# Patient Record
Sex: Female | Born: 1935 | Race: White | Hispanic: No | Marital: Single | Smoking: Former smoker
Health system: Southern US, Community
[De-identification: ages and names within clinical notes are randomized; demographics above are authoritative.]

## PROBLEM LIST (undated history)

## (undated) DIAGNOSIS — I272 Pulmonary hypertension, unspecified: Secondary | ICD-10-CM

## (undated) DIAGNOSIS — I509 Heart failure, unspecified: Secondary | ICD-10-CM

## (undated) DIAGNOSIS — I2510 Atherosclerotic heart disease of native coronary artery without angina pectoris: Secondary | ICD-10-CM

## (undated) DIAGNOSIS — I209 Angina pectoris, unspecified: Secondary | ICD-10-CM

## (undated) DIAGNOSIS — Z8719 Personal history of other diseases of the digestive system: Secondary | ICD-10-CM

## (undated) DIAGNOSIS — R0600 Dyspnea, unspecified: Secondary | ICD-10-CM

## (undated) DIAGNOSIS — I639 Cerebral infarction, unspecified: Secondary | ICD-10-CM

## (undated) DIAGNOSIS — J439 Emphysema, unspecified: Secondary | ICD-10-CM

## (undated) DIAGNOSIS — J189 Pneumonia, unspecified organism: Secondary | ICD-10-CM

## (undated) DIAGNOSIS — I739 Peripheral vascular disease, unspecified: Secondary | ICD-10-CM

## (undated) DIAGNOSIS — E039 Hypothyroidism, unspecified: Secondary | ICD-10-CM

## (undated) DIAGNOSIS — R011 Cardiac murmur, unspecified: Secondary | ICD-10-CM

## (undated) DIAGNOSIS — Z9981 Dependence on supplemental oxygen: Secondary | ICD-10-CM

## (undated) DIAGNOSIS — Z9289 Personal history of other medical treatment: Secondary | ICD-10-CM

## (undated) DIAGNOSIS — D649 Anemia, unspecified: Secondary | ICD-10-CM

## (undated) DIAGNOSIS — R609 Edema, unspecified: Secondary | ICD-10-CM

## (undated) DIAGNOSIS — I10 Essential (primary) hypertension: Secondary | ICD-10-CM

## (undated) DIAGNOSIS — C50919 Malignant neoplasm of unspecified site of unspecified female breast: Secondary | ICD-10-CM

## (undated) DIAGNOSIS — J449 Chronic obstructive pulmonary disease, unspecified: Secondary | ICD-10-CM

## (undated) DIAGNOSIS — M1990 Unspecified osteoarthritis, unspecified site: Secondary | ICD-10-CM

## (undated) DIAGNOSIS — K31819 Angiodysplasia of stomach and duodenum without bleeding: Secondary | ICD-10-CM

## (undated) HISTORY — DX: Atherosclerotic heart disease of native coronary artery without angina pectoris: I25.10

## (undated) HISTORY — DX: Cerebral infarction, unspecified: I63.9

## (undated) HISTORY — DX: Hypothyroidism, unspecified: E03.9

## (undated) HISTORY — DX: Malignant neoplasm of unspecified site of unspecified female breast: C50.919

## (undated) HISTORY — DX: Essential (primary) hypertension: I10

## (undated) HISTORY — DX: Chronic obstructive pulmonary disease, unspecified: J44.9

## (undated) HISTORY — DX: Pulmonary hypertension, unspecified: I27.20

## (undated) HISTORY — DX: Dyspnea, unspecified: R06.00

## (undated) HISTORY — PX: CARDIAC CATHETERIZATION: SHX172

## (undated) HISTORY — DX: Edema, unspecified: R60.9

## (undated) HISTORY — DX: Peripheral vascular disease, unspecified: I73.9

---
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
## 2000-09-16 HISTORY — PX: BREAST LUMPECTOMY: SHX2

## 2003-09-17 HISTORY — PX: CAROTID ENDARTERECTOMY: SUR193

## 2004-08-01 ENCOUNTER — Ambulatory Visit: Payer: Self-pay | Admitting: Internal Medicine

## 2004-08-21 ENCOUNTER — Other Ambulatory Visit: Payer: Self-pay

## 2004-08-21 ENCOUNTER — Inpatient Hospital Stay: Payer: Self-pay

## 2004-08-23 ENCOUNTER — Ambulatory Visit: Payer: Self-pay | Admitting: Internal Medicine

## 2004-09-04 ENCOUNTER — Ambulatory Visit: Payer: Self-pay | Admitting: Family Medicine

## 2004-09-09 ENCOUNTER — Inpatient Hospital Stay: Payer: Self-pay | Admitting: Anesthesiology

## 2004-09-09 ENCOUNTER — Other Ambulatory Visit: Payer: Self-pay

## 2004-09-19 ENCOUNTER — Encounter: Payer: Self-pay | Admitting: Internal Medicine

## 2005-01-31 ENCOUNTER — Ambulatory Visit: Payer: Self-pay | Admitting: Internal Medicine

## 2005-02-14 ENCOUNTER — Ambulatory Visit: Payer: Self-pay | Admitting: Internal Medicine

## 2005-06-03 ENCOUNTER — Ambulatory Visit: Payer: Self-pay | Admitting: General Surgery

## 2005-08-01 ENCOUNTER — Ambulatory Visit: Payer: Self-pay | Admitting: Internal Medicine

## 2005-08-06 ENCOUNTER — Ambulatory Visit: Payer: Self-pay | Admitting: Unknown Physician Specialty

## 2005-08-16 ENCOUNTER — Ambulatory Visit: Payer: Self-pay | Admitting: Internal Medicine

## 2005-09-16 HISTORY — PX: THYROIDECTOMY: SHX17

## 2006-01-03 ENCOUNTER — Ambulatory Visit: Payer: Self-pay

## 2006-01-29 ENCOUNTER — Ambulatory Visit: Payer: Self-pay | Admitting: Internal Medicine

## 2006-02-04 ENCOUNTER — Other Ambulatory Visit: Payer: Self-pay

## 2006-02-04 ENCOUNTER — Ambulatory Visit: Payer: Self-pay | Admitting: Unknown Physician Specialty

## 2006-02-11 ENCOUNTER — Ambulatory Visit: Payer: Self-pay | Admitting: Unknown Physician Specialty

## 2006-06-10 ENCOUNTER — Ambulatory Visit: Payer: Self-pay | Admitting: General Surgery

## 2006-08-01 ENCOUNTER — Ambulatory Visit: Payer: Self-pay | Admitting: Internal Medicine

## 2006-08-16 ENCOUNTER — Ambulatory Visit: Payer: Self-pay | Admitting: Internal Medicine

## 2007-06-16 ENCOUNTER — Ambulatory Visit: Payer: Self-pay | Admitting: Family Medicine

## 2007-08-17 ENCOUNTER — Ambulatory Visit: Payer: Self-pay | Admitting: Internal Medicine

## 2007-08-18 ENCOUNTER — Ambulatory Visit: Payer: Self-pay | Admitting: Internal Medicine

## 2007-08-28 ENCOUNTER — Ambulatory Visit: Payer: Self-pay | Admitting: Internal Medicine

## 2007-09-16 ENCOUNTER — Other Ambulatory Visit: Payer: Self-pay

## 2007-09-16 ENCOUNTER — Inpatient Hospital Stay: Payer: Self-pay | Admitting: Internal Medicine

## 2007-09-17 ENCOUNTER — Ambulatory Visit: Payer: Self-pay | Admitting: Internal Medicine

## 2007-09-19 ENCOUNTER — Ambulatory Visit: Payer: Self-pay | Admitting: Internal Medicine

## 2008-06-20 ENCOUNTER — Ambulatory Visit: Payer: Self-pay | Admitting: Family Medicine

## 2008-07-29 ENCOUNTER — Ambulatory Visit: Payer: Self-pay | Admitting: Internal Medicine

## 2008-08-01 ENCOUNTER — Inpatient Hospital Stay: Payer: Self-pay | Admitting: Internal Medicine

## 2008-08-15 ENCOUNTER — Telehealth (INDEPENDENT_AMBULATORY_CARE_PROVIDER_SITE_OTHER): Payer: Self-pay | Admitting: *Deleted

## 2008-08-18 ENCOUNTER — Ambulatory Visit: Payer: Self-pay | Admitting: Emergency Medicine

## 2008-08-18 DIAGNOSIS — J45909 Unspecified asthma, uncomplicated: Secondary | ICD-10-CM | POA: Insufficient documentation

## 2008-08-18 DIAGNOSIS — J449 Chronic obstructive pulmonary disease, unspecified: Secondary | ICD-10-CM | POA: Insufficient documentation

## 2008-08-18 DIAGNOSIS — J309 Allergic rhinitis, unspecified: Secondary | ICD-10-CM

## 2008-08-18 DIAGNOSIS — I6789 Other cerebrovascular disease: Secondary | ICD-10-CM

## 2008-08-18 DIAGNOSIS — I10 Essential (primary) hypertension: Secondary | ICD-10-CM | POA: Insufficient documentation

## 2008-08-18 DIAGNOSIS — Z853 Personal history of malignant neoplasm of breast: Secondary | ICD-10-CM

## 2008-08-18 LAB — CONVERTED CEMR LAB
Calcium: 8.7 mg/dL (ref 8.4–10.5)
GFR calc Af Amer: 105 mL/min
GFR calc non Af Amer: 87 mL/min
Glucose, Bld: 163 mg/dL — ABNORMAL HIGH (ref 70–99)
Potassium: 4.6 meq/L (ref 3.5–5.1)
Sodium: 141 meq/L (ref 135–145)

## 2008-09-27 ENCOUNTER — Ambulatory Visit: Payer: Self-pay

## 2008-09-27 ENCOUNTER — Encounter: Payer: Self-pay | Admitting: Emergency Medicine

## 2008-09-30 ENCOUNTER — Ambulatory Visit: Payer: Self-pay | Admitting: Internal Medicine

## 2008-11-04 ENCOUNTER — Ambulatory Visit: Payer: Self-pay | Admitting: Emergency Medicine

## 2008-11-22 ENCOUNTER — Ambulatory Visit: Payer: Self-pay

## 2008-11-23 ENCOUNTER — Encounter: Payer: Self-pay | Admitting: Emergency Medicine

## 2008-11-25 ENCOUNTER — Ambulatory Visit: Payer: Self-pay | Admitting: Emergency Medicine

## 2009-02-07 DIAGNOSIS — J438 Other emphysema: Secondary | ICD-10-CM | POA: Insufficient documentation

## 2009-02-07 DIAGNOSIS — I739 Peripheral vascular disease, unspecified: Secondary | ICD-10-CM

## 2009-02-07 DIAGNOSIS — E039 Hypothyroidism, unspecified: Secondary | ICD-10-CM | POA: Insufficient documentation

## 2009-02-07 DIAGNOSIS — R0602 Shortness of breath: Secondary | ICD-10-CM | POA: Insufficient documentation

## 2009-02-07 DIAGNOSIS — I6529 Occlusion and stenosis of unspecified carotid artery: Secondary | ICD-10-CM

## 2009-02-08 ENCOUNTER — Ambulatory Visit: Payer: Self-pay | Admitting: Emergency Medicine

## 2009-02-09 ENCOUNTER — Encounter: Payer: Self-pay | Admitting: Internal Medicine

## 2009-02-09 ENCOUNTER — Ambulatory Visit: Payer: Self-pay | Admitting: Internal Medicine

## 2009-02-09 DIAGNOSIS — R609 Edema, unspecified: Secondary | ICD-10-CM | POA: Insufficient documentation

## 2009-02-09 DIAGNOSIS — R9431 Abnormal electrocardiogram [ECG] [EKG]: Secondary | ICD-10-CM

## 2009-02-12 ENCOUNTER — Encounter: Payer: Self-pay | Admitting: Emergency Medicine

## 2009-03-23 ENCOUNTER — Ambulatory Visit: Payer: Medicare Other | Admitting: Internal Medicine

## 2009-05-18 ENCOUNTER — Ambulatory Visit: Payer: Self-pay | Admitting: Internal Medicine

## 2009-05-19 LAB — CONVERTED CEMR LAB
BUN: 14 mg/dL (ref 6–23)
Chloride: 101 meq/L (ref 96–112)
Potassium: 4.2 meq/L (ref 3.5–5.3)
Sodium: 143 meq/L (ref 135–145)

## 2009-05-25 ENCOUNTER — Ambulatory Visit: Payer: Self-pay | Admitting: Cardiology

## 2009-05-25 ENCOUNTER — Ambulatory Visit (HOSPITAL_COMMUNITY): Admission: RE | Admit: 2009-05-25 | Discharge: 2009-05-25 | Payer: Self-pay | Admitting: Cardiology

## 2009-06-12 ENCOUNTER — Encounter: Payer: Self-pay | Admitting: Internal Medicine

## 2009-06-20 ENCOUNTER — Ambulatory Visit: Payer: Medicare Other | Admitting: Family Medicine

## 2009-06-27 ENCOUNTER — Encounter: Payer: Medicare Other | Admitting: Internal Medicine

## 2009-07-04 ENCOUNTER — Encounter: Payer: Self-pay | Admitting: Internal Medicine

## 2009-07-04 ENCOUNTER — Ambulatory Visit: Payer: Self-pay

## 2009-07-06 ENCOUNTER — Ambulatory Visit: Payer: Self-pay | Admitting: Internal Medicine

## 2009-07-06 DIAGNOSIS — R9389 Abnormal findings on diagnostic imaging of other specified body structures: Secondary | ICD-10-CM

## 2009-07-11 ENCOUNTER — Encounter: Payer: Self-pay | Admitting: Emergency Medicine

## 2009-07-17 ENCOUNTER — Encounter: Payer: Medicare Other | Admitting: Internal Medicine

## 2009-07-25 ENCOUNTER — Ambulatory Visit: Payer: Medicare Other | Admitting: Family Medicine

## 2009-07-27 ENCOUNTER — Ambulatory Visit: Payer: Self-pay | Admitting: Emergency Medicine

## 2009-07-31 ENCOUNTER — Telehealth: Payer: Self-pay | Admitting: Emergency Medicine

## 2009-08-02 ENCOUNTER — Ambulatory Visit: Payer: Medicare Other | Admitting: Family Medicine

## 2009-08-15 ENCOUNTER — Encounter: Payer: Self-pay | Admitting: Emergency Medicine

## 2009-08-16 ENCOUNTER — Encounter: Payer: Medicare Other | Admitting: Internal Medicine

## 2009-08-28 ENCOUNTER — Ambulatory Visit: Payer: Medicare Other | Admitting: Internal Medicine

## 2009-09-06 ENCOUNTER — Encounter: Payer: Self-pay | Admitting: Emergency Medicine

## 2009-09-07 ENCOUNTER — Ambulatory Visit: Payer: Self-pay | Admitting: Internal Medicine

## 2009-09-13 ENCOUNTER — Encounter: Payer: Self-pay | Admitting: Internal Medicine

## 2009-09-13 ENCOUNTER — Ambulatory Visit: Payer: Self-pay | Admitting: Cardiology

## 2009-09-14 LAB — CONVERTED CEMR LAB
BUN: 14 mg/dL (ref 6–23)
CO2: 32 meq/L (ref 19–32)
Chloride: 96 meq/L (ref 96–112)
Creatinine, Ser: 0.73 mg/dL (ref 0.40–1.20)
Glucose, Bld: 96 mg/dL (ref 70–99)
HCT: 44.3 % (ref 36.0–46.0)
Hemoglobin: 14 g/dL (ref 12.0–15.0)
MCHC: 31.6 g/dL (ref 30.0–36.0)
MCV: 92.3 fL (ref 78.0–100.0)
Potassium: 3.9 meq/L (ref 3.5–5.3)
RBC: 4.8 M/uL (ref 3.87–5.11)
WBC: 11.9 10*3/uL — ABNORMAL HIGH (ref 4.0–10.5)

## 2009-09-16 ENCOUNTER — Encounter: Payer: Medicare Other | Admitting: Internal Medicine

## 2009-09-18 ENCOUNTER — Ambulatory Visit: Payer: Self-pay | Admitting: Internal Medicine

## 2009-09-18 ENCOUNTER — Inpatient Hospital Stay (HOSPITAL_BASED_OUTPATIENT_CLINIC_OR_DEPARTMENT_OTHER): Admission: RE | Admit: 2009-09-18 | Discharge: 2009-09-18 | Payer: Self-pay | Admitting: Internal Medicine

## 2009-10-02 ENCOUNTER — Ambulatory Visit: Payer: Self-pay | Admitting: Internal Medicine

## 2009-10-02 DIAGNOSIS — I421 Obstructive hypertrophic cardiomyopathy: Secondary | ICD-10-CM

## 2009-10-02 DIAGNOSIS — I2789 Other specified pulmonary heart diseases: Secondary | ICD-10-CM

## 2009-10-02 DIAGNOSIS — I2510 Atherosclerotic heart disease of native coronary artery without angina pectoris: Secondary | ICD-10-CM | POA: Insufficient documentation

## 2009-10-16 ENCOUNTER — Encounter: Payer: Self-pay | Admitting: Internal Medicine

## 2009-10-17 ENCOUNTER — Encounter: Payer: Medicare Other | Admitting: Internal Medicine

## 2009-11-27 ENCOUNTER — Ambulatory Visit: Payer: Self-pay | Admitting: Pulmonary Disease

## 2009-11-27 DIAGNOSIS — J441 Chronic obstructive pulmonary disease with (acute) exacerbation: Secondary | ICD-10-CM | POA: Insufficient documentation

## 2009-11-28 ENCOUNTER — Encounter: Payer: Self-pay | Admitting: Internal Medicine

## 2009-11-30 ENCOUNTER — Ambulatory Visit: Payer: Self-pay

## 2009-11-30 ENCOUNTER — Encounter: Payer: Self-pay | Admitting: Internal Medicine

## 2010-02-09 ENCOUNTER — Ambulatory Visit: Payer: Medicare Other | Admitting: Family Medicine

## 2010-02-27 ENCOUNTER — Encounter: Payer: Self-pay | Admitting: Cardiovascular Disease

## 2010-03-08 ENCOUNTER — Ambulatory Visit: Payer: Medicare Other | Admitting: Otolaryngology

## 2010-03-27 ENCOUNTER — Ambulatory Visit: Payer: Self-pay | Admitting: Emergency Medicine

## 2010-04-17 ENCOUNTER — Ambulatory Visit: Payer: Self-pay | Admitting: Emergency Medicine

## 2010-05-15 ENCOUNTER — Ambulatory Visit: Payer: Self-pay | Admitting: Emergency Medicine

## 2010-05-22 ENCOUNTER — Telehealth (INDEPENDENT_AMBULATORY_CARE_PROVIDER_SITE_OTHER): Payer: Self-pay | Admitting: *Deleted

## 2010-05-22 ENCOUNTER — Encounter: Admission: RE | Admit: 2010-05-22 | Discharge: 2010-05-22 | Payer: Self-pay | Admitting: Neurology

## 2010-05-23 ENCOUNTER — Ambulatory Visit: Payer: Self-pay | Admitting: Pulmonary Disease

## 2010-06-05 ENCOUNTER — Encounter: Payer: Medicare Other | Admitting: Neurology

## 2010-06-16 ENCOUNTER — Encounter: Payer: Medicare Other | Admitting: Neurology

## 2010-06-28 ENCOUNTER — Ambulatory Visit: Payer: Self-pay | Admitting: Emergency Medicine

## 2010-06-29 ENCOUNTER — Telehealth (INDEPENDENT_AMBULATORY_CARE_PROVIDER_SITE_OTHER): Payer: Self-pay | Admitting: *Deleted

## 2010-06-29 LAB — CONVERTED CEMR LAB
BUN: 14 mg/dL (ref 6–23)
Basophils Absolute: 0 10*3/uL (ref 0.0–0.1)
Creatinine, Ser: 0.6 mg/dL (ref 0.4–1.2)
Eosinophils Relative: 2.7 % (ref 0.0–5.0)
GFR calc non Af Amer: 109.67 mL/min (ref >60)
Glucose, Bld: 96 mg/dL (ref 70–99)
HCT: 33.1 % — ABNORMAL LOW (ref 36.0–46.0)
Lymphs Abs: 2.3 10*3/uL (ref 0.7–4.0)
Monocytes Absolute: 0.6 10*3/uL (ref 0.1–1.0)
Monocytes Relative: 4.6 % (ref 3.0–12.0)
Neutrophils Relative %: 75 % (ref 43.0–77.0)
Platelets: 347 10*3/uL (ref 150.0–400.0)
Potassium: 3.9 meq/L (ref 3.5–5.1)
Pro B Natriuretic peptide (BNP): 96.1 pg/mL (ref 0.0–100.0)
RDW: 16.2 % — ABNORMAL HIGH (ref 11.5–14.6)
Sed Rate: 18 mm/hr (ref 0–22)
WBC: 13.3 10*3/uL — ABNORMAL HIGH (ref 4.5–10.5)

## 2010-07-04 ENCOUNTER — Telehealth (INDEPENDENT_AMBULATORY_CARE_PROVIDER_SITE_OTHER): Payer: Self-pay | Admitting: *Deleted

## 2010-07-05 ENCOUNTER — Telehealth: Payer: Self-pay | Admitting: Adult Health

## 2010-07-17 ENCOUNTER — Encounter: Payer: Medicare Other | Admitting: Neurology

## 2010-08-14 ENCOUNTER — Ambulatory Visit: Payer: Self-pay | Admitting: Emergency Medicine

## 2010-09-19 ENCOUNTER — Ambulatory Visit: Payer: Medicare Other | Admitting: Family Medicine

## 2010-09-21 ENCOUNTER — Ambulatory Visit
Admission: RE | Admit: 2010-09-21 | Discharge: 2010-09-21 | Payer: Self-pay | Source: Home / Self Care | Attending: Emergency Medicine | Admitting: Emergency Medicine

## 2010-10-08 ENCOUNTER — Telehealth (INDEPENDENT_AMBULATORY_CARE_PROVIDER_SITE_OTHER): Payer: Self-pay | Admitting: *Deleted

## 2010-10-09 ENCOUNTER — Ambulatory Visit: Admit: 2010-10-09 | Payer: Self-pay | Admitting: Emergency Medicine

## 2010-10-10 ENCOUNTER — Telehealth: Payer: Self-pay | Admitting: Emergency Medicine

## 2010-10-16 NOTE — Letter (Signed)
Summary: Medical Clearance for [facility]  Medical Clearance for [facility]   Imported By: Maryln Gottron 09/19/2009 10:49:21  _____________________________________________________________________  External Attachment:    Type:   Image     Comment:   External Document

## 2010-10-16 NOTE — Assessment & Plan Note (Signed)
Summary: COPD   Visit Type:  Follow-up Copy to:  [facility] Primary Provider/Referring Provider:  Osborne Oman, [place] Fax [phone]  CC:  f/u. pt states her breathing is better and overall she is feeling better. pt denies any cough. pt c/o occas wheezing. pt quit smoking 1998.Marland Kitchen  History of Present Illness: 83 former smoker, dx with COPD about 1999, also hx breast CA, CVA with R frontal AVM, colonic AVM's. Also diastolic dysfxn followed Dr Gala Romney. Has been treated with Spiriva + Advair, since 2003.  ROV 04/17/10 -- returns for her COPD. Treated for an exacerbation last time with prednisone + azithro, was better while on the pred but quickly with UA irritation, dry cough, runny eyes, wheeze. For a period of time was off her O2, recently dropped to 70's when walking to car.   ROV 05/15/10 -- COPD. Last visit we treated for an exacerbation. She is much improved. Still with wheezing in the morning. Some am dyspnea. Cough better. Using SABA about once daily.   ROV 05/23/10 -- She feels like she caught a cold last week.  She has been more short of breath.  Also getting more cough with yellow sputum.  She has been getting chest tightness and more wheeze.  She has felt warm, but has not checked her temp.  She denies hemoptysis.  She has not had abdominal symptoms or leg swelling.  Her sinuses and throat have been okay.  She has been using her ventolin more.  She has tried a Zpak before, but this does not seem to help.  June 28, 2010 -Presents for a  2 week follow up.   On Prednisone 10mg  1/2 every other day. Seen 06/02/10 for acute COPD exacerbation , tx w/ Levaquin and steroids. She  is only minimally improved with cough and wheeizng. Mucus  thick at times , unable to get up .  Xray last visit was w/ no acute changes.  Has some reflux and heartburn. Denies chest pain, orthopnea, hemoptysis, fever, n/v/d, edema, headache.   ROV 08/14/10 -- COPD, treated w pred taper 06/28/10 for persistent  obstructive symptoms. Has been treated w abx/pred. Now better, in retrospect ? allergies, ? inhaled exposure. Now off Pred. Advair + Spiriva. No flu shot yet. hasn't needed SABA in several days.   Current Medications (verified): 1)  Levoxyl 112 Mcg Tabs (Levothyroxine Sodium) .Marland Kitchen.. 1 By Mouth Daily 2)  Levoxyl 50 Mcg Tabs (Levothyroxine Sodium) .Marland Kitchen.. 1 By Mouth Daily 3)  Lipitor 20 Mg Tabs (Atorvastatin Calcium) .... Take 1 Tablet By Mouth Once A Day 4)  Bisoprolol-Hydrochlorothiazide 5-6.25 Mg Tabs (Bisoprolol-Hydrochlorothiazide) .... Take 1 Tablet By Mouth Once A Day 5)  Fexofenadine Hcl 180 Mg Tabs (Fexofenadine Hcl) .... Take 1 Tablet By Mouth Once A Day 6)  Enablex 7.5 Mg Xr24h-Tab (Darifenacin Hydrobromide) .... Take 1 Tablet By Mouth Once A Day 7)  One-A-Day Extras Antioxidant  Caps (Multiple Vitamins-Minerals) .... Take 1 Tablet By Mouth Once A Day 8)  Calcium 600 1500 Mg Tabs (Calcium Carbonate) .... Take 1 Tablet By Mouth Once A Day 9)  Adult Aspirin Ec Low Strength 81 Mg Tbec (Aspirin) .... Take 1 Tablet By Mouth Once A Day 10)  Furosemide 20 Mg Tabs (Furosemide) .... Take 1 Tablet By Mouth Once A Day 11)  Advair Diskus 250-50 Mcg/dose Misc (Fluticasone-Salmeterol) .... Inhale 1 Puff Two Times A Day 12)  Spiriva Handihaler 18 Mcg Caps (Tiotropium Bromide Monohydrate) .... Inhale Contents of 1 Capsule Once A Day 13)  Ventolin Hfa 108 (90 Base) Mcg/act Aers (Albuterol Sulfate) .... 2 Puffs Every 4 Hours As Needed 14)  Prednisone 10 Mg Tabs (Prednisone) .... 4 Tabs For 3  Days, Then 3 Tabs For 3 Days, 2 Tabs For 3 Days, Then 1 Tab Daily 15)  Albuterol Sulfate (2.5 Mg/29ml) 0.083% Nebu (Albuterol Sulfate) .Marland Kitchen.. 1 Vial Via Hhn Every 4-6 Hrs As Needed Wheezing 16)  Dexilant 60 Mg Cpdr (Dexlansoprazole) .... Take 1 Tablet By Mouth Once A Day  Allergies (verified): 1)  ! * Zpak 2)  ! Adhesive Tape  Vital Signs:  Patient profile:   77 year old female Height:      66.5 inches Weight:       176 pounds O2 Sat:      90 % on 3 L/min Temp:     98.2 degrees F oral Pulse rate:   81 / minute BP sitting:   132 / 78  (left arm) Cuff size:   regular  Vitals Entered By: Michel Bickers CMA (August 14, 2010 3:56 PM)  O2 Flow:  3 L/min CC: f/u. pt states her breathing is better and overall she is feeling better. pt denies any cough. pt c/o occas wheezing. pt quit smoking 1998. Comments meds and allergies updated Michel Bickers CMA  August 14, 2010 3:57 PM    Physical Exam  General:  on supplemental oxygen, ill appearing, dyspneic, but can speak in full sentences Head:  normocephalic and atraumatic Eyes:  not injected  Nose:  clear Mouth:  no exudate Neck:  no JVD.   Lungs:  prolonged exhalation, b/l exp wheezing, no dullness Heart:  regular rhythm and normal rate, 3/6 SM Abdomen:  soft, nontender, no masses, normal bowel sounds Msk:  no deformity or scoliosis noted with normal posture Pulses:  pulses normal Extremities:  no edema or cyanosis Neurologic:  non-focal Skin:  intact without lesions or rashes Cervical Nodes:  no significant adenopathy Psych:  slightly anxious, otherwise normal   Impression & Recommendations:  Problem # 1:  COPD (ICD-496)  Problem # 2:  ALLERGIC RHINITIS (ICD-477.9)  Her updated medication list for this problem includes:    Fexofenadine Hcl 180 Mg Tabs (Fexofenadine hcl) .Marland Kitchen... Take 1 tablet by mouth once a day  Other Orders: Est. Patient Level IV (98119) Influenza Vaccine MCR (14782)  Patient Instructions: 1)  Continue your Spiriva and Advair as you are taking them 2)  Continue your oxygen at 3L/min. 3)  Flu shot today 4)  Follow up with Dr Delton Coombes in 4 months or as needed   Hepatitis B Vaccine # 1 (to be given today)  DPT Vaccine # 1 (to be given today)  HIB Vaccine # 1 (to be given today)  Hepatitis A Vaccine # 1 (to be given today)

## 2010-10-16 NOTE — Assessment & Plan Note (Signed)
Summary: post  cath   Visit Type:  post cath Referring Provider:  [facility] Primary Provider:  Osborne Oman, [place] Fax [phone]  CC:  no changes.  History of Present Illness: [name] is a delightful 77 year old woman with a history of COPD, hypertension, hyperlipidemia, and peripheral arterial disease.  She is status post multiple CVAs in the setting of a previous left carotid stenosis, which she has underwent endarterectomy before at [facility] in 2005. She denies any history of known coronary artery disease. She does have an abnormal ECG with inferior and lateral Q waves but Myoview and echo have been normal.  She has had ABIS, ab u/s and carotid u/s. ABIs and abdominal u/s were normal. Carotids showed 60-79% R and 40-59% on L. Has been seen at [facility] neurology. Are continuing with watchful waiting as she is asx.  She returns today for post-cath f/u. Cath showed Mild non-obs CAD with  ~50% ostial RCA lesion. Left system normal. EF 70%. Mild PAH. Right atrial pressure mean of 8, RV pressure 43/6 with an EDP of 14, PA pressure 42/17 with a mean of 29, pulmonary capillary wedge pressure was mean of 15.  LV was 162/10 with an EDP of 20. Central aortic pressure 154/70 with a mean of 101.  Fick cardiac output 5.0 liters per minute.  Cardiac index 2.6 liters per minute per meter squared.  Pulmonary vascular resistance was 2.8 Woods units.  Still with some mild pain under L breast no change. Chronic dyspnea with mild to moderate activity. No orthopnea or PND. Had som bruising around cath site. No edema. No syncope/presyncope.  Current Medications (verified): 1)  Levoxyl 150 Mcg Tabs (Levothyroxine Sodium) .... Take 1 Tablet By Mouth Once A Day 2)  Lipitor 20 Mg Tabs (Atorvastatin Calcium) .... Take 1 Tablet By Mouth Once A Day 3)  Bisoprolol-Hydrochlorothiazide 5-6.25 Mg Tabs (Bisoprolol-Hydrochlorothiazide) .... Take 1 Tablet By Mouth Once A Day 4)  Fexofenadine Hcl 180 Mg Tabs (Fexofenadine  Hcl) .... Take 1 Tablet By Mouth Once A Day 5)  Enablex 7.5 Mg Xr24h-Tab (Darifenacin Hydrobromide) .... Take 1 Tablet By Mouth Once A Day 6)  One-A-Day Extras Antioxidant  Caps (Multiple Vitamins-Minerals) .... Take 1 Tablet By Mouth Once A Day 7)  Calcium 600 1500 Mg Tabs (Calcium Carbonate) .... Take 1 Tablet By Mouth Once A Day 8)  Adult Aspirin Ec Low Strength 81 Mg Tbec (Aspirin) .... Take 1 Tablet By Mouth Once A Day 9)  Furosemide 20 Mg Tabs (Furosemide) .... Take 1 Tablet By Mouth Once A Day 10)  Advair Diskus 250-50 Mcg/dose Misc (Fluticasone-Salmeterol) .... Inhale 1 Puff Two Times A Day 11)  Spiriva Handihaler 18 Mcg Caps (Tiotropium Bromide Monohydrate) .... Inhale Contents of 1 Capsule Once A Day  Allergies (verified): 1)  ! Adhesive Tape  Past History:  Past Medical History: Last updated: 07/06/2009 1. COPD on home O2 2. Carotid Stenosis with TIAs   --s/p L CEA   --Carotid u/s 1/10: L 60-79% R 40-69%   --ABIs and ab u/s: Normal 3. Abnormal ECG     --Myoview in [facility] [place] EF 74% normal perfusion     --Echo EF 60-65% no wall motion abnormalities. Mild TR with peak RVSP 39 4. HTN 5. Chonic dyspnea 6. Edema 7. Thyroid disease 8. h/o breast CA 9. allergic rhinitis 10. Cardiac MRI: EF normal thickened septum with mild SAM  no scar or infiltrate   Vital Signs:  Patient profile:   77 year old female  Height:      66.5 inches Weight:      172.50 pounds BMI:     27.52 Pulse rate:   80 / minute Pulse rhythm:   regular BP sitting:   138 / 58  (left arm) Cuff size:   regular  Vitals Entered By: Mercer Pod (October 02, 2009 2:27 PM)  Physical Exam  General:  General:  Gen: well appearing. no resp difficulty HEENT: normal. Neck: supple. no JVD. Carotids 2+ bilat; + bilat bruits. No lymphadenopathy or thryomegaly appreciated. +thyroid scar and CEA scar on L Cor: PMI nondisplaced. Regular rate & rhythm. No rubs, gallops, 3/6 systolic murmur RSB slightly  worse with valsalva. heard across precordium Lungs: clear with dec air movement Abdomen: soft, nontender, nondistended. No hepatosplenomegaly. No bruits or masses. Good bowel sounds. Extremities: + cyanosis/clubbing, rash, no edema. +ecchymosis on R groin Neuro: alert & orientedx3, cranial nerves grossly intact. moves all 4 extremities w/o difficulty. affect pleasant    Impression & Recommendations:  Problem # 1:  CAD, NATIVE VESSEL (ICD-414.01) Mild non-obstructive. Continue ASA and risk factor management.  Problem # 2:  PULMONARY HYPERTENSION, SECONDARY (ICD-416.8) Mild. Emphasized need to be compliant with O2.  Problem # 3:  HYPERTROPHIC OBSTRUCTIVE CARDIOMYOPATHY (ICD-425.1) Stable. Relatively asymptomatic will need to be careful with diuretics. Repeat echo 1 year.

## 2010-10-16 NOTE — Assessment & Plan Note (Signed)
Summary: COPD   Visit Type:  Follow-up Copy to:  [facility] Primary Provider/Referring Provider:  Osborne Oman, [place] Fax [phone]  CC:  COPD.  The patient says her breathing is much better...no cough...morning wheezing...increased heartburn.  History of Present Illness: 77 former smoker, dx with COPD about 1999, also hx breast CA, CVA with R frontal AVM, colonic AVM's. Has been treated with Spiriva + Advair, since 2003.   ROV 02/08/09 -- Still with some dyspnea when she walks and talks. Overnight oximetry shows no desat on 2L/min, doubt OSA. No flares since last visit, no abx or pred. A bit more edema in LE than last visit. She doesn't cough freq, occas wheze with exertion.   ROV 07/27/09 -- Returns for regular follow up of COPD. Has continued to follow with Dr Gala Romney, She has hypertrophic cardiac dysfxn due to diastolic dysfxn not outflow tract obstruction. For the last week or so has had more cough, non-productive, no F/C, no CP but has had chest tightness. Going to pulm rehab at [place], feels that she may be a bit stronger but no large change. No exacerbations since last visit, no hospitalizations.   ROV 03/27/10 -- f/u for her COPD. She reports that she has had an acute worsening in SOB, evolving a cough. This weekend she "couldn't do anything", couldn't talk. Cough is non-productive. Has been using her SABA more frequently. She has been desaturated on 2L/'min pulsed to as low as 70's. Today was 88% on 2L/min.   ROV 04/17/10 -- returns for her COPD. Treated for an exacerbation last time with prednisone + azithro, was better while on the pred but quickly with UA irritation, dry cough, runny eyes, wheeze. For a period of time was off her O2, recently dropped to 70's when walking to car.   ROV 05/15/10 -- COPD. Last visit we treated for an exacerbation. She is much improved. Still with wheezing in the morning. Some am dyspnea. Cough better. Using SABA about once daily.   Current  Medications (verified): 1)  Levoxyl 112 Mcg Tabs (Levothyroxine Sodium) .Marland Kitchen.. 1 By Mouth Daily 2)  Levoxyl 50 Mcg Tabs (Levothyroxine Sodium) .Marland Kitchen.. 1 By Mouth Daily 3)  Lipitor 20 Mg Tabs (Atorvastatin Calcium) .... Take 1 Tablet By Mouth Once A Day 4)  Bisoprolol-Hydrochlorothiazide 5-6.25 Mg Tabs (Bisoprolol-Hydrochlorothiazide) .... Take 1 Tablet By Mouth Once A Day 5)  Fexofenadine Hcl 180 Mg Tabs (Fexofenadine Hcl) .... Take 1 Tablet By Mouth Once A Day 6)  Enablex 7.5 Mg Xr24h-Tab (Darifenacin Hydrobromide) .... Take 1 Tablet By Mouth Once A Day 7)  One-A-Day Extras Antioxidant  Caps (Multiple Vitamins-Minerals) .... Take 1 Tablet By Mouth Once A Day 8)  Calcium 600 1500 Mg Tabs (Calcium Carbonate) .... Take 1 Tablet By Mouth Once A Day 9)  Adult Aspirin Ec Low Strength 81 Mg Tbec (Aspirin) .... Take 1 Tablet By Mouth Once A Day 10)  Furosemide 20 Mg Tabs (Furosemide) .... Take 1 Tablet By Mouth Once A Day 11)  Advair Diskus 250-50 Mcg/dose Misc (Fluticasone-Salmeterol) .... Inhale 1 Puff Two Times A Day 12)  Spiriva Handihaler 18 Mcg Caps (Tiotropium Bromide Monohydrate) .... Inhale Contents of 1 Capsule Once A Day  Allergies (verified): 1)  ! * Zpak 2)  ! Adhesive Tape  Vital Signs:  Patient profile:   77 year old female Height:      66.5 inches (168.91 cm) Weight:      176.25 pounds (80.11 kg) BMI:     28.12 O2 Sat:  97 % on 3 L/min pulsed Temp:     98.1 degrees F (36.72 degrees C) oral Pulse rate:   80 / minute BP sitting:   110 / 68  (left arm) Cuff size:   regular  Vitals Entered By: Michel Bickers CMA (May 15, 2010 2:51 PM)  O2 Sat at Rest %:  97 O2 Flow:  3 L/min pulsed CC: COPD.  The patient says her breathing is much better...no cough...morning wheezing...increased heartburn Comments Medications reviewed with the patient. Daytime phone verified. Michel Bickers CMA  May 15, 2010 2:54 PM   Physical Exam  General:  wd female in nad Head:  normocephalic and  atraumatic Eyes:  not injected  Nose:  clear drainage Mouth:  no deformity or lesions Neck:  significant insp stridor Lungs:  markedly improved, still some very mild end exp wheeze.  Heart:  rrr, loud 4/6 sem Abdomen:  benign Msk:  no deformity or scoliosis noted with normal posture Extremities:  no edema or cyanosis Neurologic:  alert and oriented, moves all 4. Skin:  intact without lesions or rashes Psych:  alert and cooperative; normal mood and affect; normal attention span and concentration   Impression & Recommendations:  Problem # 1:  COPD (ICD-496) With recent exac, improved.   Other Orders: Est. Patient Level IV (55732)  Patient Instructions: 1)  Continue your inhaled medications as you are taking them  2)  Use your oxygen at all times 3)  Get the flu shot in the Fall 4)  Follow up with Dr Delton Coombes in 4 months or as needed

## 2010-10-16 NOTE — Progress Notes (Signed)
Summary: rx request fro dexilant  Phone Note Call from Patient Call back at Home Phone [phone]   Caller: Cecelia Byars- pt's niece Call For: tammy parrett Summary of Call: pt was recently given samples of dexilant- works well. requests rx called in to: [facility] in [place], [place]. call pt's home # and speak tp pt or call melissa at [phone] Initial call taken by: Tivis Ringer, CNA,  July 05, 2010 8:44 AM  Follow-up for Phone Call        spoke with pt, sates dexilant is working well and wants rx sent to pharmacy. rx sent pt aware.Carron Curie CMA  July 05, 2010 10:00 AM     New/Updated Medications: DEXILANT 60 MG CPDR (DEXLANSOPRAZOLE) Take 1 tablet by mouth once a day Prescriptions: DEXILANT 60 MG CPDR (DEXLANSOPRAZOLE) Take 1 tablet by mouth once a day  #30 x 6   Entered by:   Carron Curie CMA   Authorized by:   Rubye Oaks NP   Signed by:   Carron Curie CMA on 07/05/2010   Method used:   Electronically to        [facility] (retail)       [address]       [place], [place]  [address]       Ph: [phone]       Fax: [phone]   RxID:   [id number]

## 2010-10-16 NOTE — Assessment & Plan Note (Signed)
Summary: COPD exacerbation   Visit Type:  Follow-up Copy to:  [facility] Primary Provider/Referring Provider:  Osborne Oman, [place] Fax [phone]  CC:  PAH and COPD follow-up.  The patient c/o increased sob with exertion and at rest. She has noticed a big decline in the last 2 weeks.Marland Kitchen  History of Present Illness: 60 former smoker, dx with COPD about 1999, also hx breast CA, CVA with R frontal AVM, colonic AVM's. Has been treated with Spiriva + Advair, since 2003.   ROV 02/08/09 -- Still with some dyspnea when she walks and talks. Overnight oximetry shows no desat on 2L/min, doubt OSA. No flares since last visit, no abx or pred. A bit more edema in LE than last visit. She doesn't cough freq, occas wheze with exertion.   ROV 07/27/09 -- Returns for regular follow up of COPD. Has continued to follow with Dr Gala Romney, She has hypertrophic cardiac dysfxn due to diastolic dysfxn not outflow tract obstruction. For the last week or so has had more cough, non-productive, no F/C, no CP but has had chest tightness. Going to pulm rehab at [place], feels that she may be a bit stronger but no large change. No exacerbations since last visit, no hospitalizations.   Acute visit - 3/11:   ROV 03/27/10 -- f/u for her COPD. She reports that she has had an acute worsening in SOB, evolving a cough. This weekend she "couldn't do anything", couldn't talk. Cough is non-productive. Has been using her SABA more frequently. She has been desaturated on 2L/'min pulsed to as low as 70's. Today was 88% on 2L/min.   Current Medications (verified): 1)  Levoxyl 150 Mcg Tabs (Levothyroxine Sodium) .... Take 1 Tablet By Mouth Once A Day 2)  Lipitor 20 Mg Tabs (Atorvastatin Calcium) .... Take 1 Tablet By Mouth Once A Day 3)  Bisoprolol-Hydrochlorothiazide 5-6.25 Mg Tabs (Bisoprolol-Hydrochlorothiazide) .... Take 1 Tablet By Mouth Once A Day 4)  Fexofenadine Hcl 180 Mg Tabs (Fexofenadine Hcl) .... Take 1 Tablet By Mouth  Once A Day 5)  Enablex 7.5 Mg Xr24h-Tab (Darifenacin Hydrobromide) .... Take 1 Tablet By Mouth Once A Day 6)  One-A-Day Extras Antioxidant  Caps (Multiple Vitamins-Minerals) .... Take 1 Tablet By Mouth Once A Day 7)  Calcium 600 1500 Mg Tabs (Calcium Carbonate) .... Take 1 Tablet By Mouth Once A Day 8)  Adult Aspirin Ec Low Strength 81 Mg Tbec (Aspirin) .... Take 1 Tablet By Mouth Once A Day 9)  Furosemide 20 Mg Tabs (Furosemide) .... Take 1 Tablet By Mouth Once A Day 10)  Advair Diskus 250-50 Mcg/dose Misc (Fluticasone-Salmeterol) .... Inhale 1 Puff Two Times A Day 11)  Spiriva Handihaler 18 Mcg Caps (Tiotropium Bromide Monohydrate) .... Inhale Contents of 1 Capsule Once A Day  Allergies (verified): 1)  ! Adhesive Tape  Vital Signs:  Patient profile:   77 year old female Height:      66.5 inches Weight:      178 pounds O2 Sat:      87 % on 2 L/min Temp:     97.9 degrees F oral Pulse rate:   88 / minute BP sitting:   120 / 60  (right arm) Cuff size:   regular  Vitals Entered By: Michel Bickers CMA (March 27, 2010 2:28 PM)  O2 Sat at Rest %:  87 O2 Flow:  2 L/min  O2 Sat Comments Patients sats came up to 91% on 2 liters after resting for 3-4 minutes. Pulse was 88.Michel Bickers CMA  March 27, 2010 2:34 PM  Physical Exam  General:  wd female in nad Head:  normocephalic and atraumatic Eyes:  conjunctiva and sclera clear Nose:  patent without discharge, no purulence Mouth:  no deformity or lesions Neck:  no stridor Lungs:  Bilateral diffuse exp wheezes.  Heart:  rrr, loud 4/6 sem Abdomen:  benign Msk:  no deformity or scoliosis noted with normal posture Extremities:  no edema or cyanosis Neurologic:  alert and oriented, moves all 4. Skin:  intact without lesions or rashes Psych:  alert and cooperative; normal mood and affect; normal attention span and concentration   Impression & Recommendations:  Problem # 1:  CHRONIC OBSTRUCTIVE PULMONARY DISEASE, ACUTE EXACERBATION  (ICD-491.21) - pred taper + azithro - titrate o2 up to 3L/min - rov 2 weeks  Orders: Est. Patient Level IV (27253) Prescription Created Electronically [phone]) DME Referral (DME)  Problem # 2:  HYPERTROPHIC OBSTRUCTIVE CARDIOMYOPATHY (ICD-425.1)  Problem # 3:  PULMONARY HYPERTENSION, SECONDARY (ICD-416.8)  Medications Added to Medication List This Visit: 1)  Prednisone 10 Mg Tabs (Prednisone) .... 40mg  daily x 3days, 30mg  daily x 3days, 20mg  daily x 3days, 10mg  daily x3 days then stop. 2)  Azithromycin 250 Mg Tabs (Azithromycin) .... 2 on the first day, then 1 by mouth once daily  Patient Instructions: 1)  We will start Prednisone to taper as directed 2)  Take azithromycin for 5 days. 3)  Continue your inhaled medications as you are taking them.  4)  We will ask [employer] to assess your oxygen and try to get you an extra tank at home 5)  Follow up with Dr Delton Coombes in 10-14 days.  6)  CALL OUR OFFICE if your breathing worsens in any way.  Prescriptions: AZITHROMYCIN 250 MG TABS (AZITHROMYCIN) 2 on the first day, then 1 by mouth once daily  #6 x 0   Entered and Authorized by:   Leslye Peer MD   Signed by:   Leslye Peer MD on 03/27/2010   Method used:   Electronically to        [facility] (retail)       [address]       [place], [place]  [address]       Ph: [phone]       Fax: [phone]   RxID:   [id number] PREDNISONE 10 MG TABS (PREDNISONE) 40mg  daily x 3days, 30mg  daily x 3days, 20mg  daily x 3days, 10mg  daily x3 days then stop.  #30 x 0   Entered and Authorized by:   Leslye Peer MD   Signed by:   Leslye Peer MD on 03/27/2010   Method used:   Electronically to        [facility] (retail)       [address]       [place], [place]  [address]       Ph: [phone]       Fax: [phone]   RxID:   [id number]

## 2010-10-16 NOTE — Progress Notes (Signed)
Summary: returning call---lab results  Phone Note Call from Patient Call back at Home Phone [phone]   Caller: Patient Call For: parrett Summary of Call: Returning Bowman R's call. Initial call taken by: Darletta Moll,  June 29, 2010 10:40 AM  Follow-up for Phone Call        called and spoke with pt and informed her of lab results from 06/28/2010.  nothing further needed. Arman Filter LPN  June 29, 2010 11:18 AM

## 2010-10-16 NOTE — Progress Notes (Signed)
Summary: talk to nurse---increased sob/wheezing...appt with VS  Phone Note Call from Patient Call back at [phone] or [phone]   Caller: Other Relative Efraim Kaufmann ( niece) Call For: byrum Summary of Call: pt coming in to see tammy. niece want pt to be seen sooner. Initial call taken by: Rickard Patience,  May 22, 2010 4:49 PM  Follow-up for Phone Call        called and spoke with pt's niece, Efraim Kaufmann.  Melissa states pt was recently seen by RB on 05/15/2010.  Pt now c/o increased sob, wheezing and a productive cough but unsure what color the sputum is.  Niece concerned pt may be having an exacerbation.  pt has had to increase rescue hfa use to 4 times daily.  pt is scheduled to see TP next week but would like to be seen sooner.  Therefore scheduled pt with VS tomorrow at 2pm.  Niece verbalized understanding and will inform pt of appt date and time.  Aundra Millet Reynolds LPN  May 22, 2010 4:58 PM

## 2010-10-16 NOTE — Letter (Signed)
Summary: record release  record release   Imported By: Frazier Butt Chriscoe 02/27/2010 09:25:02  _____________________________________________________________________  External Attachment:    Type:   Image     Comment:   External Document

## 2010-10-16 NOTE — Assessment & Plan Note (Signed)
Summary: COPD, UA noise   Visit Type:  Follow-up Copy to:  [facility] Primary Provider/Referring Provider:  Osborne Oman, [place] Fax [phone]  CC:  COPD.  PAH.  The patient c/o increased SOB x3-4 days...nonprod cough...audible wheezing.Marland Kitchen  History of Present Illness: 44 former smoker, dx with COPD about 1999, also hx breast CA, CVA with R frontal AVM, colonic AVM's. Has been treated with Spiriva + Advair, since 2003.   ROV 02/08/09 -- Still with some dyspnea when she walks and talks. Overnight oximetry shows no desat on 2L/min, doubt OSA. No flares since last visit, no abx or pred. A bit more edema in LE than last visit. She doesn't cough freq, occas wheze with exertion.   ROV 07/27/09 -- Returns for regular follow up of COPD. Has continued to follow with Dr Gala Romney, She has hypertrophic cardiac dysfxn due to diastolic dysfxn not outflow tract obstruction. For the last week or so has had more cough, non-productive, no F/C, no CP but has had chest tightness. Going to pulm rehab at [place], feels that she may be a bit stronger but no large change. No exacerbations since last visit, no hospitalizations.   Acute visit - 3/11:   ROV 03/27/10 -- f/u for her COPD. She reports that she has had an acute worsening in SOB, evolving a cough. This weekend she "couldn't do anything", couldn't talk. Cough is non-productive. Has been using her SABA more frequently. She has been desaturated on 2L/'min pulsed to as low as 70's. Today was 88% on 2L/min.   ROV 04/17/10 -- returns for her COPD. Treated for an exacerbation last time with prednisone + azithro, was better while on the pred but quickly with UA irritation, dry cough, runny eyes, wheeze. For a period of time was off her O2, recently dropped to 70's when walking to car.   Current Medications (verified): 1)  Levoxyl 112 Mcg Tabs (Levothyroxine Sodium) .Marland Kitchen.. 1 By Mouth Daily 2)  Levoxyl 50 Mcg Tabs (Levothyroxine Sodium) .Marland Kitchen.. 1 By Mouth Daily 3)   Lipitor 20 Mg Tabs (Atorvastatin Calcium) .... Take 1 Tablet By Mouth Once A Day 4)  Bisoprolol-Hydrochlorothiazide 5-6.25 Mg Tabs (Bisoprolol-Hydrochlorothiazide) .... Take 1 Tablet By Mouth Once A Day 5)  Fexofenadine Hcl 180 Mg Tabs (Fexofenadine Hcl) .... Take 1 Tablet By Mouth Once A Day 6)  Enablex 7.5 Mg Xr24h-Tab (Darifenacin Hydrobromide) .... Take 1 Tablet By Mouth Once A Day 7)  One-A-Day Extras Antioxidant  Caps (Multiple Vitamins-Minerals) .... Take 1 Tablet By Mouth Once A Day 8)  Calcium 600 1500 Mg Tabs (Calcium Carbonate) .... Take 1 Tablet By Mouth Once A Day 9)  Adult Aspirin Ec Low Strength 81 Mg Tbec (Aspirin) .... Take 1 Tablet By Mouth Once A Day 10)  Furosemide 20 Mg Tabs (Furosemide) .... Take 1 Tablet By Mouth Once A Day 11)  Advair Diskus 250-50 Mcg/dose Misc (Fluticasone-Salmeterol) .... Inhale 1 Puff Two Times A Day 12)  Spiriva Handihaler 18 Mcg Caps (Tiotropium Bromide Monohydrate) .... Inhale Contents of 1 Capsule Once A Day  Allergies (verified): 1)  ! * Zpak 2)  ! Adhesive Tape  Vital Signs:  Patient profile:   77 year old female Height:      66.5 inches (168.91 cm) Weight:      176 pounds (80 kg) BMI:     28.08 O2 Sat:      80 % on 3 L/min pulsed Temp:     97.5 degrees F (36.39 degrees C) oral Pulse rate:  91 / minute BP sitting:   120 / 76  (right arm) Cuff size:   regular  Vitals Entered By: Michel Bickers CMA (April 17, 2010 3:04 PM)  O2 Sat at Rest %:  80 O2 Flow:  3 L/min pulsed  O2 Sat Comments Patients sats after walking to the exam room was 80% on 3 liters pulsed oxygen. After resting approx 2-3 minutes her sats came up to 96% on 3 liters pulsed oxygen and her pulse was 88. Michel Bickers CMA  April 17, 2010 3:06 PM CC: COPD.  PAH.  The patient c/o increased SOB x3-4 days...nonprod cough...audible wheezing. Comments Medications reviewed. Daytime phone verified. Michel Bickers CMA  April 17, 2010 3:08 PM   Physical Exam  General:  wd  female in nad Head:  normocephalic and atraumatic Eyes:  injected, runny eyes Nose:  clear drainage Mouth:  no deformity or lesions Neck:  significant insp stridor Lungs:  Bilateral diffuse exp wheezes.  Heart:  rrr, loud 4/6 sem Abdomen:  benign Msk:  no deformity or scoliosis noted with normal posture Extremities:  no edema or cyanosis Neurologic:  alert and oriented, moves all 4. Skin:  intact without lesions or rashes Psych:  alert and cooperative; normal mood and affect; normal attention span and concentration   Impression & Recommendations:  Problem # 1:  CHRONIC OBSTRUCTIVE PULMONARY DISEASE, ACUTE EXACERBATION (ICD-491.21) Appears to have an upper airway component, but she is wheezing. Not clear to me why the quick decompendsation after pred, but she has signs consistent with URI.  - longer pred taper - depomedrol x1 - levaquin (at her request, actually having no sputum) Orders: Est. Patient Level IV (32202)  Medications Added to Medication List This Visit: 1)  Levoxyl 112 Mcg Tabs (Levothyroxine sodium) .Marland Kitchen.. 1 by mouth daily 2)  Levoxyl 50 Mcg Tabs (Levothyroxine sodium) .Marland Kitchen.. 1 by mouth daily 3)  Prednisone 20 Mg Tabs (Prednisone) .... 3 once daily x 5 days, then 2 once daily x 5 days, then 1.5 once daily x 5 days then 1 once daily x 5 days, then 0.5 once daily x 5 days 4)  Levaquin 500 Mg Tabs (Levofloxacin) .Marland Kitchen.. 1 by mouth once daily  Patient Instructions: 1)  Continue your inhaled meds as you are taking them 2)  Depo-medrol shot today 3)  Prednisone taper 4)  Levaquin for 1 week 5)  Oxygen at 3L/min  6)  Follow up with Dr Delton Coombes in 3 weeks Prescriptions: LEVAQUIN 500 MG TABS (LEVOFLOXACIN) 1 by mouth once daily  #7 x 0   Entered and Authorized by:   Leslye Peer MD   Signed by:   Leslye Peer MD on 04/17/2010   Method used:   Electronically to        [facility] (retail)       [address]       [place], [place]   [address]       Ph: [phone]       Fax: [phone]   RxID:   [id number] PREDNISONE 20 MG TABS (PREDNISONE) 3 once daily x 5 days, then 2 once daily x 5 days, then 1.5 once daily x 5 days then 1 once daily x 5 days, then 0.5 once daily x 5 days  #40 x 0   Entered and Authorized by:   Leslye Peer MD   Signed by:   Leslye Peer MD on  04/17/2010   Method used:   Electronically to        [facility], [facility] (retail)       [address]       [place], [place]  [address]       Ph: [phone]       Fax: [phone]   RxID:   [id number]   Appended Document: COPD, UA noise    Clinical Lists Changes  Orders: Added new Service order of Admin of Therapeutic Inj  intramuscular or subcutaneous (73220) - Signed Added new Service order of Depo- Medrol 80mg  (J1040) - Signed       Medication Administration  Injection # 1:    Medication: Depo- Medrol 80mg     Diagnosis: CHRONIC OBSTRUCTIVE PULMONARY DISEASE, ACUTE EXACERBATION (ICD-491.21)    Route: IM    Site: L deltoid    Exp Date: 12/2012    Lot #: [id number]    Mfr: Pharmacia    Patient tolerated injection without complications    Given by: Michel Bickers CMA (April 17, 2010 3:56 PM)  Orders Added: 1)  Admin of Therapeutic Inj  intramuscular or subcutaneous [96372] 2)  Depo- Medrol 80mg  [J1040]

## 2010-10-16 NOTE — Assessment & Plan Note (Signed)
Summary: acute sick visit for copd exacerbation   Copy to:  [facility] Primary Provider/Referring Provider:  Osborne Oman, [place] Fax [phone]  CC:  Pt is here for a sick visit.  Pt is Dr. Kavin Leech pt.  Symptoms started 2 to 3 weeks.  Pt c/o  productive and nonproductive cough and increased sob with exertion and talking and nasal congestion. Marland Kitchen  History of Present Illness: the pt comes in today for an acute sick visit.  She has known moderate emphysema, and is normally followed by Dr. Delton Coombes.  She comes in with a 2+week h/o worsening sob, chest congestion, cough but no purulence.  She feels that postnasal drip is the cause of the cough, but admits that she feels her chest is "full".    Denies any f/c/s.  No purulence from nares.  Medications Prior to Update: 1)  Levoxyl 150 Mcg Tabs (Levothyroxine Sodium) .... Take 1 Tablet By Mouth Once A Day 2)  Lipitor 20 Mg Tabs (Atorvastatin Calcium) .... Take 1 Tablet By Mouth Once A Day 3)  Bisoprolol-Hydrochlorothiazide 5-6.25 Mg Tabs (Bisoprolol-Hydrochlorothiazide) .... Take 1 Tablet By Mouth Once A Day 4)  Fexofenadine Hcl 180 Mg Tabs (Fexofenadine Hcl) .... Take 1 Tablet By Mouth Once A Day 5)  Enablex 7.5 Mg Xr24h-Tab (Darifenacin Hydrobromide) .... Take 1 Tablet By Mouth Once A Day 6)  One-A-Day Extras Antioxidant  Caps (Multiple Vitamins-Minerals) .... Take 1 Tablet By Mouth Once A Day 7)  Calcium 600 1500 Mg Tabs (Calcium Carbonate) .... Take 1 Tablet By Mouth Once A Day 8)  Adult Aspirin Ec Low Strength 81 Mg Tbec (Aspirin) .... Take 1 Tablet By Mouth Once A Day 9)  Furosemide 20 Mg Tabs (Furosemide) .... Take 1 Tablet By Mouth Once A Day 10)  Advair Diskus 250-50 Mcg/dose Misc (Fluticasone-Salmeterol) .... Inhale 1 Puff Two Times A Day 11)  Spiriva Handihaler 18 Mcg Caps (Tiotropium Bromide Monohydrate) .... Inhale Contents of 1 Capsule Once A Day  Allergies (verified): 1)  ! Adhesive Tape  Review of Systems       The patient  complains of shortness of breath with activity, productive cough, non-productive cough, and nasal congestion/difficulty breathing through nose.  The patient denies shortness of breath at rest, coughing up blood, chest pain, irregular heartbeats, acid heartburn, indigestion, loss of appetite, weight change, abdominal pain, difficulty swallowing, sore throat, tooth/dental problems, headaches, sneezing, itching, ear ache, anxiety, depression, hand/feet swelling, joint stiffness or pain, rash, change in color of mucus, and fever.    Vital Signs:  Patient profile:   77 year old female Height:      66.5 inches Weight:      173.13 pounds BMI:     27.62 O2 Sat:      91 % on Room air Temp:     97.6 degrees F oral Pulse rate:   72 / minute BP sitting:   104 / 62  (left arm) Cuff size:   regular  Vitals Entered By: Arman Filter LPN (November 27, 2009 11:45 AM)  O2 Flow:  Room air CC: Pt is here for a sick visit.  Pt is Dr. Kavin Leech pt.  Symptoms started 2 to 3 weeks.  Pt c/o  productive and nonproductive cough, increased sob with exertion and talking and nasal congestion.  Comments Medications reviewed with patient Arman Filter LPN  November 27, 2009 11:45 AM    Physical Exam  General:  wd female in nad Nose:  patent without discharge, no purulence Lungs:  decreased bs throughout, with both upper and lower airway wheezing. Heart:  rrr, loud 4/6 sem Extremities:  no edema or cyanosis Neurologic:  alert and oriented, moves all 4.   Impression & Recommendations:  Problem # 1:  CHRONIC OBSTRUCTIVE PULMONARY DISEASE, ACUTE EXACERBATION (ICD-491.21)  the pt appears to be having a copd exacerbation, but it is unclear if she has a bacterial infection.  I think she needs a course of prednisone to get her thru this, and I have also given her a prescription for an antibiotic to hold and fill if her congestion worsens or if she begins to cough up purulent mucus.  She is to call if not  improving.  Medications Added to Medication List This Visit: 1)  Prednisone 10 Mg Tabs (Prednisone) .... Take 4 each day for 2 days, then 3 each day for 2 days, then 2 each day for 2 days, then 1 each day for 2 days, then stop 2)  Cefdinir 300 Mg Caps (Cefdinir) .... 2 each am for 5 days.  Other Orders: Est. Patient Level IV (30865)  Patient Instructions: 1)  take mucinex dm extra strength one in am and pm 2)  take prednisone taper as directed. 3)  if congestion is worsening, or if mucus becomes discolored, start on antibiotic. 4)  followup with Dr. Delton Coombes next 4-8 weeks, but call if not improving.   Prescriptions: CEFDINIR 300 MG CAPS (CEFDINIR) 2 each am for 5 days.  #10 x 0   Entered and Authorized by:   Barbaraann Share MD   Signed by:   Barbaraann Share MD on 11/27/2009   Method used:   Print then Give to Patient   RxID:   [id number] PREDNISONE 10 MG  TABS (PREDNISONE) take 4 each day for 2 days, then 3 each day for 2 days, then 2 each day for 2 days, then 1 each day for 2 days, then stop  #qs x 0   Entered and Authorized by:   Barbaraann Share MD   Signed by:   Barbaraann Share MD on 11/27/2009   Method used:   Print then Give to Patient   RxID:   [id number]

## 2010-10-16 NOTE — Assessment & Plan Note (Signed)
Summary: increased sob/RB pt/mg   Visit Type:  Acute visit Copy to:  [facility] Primary Provider/Referring Provider:  Osborne Oman, [place] Fax [phone]  CC:  Dr. Delton Coombes patient...patient c/o increased SOB  all the time and prod cough with yellow mucus...wheezing.  History of Present Illness: 13 former smoker, dx with COPD about 1999, also hx breast CA, CVA with R frontal AVM, colonic AVM's. Has been treated with Spiriva + Advair, since 2003.   ROV 07/27/09 -- Returns for regular follow up of COPD. Has continued to follow with Dr Gala Romney, She has hypertrophic cardiac dysfxn due to diastolic dysfxn not outflow tract obstruction. For the last week or so has had more cough, non-productive, no F/C, no CP but has had chest tightness. Going to pulm rehab at [place], feels that she may be a bit stronger but no large change. No exacerbations since last visit, no hospitalizations.   ROV 03/27/10 -- f/u for her COPD. She reports that she has had an acute worsening in SOB, evolving a cough. This weekend she "couldn't do anything", couldn't talk. Cough is non-productive. Has been using her SABA more frequently. She has been desaturated on 2L/'min pulsed to as low as 70's. Today was 88% on 2L/min.   ROV 04/17/10 -- returns for her COPD. Treated for an exacerbation last time with prednisone + azithro, was better while on the pred but quickly with UA irritation, dry cough, runny eyes, wheeze. For a period of time was off her O2, recently dropped to 70's when walking to car.   ROV 05/15/10 -- COPD. Last visit we treated for an exacerbation. She is much improved. Still with wheezing in the morning. Some am dyspnea. Cough better. Using SABA about once daily.   ROV 05/23/10 -- She feels like she caught a cold last week.  She has been more short of breath.  Also getting more cough with yellow sputum.  She has been getting chest tightness and more wheeze.  She has felt warm, but has not checked her temp.  She  denies hemoptysis.  She has not had abdominal symptoms or leg swelling.  Her sinuses and throat have been okay.  She has been using her ventolin more.  She has tried a Zpak before, but this does not seem to help.  Current Medications (verified): 1)  Levoxyl 112 Mcg Tabs (Levothyroxine Sodium) .Marland Kitchen.. 1 By Mouth Daily 2)  Levoxyl 50 Mcg Tabs (Levothyroxine Sodium) .Marland Kitchen.. 1 By Mouth Daily 3)  Lipitor 20 Mg Tabs (Atorvastatin Calcium) .... Take 1 Tablet By Mouth Once A Day 4)  Bisoprolol-Hydrochlorothiazide 5-6.25 Mg Tabs (Bisoprolol-Hydrochlorothiazide) .... Take 1 Tablet By Mouth Once A Day 5)  Fexofenadine Hcl 180 Mg Tabs (Fexofenadine Hcl) .... Take 1 Tablet By Mouth Once A Day 6)  Enablex 7.5 Mg Xr24h-Tab (Darifenacin Hydrobromide) .... Take 1 Tablet By Mouth Once A Day 7)  One-A-Day Extras Antioxidant  Caps (Multiple Vitamins-Minerals) .... Take 1 Tablet By Mouth Once A Day 8)  Calcium 600 1500 Mg Tabs (Calcium Carbonate) .... Take 1 Tablet By Mouth Once A Day 9)  Adult Aspirin Ec Low Strength 81 Mg Tbec (Aspirin) .... Take 1 Tablet By Mouth Once A Day 10)  Furosemide 20 Mg Tabs (Furosemide) .... Take 1 Tablet By Mouth Once A Day 11)  Advair Diskus 250-50 Mcg/dose Misc (Fluticasone-Salmeterol) .... Inhale 1 Puff Two Times A Day 12)  Spiriva Handihaler 18 Mcg Caps (Tiotropium Bromide Monohydrate) .... Inhale Contents of 1 Capsule Once A Day  Allergies (  verified): 1)  ! * Zpak 2)  ! Adhesive Tape  Past History:  Past Medical History: Reviewed history from 07/06/2009 and no changes required. 1. COPD on home O2 2. Carotid Stenosis with TIAs   --s/p L CEA   --Carotid u/s 1/10: L 60-79% R 40-69%   --ABIs and ab u/s: Normal 3. Abnormal ECG     --Myoview in [facility] [place] EF 74% normal perfusion     --Echo EF 60-65% no wall motion abnormalities. Mild TR with peak RVSP 39 4. HTN 5. Chonic dyspnea 6. Edema 7. Thyroid disease 8. h/o breast CA 9. allergic rhinitis 10. Cardiac MRI: EF  normal thickened septum with mild SAM  no scar or infiltrate  Past Surgical History: Reviewed history from 08/18/2008 and no changes required. Carotid Endarterectomy: L 2005 thyroidectomy-2007 Appendectomy-1950's Lumpectomy R 2002  Vital Signs:  Patient profile:   77 year old female Height:      66.5 inches (168.91 cm) Weight:      178 pounds (80.91 kg) BMI:     28.40 O2 Sat:      90 % on 3 L/min pulsed Temp:     97.4 degrees F (36.33 degrees C) oral Pulse rate:   90 / minute BP sitting:   110 / 62  (left arm) Cuff size:   regular  Vitals Entered By: Michel Bickers CMA (May 23, 2010 2:12 PM)  O2 Sat at Rest %:  90 O2 Flow:  3 L/min pulsed CC: Dr. Delton Coombes patient...patient c/o increased SOB  all the time and prod cough with yellow mucus...wheezing Comments Medications reviewed with the patient. Daytime phone verified. Michel Bickers CMA  May 23, 2010 2:21 PM   Physical Exam  General:  on supplemental oxygen, ill appearing, dyspneic, but can speak in full sentences Ears:  TMs intact and clear with normal canals Nose:  clear nasal discharge.   Mouth:  no exudate Neck:  no JVD.   Lungs:  prolonged exhalation, b/l exp wheezing, no dullness Heart:  regular rhythm and normal rate, 3/6 SM Abdomen:  soft, nontender, no masses, normal bowel sounds Pulses:  pulses normal Extremities:  no edema or cyanosis Neurologic:  normal CN II-XII and strength normal.   Cervical Nodes:  no significant adenopathy Psych:  anxious.     Impression & Recommendations:  Problem # 1:  CHRONIC OBSTRUCTIVE PULMONARY DISEASE, ACUTE EXACERBATION (ICD-491.21) She has recurrent exacerbation of her COPD.  I will give her a course of levaquin.  I have given her a depo-medrol shot today, and will give her a course of prednisone, but advised her to remain on prednisone until she can follow up with Dr. Delton Coombes.  Gave her a nebulizer treatment today with some improvement in her wheeze and dyspnea.  She is  to continue her other inhalers.  Will defer discussion about whether she would be a candidate for daliresp to Dr. Delton Coombes.  Medications Added to Medication List This Visit: 1)  Ventolin Hfa 108 (90 Base) Mcg/act Aers (Albuterol sulfate) .... 2 puffs every 4 hours as needed 2)  Prednisone 10 Mg Tabs (Prednisone) .... 3 pills for 2 days, 2 pills for 2 days, 1 pill for 2 days, 1/2 pill for 2 days, then 1/2 pill every other day 3)  Levaquin 500 Mg Tabs (Levofloxacin) .... One by mouth once daily for 7 days  Complete Medication List: 1)  Levoxyl 112 Mcg Tabs (Levothyroxine sodium) .Marland Kitchen.. 1 by mouth daily 2)  Levoxyl 50 Mcg Tabs (  Levothyroxine sodium) .Marland Kitchen.. 1 by mouth daily 3)  Lipitor 20 Mg Tabs (Atorvastatin calcium) .... Take 1 tablet by mouth once a day 4)  Bisoprolol-hydrochlorothiazide 5-6.25 Mg Tabs (Bisoprolol-hydrochlorothiazide) .... Take 1 tablet by mouth once a day 5)  Fexofenadine Hcl 180 Mg Tabs (Fexofenadine hcl) .... Take 1 tablet by mouth once a day 6)  Enablex 7.5 Mg Xr24h-tab (Darifenacin hydrobromide) .... Take 1 tablet by mouth once a day 7)  One-a-day Extras Antioxidant Caps (Multiple vitamins-minerals) .... Take 1 tablet by mouth once a day 8)  Calcium 600 1500 Mg Tabs (Calcium carbonate) .... Take 1 tablet by mouth once a day 9)  Adult Aspirin Ec Low Strength 81 Mg Tbec (Aspirin) .... Take 1 tablet by mouth once a day 10)  Furosemide 20 Mg Tabs (Furosemide) .... Take 1 tablet by mouth once a day 11)  Advair Diskus 250-50 Mcg/dose Misc (Fluticasone-salmeterol) .... Inhale 1 puff two times a day 12)  Spiriva Handihaler 18 Mcg Caps (Tiotropium bromide monohydrate) .... Inhale contents of 1 capsule once a day 13)  Ventolin Hfa 108 (90 Base) Mcg/act Aers (Albuterol sulfate) .... 2 puffs every 4 hours as needed 14)  Prednisone 10 Mg Tabs (Prednisone) .... 3 pills for 2 days, 2 pills for 2 days, 1 pill for 2 days, 1/2 pill for 2 days, then 1/2 pill every other day 15)  Levaquin 500 Mg  Tabs (Levofloxacin) .... One by mouth once daily for 7 days  Other Orders: Est. Patient Level IV (16109) Nebulizer Tx (60454) T-2 View CXR (71020TC) Depo- Medrol 80mg  (J1040)  Patient Instructions: 1)  Prednisone 10 mg pill: 3 pills daily for 2 days, 2 pills daily for 2 days, 1 pill daily for 2 days, 1/2 pill daily for 2 days, then 1/2 pill every other day until next visit with Dr. Delton Coombes 2)  Levaquin 500 mg once daily for 7 days 3)  Chest xray today 4)  Depo-medrol injection today 5)  Follow up with Dr. Delton Coombes in one to two weeks Prescriptions: LEVAQUIN 500 MG TABS (LEVOFLOXACIN) one by mouth once daily for 7 days  #7 x 0   Entered and Authorized by:   Coralyn Helling MD   Signed by:   Coralyn Helling MD on 05/23/2010   Method used:   Electronically to        [facility] (retail)       [address]       [place], [place]  [address]       Ph: [phone]       Fax: [phone]   RxID:   [id number] PREDNISONE 10 MG TABS (PREDNISONE) 3 pills for 2 days, 2 pills for 2 days, 1 pill for 2 days, 1/2 pill for 2 days, then 1/2 pill every other day  #30 x 1   Entered and Authorized by:   Coralyn Helling MD   Signed by:   Coralyn Helling MD on 05/23/2010   Method used:   Electronically to        [facility], [facility] (retail)       [address]       [place], [place]  [address]       Ph: [phone]       Fax: [phone]   RxID:   [id number]    Medication Administration  Injection # 1:    Medication: Depo- Medrol 80mg   Diagnosis: CHRONIC OBSTRUCTIVE PULMONARY DISEASE, ACUTE EXACERBATION (ICD-491.21)    Route: IM    Site: LUOQ gluteus    Exp Date: 05/24/2010    Lot #: [id number]    Mfr: [email or web address]    Patient tolerated injection without complications    Given by: Kandice Hams CMA (May 23, 2010 3:15 PM)  Medication # 3:    Medication: Xopenex 1.25mg     Diagnosis: CHRONIC OBSTRUCTIVE PULMONARY DISEASE, ACUTE EXACERBATION  (ICD-491.21)    Dose: 1vial    Route: inhaled    Exp Date: 05/24/2010    Lot #: [id number]    Mfr: [email or web address]    Patient tolerated medication without complications    Given by: Kandice Hams CMA (May 23, 2010 3:18 PM)  Orders Added: 1)  Est. Patient Level IV [16109] 2)  Nebulizer Tx [60454] 3)  T-2 View CXR [71020TC] 4)  Depo- Medrol 80mg  [J1040]

## 2010-10-16 NOTE — Assessment & Plan Note (Signed)
Summary: 2 wk f/u r/s per pt//jrc   Visit Type:  NP follow up visit Copy to:  [facility] Primary Provider/Referring Provider:  Osborne Oman, [place] Fax [phone]  CC:  Pt here for 2 week follow up. On Prednisone 10mg  1/2 every other day.  History of Present Illness: 11 former smoker, dx with COPD about 1999, also hx breast CA, CVA with R frontal AVM, colonic AVM's. Has been treated with Spiriva + Advair, since 2003.   ROV 07/27/09 -- Returns for regular follow up of COPD. Has continued to follow with Dr Gala Romney, She has hypertrophic cardiac dysfxn due to diastolic dysfxn not outflow tract obstruction. For the last week or so has had more cough, non-productive, no F/C, no CP but has had chest tightness. Going to pulm rehab at [place], feels that she may be a bit stronger but no large change. No exacerbations since last visit, no hospitalizations.   ROV 03/27/10 -- f/u for her COPD. She reports that she has had an acute worsening in SOB, evolving a cough. This weekend she "couldn't do anything", couldn't talk. Cough is non-productive. Has been using her SABA more frequently. She has been desaturated on 2L/'min pulsed to as low as 70's. Today was 88% on 2L/min.   ROV 04/17/10 -- returns for her COPD. Treated for an exacerbation last time with prednisone + azithro, was better while on the pred but quickly with UA irritation, dry cough, runny eyes, wheeze. For a period of time was off her O2, recently dropped to 70's when walking to car.   ROV 05/15/10 -- COPD. Last visit we treated for an exacerbation. She is much improved. Still with wheezing in the morning. Some am dyspnea. Cough better. Using SABA about once daily.   ROV 05/23/10 -- She feels like she caught a cold last week.  She has been more short of breath.  Also getting more cough with yellow sputum.  She has been getting chest tightness and more wheeze.  She has felt warm, but has not checked her temp.  She denies hemoptysis.  She has  not had abdominal symptoms or leg swelling.  Her sinuses and throat have been okay.  She has been using her ventolin more.  She has tried a Zpak before, but this does not seem to help. June 28, 2010 -Presents for a  2 week follow up.   On Prednisone 10mg  1/2 every other day. Seen 06/02/10 for acute COPD exacerbation , tx w/ Levaquin and steroids. She  is only minimally improved with cough and wheeizng. Mucus  thick at times , unable to get up .  Xray last visit was w/ no acute changes.  Has some reflux and heartburn. Denies chest pain, orthopnea, hemoptysis, fever, n/v/d, edema, headache.   Preventive Screening-Counseling & Management  Alcohol-Tobacco     Smoking Status: quit  Current Medications (verified): 1)  Levoxyl 112 Mcg Tabs (Levothyroxine Sodium) .Marland Kitchen.. 1 By Mouth Daily 2)  Levoxyl 50 Mcg Tabs (Levothyroxine Sodium) .Marland Kitchen.. 1 By Mouth Daily 3)  Lipitor 20 Mg Tabs (Atorvastatin Calcium) .... Take 1 Tablet By Mouth Once A Day 4)  Bisoprolol-Hydrochlorothiazide 5-6.25 Mg Tabs (Bisoprolol-Hydrochlorothiazide) .... Take 1 Tablet By Mouth Once A Day 5)  Fexofenadine Hcl 180 Mg Tabs (Fexofenadine Hcl) .... Take 1 Tablet By Mouth Once A Day 6)  Enablex 7.5 Mg Xr24h-Tab (Darifenacin Hydrobromide) .... Take 1 Tablet By Mouth Once A Day 7)  One-A-Day Extras Antioxidant  Caps (Multiple Vitamins-Minerals) .... Take 1 Tablet By  Mouth Once A Day 8)  Calcium 600 1500 Mg Tabs (Calcium Carbonate) .... Take 1 Tablet By Mouth Once A Day 9)  Adult Aspirin Ec Low Strength 81 Mg Tbec (Aspirin) .... Take 1 Tablet By Mouth Once A Day 10)  Furosemide 20 Mg Tabs (Furosemide) .... Take 1 Tablet By Mouth Once A Day 11)  Advair Diskus 250-50 Mcg/dose Misc (Fluticasone-Salmeterol) .... Inhale 1 Puff Two Times A Day 12)  Spiriva Handihaler 18 Mcg Caps (Tiotropium Bromide Monohydrate) .... Inhale Contents of 1 Capsule Once A Day 13)  Ventolin Hfa 108 (90 Base) Mcg/act Aers (Albuterol Sulfate) .... 2 Puffs Every 4 Hours As  Needed 14)  Prednisone 10 Mg Tabs (Prednisone) .... 3 Pills For 2 Days, 2 Pills For 2 Days, 1 Pill For 2 Days, 1/2 Pill For 2 Days, Then 1/2 Pill Every Other Day  Allergies (verified): 1)  ! * Zpak 2)  ! Adhesive Tape  Past History:  Past Medical History: Last updated: 07/06/2009 1. COPD on home O2 2. Carotid Stenosis with TIAs   --s/p L CEA   --Carotid u/s 1/10: L 60-79% R 40-69%   --ABIs and ab u/s: Normal 3. Abnormal ECG     --Myoview in [facility] [place] EF 74% normal perfusion     --Echo EF 60-65% no wall motion abnormalities. Mild TR with peak RVSP 39 4. HTN 5. Chonic dyspnea 6. Edema 7. Thyroid disease 8. h/o breast CA 9. allergic rhinitis 10. Cardiac MRI: EF normal thickened septum with mild SAM  no scar or infiltrate  Past Surgical History: Last updated: Sep 07, 2008 Carotid Endarterectomy: L 2005 thyroidectomy-2007 Appendectomy-1950's Lumpectomy R 2002  Family History: Last updated: 09-07-08 father-deceased emphysema sister-COPD, pulm HTN sister-emphysema, asthma brothers-x2 emphysema mother-CVA brother-colon CA  Social History: Last updated: 02/07/2009 Patient states former smoker. Quit smoking 1999.  Smoked x 30 yrs 1ppd. Pt is single, no children.  Pt lives with 2 sisters. Pt is retired, formerly Estate agent for an apartment complex.  Alcohol Use - no Regular Exercise - no Drug Use - no  Risk Factors: Smoking Status: quit (06/28/2010)  Review of Systems      See HPI  Vital Signs:  Patient profile:   77 year old female Height:      66.5 inches Weight:      174 pounds BMI:     27.76 O2 Sat:      90 % on 3 L/min pulsed Temp:     97.0 degrees F oral Pulse rate:   86 / minute BP sitting:   102 / 64  (left arm) Cuff size:   regular  Vitals Entered By: Zackery Barefoot CMA (June 28, 2010 10:33 AM)  O2 Flow:  3 L/min pulsed CC: Pt here for 2 week follow up. On Prednisone 10mg  1/2 every other day Comments Medications reviewed with  patient Verified contact number and pharmacy with patient Zackery Barefoot CMA  June 28, 2010 10:34 AM    Physical Exam  Additional Exam:  GEN: A/Ox3; pleasant , NAD HEENT:  [place]/AT, , EACs-clear, TMs-wnl, NOSE-clear, THROAT-clear NECK:  Supple w/ fair ROM; no JVD; normal carotid impulses w/o bruits; no thyromegaly or nodules palpated; no lymphadenopathy. RESP  Coarse BS w/ faint exp wheezing.  CARD:  RRR, no m/r/g   GI:   Soft & nt; nml bowel sounds; no organomegaly or masses detected. Musco: Warm bil,  no calf tenderness edema, clubbing, pulses intact Neuro: no focal deficits noted    Impression & Recommendations:  Problem # 1:  CHRONIC OBSTRUCTIVE PULMONARY DISEASE, ACUTE EXACERBATION (ICD-491.21) Exacerbation: recurrent flare  labs pending.  Begin Dexilant 60mg  once daily  Add Pepcid 20mg  at bedtime  Increase Prednisone 10mg  4 tabs for 3  days, then 3 tabs for 3 days, 2 tabs for 3 days, then 1 tab daily  I will call labs results.  follow up Dr. Delton Coombes in 2 weeks.  Please contact office for sooner follow up if symptoms do not improve or worsen  Orders: TLB-BMP (Basic Metabolic Panel-BMET) (80048-METABOL) TLB-CBC Platelet - w/Differential (85025-CBCD) TLB-BNP (B-Natriuretic Peptide) (83880-BNPR) TLB-Sedimentation Rate (ESR) (85652-ESR) Est. Patient Level IV (24401)  Medications Added to Medication List This Visit: 1)  Prednisone 10 Mg Tabs (Prednisone) .... 4 tabs for 3  days, then 3 tabs for 3 days, 2 tabs for 3 days, then 1 tab daily 2)  Albuterol Sulfate (2.5 Mg/74ml) 0.083% Nebu (Albuterol sulfate) .Marland Kitchen.. 1 vial via hhn every 4-6 hrs as needed wheezing  Complete Medication List: 1)  Levoxyl 112 Mcg Tabs (Levothyroxine sodium) .Marland Kitchen.. 1 by mouth daily 2)  Levoxyl 50 Mcg Tabs (Levothyroxine sodium) .Marland Kitchen.. 1 by mouth daily 3)  Lipitor 20 Mg Tabs (Atorvastatin calcium) .... Take 1 tablet by mouth once a day 4)  Bisoprolol-hydrochlorothiazide 5-6.25 Mg Tabs  (Bisoprolol-hydrochlorothiazide) .... Take 1 tablet by mouth once a day 5)  Fexofenadine Hcl 180 Mg Tabs (Fexofenadine hcl) .... Take 1 tablet by mouth once a day 6)  Enablex 7.5 Mg Xr24h-tab (Darifenacin hydrobromide) .... Take 1 tablet by mouth once a day 7)  One-a-day Extras Antioxidant Caps (Multiple vitamins-minerals) .... Take 1 tablet by mouth once a day 8)  Calcium 600 1500 Mg Tabs (Calcium carbonate) .... Take 1 tablet by mouth once a day 9)  Adult Aspirin Ec Low Strength 81 Mg Tbec (Aspirin) .... Take 1 tablet by mouth once a day 10)  Furosemide 20 Mg Tabs (Furosemide) .... Take 1 tablet by mouth once a day 11)  Advair Diskus 250-50 Mcg/dose Misc (Fluticasone-salmeterol) .... Inhale 1 puff two times a day 12)  Spiriva Handihaler 18 Mcg Caps (Tiotropium bromide monohydrate) .... Inhale contents of 1 capsule once a day 13)  Ventolin Hfa 108 (90 Base) Mcg/act Aers (Albuterol sulfate) .... 2 puffs every 4 hours as needed 14)  Prednisone 10 Mg Tabs (Prednisone) .... 4 tabs for 3  days, then 3 tabs for 3 days, 2 tabs for 3 days, then 1 tab daily 15)  Albuterol Sulfate (2.5 Mg/24ml) 0.083% Nebu (Albuterol sulfate) .Marland Kitchen.. 1 vial via hhn every 4-6 hrs as needed wheezing  Patient Instructions: 1)  Begin Dexilant 60mg  once daily  2)  Add Pepcid 20mg  at bedtime  3)  Increase Prednisone 10mg  4 tabs for 3  days, then 3 tabs for 3 days, 2 tabs for 3 days, then 1 tab daily  4)  I will call labs results.  5)  follow up Dr. Delton Coombes in 2 weeks.  6)  Please contact office for sooner follow up if symptoms do not improve or worsen  Prescriptions: ALBUTEROL SULFATE (2.5 MG/3ML) 0.083% NEBU (ALBUTEROL SULFATE) 1 vial via HHN every 4-6 hrs as needed wheezing  #90 x 3   Entered and Authorized by:   Rubye Oaks NP   Signed by:   Rubye Oaks NP on 06/28/2010   Method used:   Electronically to        [facility] (retail)       [phone] [address]  [place], [place]  [address]        Ph: [phone]       Fax: [phone]   RxID:   [id number] PREDNISONE 10 MG TABS (PREDNISONE) 4 tabs for 3  days, then 3 tabs for 3 days, 2 tabs for 3 days, then 1 tab daily  #60 x 1   Entered and Authorized by:   Rubye Oaks NP   Signed by:   Rubye Oaks NP on 06/28/2010   Method used:   Electronically to        [facility] (retail)       [address]       [place], [place]  [address]       Ph: [phone]       Fax: [phone]   RxID:   [id number]

## 2010-10-16 NOTE — Progress Notes (Signed)
  Labs faxed to Melissa/Dr.Elliott office @ [phone] [facility]  July 04, 2010 3:55 PM

## 2010-10-16 NOTE — Miscellaneous (Signed)
Summary: Orders Update  Clinical Lists Changes  Orders: Added new Test order of Carotid Duplex (Carotid Duplex) - Signed 

## 2010-10-16 NOTE — Miscellaneous (Signed)
Summary: [facility]   Imported By: Harlon Flor 11/03/2009 16:47:36  _____________________________________________________________________  External Attachment:    Type:   Image     Comment:   External Document

## 2010-10-18 NOTE — Assessment & Plan Note (Signed)
Summary: Acute NP office visit - COPD    Copy to:  [facility] Primary Provider/Referring Provider:  Osborne Oman, [place] Fax [phone]  CC:  wheezing, increased SOB, dry cough x2-3days - denies mucus production, and f/c/s.  History of Present Illness: 65 former smoker, dx with COPD about 1999, also hx breast CA, CVA with R frontal AVM, colonic AVM's. Also diastolic dysfxn followed Dr Gala Romney. Has been treated with Spiriva + Advair, since 2003.  ROV 04/17/10 -- returns for her COPD. Treated for an exacerbation last time with prednisone + azithro, was better while on the pred but quickly with UA irritation, dry cough, runny eyes, wheeze. For a period of time was off her O2, recently dropped to 70's when walking to car.   ROV 05/15/10 -- COPD. Last visit we treated for an exacerbation. She is much improved. Still with wheezing in the morning. Some am dyspnea. Cough better. Using SABA about once daily.   ROV 05/23/10 -- She feels like she caught a cold last week.  She has been more short of breath.  Also getting more cough with yellow sputum.  She has been getting chest tightness and more wheeze.  She has felt warm, but has not checked her temp.  She denies hemoptysis.  She has not had abdominal symptoms or leg swelling.  Her sinuses and throat have been okay.  She has been using her ventolin more.  She has tried a Zpak before, but this does not seem to help.  June 28, 2010 -Presents for a  2 week follow up.   On Prednisone 10mg  1/2 every other day. Seen 06/02/10 for acute COPD exacerbation , tx w/ Levaquin and steroids. She  is only minimally improved with cough and wheeizng. Mucus  thick at times , unable to get up .  Xray last visit was w/ no acute changes.  Has some reflux and heartburn. Denies chest pain, orthopnea, hemoptysis, fever, n/v/d, edema, headache.   ROV 08/14/10 -- COPD, treated w pred taper 06/28/10 for persistent obstructive symptoms. Has been treated w abx/pred. Now better,  in retrospect ? allergies, ? inhaled exposure. Now off Pred. Advair + Spiriva. No flu shot yet. hasn't needed SABA in several days.  September 21, 2010--Presents for an acute office visit. Complains of wheezing, increased SOB, dry cough x2-3days. Complains of last 4-5 months has been on abx and steroids each month. As soon as she finishes her course symptoms begin to come back with cough, wheezing and dyspnea. Last 2 days, dry cough w/ wheezing. No discolored mucus. Denies chest pain,  orthopnea, hemoptysis, fever, n/v/d, edema, headache.   Medications Prior to Update: 1)  Spiriva Handihaler 18 Mcg Caps (Tiotropium Bromide Monohydrate) .... Inhale Contents of 1 Capsule Once A Day 2)  Advair Diskus 250-50 Mcg/dose Misc (Fluticasone-Salmeterol) .... Inhale 1 Puff Two Times A Day 3)  Levoxyl 112 Mcg Tabs (Levothyroxine Sodium) .Marland Kitchen.. 1 By Mouth Daily 4)  Levoxyl 50 Mcg Tabs (Levothyroxine Sodium) .Marland Kitchen.. 1 By Mouth Daily 5)  Lipitor 20 Mg Tabs (Atorvastatin Calcium) .... Take 1 Tablet By Mouth Once A Day 6)  Bisoprolol-Hydrochlorothiazide 5-6.25 Mg Tabs (Bisoprolol-Hydrochlorothiazide) .... Take 1 Tablet By Mouth Once A Day 7)  Fexofenadine Hcl 180 Mg Tabs (Fexofenadine Hcl) .... Take 1 Tablet By Mouth Once A Day 8)  Enablex 7.5 Mg Xr24h-Tab (Darifenacin Hydrobromide) .... Take 1 Tablet By Mouth Once A Day 9)  One-A-Day Extras Antioxidant  Caps (Multiple Vitamins-Minerals) .... Take 1 Tablet By Mouth Once  A Day 10)  Calcium 600 1500 Mg Tabs (Calcium Carbonate) .... Take 1 Tablet By Mouth Once A Day 11)  Adult Aspirin Ec Low Strength 81 Mg Tbec (Aspirin) .... Take 1 Tablet By Mouth Once A Day 12)  Furosemide 20 Mg Tabs (Furosemide) .... Take 1 Tablet By Mouth Once A Day 13)  Ventolin Hfa 108 (90 Base) Mcg/act Aers (Albuterol Sulfate) .... 2 Puffs Every 4 Hours As Needed 14)  Prednisone 10 Mg Tabs (Prednisone) .... 4 Tabs For 3  Days, Then 3 Tabs For 3 Days, 2 Tabs For 3 Days, Then 1 Tab Daily 15)  Dexilant 60  Mg Cpdr (Dexlansoprazole) .... Take 1 Tablet By Mouth Once A Day 16)  Albuterol Sulfate (2.5 Mg/33ml) 0.083% Nebu (Albuterol Sulfate) .Marland Kitchen.. 1 Vial Via Hhn Every 4-6 Hrs As Needed Wheezing  Current Medications (verified): 1)  Levoxyl 112 Mcg Tabs (Levothyroxine Sodium) .Marland Kitchen.. 1 By Mouth Daily 2)  Levoxyl 50 Mcg Tabs (Levothyroxine Sodium) .Marland Kitchen.. 1 By Mouth Daily 3)  Lipitor 20 Mg Tabs (Atorvastatin Calcium) .... Take 1 Tablet By Mouth Once A Day 4)  Bisoprolol-Hydrochlorothiazide 5-6.25 Mg Tabs (Bisoprolol-Hydrochlorothiazide) .... Take 1 Tablet By Mouth Once A Day 5)  Fexofenadine Hcl 180 Mg Tabs (Fexofenadine Hcl) .... Take 1 Tablet By Mouth Once A Day 6)  Enablex 7.5 Mg Xr24h-Tab (Darifenacin Hydrobromide) .... Take 1 Tablet By Mouth Once A Day 7)  One-A-Day Extras Antioxidant  Caps (Multiple Vitamins-Minerals) .... Take 1 Tablet By Mouth Once A Day 8)  Calcium 600 1500 Mg Tabs (Calcium Carbonate) .... Take 1 Tablet By Mouth Once A Day 9)  Adult Aspirin Ec Low Strength 81 Mg Tbec (Aspirin) .... Take 1 Tablet By Mouth Once A Day 10)  Furosemide 20 Mg Tabs (Furosemide) .... Take 1 Tablet By Mouth Once A Day 11)  Advair Diskus 250-50 Mcg/dose Misc (Fluticasone-Salmeterol) .... Inhale 1 Puff Two Times A Day 12)  Spiriva Handihaler 18 Mcg Caps (Tiotropium Bromide Monohydrate) .... Inhale Contents of 1 Capsule Once A Day 13)  Ventolin Hfa 108 (90 Base) Mcg/act Aers (Albuterol Sulfate) .... 2 Puffs Every 4 Hours As Needed 14)  Albuterol Sulfate (2.5 Mg/41ml) 0.083% Nebu (Albuterol Sulfate) .Marland Kitchen.. 1 Vial Via Hhn Every 4-6 Hrs As Needed Wheezing  Allergies (verified): 1)  ! * Zpak 2)  ! Adhesive Tape  Past History:  Past Medical History: Last updated: 07/06/2009 1. COPD on home O2 2. Carotid Stenosis with TIAs   --s/p L CEA   --Carotid u/s 1/10: L 60-79% R 40-69%   --ABIs and ab u/s: Normal 3. Abnormal ECG     --Myoview in [facility], [place] EF 74% normal perfusion     --Echo EF 60-65% no wall  motion abnormalities. Mild TR with peak RVSP 39 4. HTN 5. Chonic dyspnea 6. Edema 7. Thyroid disease 8. h/o breast CA 9. allergic rhinitis 10. Cardiac MRI: EF normal thickened septum with mild SAM  no scar or infiltrate  Past Surgical History: Last updated: Aug 24, 2008 Carotid Endarterectomy: L 2005 thyroidectomy-2007 Appendectomy-1950's Lumpectomy R 2002  Family History: Last updated: 2008-08-24 father-deceased emphysema sister-COPD, pulm HTN sister-emphysema, asthma brothers-x2 emphysema mother-CVA brother-colon CA  Social History: Last updated: 02/07/2009 Patient states former smoker. Quit smoking 1999.  Smoked x 30 yrs 1ppd. Pt is single, no children.  Pt lives with 2 sisters. Pt is retired, formerly Estate agent for an apartment complex.  Alcohol Use - no Regular Exercise - no Drug Use - no  Risk Factors: Smoking Status: quit (  06/28/2010)  Review of Systems      See HPI  Vital Signs:  Patient profile:   77 year old female Height:      66.5 inches Weight:      175.38 pounds BMI:     27.98 O2 Sat:      91 % on 3 L/min pusling Temp:     97.0 degrees F oral Pulse rate:   89 / minute BP sitting:   150 / 88  (left arm) Cuff size:   regular  Vitals Entered By: Boone Master CNA/MA (September 21, 2010 11:37 AM)  O2 Flow:  3 L/min pusling CC: wheezing, increased SOB, dry cough x2-3days - denies mucus production, f/c/s Is Patient Diabetic? No Comments Medications reviewed with patient Daytime contact number verified with patient. Boone Master CNA/MA  September 21, 2010 11:40 AM    Physical Exam  Additional Exam:  GEN: A/Ox3; pleasant , NAD HEENT:  [place]/AT, , EACs-clear, TMs-wnl, NOSE-clear drainage, THROAT-clear, no exuadate NECK:  Supple w/ fair ROM; no JVD; normal carotid impulses w/o bruits; no thyromegaly or nodules palpated; no lymphadenopathy. RESP  Coarse BS w/ faint exp wheezing. and loud upper airway psuedowheezing  CARD:  RRR, no m/r/g   GI:   Soft  & nt; nml bowel sounds; no organomegaly or masses detected. Musco: Warm bil,  no calf tenderness edema, clubbing, pulses intact Neuro: no focal deficits noted    Impression & Recommendations:  Problem # 1:  CHRONIC OBSTRUCTIVE PULMONARY DISEASE, ACUTE EXACERBATION (ICD-491.21) Recurrent exacerbation w/ upper airway inflammation component  Suspect her dyspnea is multifactoral with her multiple comorbities.  Will change ICS/LABA from Advair to symbicort in hopes for less dry powder effect  on post. pharynx.  Tx rhinitis and Gerd effects.  Plan:  Stop Advair  Begin Symbicort 160/4.1mcg 2 puffs two times a day -brush /rinse and gargle after use, drink cup of water.  Take allegra once daily  Saline nasal spray/gel 2 puffs two times a day  GERD diet.  Prilosec 20mg  once daily before meal.  Prednisone taper over next week.  Albuterol neb every 4hr as needed wheezing  Mucinex DM two times a day as needed cough/congestion  Please contact office for sooner follow up if symptoms do not improve or worsen  follow up 2 weeks Dr. Delton Coombes  Orders: Nebulizer Tx ([address]) Est. Patient Level IV ([address])  Medications Added to Medication List This Visit: 1)  Symbicort 160-4.5 Mcg/act Aero (Budesonide-formoterol fumarate) .... 2 puffs two times a day 2)  Prilosec 20 Mg Cpdr (Omeprazole) .Marland Kitchen.. 1 by mouth once daily 3)  Proair Hfa 108 (90 Base) Mcg/act Aers (Albuterol sulfate) .... 2 puffs every 4hr as needed wheezing 4)  Prednisone 10 Mg Tabs (Prednisone) .... 4 tabs for 3 days, then 3 tabs for 3 days, 2 tabs for 3 days, then 1 tab for 3 days, then stop  Patient Instructions: 1)  Stop Advair  2)  Begin Symbicort 160/4.98mcg 2 puffs two times a day -brush /rinse and gargle after use, drink cup of water.  3)  Take allegra once daily  4)  Saline nasal spray/gel 2 puffs two times a day  5)  GERD diet.  6)  Prilosec 20mg  once daily before meal.  7)  Prednisone taper over next week.  8)  Albuterol neb every  4hr as needed wheezing  9)  Mucinex DM two times a day as needed cough/congestion  10)  Please contact office for sooner follow up  if symptoms do not improve or worsen  11)  follow up 2 weeks Dr. Delton Coombes  Prescriptions: PREDNISONE 10 MG TABS (PREDNISONE) 4 tabs for 3 days, then 3 tabs for 3 days, 2 tabs for 3 days, then 1 tab for 3 days, then stop  #30 x 0   Entered and Authorized by:   Rubye Oaks NP   Signed by:   Rubye Oaks NP on 09/21/2010   Method used:   Electronically to        [facility] (retail)       [address]       [place], [place]  [address]       Ph: [phone]       Fax: [phone]   RxID:   [id number] PROAIR HFA 108 (90 BASE) MCG/ACT AERS (ALBUTEROL SULFATE) 2 puffs every 4hr as needed wheezing  #1 x 5   Entered and Authorized by:   Rubye Oaks NP   Signed by:   Rubye Oaks NP on 09/21/2010   Method used:   Electronically to        [facility], [facility] (retail)       [address]       [place], [place]  [address]       Ph: [phone]       Fax: [phone]   RxID:   [id number] ALBUTEROL SULFATE (2.5 MG/3ML) 0.083% NEBU (ALBUTEROL SULFATE) 1 vial via HHN every 4-6 hrs as needed wheezing  #90 x 3   Entered and Authorized by:   Rubye Oaks NP   Signed by:   Rubye Oaks NP on 09/21/2010   Method used:   Electronically to        [facility], [facility] (retail)       [address]       [place], [place]  [address]       Ph: [phone]       Fax: [phone]   RxID:   [id number] FEXOFENADINE HCL 180 MG TABS (FEXOFENADINE HCL) Take 1 tablet by mouth once a day  #30 x 1   Entered and Authorized by:   Rubye Oaks NP   Signed by:   Rubye Oaks NP on 09/21/2010   Method used:   Electronically to        [facility], [facility] (retail)       [address]       [place], [place]  [address]       Ph: [phone]       Fax: [phone]    RxID:   [id number]

## 2010-10-18 NOTE — Progress Notes (Signed)
Summary: nos appt  Phone Note Call from Patient   Caller: [email or web address]  Call For: Akeema Broder Summary of Call: Rsc nos from 1/24 to 2/14. Initial call taken by: Darletta Moll,  October 10, 2010 10:20 AM

## 2010-10-18 NOTE — Progress Notes (Signed)
Summary: Refill Prednisone  Phone Note Call from Patient Call back at [phone] or [phone]   Caller: Niece Melissa Summary of Call: Patient has appointment with Dr. Delton Coombes on 2/14.  Will run out of Prednisone before that visit and will need refill. Initial call taken by: Leonette Monarch,  October 08, 2010 9:40 AM  Follow-up for Phone Call        Pt was scheduled to see RB tomorrow but had to reschedule to 2/14 due to transportation issues. Pt was prescribed a 1 week pred taper by TP first of the year and is now out of same. Melisa wants to know if RB plans on keeping pt on low dose steroid treatment and if so needs prescription for same. Please advise. Thanks. Zackery Barefoot CMA  October 08, 2010 12:49 PM  [facility]  Additional Follow-up for Phone Call Additional follow up Details #1::        We should continue 10mg  qd at least until we see each other. Will refill now Leslye Peer MD  October 08, 2010 5:18 PM  Additional Follow-up by: Leslye Peer MD,  October 08, 2010 5:18 PM    Additional Follow-up for Phone Call Additional follow up Details #2::    [email or web address] Randell Loop CMA  October 08, 2010 5:25 PM   [name], Efraim Kaufmann notified that rx was sent to pharmacy and that pt should stay on Pred 10mg /day. Abigail Miyamoto RN  October 09, 2010 9:01 AM   New/Updated Medications: PREDNISONE 10 MG TABS (PREDNISONE) 1 by mouth once daily Prescriptions: PREDNISONE 10 MG TABS (PREDNISONE) 1 by mouth once daily  #30 x 2   Entered and Authorized by:   Leslye Peer MD   Signed by:   Leslye Peer MD on 10/08/2010   Method used:   Electronically to        [facility] (retail)       [address]       [place], [place]  [address]       Ph: [phone]       Fax: [phone]   RxID:   [id number]

## 2010-10-30 ENCOUNTER — Ambulatory Visit (INDEPENDENT_AMBULATORY_CARE_PROVIDER_SITE_OTHER): Payer: Medicare Other | Admitting: Emergency Medicine

## 2010-10-30 ENCOUNTER — Encounter: Payer: Self-pay | Admitting: Emergency Medicine

## 2010-10-30 DIAGNOSIS — J449 Chronic obstructive pulmonary disease, unspecified: Secondary | ICD-10-CM

## 2010-11-07 NOTE — Assessment & Plan Note (Signed)
Summary: COPD   Visit Type:  Follow-up Copy to:  [facility] Primary Provider/Referring Provider:  Osborne Oman, [place] Fax [phone]  CC:  COPD.  Patient says her breathing has improved...no cough..  History of Present Illness: 77 former smoker, dx with COPD about 1999, also hx breast CA, CVA with R frontal AVM, colonic AVM's. Also diastolic dysfxn followed Dr Gala Romney. Has been treated with Spiriva + Advair, since 2003.  ROV 05/15/10 -- COPD. Last visit we treated for an exacerbation. She is much improved. Still with wheezing in the morning. Some am dyspnea. Cough better. Using SABA about once daily.   ROV 05/23/10 -- She feels like she caught a cold last week.  She has been more short of breath.  Also getting more cough with yellow sputum.  She has been getting chest tightness and more wheeze.  She has felt warm, but has not checked her temp.  She denies hemoptysis.  She has not had abdominal symptoms or leg swelling.  Her sinuses and throat have been okay.  She has been using her ventolin more.  She has tried a Zpak before, but this does not seem to help.  June 28, 2010 -Presents for a  2 week follow up.   On Prednisone 10mg  1/2 every other day. Seen 06/02/10 for acute COPD exacerbation , tx w/ Levaquin and steroids. She  is only minimally improved with cough and wheeizng. Mucus  thick at times , unable to get up .  Xray last visit was w/ no acute changes.  Has some reflux and heartburn. Denies chest pain, orthopnea, hemoptysis, fever, n/v/d, edema, headache.   ROV 08/14/10 -- COPD, treated w pred taper 06/28/10 for persistent obstructive symptoms. Has been treated w abx/pred. Now better, in retrospect ? allergies, ? inhaled exposure. Now off Pred. Advair + Spiriva. No flu shot yet. hasn't needed SABA in several days.   September 21, 2010--Presents for an acute office visit. Complains of wheezing, increased SOB, dry cough x2-3days. Complains of last 4-5 months has been on abx and  steroids each month. As soon as she finishes her course symptoms begin to come back with cough, wheezing and dyspnea. Last 2 days, dry cough w/ wheezing. No discolored mucus. Denies chest pain,  orthopnea, hemoptysis, fever, n/v/d, edema, headache.   10/30/10 -- follow up severe COPD. Flare last visit, treated with pred and abx, changed Advair to Symbicort to see if this helped with UA irritation and cough. We left her on Pred 10mg  by mouth once daily with plans to f/u and decide whether to stay on chronically.   Preventive Screening-Counseling & Management  Alcohol-Tobacco     Smoking Status: quit  Current Medications (verified): 1)  Spiriva Handihaler 18 Mcg Caps (Tiotropium Bromide Monohydrate) .... Inhale Contents of 1 Capsule Once A Day 2)  Symbicort 160-4.5 Mcg/act Aero (Budesonide-Formoterol Fumarate) .... 2 Puffs Two Times A Day 3)  Levoxyl 112 Mcg Tabs (Levothyroxine Sodium) .Marland Kitchen.. 1 By Mouth Daily 4)  Levoxyl 50 Mcg Tabs (Levothyroxine Sodium) .Marland Kitchen.. 1 By Mouth Daily 5)  Lipitor 20 Mg Tabs (Atorvastatin Calcium) .... Take 1 Tablet By Mouth Once A Day 6)  Bisoprolol-Hydrochlorothiazide 5-6.25 Mg Tabs (Bisoprolol-Hydrochlorothiazide) .... Take 1 Tablet By Mouth Once A Day 7)  Prilosec 20 Mg Cpdr (Omeprazole) .Marland Kitchen.. 1 By Mouth Once Daily 8)  Fexofenadine Hcl 180 Mg Tabs (Fexofenadine Hcl) .... Take 1 Tablet By Mouth Once A Day 9)  Enablex 7.5 Mg Xr24h-Tab (Darifenacin Hydrobromide) .... Take 1 Tablet By  Mouth Once A Day 10)  One-A-Day Extras Antioxidant  Caps (Multiple Vitamins-Minerals) .... Take 1 Tablet By Mouth Once A Day 11)  Calcium 600 1500 Mg Tabs (Calcium Carbonate) .... Take 1 Tablet By Mouth Once A Day 12)  Adult Aspirin Ec Low Strength 81 Mg Tbec (Aspirin) .... Take 1 Tablet By Mouth Once A Day 13)  Furosemide 20 Mg Tabs (Furosemide) .... Take 1 Tablet By Mouth Once A Day 14)  Proair Hfa 108 (90 Base) Mcg/act Aers (Albuterol Sulfate) .... 2 Puffs Every 4hr As Needed Wheezing 15)   Albuterol Sulfate (2.5 Mg/49ml) 0.083% Nebu (Albuterol Sulfate) .Marland Kitchen.. 1 Vial Via Hhn Every 4-6 Hrs As Needed Wheezing 16)  Prednisone 10 Mg Tabs (Prednisone) .Marland Kitchen.. 1 By Mouth Once Daily  Allergies (verified): 1)  ! * Zpak 2)  ! Adhesive Tape  Vital Signs:  Patient profile:   77 year old female Height:      66.5 inches (168.91 cm) Weight:      173 pounds (78.64 kg) BMI:     27.60 O2 Sat:      95 % on Room air Temp:     97.7 degrees F (36.50 degrees C) oral Pulse rate:   70 / minute BP sitting:   114 / 72  (left arm) Cuff size:   regular  Vitals Entered By: Michel Bickers CMA (October 30, 2010 12:08 PM)  O2 Sat at Rest %:  95 O2 Flow:  Room air CC: COPD.  Patient says her breathing has improved...no cough. Comments Medications reviewed with patient Michel Bickers CMA  October 30, 2010 12:09 PM   Physical Exam  General:  on supplemental oxygen, ill appearing, dyspneic, but can speak in full sentences Head:  normocephalic and atraumatic Eyes:  not injected  Ears:  TMs intact and clear with normal canals Nose:  clear Mouth:  no exudate Neck:  no JVD.   Lungs:  prolonged exhalation, b/l exp wheezing, no dullness Heart:  regular rhythm and normal rate, 3/6 SM Abdomen:  soft, nontender, no masses, normal bowel sounds Msk:  no deformity or scoliosis noted with normal posture Pulses:  pulses normal Extremities:  no edema or cyanosis Neurologic:  non-focal Skin:  intact without lesions or rashes Cervical Nodes:  no significant adenopathy Psych:  slightly anxious, otherwise normal   Impression & Recommendations:  Problem # 1:  COPD (ICD-496) - wants to go back to Advair from Symbicort - decrease Pred to 5, will f/u to decide whether we taper further, continue chronically - rov 2 mo  Problem # 2:  PULMONARY HYPERTENSION, SECONDARY (ICD-416.8)  Medications Added to Medication List This Visit: 1)  Prednisone 5 Mg Tabs (Prednisone) .Marland Kitchen.. 1 by mouth once daily  Other Orders: Est.  Patient Level IV ([address])  Patient Instructions: 1)  Decrease your Prednisone to 5mg  by mouth once daily  2)  Change Symbicort back to Advair 250/50, 1 inhalation two times a day 3)  Continue Spiriva once daily  4)  Follow up with Dr Delton Coombes in 2 months or as needed  Prescriptions: PREDNISONE 5 MG TABS (PREDNISONE) 1 by mouth once daily  #30 x 4   Entered and Authorized by:   Leslye Peer MD   Signed by:   Leslye Peer MD on 10/30/2010   Method used:   Electronically to        [facility] (retail)       [address]       [place]  [place], [place]  [address]       Ph: [phone]       Fax: [phone]   RxID:   [id number]

## 2010-12-02 LAB — POCT I-STAT 3, ART BLOOD GAS (G3+)
Acid-Base Excess: 8 mmol/L — ABNORMAL HIGH (ref 0.0–2.0)
Bicarbonate: 33.9 mEq/L — ABNORMAL HIGH (ref 20.0–24.0)
pH, Arterial: 7.429 — ABNORMAL HIGH (ref 7.350–7.400)

## 2010-12-02 LAB — POCT I-STAT 3, VENOUS BLOOD GAS (G3P V)
Acid-Base Excess: 7 mmol/L — ABNORMAL HIGH (ref 0.0–2.0)
Bicarbonate: 32 mEq/L — ABNORMAL HIGH (ref 20.0–24.0)
Bicarbonate: 34 mEq/L — ABNORMAL HIGH (ref 20.0–24.0)
TCO2: 35 mmol/L (ref 0–100)
pCO2, Ven: 55.7 mmHg — ABNORMAL HIGH (ref 45.0–50.0)
pCO2, Ven: 59.6 mmHg — ABNORMAL HIGH (ref 45.0–50.0)
pH, Ven: 7.364 — ABNORMAL HIGH (ref 7.250–7.300)
pH, Ven: 7.386 — ABNORMAL HIGH (ref 7.250–7.300)
pH, Ven: 7.388 — ABNORMAL HIGH (ref 7.250–7.300)
pO2, Ven: 29 mmHg — CL (ref 30.0–45.0)
pO2, Ven: 32 mmHg (ref 30.0–45.0)

## 2010-12-11 ENCOUNTER — Encounter: Payer: Self-pay | Admitting: Internal Medicine

## 2010-12-11 ENCOUNTER — Ambulatory Visit (INDEPENDENT_AMBULATORY_CARE_PROVIDER_SITE_OTHER): Payer: Medicare Other | Admitting: Internal Medicine

## 2010-12-11 VITALS — BP 106/72 | HR 64 | Ht 67.0 in | Wt 170.0 lb

## 2010-12-11 DIAGNOSIS — I2510 Atherosclerotic heart disease of native coronary artery without angina pectoris: Secondary | ICD-10-CM

## 2010-12-11 DIAGNOSIS — I421 Obstructive hypertrophic cardiomyopathy: Secondary | ICD-10-CM

## 2010-12-11 DIAGNOSIS — I2789 Other specified pulmonary heart diseases: Secondary | ICD-10-CM

## 2010-12-11 DIAGNOSIS — I10 Essential (primary) hypertension: Secondary | ICD-10-CM

## 2010-12-11 DIAGNOSIS — I6529 Occlusion and stenosis of unspecified carotid artery: Secondary | ICD-10-CM

## 2010-12-11 MED ORDER — DILTIAZEM HCL ER COATED BEADS 120 MG PO CP24: 120.0000 mg | ORAL_CAPSULE | Freq: Every day | ORAL | Status: DC

## 2010-12-11 MED ORDER — PREDNISONE 5 MG PO TABS: 5.0000 mg | ORAL_TABLET | Freq: Every day | ORAL | Status: DC

## 2010-12-11 NOTE — Assessment & Plan Note (Addendum)
Due to severe COPD. Encouraged her to wear O2 with all exertion. (not wearing in office today). No role for selective pulmonary vasodilators.

## 2010-12-11 NOTE — Assessment & Plan Note (Signed)
Remains asymptomatic. Due for f/u ultrasound this month. Continue risk factor management.

## 2010-12-11 NOTE — Assessment & Plan Note (Addendum)
BP well controlled but I worry that bisoprolol may be contributing to her wheezing. Will stop and switch to cardizem.

## 2010-12-11 NOTE — Progress Notes (Signed)
HPI: [name] is a delightful 77 year old woman with a history of COPD, hypertension, hyperlipidemia, and peripheral arterial disease.  She is status post multiple CVAs in the setting of a previous left carotid stenosis, which she has underwent endarterectomy before at [facility] in 2005. She denies any history of known coronary artery disease.She does have an abnormal ECG with inferior and lateral Q waves but Myoview and echo have been normal.  She has had ABIS, ab u/s and carotid u/s. ABIs and abdominal u/s were normal. Carotids showed 60-79% R and 40-59% on L.Cath showed Mild non-obs CAD with ~50% ostial RCA lesion. Left system normal. EF 70%. Mild PAH. Right atrial pressure mean of 8, RV pressure 43/6 with an EDP of 14, PA pressure 42/17 with a mean of 29, pulmonary capillary wedge pressure was mean of 15.  LV was 162/10 with an EDP of 20. Central aortic pressure 154/70 with a mean of 101.  Fick cardiac output 5.0 liters per minute.  Cardiac index 2.6 liters per minute per meter squared.  Pulmonary vascular resistance was 2.8 Woods units.  Continues to struggle with her COPD. Maintained on prednisone. Gets SOB on mild walking. Continues to do Nu-Step 30-60 mins without problem. No TIAs.    ROS: All systems negative except as listed in HPI, PMH and Problem List.  Past Medical History  Diagnosis Date  . COPD (chronic obstructive pulmonary disease)   . Hypertrophic obstructive cardiomyopathy     Echo 10/10 EF 65-70% SW 1.6 PW 1.4 mild SAM no LVOT gradient at rest. Valsalva not performed Mild MR. No hyperenhance ment on MRI EF 69%.  . Coronary atherosclerosis of native coronary artery     Cath 1/11: RCA 50-60 ostial o/w normal. EF 70%.   . Pulmonary hypertension     Cath 1/11 RA 8, RV 43/6/14, PA 42/17 (29) PCWP 15 Fick  5.0/2.6. PVR 2.8    . Edema   . Breast cancer   . Occlusion and stenosis of carotid artery     s/p L CEA u/s 3/11. R 60-79%; L 40-59%. no change since 3/10  . Peripheral vascular  disease, unspecified   . Hypertension   . Dyspnea   . Emphysema   . Asthma   . Hypothyroidism   . Stroke   . Allergic rhinitis     Current Outpatient Prescriptions  Medication Sig Dispense Refill  . albuterol (PROVENTIL HFA;VENTOLIN HFA) 108 (90 BASE) MCG/ACT inhaler Inhale 2 puffs into the lungs every 6 (six) hours as needed.        Marland Kitchen aspirin 81 MG EC tablet Take 81 mg by mouth daily.        Marland Kitchen atorvastatin (LIPITOR) 20 MG tablet Take 20 mg by mouth daily.        . bisoprolol-hydrochlorothiazide (ZIAC) 5-6.25 MG per tablet Take 1 tablet by mouth daily.        . Calcium Carbonate (CALCIUM 600) 1500 MG TABS Take 1 tablet by mouth daily.        Marland Kitchen darifenacin (ENABLEX) 7.5 MG 24 hr tablet Take 7.5 mg by mouth daily.        . furosemide (LASIX) 20 MG tablet Take 20 mg by mouth daily.        Marland Kitchen levothyroxine (SYNTHROID, LEVOTHROID) 112 MCG tablet Take 112 mcg by mouth daily.        Marland Kitchen levothyroxine (SYNTHROID, LEVOTHROID) 50 MCG tablet Take 50 mcg by mouth daily.        . Multiple Vitamins-Minerals (  ONE-A-DAY EXTRAS ANTIOXIDANT) CAPS Take 1 capsule by mouth daily.        Marland Kitchen tiotropium (SPIRIVA) 18 MCG inhalation capsule Place 18 mcg into inhaler and inhale daily.        . budesonide-formoterol (SYMBICORT) 160-4.5 MCG/ACT inhaler Inhale 2 puffs into the lungs 2 (two) times daily.        . fexofenadine (ALLEGRA) 180 MG tablet Take 180 mg by mouth daily.        Marland Kitchen omeprazole (PRILOSEC) 20 MG capsule Take 20 mg by mouth daily.        . predniSONE (DELTASONE) 10 MG tablet Take 10 mg by mouth daily.           PHYSICAL EXAM: Filed Vitals:   12/11/10 1502  BP: 106/72  Pulse: 64   General:  Well appearing. No resp difficulty HEENT: normal Neck: supple. JVP flat. Carotids 2+ bilaterally; no bruits. No lymphadenopathy or thryomegaly appreciated. Cor: PMI normal. Regular rate & rhythm. No rubs, gallops or murmurs. Lungs: clear Abdomen: soft, nontender, nondistended. No hepatosplenomegaly. No  bruits or masses. Good bowel sounds. Extremities: no cyanosis, clubbing, rash, edema Neuro: alert & orientedx3, cranial nerves grossly intact. Moves all 4 extremities w/o difficulty. Affect pleasant.    ECG:   ASSESSMENT & PLAN:

## 2010-12-11 NOTE — Assessment & Plan Note (Signed)
No evidence of ischemia. Continue risk factor modification.

## 2010-12-11 NOTE — Patient Instructions (Signed)
STOP taking Ziac. START Diltiazem 120mg  once daily.  Your physician recommends that you schedule a follow-up appointment in: 6 months

## 2010-12-11 NOTE — Assessment & Plan Note (Addendum)
Stable. No syncope or hypotension. Stopping b-blocker due to COPD. Will use cardizem CD 120 daily.

## 2010-12-31 ENCOUNTER — Other Ambulatory Visit: Payer: Self-pay | Admitting: Cardiology

## 2010-12-31 DIAGNOSIS — I6529 Occlusion and stenosis of unspecified carotid artery: Secondary | ICD-10-CM

## 2011-01-01 ENCOUNTER — Encounter (INDEPENDENT_AMBULATORY_CARE_PROVIDER_SITE_OTHER): Payer: Medicare Other | Admitting: *Deleted

## 2011-01-01 DIAGNOSIS — I6529 Occlusion and stenosis of unspecified carotid artery: Secondary | ICD-10-CM

## 2011-01-02 ENCOUNTER — Encounter: Payer: Self-pay | Admitting: Internal Medicine

## 2011-01-11 ENCOUNTER — Telehealth: Payer: Self-pay | Admitting: *Deleted

## 2011-01-11 NOTE — Telephone Encounter (Signed)
01/11/11--1600pm--called pt and gave results carotids--nt

## 2011-01-29 NOTE — Assessment & Plan Note (Signed)
[facility] OFFICE NOTE   NAME:POWELLQuinita, [name]                         MRN:          [id number]  DATE:09/30/2008                            DOB:          06-28-1933    REFERRING PHYSICIAN:  Leslye Peer, MD   PRIMARY CARE PHYSICIAN:  Dr. Beverely Larson.   REASON FOR CONSULTATION:  Dyspnea evaluate for underlying heart disease.   Ms. [name] is a delightful 77 year old woman with a history of COPD,  hypertension, hyperlipidemia, and peripheral arterial disease.  She is  status post multiple CVAs in the setting of a previous left carotid  stenosis, which she has underwent endarterectomy before at [facility] in 2005.  She denies any history of known coronary artery disease.   She was admitted to the hospital in November 2009 with COPD flare and  possible pneumonia.  During that time, she developed some lower  extremity edema and concerning for possible overlying heart failure.  She was started on Lasix with good resolution of her edema.  She did see  Dr. Welton Larson in followup and had a nuclear stress test which showed an EF of  74% with no wall motion abnormalities.  There is no ischemia or infarct.  She was also referred to Dr. Delton Larson for further evaluation of her COPD  and possible underlying pulmonary hypertension.  He ordered an  echocardiogram and referred her here for evaluation.   Echocardiogram which I have reviewed personally shows an EF of 60-65%.  There are no regional wall motion abnormalities.  The right heart is  normal.  There is no RV dilatation or dysfunction.  The right atrium is  also normal.  There is mild tricuspid regurgitation with estimated peak  RV pressure of 39, which is just mildly elevated.   Symptomatically, Ms. [name] is quite short of breath.  She can only walk  a few feet, before she gets winded.  She does get occasional sharp chest  pains but these are not always reproducible.  She denies any  orthopnea.  No PND.  She has not had any recurrent lower extremity edema.  No  palpitations.   REVIEW OF SYSTEMS:  Notable for fatigue, thyroid disease, possible  depression, allergies, and arthritis pain.  Remainder of review of  systems is negative.  She denies any claudication.   She has been prescribed home O2, but she is not very compliant with this  especially at night.   PAST MEDICAL HISTORY:  1. COPD.      a.     Stopped smoking in 2000.  2. Hypertension.  3. Hyperlipidemia.  4. History of multiple CVAs.      a.     Status post left carotid endarterectomy.      b.     History of frontal AVMs, which is being watched.  5. Recent development of lower extremity edema.  6. Hypothyroidism.   CURRENT MEDICATIONS:  1. Levoxyl 150 mcg a day.  2. Lipitor 20 a day.  3. Bisoprolol HCTZ 5/6.25.  4. Fexofenadine 180 a day.  5. Enablex  7.5 a day.  6. Multivitamin.  7. Calcium.  8. Aspirin 81.  9. Lasix 20 a day.  10.Advair Diskus.  11.Spiriva inhaler.  12.Fosamax.   ALLERGIES:  No known drug allergies.   SOCIAL HISTORY:  She is single with no children.  She is here today with  her niece.  She has a long history of tobacco, but quit 10 years ago.  She is retired.  Occasional alcohol.   FAMILY HISTORY:  Her mother died at 56 due to multiple strokes.  Father  died at 17 due to COPD and she has a sister who is alive with pulmonary  hypertension and brother is alive as well.  She does have a sister who  has passed.  There is no family history of significant coronary artery  disease.   PHYSICAL EXAMINATION:  GENERAL:  She is in no acute distress.  She  ambulates around the clinic without any significant respiratory  difficulty.  VITAL SIGNS:  Blood pressure is 122/76, heart rate is 72, weight is 173,  while standing her pulse oxygen is about 93, with talking, however, goes  down to about 88, when walking her pulse ox goes to the high 80s and  down to low 80s with talking and  slow ambulation.  HEENT:  Notable for mild periorbital cyanosis, otherwise normal.  NECK:  Supple.  No obvious JVD.  Carotids are 2+ bilaterally.  She has a  carotid endarterectomy scar in the left.  I do not hear any bruits.  There is no adenopathy or thyromegaly appreciated.  CARDIAC:  PMI is nondisplaced that she has distant heart sounds.  She is  regular with no obvious murmurs, rubs, or gallops.  LUNGS:  Mildly diminished throughout, but no wheezing or rales.  ABDOMEN:  Obese, nontender, and nondistended.  Unable to appreciate the  abdominal aortic impulse.  I do not hear bruits.  There is no  hepatosplenomegaly.  EXTREMITIES:  Warm.  There is no edema.  She does have just minimal  cyanosis and some mild clubbing.  There is no rash.  Distal pulses are  nonpalpable.  NEUROLOGIC:  Alert and oriented x3.  Cranial nerves II through XII are  intact.  Moves all 4 extremities without difficulty.  Affect is  pleasant.   EKG shows sinus rhythm at a rate of 72.  There are anterior Q-waves from  a V1 through V5 as well as inferior Q-waves from II and aVF.  No  significant ST-T wave abnormalities.   ASSESSMENT AND PLAN:  1. Dyspnea.  I suspect this is primarily due to her chronic      obstructive pulmonary disease.  She has a markedly abnormal EKG      suggestive of previous infarct.  However, echocardiogram and her      Myoview refute this.  We had a long talk about the possibility of      doing a heart catheterization to definitively evaluate.  However,      given that, she is not having significant angina, and she is      probably not a bypass candidate, I do not see a clear benefit to      proceeding with angiography in the light of normal stress test and      echocardiogram.  Thus we will just continue with risk factor      management.  Based on her echo, she does not have significant      pulmonary hypertension.  I did, however,  explained to her the      importance of being more  compliant with her oxygen in order to      avoid the development of secondary pulmonary hypertension.  We will      put a overnight pulse oximeter on her to evaluate for overnight      hypoxemia.  She is quite reluctant to wear her oxygen at night.  2. Carotid artery disease.  We will follow up on her carotid      ultrasounds.  3. Peripheral arterial disease.  She currently is not experiencing      claudication, but on exam has significant peripheral disease.  I      suspect the fact that her lungs just limit her too much to get to      the point where she has claudication.  We will check ankle-brachial      indexes with lower extremity ultrasound and also abdominal aorta to      make sure she does not have an aneurysm.  4. Hypertension.  Blood pressure is well controlled.  5. Hyperlipidemia.  This is followed by Dr. Welton Larson, given her vascular      disease.  I would recommend titrating her statin to keep her LDL      under 70.   DISPOSITION:  We will see her back in 4 months for followup.   Total time spent was 1 hour.     Bevelyn Buckles. Bensimhon, MD  Electronically Signed    DRB/MedQ  DD: 09/30/2008  DT: 10/01/2008  Job #: [id number]   cc:   Victoria Peer, MD

## 2011-02-14 ENCOUNTER — Other Ambulatory Visit (INDEPENDENT_AMBULATORY_CARE_PROVIDER_SITE_OTHER): Payer: Medicare Other

## 2011-02-14 ENCOUNTER — Ambulatory Visit (INDEPENDENT_AMBULATORY_CARE_PROVIDER_SITE_OTHER)
Admission: RE | Admit: 2011-02-14 | Discharge: 2011-02-14 | Disposition: A | Discharge status: Home / Self Care | Payer: Medicare Other | Source: Ambulatory Visit | Attending: Adult Health | Admitting: Adult Health

## 2011-02-14 ENCOUNTER — Encounter: Payer: Self-pay | Admitting: Adult Health

## 2011-02-14 ENCOUNTER — Ambulatory Visit (INDEPENDENT_AMBULATORY_CARE_PROVIDER_SITE_OTHER): Payer: Medicare Other | Admitting: Adult Health

## 2011-02-14 DIAGNOSIS — R0602 Shortness of breath: Secondary | ICD-10-CM

## 2011-02-14 DIAGNOSIS — J441 Chronic obstructive pulmonary disease with (acute) exacerbation: Secondary | ICD-10-CM

## 2011-02-14 DIAGNOSIS — R079 Chest pain, unspecified: Secondary | ICD-10-CM

## 2011-02-14 DIAGNOSIS — E039 Hypothyroidism, unspecified: Secondary | ICD-10-CM

## 2011-02-14 LAB — TSH: TSH: 1.34 u[IU]/mL (ref 0.35–5.50)

## 2011-02-14 LAB — CBC WITH DIFFERENTIAL/PLATELET
Basophils Relative: 0.8 % (ref 0.0–3.0)
Eosinophils Relative: 0.7 % (ref 0.0–5.0)
HCT: 21.1 % — CL (ref 36.0–46.0)
Hemoglobin: 6.6 g/dL — CL (ref 12.0–15.0)
Lymphs Abs: 2.4 10*3/uL (ref 0.7–4.0)
Monocytes Relative: 8.3 % (ref 3.0–12.0)
Neutro Abs: 7.3 10*3/uL (ref 1.4–7.7)
RBC: 2.45 Mil/uL — ABNORMAL LOW (ref 3.87–5.11)
RDW: 14.5 % (ref 11.5–14.6)
WBC: 10.8 10*3/uL — ABNORMAL HIGH (ref 4.5–10.5)

## 2011-02-14 LAB — BASIC METABOLIC PANEL
CO2: 33 mEq/L — ABNORMAL HIGH (ref 19–32)
Chloride: 102 mEq/L (ref 96–112)
Creatinine, Ser: 0.6 mg/dL (ref 0.4–1.2)

## 2011-02-14 MED ORDER — PREDNISONE 5 MG PO TABS: 5.0000 mg | ORAL_TABLET | Freq: Every day | ORAL | Status: DC

## 2011-02-14 MED ORDER — ALBUTEROL SULFATE HFA 108 (90 BASE) MCG/ACT IN AERS: 2.0000 | INHALATION_SPRAY | Freq: Four times a day (QID) | RESPIRATORY_TRACT | Status: DC | PRN

## 2011-02-14 MED ORDER — BUDESONIDE-FORMOTEROL FUMARATE 160-4.5 MCG/ACT IN AERO: 2.0000 | INHALATION_SPRAY | Freq: Two times a day (BID) | RESPIRATORY_TRACT | Status: DC

## 2011-02-14 MED ORDER — TIOTROPIUM BROMIDE MONOHYDRATE 18 MCG IN CAPS: 18.0000 ug | ORAL_CAPSULE | Freq: Every day | RESPIRATORY_TRACT | Status: DC

## 2011-02-14 NOTE — Patient Instructions (Addendum)
Increase Lasix 20mg  2 tabs daily for 3 days then back to 1 daily  Increase Prednisone 5mg  4 tabs for 3 days then 3 tabs daily for 3 days then 2 tabs x 3 days then back to 5mg  daily  I will call with labs and xray results.  Low salt diet.  We are referring you to cardiology to evaluate heart reate and chest tightness. -follow up next week as planned if needed can call sooner for work in visit.  follow up DR. Byrum in 2 weeks.  Please contact office for sooner follow up if symptoms do not improve or worsen or seek emergency care  We are contacting [facility] to evaluate you for a portable concentrator.    Late add: 02/14/2011 1700, call report that HBG is 6.6 Pt was contacted and informed of results. She says she  Has hx of Anemia w/ colon AVMs followed by GI in [place] Dr. Mechele Collin. Has had previous blood transfusion  In past.  Informed her she needs to go to ER for evaluation /labs w/  Possible admission [facility]- she has declined and wants to  Wait to am to speak with DR. Elliott . Advised of danger of severely low  Hbg. She verbalized understanding.

## 2011-02-14 NOTE — Progress Notes (Signed)
Subjective:    Patient ID: [name], female    DOB: Jul 20, 1933, 77 y.o.   MRN: [id number]  HPI 32 former smoker, dx with COPD about 1999, also hx breast CA, CVA with R frontal AVM, colonic AVM's. Also diastolic dysfxn followed Dr Gala Romney. Has been treated with Spiriva + Advair, since 2003.   ROV 05/15/10 -- COPD. Last visit we treated for an exacerbation. She is much improved. Still with wheezing in the morning. Some am dyspnea. Cough better. Using SABA about once daily.   ROV 05/23/10 -- She feels like she caught a cold last week. She has been more short of breath. Also getting more cough with yellow sputum. She has been getting chest tightness and more wheeze. She has felt warm, but has not checked her temp. She denies hemoptysis. She has not had abdominal symptoms or leg swelling. Her sinuses and throat have been okay. She has been using her ventolin more. She has tried a Zpak before, but this does not seem to help.   June 28, 2010 -Presents for a 2 week follow up. On Prednisone 10mg  1/2 every other day. Seen 06/02/10 for acute COPD exacerbation , tx w/ Levaquin and steroids. She is only minimally improved with cough and wheeizng. Mucus thick at times , unable to get up . Xray last visit was w/ no acute changes. Has some reflux and heartburn. Denies chest pain, orthopnea, hemoptysis, fever, n/v/d, edema, headache.   ROV 08/14/10 -- COPD, treated w pred taper 06/28/10 for persistent obstructive symptoms. Has been treated w abx/pred. Now better, in retrospect ? allergies, ? inhaled exposure. Now off Pred. Advair + Spiriva. No flu shot yet. hasn't needed SABA in several days.   September 21, 2010--Presents for an acute office visit. Complains of wheezing, increased SOB, dry cough x2-3days. Complains of last 4-5 months has been on abx and steroids each month. As soon as she finishes her course symptoms begin to come back with cough, wheezing and dyspnea. Last 2 days, dry cough w/ wheezing. No  discolored mucus. Denies chest pain, orthopnea, hemoptysis, fever, n/v/d, edema, headache.   10/30/10 -- follow up severe COPD. Flare last visit, treated with pred and abx, changed Advair to Symbicort to see if this helped with UA irritation and cough. We left her on Pred 10mg  by mouth once daily with plans to f/u and decide whether to stay on chronically.   02/14/11 Acute OV  Pt presents for a work in visit. Complains of increased SOB w/ exertion and sats into 70s w/ exertion, HR varying from the upper 40s to the 120s. Pt complains that over last couple of weeks she feels tired , low energy and more short of breath with walking. She has marathon helios that she has noticed she desats with walking into the 70s. She uses the demand setting. On the continuous flow she does not desat. Today in the office no desaturation with walking on 3 l/m continuous flow. HR ranges 100-120 with walking and at rest. On her portable oximeter her HR avg 40-120 , in the office HR maintained ~106-120 with walking . EKG w/ no acute changes w/ HR ~90 at rest.   Does feel tight in chest with walking. Has some occasional wheezing. No discolored mucus or fever. Ankles have been puffy for last several days.   Recently changed from her bisoprolol to cardizem by cardiology.   Review of Systems Constitutional:   No  weight loss, night sweats,  Fevers, chills, fatigue, or  lassitude.  HEENT:   No headaches,  Difficulty swallowing,  Tooth/dental problems, or  Sore throat,                No sneezing, itching, ear ache, nasal congestion, post nasal drip,   CV:  No chest pain,  Orthopnea, PND,  , anasarca, dizziness, palpitations, syncope.   GI  No heartburn, indigestion, abdominal pain, nausea, vomiting, diarrhea, change in bowel habits, loss of appetite, bloody stools.   Resp:   No excess mucus, no productive cough,  No non-productive cough,  No coughing up of blood.  No change in color of mucus.    Skin: no rash or  lesions.  GU: no dysuria, change in color of urine, no urgency or frequency.  No flank pain, no hematuria   MS:  No joint pain or swelling.  No decreased range of motion.     Psych:  No change in mood or affect. No depression or anxiety.  No memory loss.         Objective:   Physical Exam GEN: A/Ox3; pleasant , NAD, elderly female   HEENT:  [place]/AT,  EACs-clear, TMs-wnl, NOSE-clear, THROAT-clear, no lesions, no postnasal drip or exudate noted.   NECK:  Supple w/ fair ROM; no JVD; normal carotid impulses w/o bruits; no thyromegaly or nodules palpated; no lymphadenopathy.  RESP  Coarse BS w/ faint exp wheezeno accessory muscle use, no dullness to percussion  CARD:  RRR, Gr 1/6 SM  Tr-1 + peripheral edema, pulses intact, no cyanosis or clubbing.  GI:   Soft & nt; nml bowel sounds; no organomegaly or masses detected.  Musco: Warm bil, no deformities or joint swelling noted.   Neuro: alert, no focal deficits noted.    Skin: Warm, no lesions or rashes         Assessment & Plan:

## 2011-02-14 NOTE — Assessment & Plan Note (Signed)
Mild flare with associated desaturations on demand setting on [place] Will check labs along with cxr   Plan:  ncrease Lasix 20mg  2 tabs daily for 3 days then back to 1 daily  Increase Prednisone 5mg  4 tabs for 3 days then 3 tabs daily for 3 days then 2 tabs x 3 days then back to 5mg  daily  I will call with labs and xray results.  Low salt diet.  We are referring you to cardiology to evaluate heart reate and chest tightness.  follow up DR. Byrum in 2 weeks.  Please contact office for sooner follow up if symptoms do not improve or worsen or seek emergency care  We are contacting [facility] to evaluate you for a portable concentrator.

## 2011-02-15 ENCOUNTER — Inpatient Hospital Stay: Payer: Medicare Other | Admitting: Internal Medicine

## 2011-02-15 HISTORY — PX: CATARACT EXTRACTION W/ INTRAOCULAR LENS IMPLANT: SHX1309

## 2011-02-21 ENCOUNTER — Ambulatory Visit: Payer: Medicare Other | Admitting: Physician Assistant

## 2011-03-05 ENCOUNTER — Ambulatory Visit (INDEPENDENT_AMBULATORY_CARE_PROVIDER_SITE_OTHER): Payer: Medicare Other | Admitting: Emergency Medicine

## 2011-03-05 ENCOUNTER — Encounter: Payer: Self-pay | Admitting: Emergency Medicine

## 2011-03-05 ENCOUNTER — Other Ambulatory Visit: Payer: Medicare Other

## 2011-03-05 DIAGNOSIS — J449 Chronic obstructive pulmonary disease, unspecified: Secondary | ICD-10-CM

## 2011-03-05 DIAGNOSIS — D649 Anemia, unspecified: Secondary | ICD-10-CM

## 2011-03-05 MED ORDER — PREDNISONE 1 MG PO TABS: ORAL_TABLET | ORAL | Status: DC

## 2011-03-05 NOTE — Assessment & Plan Note (Signed)
CBC today.  

## 2011-03-05 NOTE — Progress Notes (Signed)
Subjective:    Patient ID: [name], female    DOB: Mar 30, 1933, 77 y.o.   MRN: [id number]  HPI 44 former smoker, dx with COPD about 1999, also hx breast CA, CVA with R frontal AVM, colonic AVM's. Also diastolic dysfxn followed Dr Gala Romney. Has been treated with Spiriva + Advair, since 2003.   September 21, 2010--Presents for an acute office visit. Complains of wheezing, increased SOB, dry cough x2-3days. Complains of last 4-5 months has been on abx and steroids each month. As soon as she finishes her course symptoms begin to come back with cough, wheezing and dyspnea. Last 2 days, dry cough w/ wheezing. No discolored mucus. Denies chest pain, orthopnea, hemoptysis, fever, n/v/d, edema, headache.   10/30/10 -- follow up severe COPD. Flare last visit, treated with pred and abx, changed Advair to Symbicort to see if this helped with UA irritation and cough. We left her on Pred 10mg  by mouth once daily with plans to f/u and decide whether to stay on chronically.   02/14/11 Acute OV  Pt presents for a work in visit. Complains of increased SOB w/ exertion and sats into 70s w/ exertion, HR varying from the upper 40s to the 120s. Pt complains that over last couple of weeks she feels tired , low energy and more short of breath with walking. She has marathon helios that she has noticed she desats with walking into the 70s. She uses the demand setting. On the continuous flow she does not desat. Today in the office no desaturation with walking on 3 l/m continuous flow. HR ranges 100-120 with walking and at rest. On her portable oximeter her HR avg 40-120 , in the office HR maintained ~106-120 with walking . EKG w/ no acute changes w/ HR ~90 at rest. Does feel tight in chest with walking. Has some occasional wheezing. No discolored mucus or fever. Ankles have been puffy for last several days.  Recently changed from her bisoprolol to cardizem by cardiology.  ROV 03/05/11 -- severe COPD w hypoxemia. As above, recently  seen for DOE and labile HR. Since last time was admitted for anemia, Hgb 6.0. In after math feels her breathing and fatigue are improved. Returns today for f/u. She had to have AVM's cauterized. Has been on chronic pred for last 6 months, currently on 5mg  daily. Her goal is to taper to zero. Currently on Symbicort, Spiriva, uses SABA prn not every day.    Review of Systems Constitutional:   No  weight loss, night sweats,  Fevers, chills, fatigue, or  lassitude.  HEENT:   No headaches,  Difficulty swallowing,  Tooth/dental problems, or  Sore throat,                No sneezing, itching, ear ache, nasal congestion, post nasal drip,   CV:  No chest pain,  Orthopnea, PND,  , anasarca, dizziness, palpitations, syncope.   GI  No heartburn, indigestion, abdominal pain, nausea, vomiting, diarrhea, change in bowel habits, loss of appetite, bloody stools.   Resp:   No excess mucus, no productive cough,  No non-productive cough,  No coughing up of blood.  No change in color of mucus.    Skin: no rash or lesions.  GU: no dysuria, change in color of urine, no urgency or frequency.  No flank pain, no hematuria   MS:  No joint pain or swelling.  No decreased range of motion.     Psych:  No change in mood or affect. No  depression or anxiety.  No memory loss.      Objective:   Physical Exam GEN: A/Ox3; pleasant , NAD, elderly female   HEENT:  /AT,  EACs-clear, TMs-wnl, NOSE-clear, THROAT-clear, no lesions, no postnasal drip or exudate noted.   NECK:  Supple w/ fair ROM; no JVD; normal carotid impulses w/o bruits; no thyromegaly or nodules palpated; no lymphadenopathy.  RESP  No wheezeno accessory muscle use, no dullness to percussion  CARD:  RRR, Gr 1/6 SM  Tr-1 + peripheral edema, pulses intact, no cyanosis or clubbing.  Musco: Warm bil, no deformities or joint swelling noted.   Neuro: alert, no focal deficits noted.    Skin: Warm, no lesions or rashes   Assessment & Plan:

## 2011-03-05 NOTE — Patient Instructions (Signed)
Decrease your prednisone to 3mg  daily Continue your same inhaled medications and oxygen We will check your blood counts today Follow up with Dr Delton Coombes in 1 month

## 2011-03-05 NOTE — Assessment & Plan Note (Signed)
Will taper pred, change to 3mg  qd now, plan to decrease monthly to off Same BDs O2

## 2011-03-21 ENCOUNTER — Ambulatory Visit: Payer: Medicare Other | Admitting: Family Medicine

## 2011-03-27 ENCOUNTER — Inpatient Hospital Stay: Payer: Medicare Other | Admitting: Internal Medicine

## 2011-04-03 ENCOUNTER — Ambulatory Visit (INDEPENDENT_AMBULATORY_CARE_PROVIDER_SITE_OTHER): Payer: Medicare Other | Admitting: Emergency Medicine

## 2011-04-03 ENCOUNTER — Encounter: Payer: Self-pay | Admitting: Emergency Medicine

## 2011-04-03 DIAGNOSIS — J4489 Other specified chronic obstructive pulmonary disease: Secondary | ICD-10-CM

## 2011-04-03 DIAGNOSIS — K219 Gastro-esophageal reflux disease without esophagitis: Secondary | ICD-10-CM

## 2011-04-03 DIAGNOSIS — J449 Chronic obstructive pulmonary disease, unspecified: Secondary | ICD-10-CM

## 2011-04-03 MED ORDER — PANTOPRAZOLE SODIUM 40 MG PO TBEC: 40.0000 mg | DELAYED_RELEASE_TABLET | Freq: Two times a day (BID) | ORAL | Status: DC

## 2011-04-03 NOTE — Assessment & Plan Note (Signed)
Stable on Pred 3mg . At this time I would like to continue on the same until the anemia issue is settled - same BDs

## 2011-04-03 NOTE — Patient Instructions (Signed)
Continue your prednisone 3mg  daily Continue your same inhaled medications and oxygen Follow up with Dr Delton Coombes in 2 months.

## 2011-04-03 NOTE — Progress Notes (Signed)
Subjective:    Patient ID: [name], female    DOB: 09/10/1933, 77 y.o.   MRN: [id number]  HPI 56 former smoker, dx with COPD about 1999, also hx breast CA, CVA with R frontal AVM, colonic AVM's. Also diastolic dysfxn followed Dr Gala Romney. Has been treated with Spiriva + Advair, since 2003.   September 21, 2010--Presents for an acute office visit. Complains of wheezing, increased SOB, dry cough x2-3days. Complains of last 4-5 months has been on abx and steroids each month. As soon as she finishes her course symptoms begin to come back with cough, wheezing and dyspnea. Last 2 days, dry cough w/ wheezing. No discolored mucus. Denies chest pain, orthopnea, hemoptysis, fever, n/v/d, edema, headache.   10/30/10 -- follow up severe COPD. Flare last visit, treated with pred and abx, changed Advair to Symbicort to see if this helped with UA irritation and cough. We left her on Pred 10mg  by mouth once daily with plans to f/u and decide whether to stay on chronically.   02/14/11 Acute OV  Pt presents for a work in visit. Complains of increased SOB w/ exertion and sats into 70s w/ exertion, HR varying from the upper 40s to the 120s. Pt complains that over last couple of weeks she feels tired , low energy and more short of breath with walking. She has marathon helios that she has noticed she desats with walking into the 70s. She uses the demand setting. On the continuous flow she does not desat. Today in the office no desaturation with walking on 3 l/m continuous flow. HR ranges 100-120 with walking and at rest. On her portable oximeter her HR avg 40-120 , in the office HR maintained ~106-120 with walking . EKG w/ no acute changes w/ HR ~90 at rest. Does feel tight in chest with walking. Has some occasional wheezing. No discolored mucus or fever. Ankles have been puffy for last several days.  Recently changed from her bisoprolol to cardizem by cardiology.  ROV 03/05/11 -- severe COPD w hypoxemia. As above, recently  seen for DOE and labile HR. Since last time was admitted for anemia, Hgb 6.0. In after math feels her breathing and fatigue are improved. Returns today for f/u. She had to have AVM's cauterized. Has been on chronic pred for last 6 months, currently on 5mg  daily. Her goal is to taper to zero. Currently on Symbicort, Spiriva, uses SABA prn not every day.   ROV 04/03/11 -- severe COPD. Last time we decreased Pred to 3mg  qd. Since our visit she was readmitted for anemia, she is being followed by GI (Dr Mechele Collin). She has had endoscopy, CSY, capsule endoscopy. She feels washed out from the anemia. No real breathing change, wheezing, coughing.    Objective:   Physical Exam GEN: A/Ox3; pleasant , NAD, elderly female   HEENT:  [place]/AT,  EACs-clear, TMs-wnl, NOSE-clear, THROAT-clear, no lesions, no postnasal drip or exudate noted.   NECK:  Supple w/ fair ROM; no JVD; normal carotid impulses w/o bruits; no thyromegaly or nodules palpated; no lymphadenopathy.  RESP  No wheezeno accessory muscle use, no dullness to percussion  CARD:  RRR, Gr 1/6 SM  Tr-1 + peripheral edema, pulses intact, no cyanosis or clubbing.  Musco: Warm bil, no deformities or joint swelling noted.   Neuro: alert, no focal deficits noted.    Skin: Warm, no lesions or rashes   Assessment & Plan:  COPD Stable on Pred 3mg . At this time I would like to continue on  the same until the anemia issue is settled - same BDs

## 2011-04-09 ENCOUNTER — Ambulatory Visit: Payer: Medicare Other | Admitting: Ophthalmology

## 2011-06-04 ENCOUNTER — Encounter (INDEPENDENT_AMBULATORY_CARE_PROVIDER_SITE_OTHER): Payer: Medicare Other | Admitting: Emergency Medicine

## 2011-06-04 ENCOUNTER — Encounter: Payer: Self-pay | Admitting: Emergency Medicine

## 2011-06-04 VITALS — BP 138/80 | HR 90 | Temp 97.8°F | Wt 167.8 lb

## 2011-06-04 DIAGNOSIS — Z23 Encounter for immunization: Secondary | ICD-10-CM

## 2011-06-04 DIAGNOSIS — J449 Chronic obstructive pulmonary disease, unspecified: Secondary | ICD-10-CM

## 2011-07-01 ENCOUNTER — Other Ambulatory Visit: Payer: Self-pay | Admitting: Cardiology

## 2011-07-01 DIAGNOSIS — I6529 Occlusion and stenosis of unspecified carotid artery: Secondary | ICD-10-CM

## 2011-07-02 ENCOUNTER — Encounter (INDEPENDENT_AMBULATORY_CARE_PROVIDER_SITE_OTHER): Payer: Medicare Other | Admitting: *Deleted

## 2011-07-02 DIAGNOSIS — I6529 Occlusion and stenosis of unspecified carotid artery: Secondary | ICD-10-CM

## 2011-07-05 ENCOUNTER — Other Ambulatory Visit: Payer: Self-pay | Admitting: Internal Medicine

## 2011-07-05 DIAGNOSIS — I6529 Occlusion and stenosis of unspecified carotid artery: Secondary | ICD-10-CM

## 2011-07-15 ENCOUNTER — Ambulatory Visit: Payer: Medicare Other | Admitting: Emergency Medicine

## 2011-07-29 ENCOUNTER — Inpatient Hospital Stay (HOSPITAL_COMMUNITY): Payer: Medicare Other

## 2011-07-29 ENCOUNTER — Other Ambulatory Visit: Payer: Self-pay | Admitting: Internal Medicine

## 2011-07-29 ENCOUNTER — Encounter (HOSPITAL_COMMUNITY): Payer: Self-pay

## 2011-07-29 ENCOUNTER — Encounter: Payer: Self-pay | Admitting: Emergency Medicine

## 2011-07-29 ENCOUNTER — Inpatient Hospital Stay (HOSPITAL_COMMUNITY)
Admission: AD | Admit: 2011-07-29 | Discharge: 2011-08-01 | DRG: 191 | Disposition: A | Discharge status: Home / Self Care | Payer: Medicare Other | Source: Ambulatory Visit | Attending: Emergency Medicine | Admitting: Emergency Medicine

## 2011-07-29 ENCOUNTER — Ambulatory Visit (INDEPENDENT_AMBULATORY_CARE_PROVIDER_SITE_OTHER): Payer: Medicare Other | Admitting: Emergency Medicine

## 2011-07-29 DIAGNOSIS — J449 Chronic obstructive pulmonary disease, unspecified: Secondary | ICD-10-CM | POA: Diagnosis present

## 2011-07-29 DIAGNOSIS — J441 Chronic obstructive pulmonary disease with (acute) exacerbation: Principal | ICD-10-CM | POA: Diagnosis present

## 2011-07-29 DIAGNOSIS — E039 Hypothyroidism, unspecified: Secondary | ICD-10-CM | POA: Diagnosis present

## 2011-07-29 DIAGNOSIS — K219 Gastro-esophageal reflux disease without esophagitis: Secondary | ICD-10-CM | POA: Diagnosis present

## 2011-07-29 DIAGNOSIS — J189 Pneumonia, unspecified organism: Secondary | ICD-10-CM

## 2011-07-29 DIAGNOSIS — J309 Allergic rhinitis, unspecified: Secondary | ICD-10-CM | POA: Diagnosis present

## 2011-07-29 DIAGNOSIS — I10 Essential (primary) hypertension: Secondary | ICD-10-CM | POA: Diagnosis present

## 2011-07-29 DIAGNOSIS — I421 Obstructive hypertrophic cardiomyopathy: Secondary | ICD-10-CM | POA: Diagnosis present

## 2011-07-29 DIAGNOSIS — IMO0002 Reserved for concepts with insufficient information to code with codable children: Secondary | ICD-10-CM

## 2011-07-29 DIAGNOSIS — I2510 Atherosclerotic heart disease of native coronary artery without angina pectoris: Secondary | ICD-10-CM | POA: Diagnosis present

## 2011-07-29 DIAGNOSIS — Z853 Personal history of malignant neoplasm of breast: Secondary | ICD-10-CM

## 2011-07-29 HISTORY — DX: Angiodysplasia of stomach and duodenum without bleeding: K31.819

## 2011-07-29 LAB — CBC
MCHC: 29 g/dL — ABNORMAL LOW (ref 30.0–36.0)
Platelets: 296 10*3/uL (ref 150–400)
RDW: 14.2 % (ref 11.5–15.5)
WBC: 9.8 10*3/uL (ref 4.0–10.5)

## 2011-07-29 LAB — BASIC METABOLIC PANEL
BUN: 14 mg/dL (ref 6–23)
Calcium: 9 mg/dL (ref 8.4–10.5)
Creatinine, Ser: 0.49 mg/dL — ABNORMAL LOW (ref 0.50–1.10)
GFR calc Af Amer: >90 mL/min (ref >90)
GFR calc non Af Amer: >90 mL/min (ref >90)

## 2011-07-29 MED ORDER — SIMVASTATIN 20 MG PO TABS: 20.0000 mg | ORAL_TABLET | Freq: Every day | ORAL | Status: DC

## 2011-07-29 MED ORDER — DILTIAZEM HCL ER COATED BEADS 120 MG PO CP24
120.0000 mg | ORAL_CAPSULE | Freq: Every day | ORAL | Status: DC
  Administered 2011-07-30 – 2011-08-01 (×3): 120 mg via ORAL
  Filled 2011-07-29 (×3): qty 1

## 2011-07-29 MED ORDER — AZITHROMYCIN 250 MG PO TABS
250.0000 mg | ORAL_TABLET | Freq: Every day | ORAL | Status: DC
  Administered 2011-07-30 – 2011-08-01 (×3): 250 mg via ORAL
  Filled 2011-07-29 (×3): qty 1

## 2011-07-29 MED ORDER — DARIFENACIN HYDROBROMIDE ER 7.5 MG PO TB24
7.5000 mg | ORAL_TABLET | Freq: Every day | ORAL | Status: DC
  Administered 2011-07-30 – 2011-08-01 (×3): 7.5 mg via ORAL
  Filled 2011-07-29 (×3): qty 1

## 2011-07-29 MED ORDER — CALCIUM CARBONATE 1250 (500 CA) MG PO TABS
1.0000 | ORAL_TABLET | Freq: Every day | ORAL | Status: DC
  Administered 2011-07-30 – 2011-08-01 (×3): 500 mg via ORAL
  Filled 2011-07-29 (×3): qty 1

## 2011-07-29 MED ORDER — BISOPROLOL-HYDROCHLOROTHIAZIDE 5-6.25 MG PO TABS
1.0000 | ORAL_TABLET | Freq: Every day | ORAL | Status: DC
  Administered 2011-07-30 – 2011-08-01 (×3): 1 via ORAL
  Filled 2011-07-29 (×3): qty 1

## 2011-07-29 MED ORDER — HEPARIN SODIUM (PORCINE) 5000 UNIT/ML IJ SOLN
5000.0000 [IU] | Freq: Three times a day (TID) | INTRAMUSCULAR | Status: DC
Start: 2011-07-29 — End: 2011-08-01
  Administered 2011-07-29 – 2011-08-01 (×10): 5000 [IU] via SUBCUTANEOUS
  Filled 2011-07-29 (×12): qty 1

## 2011-07-29 MED ORDER — ALBUTEROL SULFATE (5 MG/ML) 0.5% IN NEBU: 2.5000 mg | INHALATION_SOLUTION | RESPIRATORY_TRACT | Status: DC | PRN

## 2011-07-29 MED ORDER — ZOLPIDEM TARTRATE 5 MG PO TABS
5.0000 mg | ORAL_TABLET | Freq: Every evening | ORAL | Status: DC | PRN
  Administered 2011-07-29 – 2011-07-31 (×3): 5 mg via ORAL
  Filled 2011-07-29 (×3): qty 1

## 2011-07-29 MED ORDER — FUROSEMIDE 20 MG PO TABS
20.0000 mg | ORAL_TABLET | Freq: Every day | ORAL | Status: DC
  Administered 2011-07-30 – 2011-08-01 (×3): 20 mg via ORAL
  Filled 2011-07-29 (×3): qty 1

## 2011-07-29 MED ORDER — ROSUVASTATIN CALCIUM 5 MG PO TABS
5.0000 mg | ORAL_TABLET | Freq: Every day | ORAL | Status: DC
  Administered 2011-07-29 – 2011-07-31 (×3): 5 mg via ORAL
  Filled 2011-07-29 (×5): qty 1

## 2011-07-29 MED ORDER — SODIUM CHLORIDE 0.9 % IJ SOLN
3.0000 mL | Freq: Two times a day (BID) | INTRAMUSCULAR | Status: DC
  Administered 2011-07-30 – 2011-08-01 (×4): 3 mL via INTRAVENOUS

## 2011-07-29 MED ORDER — AZITHROMYCIN 500 MG PO TABS
500.0000 mg | ORAL_TABLET | Freq: Every day | ORAL | Status: AC
  Administered 2011-07-29: 500 mg via ORAL
  Filled 2011-07-29: qty 1

## 2011-07-29 MED ORDER — SODIUM CHLORIDE 0.9 % IV SOLN
250.0000 mL | INTRAVENOUS | Status: DC
  Administered 2011-07-29: 250 mL via INTRAVENOUS

## 2011-07-29 MED ORDER — OCUVITE-LUTEIN PO CAPS
1.0000 | ORAL_CAPSULE | Freq: Every day | ORAL | Status: DC
  Administered 2011-07-31 – 2011-08-01 (×2): 1 via ORAL
  Filled 2011-07-29 (×3): qty 1

## 2011-07-29 MED ORDER — TIOTROPIUM BROMIDE MONOHYDRATE 18 MCG IN CAPS
18.0000 ug | ORAL_CAPSULE | Freq: Every day | RESPIRATORY_TRACT | Status: DC
  Administered 2011-07-30 – 2011-08-01 (×3): 18 ug via RESPIRATORY_TRACT
  Filled 2011-07-29: qty 5

## 2011-07-29 MED ORDER — LEVOTHYROXINE SODIUM 50 MCG PO TABS
50.0000 ug | ORAL_TABLET | Freq: Every day | ORAL | Status: DC
  Filled 2011-07-29: qty 1

## 2011-07-29 MED ORDER — LEVOTHYROXINE SODIUM 50 MCG PO TABS
50.0000 ug | ORAL_TABLET | Freq: Every day | ORAL | Status: DC
  Administered 2011-07-30 – 2011-08-01 (×3): 50 ug via ORAL
  Filled 2011-07-29 (×3): qty 1

## 2011-07-29 MED ORDER — DILTIAZEM HCL ER COATED BEADS 120 MG PO CP24: 120.0000 mg | ORAL_CAPSULE | Freq: Every day | ORAL | Status: DC

## 2011-07-29 MED ORDER — ASPIRIN EC 81 MG PO TBEC
81.0000 mg | DELAYED_RELEASE_TABLET | Freq: Every day | ORAL | Status: DC
  Administered 2011-07-30 – 2011-08-01 (×3): 81 mg via ORAL
  Filled 2011-07-29 (×3): qty 1

## 2011-07-29 MED ORDER — LORATADINE 10 MG PO TABS
10.0000 mg | ORAL_TABLET | Freq: Every day | ORAL | Status: DC
  Administered 2011-07-30 – 2011-08-01 (×3): 10 mg via ORAL
  Filled 2011-07-29 (×3): qty 1

## 2011-07-29 MED ORDER — LEVOTHYROXINE SODIUM 112 MCG PO TABS
112.0000 ug | ORAL_TABLET | Freq: Every day | ORAL | Status: DC
  Administered 2011-07-30 – 2011-08-01 (×3): 112 ug via ORAL
  Filled 2011-07-29 (×4): qty 1

## 2011-07-29 MED ORDER — SODIUM CHLORIDE 0.9 % IJ SOLN: 3.0000 mL | INTRAMUSCULAR | Status: DC | PRN

## 2011-07-29 MED ORDER — BUDESONIDE-FORMOTEROL FUMARATE 160-4.5 MCG/ACT IN AERO
2.0000 | INHALATION_SPRAY | Freq: Two times a day (BID) | RESPIRATORY_TRACT | Status: DC
  Administered 2011-07-29 – 2011-08-01 (×6): 2 via RESPIRATORY_TRACT
  Filled 2011-07-29: qty 6

## 2011-07-29 MED ORDER — BISOPROLOL-HYDROCHLOROTHIAZIDE 5-6.25 MG PO TABS: 1.0000 | ORAL_TABLET | Freq: Every day | ORAL | Status: DC

## 2011-07-29 MED ORDER — PANTOPRAZOLE SODIUM 40 MG PO TBEC
40.0000 mg | DELAYED_RELEASE_TABLET | Freq: Two times a day (BID) | ORAL | Status: DC
  Administered 2011-07-29 – 2011-08-01 (×6): 40 mg via ORAL
  Filled 2011-07-29 (×7): qty 1

## 2011-07-29 MED ORDER — METHYLPREDNISOLONE SODIUM SUCC 125 MG IJ SOLR
60.0000 mg | Freq: Four times a day (QID) | INTRAMUSCULAR | Status: DC
  Administered 2011-07-29: via INTRAVENOUS
  Administered 2011-07-29: 60 mg via INTRAVENOUS
  Administered 2011-07-30: 125 mg via INTRAVENOUS
  Filled 2011-07-29 (×7): qty 0.96

## 2011-07-29 NOTE — Patient Instructions (Signed)
Admit to [facility] for AE-COPD

## 2011-07-29 NOTE — Assessment & Plan Note (Signed)
Acute exacerbation, likely due to URI vs allergy season.  - admit for rx AE-COPD

## 2011-07-29 NOTE — Progress Notes (Signed)
Nursing- Dr Delton Coombes called about tele order. No order for tele noted- He does not want pt on tele.-To clarify

## 2011-07-29 NOTE — Progress Notes (Signed)
Subjective:    Patient ID: [name], female    DOB: Aug 06, 1933, 77 y.o.   MRN: [id number]  HPI 57 former smoker, dx with COPD about 1999, also hx breast CA, CVA with R frontal AVM, colonic AVM's. Also diastolic dysfxn followed Dr Gala Romney. Has been treated with Spiriva + Advair, since 2003.   September 21, 2010--Presents for an acute office visit. Complains of wheezing, increased SOB, dry cough x2-3days. Complains of last 4-5 months has been on abx and steroids each month. As soon as she finishes her course symptoms begin to come back with cough, wheezing and dyspnea. Last 2 days, dry cough w/ wheezing. No discolored mucus. Denies chest pain, orthopnea, hemoptysis, fever, n/v/d, edema, headache.   10/30/10 -- follow up severe COPD. Flare last visit, treated with pred and abx, changed Advair to Symbicort to see if this helped with UA irritation and cough. We left her on Pred 10mg  by mouth once daily with plans to f/u and decide whether to stay on chronically.   02/14/11 Acute OV  Pt presents for a work in visit. Complains of increased SOB w/ exertion and sats into 70s w/ exertion, HR varying from the upper 40s to the 120s. Pt complains that over last couple of weeks she feels tired , low energy and more short of breath with walking. She has marathon helios that she has noticed she desats with walking into the 70s. She uses the demand setting. On the continuous flow she does not desat. Today in the office no desaturation with walking on 3 l/m continuous flow. HR ranges 100-120 with walking and at rest. On her portable oximeter her HR avg 40-120 , in the office HR maintained ~106-120 with walking . EKG w/ no acute changes w/ HR ~90 at rest. Does feel tight in chest with walking. Has some occasional wheezing. No discolored mucus or fever. Ankles have been puffy for last several days.  Recently changed from her bisoprolol to cardizem by cardiology.  ROV 03/05/11 -- severe COPD w hypoxemia. As above, recently  seen for DOE and labile HR. Since last time was admitted for anemia, Hgb 6.0. In after math feels her breathing and fatigue are improved. Returns today for f/u. She had to have AVM's cauterized. Has been on chronic pred for last 6 months, currently on 5mg  daily. Her goal is to taper to zero. Currently on Symbicort, Spiriva, uses SABA prn not every day.   ROV 04/03/11 -- severe COPD. Last time we decreased Pred to 3mg  qd. Since our visit she was readmitted for anemia, she is being followed by GI (Dr Mechele Collin). She has had endoscopy, CSY, capsule endoscopy. She feels washed out from the anemia. No real breathing change, wheezing, coughing.   ROV 06/04/11 - written note. Decreased Pred to 1mg  qd.  ROV 07/29/11 -- severe COPD with hypoxemia, tapering steroids. Last time we decreased to 1mg  daily.  She presents with worsening dyspnea, wheeze. No real cough. Her SABA use has increased over the last week. She is less mobile in the home, needed wheelchair.     Objective:   Physical Exam GEN: A/Ox3; pleasant , NAD, elderly female   HEENT:  [place]/AT,  EACs-clear, TMs-wnl, NOSE-clear, THROAT-clear, no lesions, no postnasal drip or exudate noted.   NECK:  Supple w/ fair ROM; no JVD; normal carotid impulses w/o bruits; no thyromegaly or nodules palpated; no lymphadenopathy.  RESP  No wheezeno accessory muscle use, no dullness to percussion  CARD:  RRR, Gr 1/6 SM  Tr-1 + peripheral edema, pulses intact, no cyanosis or clubbing.  Musco: Warm bil, no deformities or joint swelling noted.   Neuro: alert, no focal deficits noted.    Skin: Warm, no lesions or rashes   Assessment & Plan:  COPD Acute exacerbation, likely due to URI vs allergy season.  - admit for rx AE-COPD

## 2011-07-29 NOTE — H&P (Signed)
HPI  56 former smoker, dx with COPD about 1999, also hx breast CA, CVA with R frontal AVM, colonic AVM's. Also diastolic dysfxn followed Dr Gala Romney. Has been treated with Spiriva + Advair, since 2003. On chronic pred, tapering currently at 1mg  daily  ROV 07/29/11 -- severe COPD with hypoxemia, tapering steroids. Last time we decreased to 1mg  daily. She presents with worsening dyspnea, wheeze. No real cough. Her SABA use has increased over the last week. She is less mobile in the home, needed wheelchair. Much more SOB, daughter worried about whether she can manage this illness at home.   Past Medical History  Diagnosis Date  . COPD (chronic obstructive pulmonary disease)   . Hypertrophic obstructive cardiomyopathy     Echo 10/10 EF 65-70% SW 1.6 PW 1.4 mild SAM no LVOT gradient at rest. Valsalva not performed Mild MR. No hyperenhance ment on MRI EF 69%.  . Coronary atherosclerosis of native coronary artery     Cath 1/11: RCA 50-60 ostial o/w normal. EF 70%.   . Pulmonary hypertension     Cath 1/11 RA 8, RV 43/6/14, PA 42/17 (29) PCWP 15 Fick  5.0/2.6. PVR 2.8    . Edema   . Breast cancer   . Occlusion and stenosis of carotid artery     s/p L CEA u/s 3/11. R 60-79%; L 40-59%. no change since 3/10  . Peripheral vascular disease, unspecified   . Hypertension   . Dyspnea   . Emphysema   . Asthma   . Hypothyroidism   . Stroke   . Allergic rhinitis      Family History  Problem Relation Age of Onset  . Emphysema Father   . COPD Sister   . Hypertension Sister     Pulmonary  . Emphysema Sister   . Asthma Sister   . Asthma Sister   . Emphysema Brother     x2  . Colon cancer Brother   . Colon cancer Mother   . Stroke Mother      History   Social History  . Marital Status: Single    Spouse Name: N/A    Number of Children: N/A  . Years of Education: N/A   Occupational History  . 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  Smoking status: Former Smoker -- 1.0 packs/day for 30 years    Types: Cigarettes    Quit date: 09/16/1997  . Smokeless tobacco: Not on file  . Alcohol Use: No  . Drug Use: No  . Sexually Active: Not on file   Other Topics Concern  . Not on file   Social History Narrative   Single, no childrenLives with 2 sistersDoes not get regular exercise     Allergies  Allergen Reactions  . Latex     Objective:   Physical Exam  GEN: A/Ox3; ill appearing in whelchair HEENT: [place]/AT, EACs-clear, TMs-wnl, NOSE-clear, THROAT-clear, no lesions, nasal congestion, PND, scratchy throat NECK: Supple w/ fair ROM; no JVD; normal carotid impulses w/o bruits; no thyromegaly or nodules palpated; no lymphadenopathy.  RESP Significant B exp wheezes all fields CARD: RRR, Gr 1/6 SM Tr-1 + peripheral edema, pulses intact, no cyanosis or clubbing.  Musco: Warm bil, no deformities or joint swelling noted.  Neuro: alert, no focal deficits noted.  Skin: Warm, no lesions or rash  Active Problems:  COPD - admit to [facility] for AE-COPD - start IV steroids - azithromycin -  CXR and  Labs once admitted   HYPOTHYROIDISM - home synthroid dosing   HYPERTENSION - continue her home BP regimen   CAD, NATIVE VESSEL - ASA daily - statin daily   ALLERGIC RHINITIS - convert allegra to loratadine for formulary purposes   GERD (gastroesophageal reflux disease) - PPI as ordered  BYRUM,ROBERT S. 07/29/2011 12:11 PM

## 2011-07-29 NOTE — H&P (Signed)
HPI 68 former smoker, dx with COPD about 1999, also hx breast CA, CVA with R frontal AVM, colonic AVM's. Also diastolic dysfxn followed Dr Gala Romney. Has been treated with Spiriva + Advair, since 2003. On chronic prednisone 1mg  daily (has been undergoing long taper).   Hospital admission HPI 07/29/11 -- Hx of severe COPD with hypoxemia, tapering steroids. Last time we decreased to 1mg  daily.  She presents with worsening dyspnea, wheeze. No real cough. Her SABA use has increased over the last week. She is less mobile in the home, needed wheelchair.   Past Medical History  Diagnosis Date  . COPD (chronic obstructive pulmonary disease)   . Hypertrophic obstructive cardiomyopathy     Echo 10/10 EF 65-70% SW 1.6 PW 1.4 mild SAM no LVOT gradient at rest. Valsalva not performed Mild MR. No hyperenhance ment on MRI EF 69%.  . Coronary atherosclerosis of native coronary artery     Cath 1/11: RCA 50-60 ostial o/w normal. EF 70%.   . Pulmonary hypertension     Cath 1/11 RA 8, RV 43/6/14, PA 42/17 (29) PCWP 15 Fick  5.0/2.6. PVR 2.8    . Edema   . Breast cancer   . Occlusion and stenosis of carotid artery     s/p L CEA u/s 3/11. R 60-79%; L 40-59%. no change since 3/10  . Peripheral vascular disease, unspecified   . Hypertension   . Dyspnea   . Emphysema   . Asthma   . Hypothyroidism   . Stroke   . Allergic rhinitis      Family History  Problem Relation Age of Onset  . Emphysema Father   . COPD Sister   . Hypertension Sister     Pulmonary  . Emphysema Sister   . Asthma Sister   . Asthma Sister   . Emphysema Brother     x2  . Colon cancer Brother   . Colon cancer Mother   . Stroke Mother      History   Social History  . Marital Status: Single    Spouse Name: N/A    Number of Children: N/A  . Years of Education: N/A   Occupational History  . 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 Smoking status: Former Smoker -- 1.0  packs/day for 30 years    Types: Cigarettes    Quit date: 09/16/1997  . Smokeless tobacco: Not on file  . Alcohol Use: No  . Drug Use: No  . Sexually Active: Not on file   Other Topics Concern  . Not on file   Social History Narrative   Single, no childrenLives with 2 sistersDoes not get regular exercise     Allergies  Allergen Reactions  . Latex      Outpatient Prescriptions Prior to Visit  Medication Sig Dispense Refill  . albuterol (PROVENTIL HFA;VENTOLIN HFA) 108 (90 BASE) MCG/ACT inhaler Inhale 2 puffs into the lungs every 6 (six) hours as needed.  1 Inhaler  3  . aspirin 81 MG EC tablet Take 81 mg by mouth daily.        Marland Kitchen atorvastatin (LIPITOR) 20 MG tablet Take 20 mg by mouth daily.        . bisoprolol-hydrochlorothiazide (ZIAC) 5-6.25 MG per tablet Take 1 tablet by mouth daily.        . Calcium Carbonate (CALCIUM 600) 1500 MG TABS Take 1 tablet by mouth daily.        Marland Kitchen  darifenacin (ENABLEX) 7.5 MG 24 hr tablet Take 7.5 mg by mouth daily.        Marland Kitchen diltiazem (CARDIZEM CD) 120 MG 24 hr capsule Take 1 capsule (120 mg total) by mouth daily.  30 capsule  6  . furosemide (LASIX) 20 MG tablet Take 20 mg by mouth daily.        Marland Kitchen levothyroxine (SYNTHROID, LEVOTHROID) 112 MCG tablet Take 112 mcg by mouth daily.        Marland Kitchen levothyroxine (SYNTHROID, LEVOTHROID) 50 MCG tablet Take 50 mcg by mouth daily.        . Multiple Vitamins-Minerals (ONE-A-DAY EXTRAS ANTIOXIDANT) CAPS Take 1 capsule by mouth daily.        Marland Kitchen tiotropium (SPIRIVA) 18 MCG inhalation capsule Place 1 capsule (18 mcg total) into inhaler and inhale daily.  30 capsule  3  . predniSONE (DELTASONE) 1 MG tablet Take 3 tablets daily  100 tablet  3  . budesonide-formoterol (SYMBICORT) 160-4.5 MCG/ACT inhaler Inhale 2 puffs into the lungs 2 (two) times daily.  1 Inhaler  3  . fexofenadine (ALLEGRA) 180 MG tablet Take 180 mg by mouth daily.        . pantoprazole (PROTONIX) 40 MG tablet Take 1 tablet (40 mg total) by mouth 2 (two)  times daily.  60 tablet  5  . diltiazem (CARDIZEM SR) 120 MG 12 hr capsule Take 1 tablet by mouth daily.         Objective:   Physical Exam GEN: A/Ox3; pleasant , NAD, elderly female   HEENT:  /AT,  EACs-clear, TMs-wnl, NOSE-clear, THROAT-clear, no lesions, no postnasal drip or exudate noted.   NECK:  Supple w/ fair ROM; no JVD; normal carotid impulses w/o bruits; no thyromegaly or nodules palpated; no lymphadenopathy.  RESP  No wheezeno accessory muscle use, no dullness to percussion  CARD:  RRR, Gr 1/6 SM  Tr-1 + peripheral edema, pulses intact, no cyanosis or clubbing.  Musco: Warm bil, no deformities or joint swelling noted.   Neuro: alert, no focal deficits noted.    Skin: Warm, no lesions or rashes

## 2011-07-30 ENCOUNTER — Ambulatory Visit: Payer: Medicare Other | Admitting: Emergency Medicine

## 2011-07-30 LAB — CBC
Hemoglobin: 9.7 g/dL — ABNORMAL LOW (ref 12.0–15.0)
MCH: 25.3 pg — ABNORMAL LOW (ref 26.0–34.0)
MCHC: 29.8 g/dL — ABNORMAL LOW (ref 30.0–36.0)
Platelets: 296 10*3/uL (ref 150–400)

## 2011-07-30 LAB — BASIC METABOLIC PANEL
BUN: 16 mg/dL (ref 6–23)
Calcium: 9.1 mg/dL (ref 8.4–10.5)
Creatinine, Ser: 0.43 mg/dL — ABNORMAL LOW (ref 0.50–1.10)
GFR calc non Af Amer: >90 mL/min (ref >90)
Glucose, Bld: 174 mg/dL — ABNORMAL HIGH (ref 70–99)

## 2011-07-30 MED ORDER — METHYLPREDNISOLONE SODIUM SUCC 125 MG IJ SOLR
60.0000 mg | Freq: Two times a day (BID) | INTRAMUSCULAR | Status: DC
  Filled 2011-07-30 (×2): qty 0.96

## 2011-07-30 MED ORDER — HYDROCOD POLST-CHLORPHEN POLST 10-8 MG/5ML PO LQCR
5.0000 mL | Freq: Two times a day (BID) | ORAL | Status: DC | PRN
  Administered 2011-07-30 – 2011-07-31 (×2): 5 mL via ORAL
  Filled 2011-07-30 (×2): qty 5

## 2011-07-30 NOTE — Progress Notes (Signed)
eLink Physician-Brief Progress Note Patient Name: [name] DOB: Feb 09, 1933 MRN: [id number]  Date of Service  07/30/2011   HPI/Events of Note   Called for cough  eICU Interventions  tussionex ordered prn    Intervention Category Minor Interventions: Routine modifications to care plan (e.g. PRN medications for pain, fever)  Shan Levans 07/30/2011, 5:16 PM

## 2011-07-30 NOTE — Progress Notes (Addendum)
  HPI  89 former smoker, dx with COPD about 1999, also hx breast CA, CVA with R frontal AVM, colonic AVM's. Also diastolic dysfxn followed Dr Gala Romney. Has been treated with Spiriva + Advair, since 2003. On chronic pred, tapering currently at 1mg  daily Micro: Abx:11/12 zithro>>  Objective:   Physical Exam  GEN: A/Ox3; NAD at rest. Feels better HEENT: [place]/AT, EACs-clear, TMs-wnl, NOSE-clear, THROAT-clear, no lesions, nasal congestion, PND, scratchy throat NECK: Supple w/ fair ROM; no JVD; normal carotid impulses w/o bruits; no thyromegaly or nodules palpated; no lymphadenopathy.  RESP decreased bs in bases. Mild exp wheeze CARD: RRR, Gr 1/6 SM Tr-1 + peripheral edema, pulses intact, no cyanosis or clubbing.  Musco: Warm bil, no deformities or joint swelling noted.  Neuro: alert, no focal deficits noted.  Skin: Warm, no lesions or rash  Dg Chest 2 View  07/29/2011  *RADIOLOGY REPORT*  Clinical Data: COPD and short of breath  CHEST - 2 VIEW  Comparison: 02/14/2011  Findings: COPD.  Bullous emphysema right upper lobe.  Crowding of the lung markings in the bases compatible with atelectasis and scarring.  Negative for pneumonia or heart failure.  No mass or pleural effusion.  Chronic fracture in the mid thoracic spine, unchanged.  Per CMS PQRS reporting requirements (PQRS Measure 24): Given the patient's age of greater than 50 and the fracture site (hip, distal radius, or spine), the patient should be tested for osteoporosis using DXA, and the appropriate treatment considered based on the DXA results.  IMPRESSION: COPD and scarring.  No acute cardiopulmonary disease.  Original Report Authenticated By: Camelia Phenes, M.D.    Active Problems:  COPD - admit to [facility] for AE-COPD - On  IV steroids - azithromycin - CXR and  Labs once admitted   HYPOTHYROIDISM - home synthroid dosing   HYPERTENSION - continue her home BP regimen   CAD, NATIVE VESSEL - ASA daily - statin daily   ALLERGIC  RHINITIS - convert allegra to loratadine for formulary purposes   GERD (gastroesophageal reflux disease) - PPI as ordered  MINOR,WILLIAM S 07/30/2011 12:08 PM   Attending Addendum:  I have seen the patient, discussed the issues, test results and plans with S. Minor. I agree with the Assessment and Plans as outlined above.  Adja Ruff S. 07/30/2011 5:08 PM

## 2011-07-31 LAB — BASIC METABOLIC PANEL
Calcium: 8.5 mg/dL (ref 8.4–10.5)
GFR calc non Af Amer: 88 mL/min — ABNORMAL LOW (ref >90)
Glucose, Bld: 132 mg/dL — ABNORMAL HIGH (ref 70–99)
Sodium: 139 mEq/L (ref 135–145)

## 2011-07-31 MED ORDER — PREDNISONE 20 MG PO TABS
40.0000 mg | ORAL_TABLET | Freq: Every day | ORAL | Status: DC
  Administered 2011-07-31 – 2011-08-01 (×2): 40 mg via ORAL
  Filled 2011-07-31 (×2): qty 2

## 2011-07-31 MED ORDER — METHYLPREDNISOLONE SODIUM SUCC 40 MG IJ SOLR
40.0000 mg | Freq: Two times a day (BID) | INTRAMUSCULAR | Status: DC
  Filled 2011-07-31 (×2): qty 1

## 2011-07-31 NOTE — Progress Notes (Signed)
  HPI  26 former smoker, dx with COPD about 1999, also hx breast CA, CVA with R frontal AVM, colonic AVM's. Also diastolic dysfxn followed Dr Gala Romney. Has been treated with Spiriva + Advair, since 2003. On chronic pred, tapering currently at 1mg  daily Micro: Abx:11/12 zithro>>  Objective:   Physical Exam  GEN: A/Ox3; NAD at rest. Feels better HEENT: [place]/AT, EACs-clear, TMs-wnl, NOSE-clear, THROAT-clear, no lesions, nasal congestion, PND, scratchy throat NECK: Supple w/ fair ROM; no JVD; normal carotid impulses w/o bruits; no thyromegaly or nodules palpated; no lymphadenopathy.  RESP decreased bs in bases. Mild exp wheeze CARD: RRR, Gr 1/6 SM Tr-1 + peripheral edema, pulses intact, no cyanosis or clubbing.  Musco: Warm bil, no deformities or joint swelling noted.  Neuro: alert, no focal deficits noted.  Skin: Warm, no lesions or rash  Dg Chest 2 View  07/29/2011  *RADIOLOGY REPORT*  Clinical Data: COPD and short of breath  CHEST - 2 VIEW  Comparison: 02/14/2011  Findings: COPD.  Bullous emphysema right upper lobe.  Crowding of the lung markings in the bases compatible with atelectasis and scarring.  Negative for pneumonia or heart failure.  No mass or pleural effusion.  Chronic fracture in the mid thoracic spine, unchanged.  Per CMS PQRS reporting requirements (PQRS Measure 24): Given the patient's age of greater than 50 and the fracture site (hip, distal radius, or spine), the patient should be tested for osteoporosis using DXA, and the appropriate treatment considered based on the DXA results.  IMPRESSION: COPD and scarring.  No acute cardiopulmonary disease.  Original Report Authenticated By: Camelia Phenes, M.D.    Active Problems:  COPD - admit to [facility] for AE-COPD - On  IV steroids -> change to pred 11/14 - azithromycin - CXR and  Labs  HYPOTHYROIDISM - home synthroid dosing   HYPERTENSION - continue her home BP regimen   CAD, NATIVE VESSEL - ASA daily - statin daily   ALLERGIC RHINITIS - convert allegra to loratadine for formulary purposes   GERD (gastroesophageal reflux disease) - PPI as ordered  MINOR,WILLIAM S 07/31/2011 12:47 PM   Attending Addendum:  I have seen the patient, discussed the issues, test results and plans with S. Minor. I agree with the Assessment and Plans as outlined above.  BYRUM,ROBERT S. 07/31/2011 5:30 PM

## 2011-08-01 MED ORDER — PREDNISONE 10 MG PO TABS: 40.0000 mg | ORAL_TABLET | Freq: Every day | ORAL | Status: AC

## 2011-08-01 NOTE — Progress Notes (Signed)
Pt discharge instructions, medications and follow up appts discussed with pt.  No issues or concerns stated.  Pt discharged home at this time.

## 2011-08-01 NOTE — Discharge Summary (Signed)
Physician Discharge Summary  Patient ID: [name] MRN: [id number] DOB/AGE: 1933/09/16 77 y.o.  Admit date: 07/29/2011 Discharge date: 08/01/2011  Admission Diagnoses:  Discharge Diagnoses:  Active Problems:  COPD. Admitted with AECOPD and tx with steroids , abx and o2 therapy. Discharged 08/01/2011 after maximal hospital benefit.   HYPOTHYROIDISM: No change.  HYPERTENSION: No change  CAD, NATIVE VESSEL: No change.  ALLERGIC RHINITIS: No Change.  GERD (gastroesophageal reflux disease): No change.   Lines, Airways, Drains:    Microbiology/Sepsis markers: No results found for this or any previous visit.  Anti-infectives:  Anti-infectives     Start     Dose/Rate Route Frequency Ordered Stop   07/30/11 1000   azithromycin (ZITHROMAX) tablet 250 mg        250 mg Oral Daily 07/29/11 1536 08/03/11 0959   07/29/11 1700   azithromycin (ZITHROMAX) tablet 500 mg        500 mg Oral Daily 07/29/11 1536 07/29/11 1716            Studies: Dg Chest 2 View  07/29/2011  *RADIOLOGY REPORT*  Clinical Data: COPD and short of breath  CHEST - 2 VIEW  Comparison: 02/14/2011  Findings: COPD.  Bullous emphysema right upper lobe.  Crowding of the lung markings in the bases compatible with atelectasis and scarring.  Negative for pneumonia or heart failure.  No mass or pleural effusion.  Chronic fracture in the mid thoracic spine, unchanged.  Per CMS PQRS reporting requirements (PQRS Measure 24): Given the patient's age of greater than 50 and the fracture site (hip, distal radius, or spine), the patient should be tested for osteoporosis using DXA, and the appropriate treatment considered based on the DXA results.  IMPRESSION: COPD and scarring.  No acute cardiopulmonary disease.  Original Report Authenticated By: Camelia Phenes, M.D.    Consults:     Hospital Course:   COPD. Admitted with AECOPD and tx with steroids , abx and o2 therapy. Discharged 08/01/2011 after maximal hospital benefit.     HYPOTHYROIDISM: No change.  HYPERTENSION: No change  CAD, NATIVE VESSEL: No change.  ALLERGIC RHINITIS: No Change.  GERD (gastroesophageal reflux disease): No change.  Discharge Exam: No data found. ]    Labs at discharge Lab Results  Component Value Date   CREATININE 0.56 07/31/2011   BUN 22 07/31/2011   NA 139 07/31/2011   K 3.3* 07/31/2011   CL 98 07/31/2011   CO2 36* 07/31/2011   Lab Results  Component Value Date   WBC 7.2 07/30/2011   HGB 9.7* 07/30/2011   HCT 32.6* 07/30/2011   MCV 84.9 07/30/2011   PLT 296 07/30/2011   No results found for this basename: ALT, AST, GGT, ALKPHOS, BILITOT   Lab Results  Component Value Date   INR 0.93 09/13/2009    Current radiology studies @[id number] @  Disposition:    Discharge Orders    Future Appointments: Provider: Department: Dept Phone: Center:   08/12/2011 4:15 PM Rubye Oaks, NP [facility] [phone] None     Current Discharge Medication List    CONTINUE these medications which have CHANGED   Details  predniSONE (DELTASONE) 10 MG tablet Take 4 tablets (40 mg total) by mouth daily. 4 tabs x 3 days  3 tab x 3 days 2 tabs x 3 days 1 tab a day and stay till you see Dr. Faythe Dingwall: 50 tablet, Refills: 0      CONTINUE these medications which have NOT CHANGED   Details  albuterol (PROVENTIL HFA;VENTOLIN HFA) 108 (90 BASE) MCG/ACT inhaler Inhale 2 puffs into the lungs every 6 (six) hours as needed. Qty: 1 Inhaler, Refills: 3    aspirin 81 MG EC tablet Take 81 mg by mouth daily.      atorvastatin (LIPITOR) 20 MG tablet Take 20 mg by mouth daily.      bisoprolol-hydrochlorothiazide (ZIAC) 5-6.25 MG per tablet Take 1 tablet by mouth daily.      budesonide-formoterol (SYMBICORT) 160-4.5 MCG/ACT inhaler Inhale 2 puffs into the lungs 2 (two) times daily. Qty: 1 Inhaler, Refills: 3    Calcium Carbonate (CALCIUM 600) 1500 MG TABS Take 1 tablet by mouth daily.      cetirizine (ZYRTEC ALLERGY) 10 MG  tablet Take 10 mg by mouth daily.      darifenacin (ENABLEX) 7.5 MG 24 hr tablet Take 7.5 mg by mouth daily.      fexofenadine (ALLEGRA) 180 MG tablet Take 180 mg by mouth daily.      furosemide (LASIX) 20 MG tablet Take 20 mg by mouth daily.      !! levothyroxine (SYNTHROID, LEVOTHROID) 112 MCG tablet Take 112 mcg by mouth daily.     !! levothyroxine (SYNTHROID, LEVOTHROID) 50 MCG tablet Take 50 mcg by mouth daily.      Multiple Vitamins-Minerals (ONE-A-DAY EXTRAS ANTIOXIDANT) CAPS Take 1 capsule by mouth daily.      pantoprazole (PROTONIX) 40 MG tablet Take 1 tablet (40 mg total) by mouth 2 (two) times daily. Qty: 60 tablet, Refills: 5   Associated Diagnoses: GERD (gastroesophageal reflux disease)    tiotropium (SPIRIVA) 18 MCG inhalation capsule Place 1 capsule (18 mcg total) into inhaler and inhale daily. Qty: 30 capsule, Refills: 3    diltiazem (CARDIZEM CD) 120 MG 24 hr capsule Take 1 capsule (120 mg total) by mouth daily. Qty: 30 capsule, Refills: 6     !! - Potential duplicate medications found. Please discuss with provider.     Follow-up Information    Follow up with PARRETT,TAMMY, NP on 08/12/2011. (4:15 pm )    Contact information:   [facility] [address] [place] [address] [phone]          Discharged Condition: good  Signed: Candie Chroman 08/01/2011, 2:29 PM    Attending Addendum:  I have seen the patient, discussed the issues, test results and plans with S. Minor. I agree with the Assessment and Plans as outlined above.  [name] S. 08/01/2011 3:12 PM

## 2011-08-12 ENCOUNTER — Encounter: Payer: Self-pay | Admitting: Adult Health

## 2011-08-12 ENCOUNTER — Ambulatory Visit (INDEPENDENT_AMBULATORY_CARE_PROVIDER_SITE_OTHER): Payer: Medicare Other | Admitting: Adult Health

## 2011-08-12 VITALS — BP 132/50 | HR 80 | Temp 97.4°F | Ht 66.5 in | Wt 166.6 lb

## 2011-08-12 DIAGNOSIS — K219 Gastro-esophageal reflux disease without esophagitis: Secondary | ICD-10-CM

## 2011-08-12 DIAGNOSIS — J449 Chronic obstructive pulmonary disease, unspecified: Secondary | ICD-10-CM

## 2011-08-12 MED ORDER — BUDESONIDE-FORMOTEROL FUMARATE 160-4.5 MCG/ACT IN AERO: 2.0000 | INHALATION_SPRAY | Freq: Two times a day (BID) | RESPIRATORY_TRACT | Status: DC

## 2011-08-12 MED ORDER — ALBUTEROL SULFATE HFA 108 (90 BASE) MCG/ACT IN AERS: 2.0000 | INHALATION_SPRAY | Freq: Four times a day (QID) | RESPIRATORY_TRACT | Status: DC | PRN

## 2011-08-12 MED ORDER — PANTOPRAZOLE SODIUM 40 MG PO TBEC: 40.0000 mg | DELAYED_RELEASE_TABLET | Freq: Two times a day (BID) | ORAL | Status: DC

## 2011-08-12 MED ORDER — TIOTROPIUM BROMIDE MONOHYDRATE 18 MCG IN CAPS: 18.0000 ug | ORAL_CAPSULE | Freq: Every day | RESPIRATORY_TRACT | Status: DC

## 2011-08-12 NOTE — Assessment & Plan Note (Signed)
Resolving flare   Plan: taper prednisone to 5mg  and hold  Cont on symbicort and spiriva  follow up DR .Byrum in 3 weeks and As needed

## 2011-08-12 NOTE — Progress Notes (Signed)
Subjective:    Patient ID: [name], female    DOB: [birth year], 77 y.o.   MRN: [id number]  HPI 69 former smoker, dx with COPD about 1999, also hx breast CA, CVA with R frontal AVM, colonic AVM's. Also diastolic dysfxn followed Dr Gala Romney. Has been treated with Spiriva + Advair, since 2003.   September 21, 2010--Presents for an acute office visit. Complains of wheezing, increased SOB, dry cough x2-3days. Complains of last 4-5 months has been on abx and steroids each month. As soon as she finishes her course symptoms begin to come back with cough, wheezing and dyspnea. Last 2 days, dry cough w/ wheezing. No discolored mucus. Denies chest pain, orthopnea, hemoptysis, fever, n/v/d, edema, headache.   10/30/10 -- follow up severe COPD. Flare last visit, treated with pred and abx, changed Advair to Symbicort to see if this helped with UA irritation and cough. We left her on Pred 10mg  by mouth once daily with plans to f/u and decide whether to stay on chronically.   02/14/11 Acute OV  Pt presents for a work in visit. Complains of increased SOB w/ exertion and sats into 70s w/ exertion, HR varying from the upper 40s to the 120s. Pt complains that over last couple of weeks she feels tired , low energy and more short of breath with walking. She has marathon helios that she has noticed she desats with walking into the 70s. She uses the demand setting. On the continuous flow she does not desat. Today in the office no desaturation with walking on 3 l/m continuous flow. HR ranges 100-120 with walking and at rest. On her portable oximeter her HR avg 40-120 , in the office HR maintained ~106-120 with walking . EKG w/ no acute changes w/ HR ~90 at rest. Does feel tight in chest with walking. Has some occasional wheezing. No discolored mucus or fever. Ankles have been puffy for last several days.  Recently changed from her bisoprolol to cardizem by cardiology.  ROV 03/05/11 -- severe COPD w hypoxemia. As above, recently  seen for DOE and labile HR. Since last time was admitted for anemia, Hgb 6.0. In after math feels her breathing and fatigue are improved. Returns today for f/u. She had to have AVM's cauterized. Has been on chronic pred for last 6 months, currently on 5mg  daily. Her goal is to taper to zero. Currently on Symbicort, Spiriva, uses SABA prn not every day.   ROV 04/03/11 -- severe COPD. Last time we decreased Pred to 3mg  qd. Since our visit she was readmitted for anemia, she is being followed by GI (Dr Mechele Collin). She has had endoscopy, CSY, capsule endoscopy. She feels washed out from the anemia. No real breathing change, wheezing, coughing.   ROV 06/04/11 - written note. Decreased Pred to 1mg  qd.  ROV 07/29/11 -- severe COPD with hypoxemia, tapering steroids. Last time we decreased to 1mg  daily.  She presents with worsening dyspnea, wheeze. No real cough. Her SABA use has increased over the last week. She is less mobile in the home, needed wheelchair.  >>admitted   08/12/2011 Post Hospital  Admitted 11/12-11/15 for AECOPD , tx w/ IV abx , steroids and nebs. CXR without acute process.  Discharged on zpack and steroid taper. She is feeling better with decreased cough and dyspnea  Continued on Spiriva and Symbicort .  Currently on prednisone 10mg  daily  No chest pain , discolored mucus or fever    Constitutional:   No  weight loss, night sweats,  Fevers, chills,  ++fatigue, or  lassitude.  HEENT:   No headaches,  Difficulty swallowing,  Tooth/dental problems, or  Sore throat,                No sneezing, itching, ear ache, nasal congestion, post nasal drip,   CV:  No chest pain,  Orthopnea, PND, swelling in lower extremities, anasarca, dizziness, palpitations, syncope.   GI  No heartburn, indigestion, abdominal pain, nausea, vomiting, diarrhea, change in bowel habits, loss of appetite, bloody stools.   Resp:   No non-productive cough,  No coughing up of blood.  No change in color of mucus.  No  wheezing.  No chest wall deformity  Skin: no rash or lesions.  GU: no dysuria, change in color of urine, no urgency or frequency.  No flank pain, no hematuria   MS:  No joint pain or swelling.  No decreased range of motion.  No back pain.  Psych:  No change in mood or affect. No depression or anxiety.  No memory loss.        Objective:   Physical Exam GEN: A/Ox3; pleasant , NAD, elderly female   HEENT:  [place]/AT,  EACs-clear, TMs-wnl, NOSE-clear, THROAT-clear, no lesions, no postnasal drip or exudate noted.   NECK:  Supple w/ fair ROM; no JVD; normal carotid impulses w/o bruits; no thyromegaly or nodules palpated; no lymphadenopathy.  RESP  No wheezeno accessory muscle use, no dullness to percussion, coarse BS   CARD:  RRR, Gr 1/6 SM  Tr-1 + peripheral edema, pulses intact, no cyanosis or clubbing.  Musco: Warm bil, no deformities or joint swelling noted.   Neuro: alert, no focal deficits noted.    Skin: Warm, no lesions or rashes   Assessment & Plan:

## 2011-08-12 NOTE — Patient Instructions (Signed)
Decrease Prednisone 10mg  daily for 1 week then 5mg  daily and hold at this dose Follow up Dr. Delton Coombes in 3 weeks and As needed

## 2011-09-02 ENCOUNTER — Ambulatory Visit (INDEPENDENT_AMBULATORY_CARE_PROVIDER_SITE_OTHER): Payer: Medicare Other | Admitting: Emergency Medicine

## 2011-09-02 ENCOUNTER — Encounter: Payer: Self-pay | Admitting: Emergency Medicine

## 2011-09-02 DIAGNOSIS — J449 Chronic obstructive pulmonary disease, unspecified: Secondary | ICD-10-CM

## 2011-09-02 DIAGNOSIS — R609 Edema, unspecified: Secondary | ICD-10-CM

## 2011-09-02 MED ORDER — PREDNISONE 1 MG PO TABS: ORAL_TABLET | ORAL | Status: DC

## 2011-09-02 NOTE — Assessment & Plan Note (Signed)
Probably exacerbated by the prednisone - brief increase in lasix to bid then back to qd

## 2011-09-02 NOTE — Progress Notes (Signed)
Subjective:    Patient ID: [name], female    DOB: [birth year], 77 y.o.   MRN: [id number]  HPI 77 former smoker, dx with COPD about 1999, also hx breast CA, CVA with R frontal AVM, colonic AVM's. Also diastolic dysfxn followed Dr Gala Romney. Has been treated with Spiriva + Advair, since 2003.   September 21, 2010--Presents for an acute office visit. Complains of wheezing, increased SOB, dry cough x2-3days. Complains of last 4-5 months has been on abx and steroids each month. As soon as she finishes her course symptoms begin to come back with cough, wheezing and dyspnea. Last 2 days, dry cough w/ wheezing. No discolored mucus. Denies chest pain, orthopnea, hemoptysis, fever, n/v/d, edema, headache.   10/30/10 -- follow up severe COPD. Flare last visit, treated with pred and abx, changed Advair to Symbicort to see if this helped with UA irritation and cough. We left her on Pred 10mg  by mouth once daily with plans to f/u and decide whether to stay on chronically.   02/14/11 Acute OV  Pt presents for a work in visit. Complains of increased SOB w/ exertion and sats into 70s w/ exertion, HR varying from the upper 40s to the 120s. Pt complains that over last couple of weeks she feels tired , low energy and more short of breath with walking. She has marathon helios that she has noticed she desats with walking into the 70s. She uses the demand setting. On the continuous flow she does not desat. Today in the office no desaturation with walking on 3 l/m continuous flow. HR ranges 100-120 with walking and at rest. On her portable oximeter her HR avg 40-120 , in the office HR maintained ~106-120 with walking . EKG w/ no acute changes w/ HR ~90 at rest. Does feel tight in chest with walking. Has some occasional wheezing. No discolored mucus or fever. Ankles have been puffy for last several days.  Recently changed from her bisoprolol to cardizem by cardiology.  ROV 03/05/11 -- severe COPD w hypoxemia. As above, recently  seen for DOE and labile HR. Since last time was admitted for anemia, Hgb 6.0. In after math feels her breathing and fatigue are improved. Returns today for f/u. She had to have AVM's cauterized. Has been on chronic pred for last 6 months, currently on 5mg  daily. Her goal is to taper to zero. Currently on Symbicort, Spiriva, uses SABA prn not every day.   ROV 04/03/11 -- severe COPD. Last time we decreased Pred to 3mg  qd. Since our visit she was readmitted for anemia, she is being followed by GI (Dr Mechele Collin). She has had endoscopy, CSY, capsule endoscopy. She feels washed out from the anemia. No real breathing change, wheezing, coughing.   ROV 06/04/11 - written note. Decreased Pred to 1mg  qd.  ROV 07/29/11 -- severe COPD with hypoxemia, tapering steroids. Last time we decreased to 1mg  daily.  She presents with worsening dyspnea, wheeze. No real cough. Her SABA use has increased over the last week. She is less mobile in the home, needed wheelchair.  >>admitted   [facility]/12/2011 Post [facility]  Admitted 11/12-11/15 for AECOPD , tx w/ IV abx , steroids and nebs. CXR without acute process.  Discharged on zpack and steroid taper. She is feeling better with decreased cough and dyspnea  Continued on Spiriva and Symbicort .  Currently on prednisone 10mg  daily  No chest pain , discolored mucus or fever   ROV 09/02/11 -- Severe COPD, has been tapering chronic pred.  Hosp for AE in early Nov '12. Prednisone is down to 5mg  now. She has experienced B LE edema over the last month (? Can due to pred).     Objective:   Physical Exam GEN: A/Ox3; pleasant , NAD, elderly female   HEENT:  [place]/AT,  EACs-clear, TMs-wnl, NOSE-clear, THROAT-clear, no lesions, no postnasal drip or exudate noted.   NECK:  Supple w/ fair ROM; no JVD; normal carotid impulses w/o bruits; no thyromegaly or nodules palpated; no lymphadenopathy.  RESP  No wheezeno accessory muscle use, no dullness to percussion, coarse BS   CARD:  RRR, Gr 1/6  SM  Tr-1 + peripheral edema, pulses intact, no cyanosis or clubbing.  Musco: Warm bil, no deformities or joint swelling noted.   Neuro: alert, no focal deficits noted.    Skin: Warm, no lesions or rashes   Assessment & Plan:

## 2011-09-02 NOTE — Patient Instructions (Signed)
Please continue your same inhaled medications  Continue your oxygen We will decrease your prednisone to 3mg  daily Increase your lasix to 20mg  twice a day for the next 3 days, then go back to the usual 20mg  daily Follow in 1 month to consider decreasing prednisone further.  Call our office if your breathing suffers in any way with the change in prednisone.

## 2011-09-02 NOTE — Assessment & Plan Note (Signed)
Please continue your same inhaled medications  Continue your oxygen We will decrease your prednisone to 3mg daily Increase your lasix to 20mg twice a day for the next 3 days, then go back to the usual 20mg daily Follow in 1 month to consider decreasing prednisone further.  Call our office if your breathing suffers in any way with the change in prednisone.  

## 2011-09-13 ENCOUNTER — Telehealth: Payer: Self-pay | Admitting: Internal Medicine

## 2011-09-15 ENCOUNTER — Inpatient Hospital Stay: Payer: Medicare Other | Admitting: Internal Medicine

## 2011-09-30 ENCOUNTER — Ambulatory Visit: Payer: Self-pay | Admitting: Unknown Physician Specialty

## 2011-10-01 ENCOUNTER — Ambulatory Visit: Payer: Medicare Other | Admitting: Physician Assistant

## 2011-10-03 ENCOUNTER — Ambulatory Visit (INDEPENDENT_AMBULATORY_CARE_PROVIDER_SITE_OTHER): Payer: Medicare Other | Admitting: Emergency Medicine

## 2011-10-03 ENCOUNTER — Encounter: Payer: Self-pay | Admitting: Physician Assistant

## 2011-10-03 ENCOUNTER — Ambulatory Visit (INDEPENDENT_AMBULATORY_CARE_PROVIDER_SITE_OTHER): Payer: Medicare Other | Admitting: Physician Assistant

## 2011-10-03 ENCOUNTER — Encounter: Payer: Self-pay | Admitting: Emergency Medicine

## 2011-10-03 DIAGNOSIS — I10 Essential (primary) hypertension: Secondary | ICD-10-CM

## 2011-10-03 DIAGNOSIS — I6529 Occlusion and stenosis of unspecified carotid artery: Secondary | ICD-10-CM

## 2011-10-03 DIAGNOSIS — I421 Obstructive hypertrophic cardiomyopathy: Secondary | ICD-10-CM

## 2011-10-03 DIAGNOSIS — J449 Chronic obstructive pulmonary disease, unspecified: Secondary | ICD-10-CM

## 2011-10-03 DIAGNOSIS — I2510 Atherosclerotic heart disease of native coronary artery without angina pectoris: Secondary | ICD-10-CM

## 2011-10-03 DIAGNOSIS — D649 Anemia, unspecified: Secondary | ICD-10-CM

## 2011-10-03 DIAGNOSIS — R609 Edema, unspecified: Secondary | ICD-10-CM

## 2011-10-03 DIAGNOSIS — R0602 Shortness of breath: Secondary | ICD-10-CM

## 2011-10-03 DIAGNOSIS — R079 Chest pain, unspecified: Secondary | ICD-10-CM

## 2011-10-03 NOTE — Assessment & Plan Note (Signed)
-   will leave pred at 3mg  qd for now - same BD regimen - discussed importance of good compliance w her O2 - rov 2 months

## 2011-10-03 NOTE — Assessment & Plan Note (Signed)
Continue aspirin and statin.  Her chest discomfort is atypical.  She had non-obstructive disease by cardiac catheterization 1/11.  I do not think that she needs further ischemic workup at this time.

## 2011-10-03 NOTE — Patient Instructions (Signed)
Your physician recommends that you schedule a follow-up appointment in: 2-3 weeks with Dr Gala Romney in the [facility] Your physician recommends that you return for lab work in: today (BMP, BNP, CBC) Your physician has requested that you have an echocardiogram. Echocardiography is a painless test that uses sound waves to create images of your heart. It provides your doctor with information about the size and shape of your heart and how well your heart's chambers and valves are working. This procedure takes approximately one hour. There are no restrictions for this procedure.

## 2011-10-03 NOTE — Progress Notes (Signed)
[address] [place], [place]  [address] Phone: [phone] Fax:  [phone]  Date:  10/03/2011   Name:  [name]       DOB:  [birth year] MRN:  [id number]  PCP:  Dr. Madaline Guthrie Primary Cardiologist:  Dr. Arvilla Meres  Primary Electrophysiologist:  None    History of Present Illness: [name] is a 78 y.o. female who presents for follow up.  She has a history of COPD, hypertension, hyperlipidemia, and carotid artery disease. She is status post multiple CVAs in the setting of a previous left carotid stenosis, which she has underwent endarterectomy before at [facility] in 2005.  She had ABIS, abd u/s and carotid u/s.  ABIs and abdominal u/s were normal. Left carotids 10/12: Bilateral 60-79%.  L.Cath 1/11 showed Mild non-obs CAD with ~50% ostial RCA lesion. Left system normal. EF 70%. Mild PAH. Right atrial pressure mean of 8, RV pressure 43/6 with an EDP of 14, PA pressure 42/17 with a mean of 29, pulmonary capillary wedge pressure was mean of 15. LV was 162/10 with an EDP of 20. Central aortic pressure 154/70 with a mean of 101. Fick cardiac output 5.0 liters per minute. Cardiac index 2.6 liters per minute per meter squared. Pulmonary vascular resistance was 2.8 Woods units.  Echo 10/10 with evidence of HOCM: EF 65-70%, moderate to severe asymmetric LV hypertrophy, no LVOT gradient at rest, no Valsalva performed, mild MR, mild SAM, mild LAE.  MRI performed which was negative for hyperenhancment, EF 69%.  Last seen by Dr. Arvilla Meres 3/12.    She has had a complicated course in the last several months.  She has been admitted to [facility] 3 times for significant anemia.  Apparently a colonoscopy demonstrated a colonic AVM.  This was cauterized.  She's had at least 3 colonoscopies in the last 6 months.  She's received several transfusions with packed blood cells.  Her last transfusion was about 2 weeks ago.  She was admitted with a hemoglobin of 7.   She's also been admitted to [facility] in 11/12 for COPD exacerbation.  Her primary care doctor had recently diagnosed her with CHF.  She wanted to be seen by cardiology for second opinion.  She has chronic shortness of breath.  Her breathing was worse when her hemoglobin was lower.  She's also noted lower extremity edema over the last several weeks.  She is now wearing compression hose.  She sleeps on one to 2 pillows without significant change.  She denies any recent PND.  She has occasional chest pains.  She cannot really qualify these.  It last 3-5 minutes.  She denies exertional chest discomfort.  She denies associated shortness of breath, radiating symptoms.  She denies any syncope.  She wears oxygen at night.  She is somewhat limited by back injury.  This occurred one month ago.  Prior to that, she was walking on a NuStep 30 mins a day.    Past Medical History  Diagnosis Date  . COPD (chronic obstructive pulmonary disease)   . Hypertrophic obstructive cardiomyopathy     Echo 10/10 EF 65-70% SW 1.6 PW 1.4 mild SAM no LVOT gradient at rest. Valsalva not performed Mild MR. No hyperenhance ment on MRI EF 69%.  . Coronary atherosclerosis of native coronary artery     Cath 1/11: RCA 50-60 ostial o/w normal. EF 70%.   . Pulmonary hypertension     Cath 1/11 RA 8, RV 43/6/14, PA 42/17 (  29) PCWP 15 Fick  5.0/2.6. PVR 2.8    . Edema   . Breast cancer   . Occlusion and stenosis of carotid artery     s/p L CEA u/s 3/11. R 60-79%; L 40-59%. no change since 3/10  . Peripheral vascular disease, unspecified   . Hypertension   . Dyspnea   . Emphysema   . Asthma   . Hypothyroidism   . Stroke   . Allergic rhinitis   . Gastric AVM     Current Outpatient Prescriptions  Medication Sig Dispense Refill  . albuterol (PROVENTIL HFA;VENTOLIN HFA) 108 (90 BASE) MCG/ACT inhaler Inhale 2 puffs into the lungs every 6 (six) hours as needed.  1 Inhaler  5  . aspirin 81 MG EC tablet Take 81 mg by mouth daily.         Marland Kitchen atorvastatin (LIPITOR) 20 MG tablet Take 20 mg by mouth daily.        . budesonide-formoterol (SYMBICORT) 160-4.5 MCG/ACT inhaler Inhale 2 puffs into the lungs 2 (two) times daily.  1 Inhaler  5  . Calcium Carbonate (CALCIUM 600) 1500 MG TABS Take 1 tablet by mouth daily.        Marland Kitchen darifenacin (ENABLEX) 7.5 MG 24 hr tablet Take 7.5 mg by mouth daily.        Marland Kitchen DETROL LA 2 MG 24 hr capsule Once daily      . diltiazem (CARDIZEM CD) 120 MG 24 hr capsule Take 1 capsule (120 mg total) by mouth daily.  30 capsule  6  . fexofenadine (ALLEGRA) 180 MG tablet Take 180 mg by mouth daily.        . furosemide (LASIX) 20 MG tablet Take 20 mg by mouth daily.        Marland Kitchen HYDROcodone-acetaminophen (VICODIN) 5-500 MG per tablet 1 by mouth every 4-6 hours as needed      . levothyroxine (SYNTHROID, LEVOTHROID) 112 MCG tablet Take 112 mcg by mouth daily.       Marland Kitchen levothyroxine (SYNTHROID, LEVOTHROID) 50 MCG tablet Take 50 mcg by mouth daily.        . Multiple Vitamins-Minerals (ONE-A-DAY EXTRAS ANTIOXIDANT) CAPS Take 1 capsule by mouth daily.        . pantoprazole (PROTONIX) 40 MG tablet Take 1 tablet (40 mg total) by mouth 2 (two) times daily.  60 tablet  5  . predniSONE (DELTASONE) 1 MG tablet Take 3 mg by mouth daily. Take as directed      . tiotropium (SPIRIVA) 18 MCG inhalation capsule Place 1 capsule (18 mcg total) into inhaler and inhale daily.  30 capsule  5    Allergies: Allergies  Allergen Reactions  . Latex     History  Substance Use Topics  . Smoking status: Former Smoker -- 1.0 packs/day for 30 years    Types: Cigarettes    Quit date: 09/16/1997  . Smokeless tobacco: Never Used  . Alcohol Use: No     ROS:  Please see the history of present illness.   Notes a weight loss of 20 lbs in 6 mos, but does note she has changed her diet.  All other systems reviewed and negative.   PHYSICAL EXAM: VS:  BP 140/72  Resp 18  Ht 5\' 6"  (1.676 m)  Wt 160 lb 12.8 oz (72.938 kg)  BMI 25.95 kg/m2 Well  nourished, well developed, in no acute distress HEENT: normal Neck: no JVD Endocrine:  No thyromegaly Cardiac:  normal S1, S2; RRR; 2/6 systolic  murmur along the left sternal border which increases with Valsalva Lungs:  Decreased breath sounds bilaterally, no wheezing, rhonchi or rales Abd: soft, nontender, no hepatomegaly Ext: Trace-1+ bilateral edema, Bilateral compression hose noted Skin: warm and dry Neuro:  CNs 2-12 intact, no focal abnormalities noted Psych: Normal affect  EKG:   Sinus rhythm, heart rate 79, left axis deviation, inferior Q waves, poor R-wave progression, no change from prior tracings  ASSESSMENT AND PLAN:

## 2011-10-03 NOTE — Assessment & Plan Note (Signed)
Suspect that this is multifactorial, but in part related to diastolic fxn, and also secondary PAH due to lung disease, hypoxemia, maybe contribution of anemia. She may need a change to diuretic regimen, but I believe she will also benefit from higher Hgb, tight control COPD, good O2 compliance

## 2011-10-03 NOTE — Assessment & Plan Note (Signed)
Followup echocardiogram. 

## 2011-10-03 NOTE — Assessment & Plan Note (Signed)
Carotid Dopplers stable 10/12.

## 2011-10-03 NOTE — Patient Instructions (Signed)
Please continue your inhaled medications and oxygen as you are using them  Follow with Dr Delton Coombes in 2 months

## 2011-10-03 NOTE — Assessment & Plan Note (Signed)
I believe that her edema and shortness of breath is multifactorial.  With her pulmonary hypertension, she likely has a right heart failure component.  With her HOCM, she also has a diastolic component.  Her shortness of breath is worsened by her underlying significant COPD.  Her symptoms are also exacerbated by her significant anemia which has been a significant problem of late.  At this point, her volume appears fairly stable.  I would not push her diuretics at this point without objective evidence of congestive heart failure.  Currently her neck veins are flat.  We had a long discussion regarding her condition.  I will check a BNP.  If this is significantly elevated, we can increase her Lasix.  Otherwise, continue her current dose.  Obtain a followup echocardiogram.  I will also check a basic metabolic panel and CBC today.  I will bring her back in followup with Dr. Arvilla Meres in 2-3 weeks.

## 2011-10-03 NOTE — Assessment & Plan Note (Signed)
Multifactorial as noted.  Check a BNP as noted and adjust her Lasix as necessary.

## 2011-10-03 NOTE — Assessment & Plan Note (Signed)
Borderline control.  Continue current therapy.

## 2011-10-03 NOTE — Progress Notes (Signed)
Subjective:    Patient ID: [name], female    DOB: [birth year], 78 y.o.   MRN: [id number] HPI 33 former smoker, dx with COPD about 1999, also hx breast CA, CVA with R frontal AVM, colonic AVM's. Also diastolic dysfxn followed Dr Gala Romney. Has been treated with Spiriva + Advair, since 2003.   September 21, 2010--Presents for an acute office visit. Complains of wheezing, increased SOB, dry cough x2-3days. Complains of last 4-5 months has been on abx and steroids each month. As soon as she finishes her course symptoms begin to come back with cough, wheezing and dyspnea. Last 2 days, dry cough w/ wheezing. No discolored mucus. Denies chest pain, orthopnea, hemoptysis, fever, n/v/d, edema, headache.   10/30/10 -- follow up severe COPD. Flare last visit, treated with pred and abx, changed Advair to Symbicort to see if this helped with UA irritation and cough. We left her on Pred 10mg  by mouth once daily with plans to f/u and decide whether to stay on chronically.   02/14/11 Acute OV  Pt presents for a work in visit. Complains of increased SOB w/ exertion and sats into 70s w/ exertion, HR varying from the upper 40s to the 120s. Pt complains that over last couple of weeks she feels tired , low energy and more short of breath with walking. She has marathon helios that she has noticed she desats with walking into the 70s. She uses the demand setting. On the continuous flow she does not desat. Today in the office no desaturation with walking on 3 l/m continuous flow. HR ranges 100-120 with walking and at rest. On her portable oximeter her HR avg 40-120 , in the office HR maintained ~106-120 with walking . EKG w/ no acute changes w/ HR ~90 at rest. Does feel tight in chest with walking. Has some occasional wheezing. No discolored mucus or fever. Ankles have been puffy for last several days.  Recently changed from her bisoprolol to cardizem by cardiology.  ROV 03/05/11 -- severe COPD w hypoxemia. As above, recently  seen for DOE and labile HR. Since last time was admitted for anemia, Hgb 6.0. In after math feels her breathing and fatigue are improved. Returns today for f/u. She had to have AVM's cauterized. Has been on chronic pred for last 6 months, currently on 5mg  daily. Her goal is to taper to zero. Currently on Symbicort, Spiriva, uses SABA prn not every day.   ROV 04/03/11 -- severe COPD. Last time we decreased Pred to 3mg  qd. Since our visit she was readmitted for anemia, she is being followed by GI (Dr Mechele Collin). She has had endoscopy, CSY, capsule endoscopy. She feels washed out from the anemia. No real breathing change, wheezing, coughing.   ROV 06/04/11 - written note. Decreased Pred to 1mg  qd.  ROV 07/29/11 -- severe COPD with hypoxemia, tapering steroids. Last time we decreased to 1mg  daily.  She presents with worsening dyspnea, wheeze. No real cough. Her SABA use has increased over the last week. She is less mobile in the home, needed wheelchair.  >>admitted   [facility]/12/2011 Post Hospital  Admitted 11/12-11/15 for AECOPD , tx w/ IV abx , steroids and nebs. CXR without acute process.  Discharged on zpack and steroid taper. She is feeling better with decreased cough and dyspnea  Continued on Spiriva and Symbicort .  Currently on prednisone 10mg  daily  No chest pain , discolored mucus or fever   ROV 09/02/11 -- Severe COPD, has been tapering chronic pred. Hosp  for AE in early Nov '12. Prednisone is down to 5mg  now. She has experienced B LE edema over the last month (? Can due to pred).   ROV 10/03/11 -- Severe COPD, has been tapering chronic pred. Last time we decreased pred from 5mg  to 3mg  daily. She tells me that she is still having problems w anemia (has had colonic AVM cauterized before). Hx of diastolic dysfxn, ? R heart failure. She is planning to see cards today, may need diuretics adjusted.    Objective:   Physical Exam GEN: A/Ox3; pleasant , NAD, elderly female   HEENT:  [place]/AT,  EACs-clear,  TMs-wnl, NOSE-clear, THROAT-clear, no lesions, no postnasal drip or exudate noted.   NECK:  Supple w/ fair ROM; no JVD; normal carotid impulses w/o bruits; no thyromegaly or nodules palpated; no lymphadenopathy.  RESP  No wheezeno accessory muscle use, no dullness to percussion, coarse BS   CARD:  RRR, Gr 1/6 SM  Tr-1 + peripheral edema, pulses intact, no cyanosis or clubbing.  Musco: Warm bil, no deformities or joint swelling noted.   Neuro: alert, no focal deficits noted.    Skin: Warm, no lesions or rashes   Assessment & Plan:   COPD - will leave pred at 3mg  qd for now - same BD regimen - discussed importance of good compliance w her O2 - rov 2 months  EDEMA Suspect that this is multifactorial, but in part related to diastolic fxn, and also secondary PAH due to lung disease, hypoxemia, maybe contribution of anemia. She may need a change to diuretic regimen, but I believe she will also benefit from higher Hgb, tight control COPD, good O2 compliance

## 2011-10-04 ENCOUNTER — Telehealth: Payer: Self-pay | Admitting: *Deleted

## 2011-10-04 LAB — CBC WITH DIFFERENTIAL/PLATELET
Basophils Absolute: 0 10*3/uL (ref 0.0–0.1)
Hemoglobin: 11.4 g/dL — ABNORMAL LOW (ref 12.0–15.0)
Lymphocytes Relative: 25.3 % (ref 12.0–46.0)
Monocytes Relative: 7.1 % (ref 3.0–12.0)
Neutro Abs: 5.7 10*3/uL (ref 1.4–7.7)
Neutrophils Relative %: 64.9 % (ref 43.0–77.0)
Platelets: 307 10*3/uL (ref 150.0–400.0)
RDW: 23.3 % — ABNORMAL HIGH (ref 11.5–14.6)

## 2011-10-04 LAB — BASIC METABOLIC PANEL
BUN: 13 mg/dL (ref 6–23)
GFR: 124.27 mL/min (ref >60.00)
Potassium: 3.6 mEq/L (ref 3.5–5.1)

## 2011-10-04 LAB — BRAIN NATRIURETIC PEPTIDE: Pro B Natriuretic peptide (BNP): 36 pg/mL (ref 0.0–100.0)

## 2011-10-04 NOTE — Telephone Encounter (Signed)
10/04/11-1545p--BMET routed to dr beat steiner--nt

## 2011-10-22 ENCOUNTER — Other Ambulatory Visit (INDEPENDENT_AMBULATORY_CARE_PROVIDER_SITE_OTHER): Payer: Medicare Other | Admitting: *Deleted

## 2011-10-22 DIAGNOSIS — I421 Obstructive hypertrophic cardiomyopathy: Secondary | ICD-10-CM

## 2011-10-25 ENCOUNTER — Encounter (HOSPITAL_COMMUNITY): Payer: Medicare Other

## 2011-11-01 ENCOUNTER — Ambulatory Visit (HOSPITAL_COMMUNITY)
Admission: RE | Admit: 2011-11-01 | Discharge: 2011-11-01 | Disposition: A | Discharge status: Home / Self Care | Payer: Medicare Other | Source: Ambulatory Visit | Attending: Internal Medicine | Admitting: Internal Medicine

## 2011-11-01 VITALS — BP 118/62 | HR 76 | Wt 159.4 lb

## 2011-11-01 DIAGNOSIS — R0609 Other forms of dyspnea: Secondary | ICD-10-CM | POA: Insufficient documentation

## 2011-11-01 DIAGNOSIS — R0602 Shortness of breath: Secondary | ICD-10-CM

## 2011-11-01 DIAGNOSIS — R0989 Other specified symptoms and signs involving the circulatory and respiratory systems: Secondary | ICD-10-CM | POA: Insufficient documentation

## 2011-11-01 NOTE — Progress Notes (Signed)
PCP: Osborne Oman  HPI: [name] is a delightful 78 year old woman with a history of COPD, hypertension, hyperlipidemia, and peripheral arterial disease.  She is status post multiple CVAs in the setting of a previous left carotid stenosis, which she has underwent endarterectomy before at [facility] in 2005. She denies any history of known coronary artery disease.She does have an abnormal ECG with inferior and lateral Q waves but Myoview and echo have been normal.  She has had ABIS, ab u/s and carotid u/s. ABIs and abdominal u/s were normal. Carotids showed 60-79% R and 40-59% on L.Cath showed Mild non-obs CAD with ~50% ostial RCA lesion. Left system normal. EF 70%. Mild PAH. Right atrial pressure mean of 8, RV pressure 43/6 with an EDP of 14, PA pressure 42/17 with a mean of 29, pulmonary capillary wedge pressure was mean of 15.  LV was 162/10 with an EDP of 20. Central aortic pressure 154/70 with a mean of 101.  Fick cardiac output 5.0 liters per minute.  Cardiac index 2.6 liters per minute per meter squared.  Pulmonary vascular resistance was 2.8 Woods units.  She has had a complicated course in the last several months. She has been admitted to [facility] 3 times for significant anemia. Apparently a colonoscopy demonstrated a colonic AVM. This was cauterized. She's had at least 3 colonoscopies in the last 6 months. She's received several transfusions with packed blood cells. Her last transfusion was about 2 weeks ago. She was admitted with a hemoglobin of 7. Had negative UGI and capsule endo.    She's also been admitted to [facility] in 11/12 for COPD exacerbation. Her primary care doctor had recently diagnosed her with CHF. She wanted to be seen by cardiology for second opinion.  Recently seen by Tereso Newcomer. Echo ordered EF 55-65%. There was mild focal basal and mild concentric hypertrophy of the septum. LVOT gradient of 22 mm Hg that increases to 53 mm Hg with valsalva. BNP 36.  pRVSP 44mm HG.   Still having a bit of hard time getting around mostly due to low back pain. Seeing a Land. Now back on Nu-Step. Yesterday did 31 mins. Has been having some swelling. Wears O2 and not SOB on oxygen. No orthopnea or PND. Feels much better after she gets blood transfusions.   ROS: All systems negative except as listed in HPI, PMH and Problem List.  Past Medical History  Diagnosis Date  . COPD (chronic obstructive pulmonary disease)   . Hypertrophic obstructive cardiomyopathy     Echo 10/10 EF 65-70% SW 1.6 PW 1.4 mild SAM no LVOT gradient at rest. Valsalva not performed Mild MR. No hyperenhance ment on MRI EF 69%.  . Coronary atherosclerosis of native coronary artery     Cath 1/11: RCA 50-60 ostial o/w normal. EF 70%.   . Pulmonary hypertension     Cath 1/11 RA 8, RV 43/6/14, PA 42/17 (29) PCWP 15 Fick  5.0/2.6. PVR 2.8    . Edema   . Breast cancer   . Occlusion and stenosis of carotid artery     s/p L CEA u/s 3/11. R 60-79%; L 40-59%. no change since 3/10  . Peripheral vascular disease, unspecified   . Hypertension   . Dyspnea   . Emphysema   . Asthma   . Hypothyroidism   . Stroke   . Allergic rhinitis   . Gastric AVM     Current Outpatient Prescriptions on File Prior to Encounter  Medication Sig Dispense  Refill  . albuterol (PROVENTIL HFA;VENTOLIN HFA) 108 (90 BASE) MCG/ACT inhaler Inhale 2 puffs into the lungs every 6 (six) hours as needed.  1 Inhaler  5  . aspirin 81 MG EC tablet Take 81 mg by mouth daily.        Marland Kitchen atorvastatin (LIPITOR) 20 MG tablet Take 20 mg by mouth daily.        . budesonide-formoterol (SYMBICORT) 160-4.5 MCG/ACT inhaler Inhale 2 puffs into the lungs 2 (two) times daily.  1 Inhaler  5  . Calcium Carbonate (CALCIUM 600) 1500 MG TABS Take 1 tablet by mouth daily.        Marland Kitchen darifenacin (ENABLEX) 7.5 MG 24 hr tablet Take 7.5 mg by mouth daily.        Marland Kitchen DETROL LA 2 MG 24 hr capsule Once daily      . diltiazem (CARDIZEM CD) 120 MG 24 hr  capsule Take 1 capsule (120 mg total) by mouth daily.  30 capsule  6  . fexofenadine (ALLEGRA) 180 MG tablet Take 180 mg by mouth daily.        . furosemide (LASIX) 20 MG tablet Take 20 mg by mouth daily.        Marland Kitchen HYDROcodone-acetaminophen (VICODIN) 5-500 MG per tablet 1 by mouth every 4-6 hours as needed      . levothyroxine (SYNTHROID, LEVOTHROID) 112 MCG tablet Take 112 mcg by mouth daily.       Marland Kitchen levothyroxine (SYNTHROID, LEVOTHROID) 50 MCG tablet Take 50 mcg by mouth daily.        . Multiple Vitamins-Minerals (ONE-A-DAY EXTRAS ANTIOXIDANT) CAPS Take 1 capsule by mouth daily.        . pantoprazole (PROTONIX) 40 MG tablet Take 1 tablet (40 mg total) by mouth 2 (two) times daily.  60 tablet  5  . predniSONE (DELTASONE) 1 MG tablet Take 3 mg by mouth daily. Take as directed      . tiotropium (SPIRIVA) 18 MCG inhalation capsule Place 1 capsule (18 mcg total) into inhaler and inhale daily.  30 capsule  5    PHYSICAL EXAM: Filed Vitals:   11/01/11 1046  BP: 118/62  Pulse: 76   Elderly.  well developed, in no acute distress  HEENT: normal  Neck: JVP 6-7 Endocrine: No thyromegaly  Cardiac: normal S1, S2; RRR; 2/6 systolic murmur along the left sternal border which increases with Valsalva  Lungs: Decreased breath sounds bilaterally, no wheezing, rhonchi or rales  Abd: soft, nontender, no hepatomegaly  Ext: No edema, Bilateral compression hose noted  Skin: warm and dry  Neuro: CNs 2-12 intact, no focal abnormalities noted  Psych: Normal affect   ASSESSMENT & PLAN:

## 2011-11-02 NOTE — Assessment & Plan Note (Addendum)
I had a long talk with Ms. Jernigan and her niece about the situation. I explained the mechanisms of diastolic HF at length and told them she was at high risk for this based on her age, myocardial stiffness and HOCM. That said her recent BNP was only 36 and thus I suspect her recent dyspnea may be more due to her COPD and anemia though it can be exacerbated by periods of diastolic HF. Currently her volume status looks quite good. Reinforced need for daily weights and reviewed use of sliding scale diuretics and she knows to take extra lasix when her weight is up more than 5 pounds from baseline.   Total time spent 45 minutes.

## 2011-11-19 ENCOUNTER — Other Ambulatory Visit: Payer: Medicare Other | Admitting: *Deleted

## 2011-12-03 ENCOUNTER — Encounter: Payer: Self-pay | Admitting: Emergency Medicine

## 2011-12-03 ENCOUNTER — Ambulatory Visit (INDEPENDENT_AMBULATORY_CARE_PROVIDER_SITE_OTHER): Payer: Medicare Other | Admitting: Emergency Medicine

## 2011-12-03 VITALS — BP 104/70 | HR 88 | Temp 97.9°F | Ht 65.5 in | Wt 160.6 lb

## 2011-12-03 DIAGNOSIS — J449 Chronic obstructive pulmonary disease, unspecified: Secondary | ICD-10-CM

## 2011-12-03 MED ORDER — BUDESONIDE-FORMOTEROL FUMARATE 160-4.5 MCG/ACT IN AERO: 2.0000 | INHALATION_SPRAY | Freq: Two times a day (BID) | RESPIRATORY_TRACT | Status: DC

## 2011-12-03 MED ORDER — PREDNISONE 1 MG PO TABS: 3.0000 mg | ORAL_TABLET | Freq: Every day | ORAL | Status: DC

## 2011-12-03 MED ORDER — TIOTROPIUM BROMIDE MONOHYDRATE 18 MCG IN CAPS: 18.0000 ug | ORAL_CAPSULE | Freq: Every day | RESPIRATORY_TRACT | Status: DC

## 2011-12-03 MED ORDER — ALBUTEROL SULFATE HFA 108 (90 BASE) MCG/ACT IN AERS: 2.0000 | INHALATION_SPRAY | Freq: Four times a day (QID) | RESPIRATORY_TRACT | Status: DC | PRN

## 2011-12-03 NOTE — Progress Notes (Signed)
Subjective:    Patient ID: [name], female    DOB: Jan 06, 1933, 78 y.o.   MRN: [id number] HPI 67 former smoker, dx with COPD about 1999, also hx breast CA, CVA with R frontal AVM, colonic AVM's. Also diastolic dysfxn followed Dr Gala Romney. Has been treated with Spiriva + Advair, since 2003. Now on Symbicort  September 21, 2010--Presents for an acute office visit. Complains of wheezing, increased SOB, dry cough x2-3days. Complains of last 4-5 months has been on abx and steroids each month. As soon as she finishes her course symptoms begin to come back with cough, wheezing and dyspnea. Last 2 days, dry cough w/ wheezing. No discolored mucus. Denies chest pain, orthopnea, hemoptysis, fever, n/v/d, edema, headache.   10/30/10 -- follow up severe COPD. Flare last visit, treated with pred and abx, changed Advair to Symbicort to see if this helped with UA irritation and cough. We left her on Pred 10mg  by mouth once daily with plans to f/u and decide whether to stay on chronically.   02/14/11 Acute OV  Pt presents for a work in visit. Complains of increased SOB w/ exertion and sats into 70s w/ exertion, HR varying from the upper 40s to the 120s. Pt complains that over last couple of weeks she feels tired , low energy and more short of breath with walking. She has marathon helios that she has noticed she desats with walking into the 70s. She uses the demand setting. On the continuous flow she does not desat. Today in the office no desaturation with walking on 3 l/m continuous flow. HR ranges 100-120 with walking and at rest. On her portable oximeter her HR avg 40-120 , in the office HR maintained ~106-120 with walking . EKG w/ no acute changes w/ HR ~90 at rest. Does feel tight in chest with walking. Has some occasional wheezing. No discolored mucus or fever. Ankles have been puffy for last several days.  Recently changed from her bisoprolol to cardizem by cardiology.  ROV 03/05/11 -- severe COPD w hypoxemia. As  above, recently seen for DOE and labile HR. Since last time was admitted for anemia, Hgb 6.0. In after math feels her breathing and fatigue are improved. Returns today for f/u. She had to have AVM's cauterized. Has been on chronic pred for last 6 months, currently on 5mg  daily. Her goal is to taper to zero. Currently on Symbicort, Spiriva, uses SABA prn not every day.   ROV 04/03/11 -- severe COPD. Last time we decreased Pred to 3mg  qd. Since our visit she was readmitted for anemia, she is being followed by GI (Dr Mechele Collin). She has had endoscopy, CSY, capsule endoscopy. She feels washed out from the anemia. No real breathing change, wheezing, coughing.   ROV 06/04/11 - written note. Decreased Pred to 1mg  qd.  ROV 07/29/11 -- severe COPD with hypoxemia, tapering steroids. Last time we decreased to 1mg  daily.  She presents with worsening dyspnea, wheeze. No real cough. Her SABA use has increased over the last week. She is less mobile in the home, needed wheelchair.  >>admitted   [facility]  Admitted 11/12-11/15 for AECOPD , tx w/ IV abx , steroids and nebs. CXR without acute process.  Discharged on zpack and steroid taper. She is feeling better with decreased cough and dyspnea  Continued on Spiriva and Symbicort .  Currently on prednisone 10mg  daily  No chest pain , discolored mucus or fever   ROV 09/02/11 -- Severe COPD, has been tapering chronic  pred. Hosp for AE in early Nov '12. Prednisone is down to 5mg  now. She has experienced B LE edema over the last month (? Can due to pred).   ROV 10/03/11 -- Severe COPD, has been tapering chronic pred. Last time we decreased pred from 5mg  to 3mg  daily. She tells me that she is still having problems w anemia (has had colonic AVM cauterized before). Hx of diastolic dysfxn, ? R heart failure. She is planning to see cards today, may need diuretics adjusted.   ROV 12/03/11 -- Severe COPD, diastolic dysfxn, tapering chronic pred currently on 3mg . She  is on Zyrtec, has been having some red eyes. Wearing O2 at night and prn. On spiriva + symbicort + SABA, averages once every few weeks.    Objective:   Physical Exam GEN: A/Ox3; pleasant , NAD, elderly female   HEENT:  [place]/AT,  EACs-clear, TMs-wnl, NOSE-clear, THROAT-clear, no lesions, no postnasal drip or exudate noted.   NECK:  Supple w/ fair ROM; no JVD; normal carotid impulses w/o bruits; no thyromegaly or nodules palpated; no lymphadenopathy.  RESP  No wheezeno accessory muscle use, no dullness to percussion, coarse BS   CARD:  RRR, Gr 1/6 SM  Tr-1 + peripheral edema, pulses intact, no cyanosis or clubbing.  Musco: Warm bil, no deformities or joint swelling noted.   Neuro: alert, no focal deficits noted.    Skin: Warm, no lesions or rashes   Assessment & Plan:   COPD Stable  - wean pred to 2mg  >> watch for adrenal insufficiency - same BD's - rov 2 months, consider further decrease in chronic pred at that time.

## 2011-12-03 NOTE — Patient Instructions (Signed)
Decrease your prednisone to 2mg  daily. Call our office if you notice weakness, fatigue, headache, dizziness when you change this dose.  Continue your inhaled medications as you are taking them Follow with Dr Delton Coombes in 2 months.

## 2011-12-03 NOTE — Assessment & Plan Note (Addendum)
Stable  - wean pred to 2mg  >> watch for adrenal insufficiency - same BD's - rov 2 months, consider further decrease in chronic pred at that time.

## 2011-12-10 ENCOUNTER — Encounter (INDEPENDENT_AMBULATORY_CARE_PROVIDER_SITE_OTHER): Payer: Medicare Other

## 2011-12-10 DIAGNOSIS — I6529 Occlusion and stenosis of unspecified carotid artery: Secondary | ICD-10-CM

## 2012-01-28 ENCOUNTER — Encounter: Payer: Self-pay | Admitting: Emergency Medicine

## 2012-01-28 ENCOUNTER — Ambulatory Visit (INDEPENDENT_AMBULATORY_CARE_PROVIDER_SITE_OTHER): Payer: Medicare Other | Admitting: Emergency Medicine

## 2012-01-28 VITALS — BP 120/56 | HR 90 | Temp 98.3°F | Ht 66.5 in | Wt 163.2 lb

## 2012-01-28 DIAGNOSIS — J4489 Other specified chronic obstructive pulmonary disease: Secondary | ICD-10-CM

## 2012-01-28 DIAGNOSIS — J449 Chronic obstructive pulmonary disease, unspecified: Secondary | ICD-10-CM

## 2012-01-28 NOTE — Progress Notes (Signed)
Subjective:    Patient ID: [name], female    DOB: Jan 06, 1933, 78 y.o.   MRN: [id number] HPI 67 former smoker, dx with COPD about 1999, also hx breast CA, CVA with R frontal AVM, colonic AVM's. Also diastolic dysfxn followed Dr Gala Romney. Has been treated with Spiriva + Advair, since 2003. Now on Symbicort  September 21, 2010--Presents for an acute office visit. Complains of wheezing, increased SOB, dry cough x2-3days. Complains of last 4-5 months has been on abx and steroids each month. As soon as she finishes her course symptoms begin to come back with cough, wheezing and dyspnea. Last 2 days, dry cough w/ wheezing. No discolored mucus. Denies chest pain, orthopnea, hemoptysis, fever, n/v/d, edema, headache.   10/30/10 -- follow up severe COPD. Flare last visit, treated with pred and abx, changed Advair to Symbicort to see if this helped with UA irritation and cough. We left her on Pred 10mg  by mouth once daily with plans to f/u and decide whether to stay on chronically.   02/14/11 Acute OV  Pt presents for a work in visit. Complains of increased SOB w/ exertion and sats into 70s w/ exertion, HR varying from the upper 40s to the 120s. Pt complains that over last couple of weeks she feels tired , low energy and more short of breath with walking. She has marathon helios that she has noticed she desats with walking into the 70s. She uses the demand setting. On the continuous flow she does not desat. Today in the office no desaturation with walking on 3 l/m continuous flow. HR ranges 100-120 with walking and at rest. On her portable oximeter her HR avg 40-120 , in the office HR maintained ~106-120 with walking . EKG w/ no acute changes w/ HR ~90 at rest. Does feel tight in chest with walking. Has some occasional wheezing. No discolored mucus or fever. Ankles have been puffy for last several days.  Recently changed from her bisoprolol to cardizem by cardiology.  ROV 03/05/11 -- severe COPD w hypoxemia. As  above, recently seen for DOE and labile HR. Since last time was admitted for anemia, Hgb 6.0. In after math feels her breathing and fatigue are improved. Returns today for f/u. She had to have AVM's cauterized. Has been on chronic pred for last 6 months, currently on 5mg  daily. Her goal is to taper to zero. Currently on Symbicort, Spiriva, uses SABA prn not every day.   ROV 04/03/11 -- severe COPD. Last time we decreased Pred to 3mg  qd. Since our visit she was readmitted for anemia, she is being followed by GI (Dr Mechele Collin). She has had endoscopy, CSY, capsule endoscopy. She feels washed out from the anemia. No real breathing change, wheezing, coughing.   ROV 06/04/11 - written note. Decreased Pred to 1mg  qd.  ROV 07/29/11 -- severe COPD with hypoxemia, tapering steroids. Last time we decreased to 1mg  daily.  She presents with worsening dyspnea, wheeze. No real cough. Her SABA use has increased over the last week. She is less mobile in the home, needed wheelchair.  >>admitted   [facility]2011 [facility]  Admitted 11/12-11/15 for AECOPD , tx w/ IV abx , steroids and nebs. CXR without acute process.  Discharged on zpack and steroid taper. She is feeling better with decreased cough and dyspnea  Continued on Spiriva and Symbicort .  Currently on prednisone 10mg  daily  No chest pain , discolored mucus or fever   ROV 09/02/11 -- Severe COPD, has been tapering chronic  pred. Hosp for AE in early Nov '12. Prednisone is down to 5mg  now. She has experienced B LE edema over the last month (? Can due to pred).   ROV 10/03/11 -- Severe COPD, has been tapering chronic pred. Last time we decreased pred from 5mg  to 3mg  daily. She tells me that she is still having problems w anemia (has had colonic AVM cauterized before). Hx of diastolic dysfxn, ? R heart failure. She is planning to see cards today, may need diuretics adjusted.   ROV 12/03/11 -- Severe COPD, diastolic dysfxn, tapering chronic pred currently on 3mg . She  is on Zyrtec, has been having some red eyes. Wearing O2 at night and prn. On spiriva + symbicort + SABA, averages once every few weeks.   ROV 01/28/12 -- Severe COPD, diastolic dysfxn, tapering chronic pred, last time decreased to 2mg  qd. She is a bit less active over the last year. Having good days and bad. She uses her O2 prn and at night. She has not flared.    Objective:   Physical Exam GEN: A/Ox3; pleasant , NAD, elderly female   HEENT:  [place]/AT,  EACs-clear, TMs-wnl, NOSE-clear, THROAT-clear, no lesions, no postnasal drip or exudate noted.   NECK:  Supple w/ fair ROM; no JVD; normal carotid impulses w/o bruits; no thyromegaly or nodules palpated; no lymphadenopathy.  RESP  No wheezeno accessory muscle use, no dullness to percussion, coarse BS   CARD:  RRR, Gr 1/6 SM  Tr-1 + peripheral edema, pulses intact, no cyanosis or clubbing.  Musco: Warm bil, no deformities or joint swelling noted.   Neuro: alert, no focal deficits noted.    Skin: Warm, no lesions or rashes   Assessment & Plan:   COPD - decrease pred to 1mg  and f/u 6 weeks - same BD's - walking oximetry

## 2012-01-28 NOTE — Patient Instructions (Addendum)
Please continue your same inhaled medications.  Decrease your prednisone to 1mg  daily. Call our office if you notice weakness, fatigue, headache, dizziness when you change this dose.  Walking oximetry today showed you need to wear your oxygen when you up walking or exerting.  Follow with Dr Delton Coombes in 6 weeks

## 2012-01-28 NOTE — Assessment & Plan Note (Signed)
-   decrease pred to 1mg  and f/u 6 weeks - same BD's - walking oximetry

## 2012-02-14 ENCOUNTER — Ambulatory Visit: Payer: Self-pay | Admitting: Family Medicine

## 2012-02-24 ENCOUNTER — Telehealth: Payer: Self-pay | Admitting: Emergency Medicine

## 2012-02-24 ENCOUNTER — Encounter: Payer: Self-pay | Admitting: Adult Health

## 2012-02-24 ENCOUNTER — Ambulatory Visit (INDEPENDENT_AMBULATORY_CARE_PROVIDER_SITE_OTHER): Payer: Medicare Other | Admitting: Adult Health

## 2012-02-24 VITALS — BP 118/72 | HR 90 | Temp 97.5°F | Ht 66.0 in | Wt 163.3 lb

## 2012-02-24 DIAGNOSIS — J449 Chronic obstructive pulmonary disease, unspecified: Secondary | ICD-10-CM

## 2012-02-24 MED ORDER — ALBUTEROL SULFATE (2.5 MG/3ML) 0.083% IN NEBU: 2.5000 mg | INHALATION_SOLUTION | Freq: Four times a day (QID) | RESPIRATORY_TRACT | Status: DC | PRN

## 2012-02-24 MED ORDER — ALBUTEROL SULFATE HFA 108 (90 BASE) MCG/ACT IN AERS: 2.0000 | INHALATION_SPRAY | Freq: Four times a day (QID) | RESPIRATORY_TRACT | Status: DC | PRN

## 2012-02-24 MED ORDER — PREDNISONE 10 MG PO TABS: ORAL_TABLET | ORAL | Status: DC

## 2012-02-24 MED ORDER — LEVOFLOXACIN 500 MG PO TABS: 500.0000 mg | ORAL_TABLET | Freq: Every day | ORAL | Status: DC

## 2012-02-24 MED ORDER — LEVALBUTEROL HCL 0.63 MG/3ML IN NEBU
0.6300 mg | INHALATION_SOLUTION | Freq: Once | RESPIRATORY_TRACT | Status: AC
  Administered 2012-02-24: 0.63 mg via RESPIRATORY_TRACT

## 2012-02-24 NOTE — Telephone Encounter (Signed)
I spoke with sister and pt c/o cough w/ yellow phlem, wheezing and SOB x 3-4 days. Pt is scheduled to come in and see TP today at 3:30 for an evaluation

## 2012-02-24 NOTE — Progress Notes (Signed)
Subjective:    Patient ID: [name], female    DOB: Jan 06, 1933, 78 y.o.   MRN: [id number] HPI 67 former smoker, dx with COPD about 1999, also hx breast CA, CVA with R frontal AVM, colonic AVM's. Also diastolic dysfxn followed Dr Gala Romney. Has been treated with Spiriva + Advair, since 2003. Now on Symbicort  September 21, 2010--Presents for an acute office visit. Complains of wheezing, increased SOB, dry cough x2-3days. Complains of last 4-5 months has been on abx and steroids each month. As soon as she finishes her course symptoms begin to come back with cough, wheezing and dyspnea. Last 2 days, dry cough w/ wheezing. No discolored mucus. Denies chest pain, orthopnea, hemoptysis, fever, n/v/d, edema, headache.   10/30/10 -- follow up severe COPD. Flare last visit, treated with pred and abx, changed Advair to Symbicort to see if this helped with UA irritation and cough. We left her on Pred 10mg  by mouth once daily with plans to f/u and decide whether to stay on chronically.   02/14/11 Acute OV  Pt presents for a work in visit. Complains of increased SOB w/ exertion and sats into 70s w/ exertion, HR varying from the upper 40s to the 120s. Pt complains that over last couple of weeks she feels tired , low energy and more short of breath with walking. She has marathon helios that she has noticed she desats with walking into the 70s. She uses the demand setting. On the continuous flow she does not desat. Today in the office no desaturation with walking on 3 l/m continuous flow. HR ranges 100-120 with walking and at rest. On her portable oximeter her HR avg 40-120 , in the office HR maintained ~106-120 with walking . EKG w/ no acute changes w/ HR ~90 at rest. Does feel tight in chest with walking. Has some occasional wheezing. No discolored mucus or fever. Ankles have been puffy for last several days.  Recently changed from her bisoprolol to cardizem by cardiology.  ROV 03/05/11 -- severe COPD w hypoxemia. As  above, recently seen for DOE and labile HR. Since last time was admitted for anemia, Hgb 6.0. In after math feels her breathing and fatigue are improved. Returns today for f/u. She had to have AVM's cauterized. Has been on chronic pred for last 6 months, currently on 5mg  daily. Her goal is to taper to zero. Currently on Symbicort, Spiriva, uses SABA prn not every day.   ROV 04/03/11 -- severe COPD. Last time we decreased Pred to 3mg  qd. Since our visit she was readmitted for anemia, she is being followed by GI (Dr Mechele Collin). She has had endoscopy, CSY, capsule endoscopy. She feels washed out from the anemia. No real breathing change, wheezing, coughing.   ROV 06/04/11 - written note. Decreased Pred to 1mg  qd.  ROV 07/29/11 -- severe COPD with hypoxemia, tapering steroids. Last time we decreased to 1mg  daily.  She presents with worsening dyspnea, wheeze. No real cough. Her SABA use has increased over the last week. She is less mobile in the home, needed wheelchair.  >>admitted   [facility]/12/2011 Post [facility]  Admitted 11/12-11/15 for AECOPD , tx w/ IV abx , steroids and nebs. CXR without acute process.  Discharged on zpack and steroid taper. She is feeling better with decreased cough and dyspnea  Continued on Spiriva and Symbicort .  Currently on prednisone 10mg  daily  No chest pain , discolored mucus or fever   ROV 09/02/11 -- Severe COPD, has been tapering chronic  pred. Hosp for AE in early Nov '12. Prednisone is down to 5mg  now. She has experienced B LE edema over the last month (? Can due to pred).   ROV 10/03/11 -- Severe COPD, has been tapering chronic pred. Last time we decreased pred from 5mg  to 3mg  daily. She tells me that she is still having problems w anemia (has had colonic AVM cauterized before). Hx of diastolic dysfxn, ? R heart failure. She is planning to see cards today, may need diuretics adjusted.   ROV 12/03/11 -- Severe COPD, diastolic dysfxn, tapering chronic pred currently on 3mg . She  is on Zyrtec, has been having some red eyes. Wearing O2 at night and prn. On spiriva + symbicort + SABA, averages once every few weeks.   ROV 01/28/12 -- Severe COPD, diastolic dysfxn, tapering chronic pred, last time decreased to 2mg  qd. She is a bit less active over the last year. Having good days and bad. She uses her O2 prn and at night. She has not flared.   02/24/2012 Acute OV  Complains of productive cough,sob ,wheezing for 4 days.  Barky cough , worse for last few days Currently on prednisone 2mg   No hemotpysis or increased edema. No fever.     ROS:  Constitutional:   No  weight loss, night sweats,  Fevers, chills,  +fatigue, or  lassitude.  HEENT:   No headaches,  Difficulty swallowing,  Tooth/dental problems, or  Sore throat,                No sneezing, itching, ear ache,  +nasal congestion, post nasal drip,   CV:  No chest pain,  Orthopnea, PND, swelling in lower extremities, anasarca, dizziness, palpitations, syncope.   GI  No heartburn, indigestion, abdominal pain, nausea, vomiting, diarrhea, change in bowel habits, loss of appetite, bloody stools.   Resp:  No coughing up of blood.     No chest wall deformity  Skin: no rash or lesions.  GU: no dysuria, change in color of urine, no urgency or frequency.  No flank pain, no hematuria   MS:  No joint pain or swelling.  No decreased range of motion.  No back pain.  Psych:  No change in mood or affect. No depression or anxiety.  No memory loss.       Objective:   Physical Exam GEN: A/Ox3; pleasant , NAD, elderly female   HEENT:  [place]/AT,  EACs-clear, TMs-wnl, NOSE-clear, THROAT-clear, no lesions, no postnasal drip or exudate noted.   NECK:  Supple w/ fair ROM; no JVD; normal carotid impulses w/o bruits; no thyromegaly or nodules palpated; no lymphadenopathy.  RESP  No wheezeno accessory muscle use, no dullness to percussion, coarse BS   CARD:  RRR, Gr 1/6 SM  Tr-1 + peripheral edema, pulses intact, no cyanosis or  clubbing.  Musco: Warm bil, no deformities or joint swelling noted.   Neuro: alert, no focal deficits noted.    Skin: Warm, no lesions or rashes   Assessment & Plan:

## 2012-02-24 NOTE — Patient Instructions (Addendum)
Levaquin 500mg  daily -take with food  Mucinex DM Twice daily  As needed  Cough/congestion  Increase Prednisone 20mg  daily for 3 days, 10mg  daily for 3 days , then 5mg  daily for 3 days then back 2mg  daily  follow up Dr. Delton Coombes  As planned next week and  As needed   Please contact office for sooner follow up if symptoms do not improve or worsen or seek emergency care

## 2012-02-26 NOTE — Assessment & Plan Note (Signed)
Flare   Plan:  Levaquin 500mg  daily -take with food  Mucinex DM Twice daily  As needed  Cough/congestion  Increase Prednisone 20mg  daily for 3 days, 10mg  daily for 3 days , then 5mg  daily for 3 days then back 2mg  daily  follow up Dr. Delton Coombes  As planned next week and  As needed   Please contact office for sooner follow up if symptoms do not improve or worsen or seek emergency care

## 2012-03-06 ENCOUNTER — Other Ambulatory Visit: Payer: Self-pay | Admitting: Internal Medicine

## 2012-03-06 ENCOUNTER — Encounter: Payer: Self-pay | Admitting: Emergency Medicine

## 2012-03-06 ENCOUNTER — Ambulatory Visit (INDEPENDENT_AMBULATORY_CARE_PROVIDER_SITE_OTHER): Payer: Medicare Other | Admitting: Emergency Medicine

## 2012-03-06 ENCOUNTER — Ambulatory Visit (INDEPENDENT_AMBULATORY_CARE_PROVIDER_SITE_OTHER)
Admission: RE | Admit: 2012-03-06 | Discharge: 2012-03-06 | Disposition: A | Discharge status: Home / Self Care | Payer: Medicare Other | Source: Ambulatory Visit | Attending: Emergency Medicine | Admitting: Emergency Medicine

## 2012-03-06 VITALS — BP 134/56 | HR 89 | Temp 98.4°F | Ht 66.0 in | Wt 166.0 lb

## 2012-03-06 DIAGNOSIS — J4489 Other specified chronic obstructive pulmonary disease: Secondary | ICD-10-CM

## 2012-03-06 DIAGNOSIS — J449 Chronic obstructive pulmonary disease, unspecified: Secondary | ICD-10-CM

## 2012-03-06 MED ORDER — DILTIAZEM HCL ER COATED BEADS 120 MG PO CP24: 120.0000 mg | ORAL_CAPSULE | Freq: Every day | ORAL | Status: DC

## 2012-03-06 MED ORDER — METHYLPREDNISOLONE ACETATE 80 MG/ML IJ SUSP
120.0000 mg | Freq: Once | INTRAMUSCULAR | Status: AC
  Administered 2012-03-06: 120 mg via INTRAMUSCULAR

## 2012-03-06 MED ORDER — MOXIFLOXACIN HCL 400 MG PO TABS: 400.0000 mg | ORAL_TABLET | Freq: Every day | ORAL | Status: AC

## 2012-03-06 MED ORDER — PREDNISONE 10 MG PO TABS: ORAL_TABLET | ORAL | Status: DC

## 2012-03-06 NOTE — Progress Notes (Signed)
Subjective:    Patient ID: [name], female    DOB: Jan 06, 1933, 78 y.o.   MRN: [id number] HPI 67 former smoker, dx with COPD about 1999, also hx breast CA, CVA with R frontal AVM, colonic AVM's. Also diastolic dysfxn followed Dr Gala Romney. Has been treated with Spiriva + Advair, since 2003. Now on Symbicort  September 21, 2010--Presents for an acute office visit. Complains of wheezing, increased SOB, dry cough x2-3days. Complains of last 4-5 months has been on abx and steroids each month. As soon as she finishes her course symptoms begin to come back with cough, wheezing and dyspnea. Last 2 days, dry cough w/ wheezing. No discolored mucus. Denies chest pain, orthopnea, hemoptysis, fever, n/v/d, edema, headache.   10/30/10 -- follow up severe COPD. Flare last visit, treated with pred and abx, changed Advair to Symbicort to see if this helped with UA irritation and cough. We left her on Pred 10mg  by mouth once daily with plans to f/u and decide whether to stay on chronically.   02/14/11 Acute OV  Pt presents for a work in visit. Complains of increased SOB w/ exertion and sats into 70s w/ exertion, HR varying from the upper 40s to the 120s. Pt complains that over last couple of weeks she feels tired , low energy and more short of breath with walking. She has marathon helios that she has noticed she desats with walking into the 70s. She uses the demand setting. On the continuous flow she does not desat. Today in the office no desaturation with walking on 3 l/m continuous flow. HR ranges 100-120 with walking and at rest. On her portable oximeter her HR avg 40-120 , in the office HR maintained ~106-120 with walking . EKG w/ no acute changes w/ HR ~90 at rest. Does feel tight in chest with walking. Has some occasional wheezing. No discolored mucus or fever. Ankles have been puffy for last several days.  Recently changed from her bisoprolol to cardizem by cardiology.  ROV 03/05/11 -- severe COPD w hypoxemia. As  above, recently seen for DOE and labile HR. Since last time was admitted for anemia, Hgb 6.0. In after math feels her breathing and fatigue are improved. Returns today for f/u. She had to have AVM's cauterized. Has been on chronic pred for last 6 months, currently on 5mg  daily. Her goal is to taper to zero. Currently on Symbicort, Spiriva, uses SABA prn not every day.   ROV 04/03/11 -- severe COPD. Last time we decreased Pred to 3mg  qd. Since our visit she was readmitted for anemia, she is being followed by GI (Dr Mechele Collin). She has had endoscopy, CSY, capsule endoscopy. She feels washed out from the anemia. No real breathing change, wheezing, coughing.   ROV 06/04/11 - written note. Decreased Pred to 1mg  qd.  ROV 07/29/11 -- severe COPD with hypoxemia, tapering steroids. Last time we decreased to 1mg  daily.  She presents with worsening dyspnea, wheeze. No real cough. Her SABA use has increased over the last week. She is less mobile in the home, needed wheelchair.  >>admitted   [facility]  Admitted 11/12-11/15 for AECOPD , tx w/ IV abx , steroids and nebs. CXR without acute process.  Discharged on zpack and steroid taper. She is feeling better with decreased cough and dyspnea  Continued on Spiriva and Symbicort .  Currently on prednisone 10mg  daily  No chest pain , discolored mucus or fever   ROV 09/02/11 -- Severe COPD, has been tapering chronic  pred. Hosp for AE in early Nov '12. Prednisone is down to 5mg  now. She has experienced B LE edema over the last month (? Can due to pred).   ROV 10/03/11 -- Severe COPD, has been tapering chronic pred. Last time we decreased pred from 5mg  to 3mg  daily. She tells me that she is still having problems w anemia (has had colonic AVM cauterized before). Hx of diastolic dysfxn, ? R heart failure. She is planning to see cards today, may need diuretics adjusted.   ROV 12/03/11 -- Severe COPD, diastolic dysfxn, tapering chronic pred currently on 3mg . She  is on Zyrtec, has been having some red eyes. Wearing O2 at night and prn. On spiriva + symbicort + SABA, averages once every few weeks.   ROV 01/28/12 -- Severe COPD, diastolic dysfxn, tapering chronic pred, last time decreased to 2mg  qd. She is a bit less active over the last year. Having good days and bad. She uses her O2 prn and at night. She has not flared.   02/24/2012 Acute OV  Complains of productive cough,sob ,wheezing for 4 days.  Barky cough , worse for last few days Currently on prednisone 2mg   No hemotpysis or increased edema. No fever.   ROV 03/06/12 -- Severe COPD, diastolic dysfxn, tapering chronic pred, last time decreased to 2mg  qd.  She was recently  Caught URI, was seen and treated as above. Now tapered back down to 2mg  Pred qd.  She continues to have cough and fatigue. The cough is non-prod. She is still having nasal gtt and throat congestion.    Objective:  Physical Exam Filed Vitals:   03/06/12 1536  BP: 134/56  Pulse: 89  Temp: 98.4 F (36.9 C)   GEN: A/Ox3; pleasant , NAD, elderly female   HEENT:  [place]/AT,  EACs-clear, TMs-wnl, NOSE-clear, THROAT-clear, no lesions, no postnasal drip or exudate noted.   NECK:  Supple w/ fair ROM; no JVD; normal carotid impulses w/o bruits; no thyromegaly or nodules palpated; no lymphadenopathy.  RESP  Bilateral diffuse wheezes  CARD:  RRR, Gr 1/6 SM  Tr-1 + peripheral edema, pulses intact, no cyanosis or clubbing.  Musco: Warm bil, no deformities or joint swelling noted.   Neuro: alert, no focal deficits noted.    Skin: Warm, no lesions or rashes   Assessment & Plan:   COPD Apparent exacerbation, ? Whether we treated too early or undertreated - CXR now - depomedrol - pred taper + levaquin - if CXR shows infiltrate then will admit to hospital - rov next week with TP

## 2012-03-06 NOTE — Patient Instructions (Addendum)
Please continue your inhaled medications Prednisone taper as directed  Avelox 400mg  daily for 7 days Follow with Tammy Parrett next week

## 2012-03-06 NOTE — Assessment & Plan Note (Addendum)
Apparent exacerbation, ? Whether we treated too early or undertreated - CXR now - depomedrol - pred taper + levaquin - if CXR shows infiltrate then will admit to hospital - rov next week with TP

## 2012-03-13 ENCOUNTER — Ambulatory Visit (INDEPENDENT_AMBULATORY_CARE_PROVIDER_SITE_OTHER): Payer: Medicare Other | Admitting: Adult Health

## 2012-03-13 ENCOUNTER — Encounter: Payer: Self-pay | Admitting: Adult Health

## 2012-03-13 VITALS — BP 112/70 | HR 87 | Temp 97.1°F | Ht 66.0 in | Wt 162.2 lb

## 2012-03-13 DIAGNOSIS — J449 Chronic obstructive pulmonary disease, unspecified: Secondary | ICD-10-CM

## 2012-03-13 MED ORDER — PREDNISONE 10 MG PO TABS: ORAL_TABLET | ORAL | Status: DC

## 2012-03-13 MED ORDER — BENZONATATE 200 MG PO CAPS: 200.0000 mg | ORAL_CAPSULE | Freq: Three times a day (TID) | ORAL | Status: AC | PRN

## 2012-03-13 NOTE — Patient Instructions (Addendum)
Finish Avelox  Mucinex DM Twice daily  As needed  Cough/congestion   Prednisone taper as directed and hold  5mg  daily  follow up Dr. Delton Coombes 4-6 weeks    Please contact office for sooner follow up if symptoms do not improve or worsen or seek emergency care

## 2012-03-13 NOTE — Assessment & Plan Note (Signed)
Slow to resolve flare -improving  Slow steroid taper down to 5mg  until seen back in office   Plan:  Finish Avelox  Mucinex DM Twice daily  As needed  Cough/congestion   Prednisone taper as directed and hold  5mg  daily  follow up Dr. Delton Coombes 4-6 weeks    Please contact office for sooner follow up if symptoms do not improve or worsen or seek emergency care

## 2012-03-13 NOTE — Progress Notes (Signed)
Subjective:    Patient ID: [name], female    DOB: March 15, 1933, 78 y.o.   MRN: [id number] HPI 50 former smoker, dx with COPD about 1999, also hx breast CA, CVA with R frontal AVM, colonic AVM's. Also diastolic dysfxn followed Dr Gala Romney. Has been treated with Spiriva + Advair, since 2003. Now on Symbicort  September 21, 2010--Presents for an acute office visit. Complains of wheezing, increased SOB, dry cough x2-3days. Complains of last 4-5 months has been on abx and steroids each month. As soon as she finishes her course symptoms begin to come back with cough, wheezing and dyspnea. Last 2 days, dry cough w/ wheezing. No discolored mucus. Denies chest pain, orthopnea, hemoptysis, fever, n/v/d, edema, headache.   10/30/10 -- follow up severe COPD. Flare last visit, treated with pred and abx, changed Advair to Symbicort to see if this helped with UA irritation and cough. We left her on Pred 10mg  by mouth once daily with plans to f/u and decide whether to stay on chronically.   02/14/11 Acute OV  Pt presents for a work in visit. Complains of increased SOB w/ exertion and sats into 70s w/ exertion, HR varying from the upper 40s to the 120s. Pt complains that over last couple of weeks she feels tired , low energy and more short of breath with walking. She has marathon helios that she has noticed she desats with walking into the 70s. She uses the demand setting. On the continuous flow she does not desat. Today in the office no desaturation with walking on 3 l/m continuous flow. HR ranges 100-120 with walking and at rest. On her portable oximeter her HR avg 40-120 , in the office HR maintained ~106-120 with walking . EKG w/ no acute changes w/ HR ~90 at rest. Does feel tight in chest with walking. Has some occasional wheezing. No discolored mucus or fever. Ankles have been puffy for last several days.  Recently changed from her bisoprolol to cardizem by cardiology.  ROV 03/05/11 -- severe COPD w hypoxemia. As  above, recently seen for DOE and labile HR. Since last time was admitted for anemia, Hgb 6.0. In after math feels her breathing and fatigue are improved. Returns today for f/u. She had to have AVM's cauterized. Has been on chronic pred for last 6 months, currently on 5mg  daily. Her goal is to taper to zero. Currently on Symbicort, Spiriva, uses SABA prn not every day.   ROV 04/03/11 -- severe COPD. Last time we decreased Pred to 3mg  qd. Since our visit she was readmitted for anemia, she is being followed by GI (Dr Mechele Collin). She has had endoscopy, CSY, capsule endoscopy. She feels washed out from the anemia. No real breathing change, wheezing, coughing.   ROV 06/04/11 - written note. Decreased Pred to 1mg  qd.  ROV 07/29/11 -- severe COPD with hypoxemia, tapering steroids. Last time we decreased to 1mg  daily.  She presents with worsening dyspnea, wheeze. No real cough. Her SABA use has increased over the last week. She is less mobile in the home, needed wheelchair.  >>admitted   [facility]/2011 [facility]  Admitted 11/12-11/15 for AECOPD , tx w/ IV abx , steroids and nebs. CXR without acute process.  Discharged on zpack and steroid taper. She is feeling better with decreased cough and dyspnea  Continued on Spiriva and Symbicort .  Currently on prednisone 10mg  daily  No chest pain , discolored mucus or fever   ROV 09/02/11 -- Severe COPD, has been tapering chronic  pred. Hosp for AE in early Nov '12. Prednisone is down to 5mg  now. She has experienced B LE edema over the last month (? Can due to pred).   ROV 10/03/11 -- Severe COPD, has been tapering chronic pred. Last time we decreased pred from 5mg  to 3mg  daily. She tells me that she is still having problems w anemia (has had colonic AVM cauterized before). Hx of diastolic dysfxn, ? R heart failure. She is planning to see cards today, may need diuretics adjusted.   ROV 12/03/11 -- Severe COPD, diastolic dysfxn, tapering chronic pred currently on 3mg . She  is on Zyrtec, has been having some red eyes. Wearing O2 at night and prn. On spiriva + symbicort + SABA, averages once every few weeks.   ROV 01/28/12 -- Severe COPD, diastolic dysfxn, tapering chronic pred, last time decreased to 2mg  qd. She is a bit less active over the last year. Having good days and bad. She uses her O2 prn and at night. She has not flared.   02/24/2012 Acute OV  Complains of productive cough,sob ,wheezing for 4 days.  Barky cough , worse for last few days Currently on prednisone 2mg   No hemotpysis or increased edema. No fever.   ROV 03/06/12 -- Severe COPD, diastolic dysfxn, tag pering chronic pred, last time decreased to 2mg  qd.  She was recently  Caught URI, was seen and treated as above. Now tapered back down to 2mg  Pred qd.  She continues to have cough and fatigue. The cough is non-prod. She is still having nasal gtt and throat congestion.   03/13/2012 Follow up  Last visit with COPD flare , tx Levaquin and Prednisone taper  Returns today feeling better. Has few days left of prednisone. Last day of Levaquin.  CXR last ov w/ no acute finding.  Cough and wheezing are decreased but not gone.  Mucus is clear only. No fever.  Currently on prednisone 20mg  daily  No hemoptysis or edema.    Objective:  Physical Exam Filed Vitals:   03/13/12 0926  BP: 112/70  Pulse: 87  Temp: 97.1 F (36.2 C)   GEN: A/Ox3; pleasant , NAD, elderly female   HEENT:  [place]/AT,  EACs-clear, TMs-wnl, NOSE-clear, THROAT-clear, no lesions, no postnasal drip or exudate noted.   NECK:  Supple w/ fair ROM; no JVD; normal carotid impulses w/o bruits; no thyromegaly or nodules palpated; no lymphadenopathy.  RESP  Coarse BS   CARD:  RRR, Gr 1/6 SM  Tr + peripheral edema, pulses intact, no cyanosis or clubbing.  Musco: Warm bil, no deformities or joint swelling noted.   Neuro: alert, no focal deficits noted.    Skin: Warm, no lesions or rashes   Assessment & Plan:   No problem-specific  assessment & plan notes found for this encounter.

## 2012-03-26 ENCOUNTER — Ambulatory Visit: Payer: Self-pay | Admitting: Family Medicine

## 2012-04-22 ENCOUNTER — Telehealth: Payer: Self-pay | Admitting: Emergency Medicine

## 2012-04-22 NOTE — Telephone Encounter (Signed)
As we have paged RB twice with no response, will send to doc of the day per protocol.  Dr. Shelle Iron, pls advise.  Thank you.

## 2012-04-22 NOTE — Telephone Encounter (Signed)
In absence wheeze, cough this is concerning for other etiology such as angina. She can see me tomorrow, but may need alternative w/u including ECG (for start). If she has more symptoms, more CP before tomorrow then I think she should be seen urgently - go to ED

## 2012-04-22 NOTE — Telephone Encounter (Addendum)
Called, spoke with Melissa who states she visited pt yesterday.  During visit, pt has a new c/o chest pain x 1 - 2 wks.  Melissa states later pt then described this as pressure.  She is unsure if this pain radiates or pt has n/v associated with it.  Also reports pt does have increased SOB with very little activity like talking or brushing teeth for at least 1 wk.  Melissa states pt does use albuterol and receives only temporary relief with the SOB.  Melissa does not believe pt has any wheezing, cough, or f/c/s.  Pt does have a pending OV with RB on Friday, Aug 9.  Melissa concerned with pt's new c/o chest pain (?ing if this could be a copd exacerbation vs something else) and would like to seen prior to Friday if possible or any further recs we may have.  There are no openings in office today or tomorrow at this time.  Advised I would send msg to RB and we would call her back today with further recs.  RB, pls advise.  Thank you.

## 2012-04-22 NOTE — Telephone Encounter (Signed)
I spoke with niece and is aware of this. Pt is scheduled to see RB tomorrow 04/23/12 at 4:00. She is aware if she worsens then needed to seek emergency care. She voiced her understanding and needed nothing further

## 2012-04-23 ENCOUNTER — Ambulatory Visit (INDEPENDENT_AMBULATORY_CARE_PROVIDER_SITE_OTHER): Payer: Medicare Other | Admitting: Emergency Medicine

## 2012-04-23 ENCOUNTER — Encounter: Payer: Self-pay | Admitting: Emergency Medicine

## 2012-04-23 ENCOUNTER — Inpatient Hospital Stay (HOSPITAL_COMMUNITY): Payer: Medicare Other

## 2012-04-23 ENCOUNTER — Encounter (HOSPITAL_COMMUNITY): Payer: Self-pay | Admitting: Emergency Medicine

## 2012-04-23 ENCOUNTER — Inpatient Hospital Stay (HOSPITAL_COMMUNITY)
Admission: AD | Admit: 2012-04-23 | Discharge: 2012-04-28 | DRG: 311 | Disposition: A | Discharge status: Home / Self Care | Payer: Medicare Other | Source: Ambulatory Visit | Attending: Emergency Medicine | Admitting: Emergency Medicine

## 2012-04-23 VITALS — BP 102/60 | HR 102 | Temp 98.2°F | Ht 66.5 in | Wt 167.2 lb

## 2012-04-23 DIAGNOSIS — J438 Other emphysema: Secondary | ICD-10-CM

## 2012-04-23 DIAGNOSIS — J4489 Other specified chronic obstructive pulmonary disease: Secondary | ICD-10-CM

## 2012-04-23 DIAGNOSIS — J449 Chronic obstructive pulmonary disease, unspecified: Secondary | ICD-10-CM

## 2012-04-23 DIAGNOSIS — I2089 Other forms of angina pectoris: Secondary | ICD-10-CM

## 2012-04-23 DIAGNOSIS — K5521 Angiodysplasia of colon with hemorrhage: Secondary | ICD-10-CM

## 2012-04-23 DIAGNOSIS — I2489 Other forms of acute ischemic heart disease: Principal | ICD-10-CM | POA: Diagnosis present

## 2012-04-23 DIAGNOSIS — Z853 Personal history of malignant neoplasm of breast: Secondary | ICD-10-CM

## 2012-04-23 DIAGNOSIS — J309 Allergic rhinitis, unspecified: Secondary | ICD-10-CM

## 2012-04-23 DIAGNOSIS — I2789 Other specified pulmonary heart diseases: Secondary | ICD-10-CM

## 2012-04-23 DIAGNOSIS — R0602 Shortness of breath: Secondary | ICD-10-CM

## 2012-04-23 DIAGNOSIS — I248 Other forms of acute ischemic heart disease: Principal | ICD-10-CM | POA: Diagnosis present

## 2012-04-23 DIAGNOSIS — Z8673 Personal history of transient ischemic attack (TIA), and cerebral infarction without residual deficits: Secondary | ICD-10-CM

## 2012-04-23 DIAGNOSIS — Z87891 Personal history of nicotine dependence: Secondary | ICD-10-CM

## 2012-04-23 DIAGNOSIS — R9389 Abnormal findings on diagnostic imaging of other specified body structures: Secondary | ICD-10-CM

## 2012-04-23 DIAGNOSIS — R9431 Abnormal electrocardiogram [ECG] [EKG]: Secondary | ICD-10-CM

## 2012-04-23 DIAGNOSIS — K219 Gastro-esophageal reflux disease without esophagitis: Secondary | ICD-10-CM

## 2012-04-23 DIAGNOSIS — J45909 Unspecified asthma, uncomplicated: Secondary | ICD-10-CM

## 2012-04-23 DIAGNOSIS — R079 Chest pain, unspecified: Secondary | ICD-10-CM

## 2012-04-23 DIAGNOSIS — Z7982 Long term (current) use of aspirin: Secondary | ICD-10-CM

## 2012-04-23 DIAGNOSIS — I208 Other forms of angina pectoris: Secondary | ICD-10-CM | POA: Diagnosis present

## 2012-04-23 DIAGNOSIS — I10 Essential (primary) hypertension: Secondary | ICD-10-CM

## 2012-04-23 DIAGNOSIS — Z9981 Dependence on supplemental oxygen: Secondary | ICD-10-CM

## 2012-04-23 DIAGNOSIS — I209 Angina pectoris, unspecified: Secondary | ICD-10-CM

## 2012-04-23 DIAGNOSIS — I739 Peripheral vascular disease, unspecified: Secondary | ICD-10-CM

## 2012-04-23 DIAGNOSIS — I2510 Atherosclerotic heart disease of native coronary artery without angina pectoris: Secondary | ICD-10-CM

## 2012-04-23 DIAGNOSIS — I6529 Occlusion and stenosis of unspecified carotid artery: Secondary | ICD-10-CM

## 2012-04-23 DIAGNOSIS — R609 Edema, unspecified: Secondary | ICD-10-CM

## 2012-04-23 DIAGNOSIS — E039 Hypothyroidism, unspecified: Secondary | ICD-10-CM

## 2012-04-23 DIAGNOSIS — I359 Nonrheumatic aortic valve disorder, unspecified: Secondary | ICD-10-CM | POA: Diagnosis present

## 2012-04-23 DIAGNOSIS — R195 Other fecal abnormalities: Secondary | ICD-10-CM

## 2012-04-23 DIAGNOSIS — D509 Iron deficiency anemia, unspecified: Secondary | ICD-10-CM

## 2012-04-23 DIAGNOSIS — R0600 Dyspnea, unspecified: Secondary | ICD-10-CM

## 2012-04-23 DIAGNOSIS — D649 Anemia, unspecified: Secondary | ICD-10-CM

## 2012-04-23 HISTORY — DX: Dependence on supplemental oxygen: Z99.81

## 2012-04-23 HISTORY — DX: Angina pectoris, unspecified: I20.9

## 2012-04-23 HISTORY — DX: Emphysema, unspecified: J43.9

## 2012-04-23 HISTORY — DX: Anemia, unspecified: D64.9

## 2012-04-23 HISTORY — DX: Dyspnea, unspecified: R06.00

## 2012-04-23 HISTORY — DX: Cardiac murmur, unspecified: R01.1

## 2012-04-23 HISTORY — DX: Personal history of other medical treatment: Z92.89

## 2012-04-23 HISTORY — DX: Personal history of other diseases of the digestive system: Z87.19

## 2012-04-23 HISTORY — DX: Unspecified osteoarthritis, unspecified site: M19.90

## 2012-04-23 LAB — CBC
MCH: 20.9 pg — ABNORMAL LOW (ref 26.0–34.0)
MCHC: 28.3 g/dL — ABNORMAL LOW (ref 30.0–36.0)
RDW: 15.7 % — ABNORMAL HIGH (ref 11.5–15.5)

## 2012-04-23 LAB — PROTIME-INR
INR: 0.92 (ref 0.00–1.49)
Prothrombin Time: 12.6 seconds (ref 11.6–15.2)

## 2012-04-23 LAB — BASIC METABOLIC PANEL
Calcium: 8.8 mg/dL (ref 8.4–10.5)
Creatinine, Ser: 0.52 mg/dL (ref 0.50–1.10)
GFR calc non Af Amer: 90 mL/min — ABNORMAL LOW (ref >90)
Sodium: 140 mEq/L (ref 135–145)

## 2012-04-23 LAB — PHOSPHORUS: Phosphorus: 3.8 mg/dL (ref 2.3–4.6)

## 2012-04-23 LAB — CARDIAC PANEL(CRET KIN+CKTOT+MB+TROPI): Troponin I: <0.3 ng/mL (ref <0.30)

## 2012-04-23 LAB — MAGNESIUM: Magnesium: 2.4 mg/dL (ref 1.5–2.5)

## 2012-04-23 LAB — PRO B NATRIURETIC PEPTIDE: Pro B Natriuretic peptide (BNP): 425.6 pg/mL (ref 0–450)

## 2012-04-23 MED ORDER — ASPIRIN EC 325 MG PO TBEC
325.0000 mg | DELAYED_RELEASE_TABLET | Freq: Every day | ORAL | Status: DC
  Administered 2012-04-24 – 2012-04-26 (×3): 325 mg via ORAL
  Filled 2012-04-23 (×4): qty 1

## 2012-04-23 MED ORDER — LEVOTHYROXINE SODIUM 50 MCG PO TABS
50.0000 ug | ORAL_TABLET | Freq: Every day | ORAL | Status: DC
  Administered 2012-04-24 – 2012-04-28 (×5): 50 ug via ORAL
  Filled 2012-04-23 (×5): qty 1

## 2012-04-23 MED ORDER — PREDNISONE 5 MG PO TABS
5.0000 mg | ORAL_TABLET | Freq: Every day | ORAL | Status: DC
  Administered 2012-04-24 – 2012-04-28 (×5): 5 mg via ORAL
  Filled 2012-04-23 (×5): qty 1

## 2012-04-23 MED ORDER — ZOLPIDEM TARTRATE 5 MG PO TABS
5.0000 mg | ORAL_TABLET | Freq: Every evening | ORAL | Status: DC | PRN
  Administered 2012-04-23 – 2012-04-27 (×5): 5 mg via ORAL
  Filled 2012-04-23 (×5): qty 1

## 2012-04-23 MED ORDER — ONE-A-DAY EXTRAS ANTIOXIDANT PO CAPS: 1.0000 | ORAL_CAPSULE | Freq: Every day | ORAL | Status: DC

## 2012-04-23 MED ORDER — ALBUTEROL SULFATE HFA 108 (90 BASE) MCG/ACT IN AERS
2.0000 | INHALATION_SPRAY | RESPIRATORY_TRACT | Status: DC | PRN
  Filled 2012-04-23: qty 6.7

## 2012-04-23 MED ORDER — SODIUM CHLORIDE 0.9 % IJ SOLN
3.0000 mL | Freq: Two times a day (BID) | INTRAMUSCULAR | Status: DC
  Administered 2012-04-26 – 2012-04-28 (×3): 3 mL via INTRAVENOUS

## 2012-04-23 MED ORDER — ATORVASTATIN CALCIUM 20 MG PO TABS
20.0000 mg | ORAL_TABLET | Freq: Every day | ORAL | Status: DC
  Administered 2012-04-24 – 2012-04-28 (×5): 20 mg via ORAL
  Filled 2012-04-23 (×5): qty 1

## 2012-04-23 MED ORDER — FUROSEMIDE 40 MG PO TABS
40.0000 mg | ORAL_TABLET | Freq: Two times a day (BID) | ORAL | Status: DC
  Filled 2012-04-23 (×3): qty 1

## 2012-04-23 MED ORDER — SODIUM CHLORIDE 0.9 % IJ SOLN: 3.0000 mL | INTRAMUSCULAR | Status: DC | PRN

## 2012-04-23 MED ORDER — LORATADINE 10 MG PO TABS
10.0000 mg | ORAL_TABLET | Freq: Every day | ORAL | Status: DC
  Administered 2012-04-24 – 2012-04-28 (×5): 10 mg via ORAL
  Filled 2012-04-23 (×5): qty 1

## 2012-04-23 MED ORDER — DILTIAZEM HCL ER COATED BEADS 120 MG PO CP24
120.0000 mg | ORAL_CAPSULE | Freq: Every day | ORAL | Status: DC
  Administered 2012-04-24 – 2012-04-28 (×5): 120 mg via ORAL
  Filled 2012-04-23 (×5): qty 1

## 2012-04-23 MED ORDER — ADULT MULTIVITAMIN W/MINERALS CH
1.0000 | ORAL_TABLET | Freq: Every day | ORAL | Status: DC
  Administered 2012-04-24 – 2012-04-28 (×5): 1 via ORAL
  Filled 2012-04-23 (×5): qty 1

## 2012-04-23 MED ORDER — TIOTROPIUM BROMIDE MONOHYDRATE 18 MCG IN CAPS
18.0000 ug | ORAL_CAPSULE | Freq: Every day | RESPIRATORY_TRACT | Status: DC
  Administered 2012-04-24 – 2012-04-28 (×4): 18 ug via RESPIRATORY_TRACT
  Filled 2012-04-23: qty 5

## 2012-04-23 MED ORDER — PANTOPRAZOLE SODIUM 40 MG PO TBEC
40.0000 mg | DELAYED_RELEASE_TABLET | Freq: Two times a day (BID) | ORAL | Status: DC
  Administered 2012-04-24 – 2012-04-28 (×9): 40 mg via ORAL
  Filled 2012-04-23 (×9): qty 1

## 2012-04-23 MED ORDER — LEVOTHYROXINE SODIUM 112 MCG PO TABS
112.0000 ug | ORAL_TABLET | Freq: Every day | ORAL | Status: DC
  Administered 2012-04-24 – 2012-04-28 (×5): 112 ug via ORAL
  Filled 2012-04-23 (×5): qty 1

## 2012-04-23 MED ORDER — BUDESONIDE-FORMOTEROL FUMARATE 160-4.5 MCG/ACT IN AERO
2.0000 | INHALATION_SPRAY | Freq: Two times a day (BID) | RESPIRATORY_TRACT | Status: DC
  Administered 2012-04-23 – 2012-04-28 (×9): 2 via RESPIRATORY_TRACT
  Filled 2012-04-23: qty 6

## 2012-04-23 MED ORDER — SODIUM CHLORIDE 0.9 % IV SOLN
250.0000 mL | INTRAVENOUS | Status: DC | PRN
  Administered 2012-04-24: 250 mL via INTRAVENOUS

## 2012-04-23 MED ORDER — SODIUM CHLORIDE 0.9 % IJ SOLN
3.0000 mL | Freq: Two times a day (BID) | INTRAMUSCULAR | Status: DC
  Administered 2012-04-24 – 2012-04-28 (×9): 3 mL via INTRAVENOUS

## 2012-04-23 MED ORDER — FESOTERODINE FUMARATE ER 4 MG PO TB24
4.0000 mg | ORAL_TABLET | Freq: Every day | ORAL | Status: DC
  Administered 2012-04-24 – 2012-04-28 (×5): 4 mg via ORAL
  Filled 2012-04-23 (×5): qty 1

## 2012-04-23 NOTE — Significant Event (Signed)
Pt c/o difficulty sleeping.  Has used ambien as outpt.  Will order ambien 5 mg qhs prn.  Coralyn Helling, MD 04/23/2012, 9:39 PM

## 2012-04-23 NOTE — Assessment & Plan Note (Signed)
Continue PPI ?

## 2012-04-23 NOTE — Assessment & Plan Note (Signed)
-  continue synthroid 

## 2012-04-23 NOTE — Significant Event (Signed)
Admitted with dyspnea.  Has hx of COPD and CAD.  Has low Hb.  Will proceed with transfusion PRBC.  Lab Results  Component Value Date   WBC 10.5 04/23/2012   HGB 6.2* 04/23/2012   HCT 21.9* 04/23/2012   MCV 74.0* 04/23/2012   PLT 271 04/23/2012   Coralyn Helling, MD 04/23/2012, 9:17 PM

## 2012-04-23 NOTE — Progress Notes (Signed)
Subjective:    Patient ID: [name], female    DOB: Jul 01, 1933, 78 y.o.   MRN: [id number] HPI 36 former smoker, dx with COPD about 1999, also hx breast CA, CVA with R frontal AVM, colonic AVM's. Also diastolic dysfxn followed Dr Gala Romney. Has been treated with Spiriva + Advair, since 2003. Now on Symbicort  September 21, 2010--Presents for an acute office visit. Complains of wheezing, increased SOB, dry cough x2-3days. Complains of last 4-5 months has been on abx and steroids each month. As soon as she finishes her course symptoms begin to come back with cough, wheezing and dyspnea. Last 2 days, dry cough w/ wheezing. No discolored mucus. Denies chest pain, orthopnea, hemoptysis, fever, n/v/d, edema, headache.   10/30/10 -- follow up severe COPD. Flare last visit, treated with pred and abx, changed Advair to Symbicort to see if this helped with UA irritation and cough. We left her on Pred 10mg  by mouth once daily with plans to f/u and decide whether to stay on chronically.   02/14/11 Acute OV  Pt presents for a work in visit. Complains of increased SOB w/ exertion and sats into 70s w/ exertion, HR varying from the upper 40s to the 120s. Pt complains that over last couple of weeks she feels tired , low energy and more short of breath with walking. She has marathon helios that she has noticed she desats with walking into the 70s. She uses the demand setting. On the continuous flow she does not desat. Today in the office no desaturation with walking on 3 l/m continuous flow. HR ranges 100-120 with walking and at rest. On her portable oximeter her HR avg 40-120 , in the office HR maintained ~106-120 with walking . EKG w/ no acute changes w/ HR ~90 at rest. Does feel tight in chest with walking. Has some occasional wheezing. No discolored mucus or fever. Ankles have been puffy for last several days.  Recently changed from her bisoprolol to cardizem by cardiology.  ROV 03/05/11 -- severe COPD w hypoxemia. As  above, recently seen for DOE and labile HR. Since last time was admitted for anemia, Hgb 6.0. In after math feels her breathing and fatigue are improved. Returns today for f/u. She had to have AVM's cauterized. Has been on chronic pred for last 6 months, currently on 5mg  daily. Her goal is to taper to zero. Currently on Symbicort, Spiriva, uses SABA prn not every day.   ROV 04/03/11 -- severe COPD. Last time we decreased Pred to 3mg  qd. Since our visit she was readmitted for anemia, she is being followed by GI (Dr Mechele Collin). She has had endoscopy, CSY, capsule endoscopy. She feels washed out from the anemia. No real breathing change, wheezing, coughing.   ROV 06/04/11 - written note. Decreased Pred to 1mg  qd.  ROV 07/29/11 -- severe COPD with hypoxemia, tapering steroids. Last time we decreased to 1mg  daily.  She presents with worsening dyspnea, wheeze. No real cough. Her SABA use has increased over the last week. She is less mobile in the home, needed wheelchair.  >>admitted   [facility]  Admitted 11/12-11/15 for AECOPD , tx w/ IV abx , steroids and nebs. CXR without acute process.  Discharged on zpack and steroid taper. She is feeling better with decreased cough and dyspnea  Continued on Spiriva and Symbicort .  Currently on prednisone 10mg  daily  No chest pain , discolored mucus or fever   ROV 09/02/11 -- Severe COPD, has been tapering chronic  pred. Hosp for AE in early Nov '12. Prednisone is down to 5mg  now. She has experienced B LE edema over the last month (? Can due to pred).   ROV 10/03/11 -- Severe COPD, has been tapering chronic pred. Last time we decreased pred from 5mg  to 3mg  daily. She tells me that she is still having problems w anemia (has had colonic AVM cauterized before). Hx of diastolic dysfxn, ? R heart failure. She is planning to see cards today, may need diuretics adjusted.   ROV 12/03/11 -- Severe COPD, diastolic dysfxn, tapering chronic pred currently on 3mg . She  is on Zyrtec, has been having some red eyes. Wearing O2 at night and prn. On spiriva + symbicort + SABA, averages once every few weeks.   ROV 01/28/12 -- Severe COPD, diastolic dysfxn, tapering chronic pred, last time decreased to 2mg  qd. She is a bit less active over the last year. Having good days and bad. She uses her O2 prn and at night. She has not flared.   02/24/2012 Acute OV  Complains of productive cough,sob ,wheezing for 4 days.  Barky cough , worse for last few days Currently on prednisone 2mg   No hemotpysis or increased edema. No fever.   ROV 03/06/12 -- Severe COPD, diastolic dysfxn, tag pering chronic pred, last time decreased to 2mg  qd.  She was recently  Caught URI, was seen and treated as above. Now tapered back down to 2mg  Pred qd.  She continues to have cough and fatigue. The cough is non-prod. She is still having nasal gtt and throat congestion.   Follow up 03/13/12 -  Last visit with COPD flare , tx Levaquin and Prednisone taper  Returns today feeling better. Has few days left of prednisone. Last day of Levaquin.  CXR last ov w/ no acute finding.  Cough and wheezing are decreased but not gone.  Mucus is clear only. No fever.  Currently on prednisone 20mg  daily  No hemoptysis or edema.   ROV 04/23/12 -- Severe COPD, HTN + diastolic dysfxn, hypoxemia. Has been on chronic pred, recent AE as above. Tapered down to Pred 5mg  since another burst 6/28. She presents today with worsening SOB, chest pain especially with exertion or talking. This has been happening for about 1.5 week. She also has more LE edema, more wheezing. She has increased her ProAir use but it hasn't helped very much. Taking protonix qd.    Objective:  Physical Exam Filed Vitals:   04/23/12 1607  BP: 102/60  Pulse: 102  Temp: 98.2 F (36.8 C)   GEN: A/Ox3; pleasant , NAD, elderly female   HEENT:  [place]/AT,  EACs-clear, TMs-wnl, NOSE-clear, THROAT-clear, no lesions, no postnasal drip or exudate noted.    NECK:  Supple w/ fair ROM; no JVD; normal carotid impulses w/o bruits; no thyromegaly or nodules palpated; no lymphadenopathy.  RESP  Coarse BS at bases, no wheeze, good air movement  CARD:  RRR, Gr 1/6 SM, 1+ ankle edema, pulses intact, no cyanosis or clubbing.  Musco: Warm bil, no deformities or joint swelling noted.   Neuro: alert, no focal deficits noted.    Skin: Warm, no lesions or rashes   Assessment & Plan:   COPD Appears to be stable at this time, without evidence acute exacerbation. I do not believe her current sx are related to the COPD - continue pred 5mg  qd - continue O2 + Spiriva + Symbicort  HYPOTHYROIDISM -continue synthroid  HYPERTENSION - continue home medications - will increase lasix  pending eval by cardiology given her new LE edema  CAD, NATIVE VESSEL Presenting complaints very concerning for unstable angina. Reviewed the case with Dr Gala Romney who agrees that she should be admitted to better evaluate. She will probably undergo L and R heart cath.  - ECG on arrival - ASA - NPO after MN - Dr Gala Romney to eval after admission to [facility]  GERD (gastroesophageal reflux disease) -Continue PPI

## 2012-04-23 NOTE — Assessment & Plan Note (Signed)
Appears to be stable at this time, without evidence acute exacerbation. I do not believe her current sx are related to the COPD - continue pred 5mg  qd - continue O2 + Spiriva + Symbicort

## 2012-04-23 NOTE — Assessment & Plan Note (Signed)
-   continue home medications - will increase lasix pending eval by cardiology given her new LE edema

## 2012-04-23 NOTE — H&P (Signed)
Patient ID: [name], female    DOB: 1933/09/09, 78 y.o.   MRN: [id number]  CC: Dyspnea and Chest Pain  HPI 43 former smoker, dx with COPD about 1999, also hx breast CA, CVA with R frontal AVM, colonic AVM's that have ben associated in the past with occult anemia and require cautery. Also diastolic dysfxn and single vessel CAD followed Dr Gala Romney. Has been treated with Spiriva + Symbicort for her COPD. She has been on chronic prednisone which we have ben tapering over several months, currently on 5mg  qd after recent acute exacerbation in June 2013.   She presents today with worsening SOB, chest pain especially with exertion or talking. This has been happening for about 1.5 week. She also has more LE edema, more wheezing. She has increased her ProAir use but it hasn't helped very much. Taking protonix qd, denies active reflux.   ROS: Per the HPI, other systems reviewed and negative   Past Medical History  Diagnosis Date  . COPD (chronic obstructive pulmonary disease)   . Hypertrophic obstructive cardiomyopathy     Echo 10/10 EF 65-70% SW 1.6 PW 1.4 mild SAM no LVOT gradient at rest. Valsalva not performed Mild MR. No hyperenhance ment on MRI EF 69%.  . Coronary atherosclerosis of native coronary artery     Cath 1/11: RCA 50-60 ostial o/w normal. EF 70%.   . Pulmonary hypertension     Cath 1/11 RA 8, RV 43/6/14, PA 42/17 (29) PCWP 15 Fick  5.0/2.6. PVR 2.8    . Edema   . Breast cancer   . Occlusion and stenosis of carotid artery     s/p L CEA u/s 3/11. R 60-79%; L 40-59%. no change since 3/10  . Peripheral vascular disease, unspecified   . Hypertension   . Dyspnea   . Emphysema   . Asthma   . Hypothyroidism   . Stroke   . Allergic rhinitis   . Gastric AVM      Family History  Problem Relation Age of Onset  . Emphysema Father   . COPD Sister   . Hypertension Sister     Pulmonary  . Emphysema Sister   . Asthma Sister   . Asthma Sister   . Emphysema Brother     x2  .  Colon cancer Brother   . Colon cancer Mother   . Stroke Mother      History   Social History  . Marital Status: Single    Spouse Name: N/A    Number of Children: N/A  . Years of Education: N/A   Occupational History  . 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 Smoking status: Former Smoker -- 1.0 packs/day for 30 years    Types: Cigarettes    Quit date: 09/16/1997  . Smokeless tobacco: Never Used  . Alcohol Use: No  . Drug Use: No  . Sexually Active: No   Other Topics Concern  . Not on file   Social History Narrative   Single, no childrenLives with 2 sistersDoes not get regular exercise     Allergies  Allergen Reactions  . Latex       No current facility-administered medications on file prior to encounter.   Current Outpatient Prescriptions on File Prior to Encounter  Medication Sig Dispense Refill  . albuterol (PROVENTIL HFA;VENTOLIN HFA) 108 (90 BASE) MCG/ACT inhaler Inhale 2 puffs into the lungs every 6 (six) hours as  needed.  1 Inhaler  11  . albuterol (PROVENTIL) (2.5 MG/3ML) 0.083% nebulizer solution Take 3 mLs (2.5 mg total) by nebulization every 6 (six) hours as needed for wheezing.  75 mL  12  . aspirin 81 MG EC tablet Take 81 mg by mouth daily.        Marland Kitchen atorvastatin (LIPITOR) 20 MG tablet Take 20 mg by mouth daily.        . budesonide-formoterol (SYMBICORT) 160-4.5 MCG/ACT inhaler Inhale 2 puffs into the lungs 2 (two) times daily.  1 Inhaler  11  . Calcium Carbonate (CALCIUM 600) 1500 MG TABS Take 1 tablet by mouth daily.        . cetirizine (ZYRTEC) 10 MG tablet Take 10 mg by mouth daily.      . Cholecalciferol (VITAMIN D-3 PO) Take 1 tablet by mouth daily.      Marland Kitchen DETROL LA 2 MG 24 hr capsule Once daily      . diltiazem (CARDIZEM CD) 120 MG 24 hr capsule Take 1 capsule (120 mg total) by mouth daily.  30 capsule  6  . furosemide (LASIX) 20 MG tablet Take 20 mg by mouth daily.        Marland Kitchen  HYDROcodone-acetaminophen (VICODIN) 5-500 MG per tablet 1 by mouth every 4-6 hours as needed      . levothyroxine (SYNTHROID, LEVOTHROID) 112 MCG tablet Take 112 mcg by mouth daily.       Marland Kitchen levothyroxine (SYNTHROID, LEVOTHROID) 50 MCG tablet Take 50 mcg by mouth daily.        . Multiple Vitamins-Minerals (ONE-A-DAY EXTRAS ANTIOXIDANT) CAPS Take 1 capsule by mouth daily.        . pantoprazole (PROTONIX) 40 MG tablet Take 1 tablet (40 mg total) by mouth 2 (two) times daily.  60 tablet  5  . predniSONE (DELTASONE) 1 MG tablet Take 5 mg by mouth daily. Take as directed      . tiotropium (SPIRIVA) 18 MCG inhalation capsule Place 1 capsule (18 mcg total) into inhaler and inhale daily.  30 capsule  5   Objective: Physical Exam Filed Vitals:  04/23/12 1607 BP: 102/60 Pulse: 102 Temp: 98.2 F (36.8 C)  GEN: A/Ox3; pleasant , NAD, elderly female   HEENT:  [place]/AT,  EACs-clear, TMs-wnl, NOSE-clear, THROAT-clear, no lesions, no postnasal drip or exudate noted.   NECK:  Supple w/ fair ROM; no JVD; normal carotid impulses w/o bruits; no thyromegaly or nodules palpated; no lymphadenopathy.  RESP  Coarse BS at bases, no wheeze, good air movement  CARD:  RRR, Gr 1/6 SM, 1+ ankle edema, pulses intact, no cyanosis or clubbing.  Musco: Warm bil, no deformities or joint swelling noted.   Neuro: alert, no focal deficits noted.    Skin: Warm, no lesions or rashes   Assessment & Plan: COPD Appears to be stable at this time, without evidence acute exacerbation. I do not believe her current sx are related to the COPD - continue pred 5mg  qd - continue O2 + Spiriva + Symbicort  HYPOTHYROIDISM -continue synthroid  HYPERTENSION - continue home medications - will increase lasix pending eval by cardiology given her new LE edema  CAD, NATIVE VESSEL Presenting complaints very concerning for unstable angina. Reviewed the case with Dr Gala Romney who agrees that she should be admitted to better evaluate. She  will probably undergo L and R heart cath.  - ECG on arrival - ASA - NPO after MN - Dr Gala Romney to eval after admission to [facility]  GERD (gastroesophageal reflux disease) -Continue PPI  HX OF AVM'S AND ANEMIA Could have recurrent anemia precipitating angina, dyspnea -CBC on arrival [facility]   Levy Pupa, MD, PhD 04/23/2012, 5:33 PM [facility] [phone] or if no answer [phone]

## 2012-04-23 NOTE — Assessment & Plan Note (Addendum)
Presenting complaints very concerning for unstable angina. Reviewed the case with Dr Gala Romney who agrees that she should be admitted to better evaluate. She will probably undergo L and R heart cath.  - ECG on arrival - ASA - NPO after MN - Dr Gala Romney to eval after admission to [facility]

## 2012-04-24 ENCOUNTER — Ambulatory Visit: Payer: Medicare Other | Admitting: Emergency Medicine

## 2012-04-24 DIAGNOSIS — J449 Chronic obstructive pulmonary disease, unspecified: Secondary | ICD-10-CM

## 2012-04-24 DIAGNOSIS — D649 Anemia, unspecified: Secondary | ICD-10-CM

## 2012-04-24 DIAGNOSIS — I2510 Atherosclerotic heart disease of native coronary artery without angina pectoris: Secondary | ICD-10-CM | POA: Diagnosis present

## 2012-04-24 DIAGNOSIS — D509 Iron deficiency anemia, unspecified: Secondary | ICD-10-CM | POA: Diagnosis present

## 2012-04-24 DIAGNOSIS — K5521 Angiodysplasia of colon with hemorrhage: Secondary | ICD-10-CM

## 2012-04-24 DIAGNOSIS — R079 Chest pain, unspecified: Secondary | ICD-10-CM

## 2012-04-24 DIAGNOSIS — R195 Other fecal abnormalities: Secondary | ICD-10-CM

## 2012-04-24 DIAGNOSIS — K219 Gastro-esophageal reflux disease without esophagitis: Secondary | ICD-10-CM

## 2012-04-24 LAB — BASIC METABOLIC PANEL
BUN: 12 mg/dL (ref 6–23)
Chloride: 105 mEq/L (ref 96–112)
Creatinine, Ser: 0.54 mg/dL (ref 0.50–1.10)
GFR calc Af Amer: >90 mL/min (ref >90)
GFR calc non Af Amer: 89 mL/min — ABNORMAL LOW (ref >90)

## 2012-04-24 LAB — CARDIAC PANEL(CRET KIN+CKTOT+MB+TROPI)
CK, MB: 2.2 ng/mL (ref 0.3–4.0)
CK, MB: 2.1 ng/mL (ref 0.3–4.0)
Total CK: 39 U/L (ref 7–177)
Total CK: 41 U/L (ref 7–177)
Troponin I: <0.3 ng/mL (ref <0.30)
Troponin I: <0.3 ng/mL (ref <0.30)

## 2012-04-24 LAB — CBC
HCT: 23.2 % — ABNORMAL LOW (ref 36.0–46.0)
HCT: 28.7 % — ABNORMAL LOW (ref 36.0–46.0)
Hemoglobin: 8.7 g/dL — ABNORMAL LOW (ref 12.0–15.0)
MCHC: 28.9 g/dL — ABNORMAL LOW (ref 30.0–36.0)
MCV: 75.3 fL — ABNORMAL LOW (ref 78.0–100.0)
Platelets: 222 10*3/uL (ref 150–400)
RDW: 15.7 % — ABNORMAL HIGH (ref 11.5–15.5)
WBC: 9.2 10*3/uL (ref 4.0–10.5)
WBC: 11.1 10*3/uL — ABNORMAL HIGH (ref 4.0–10.5)

## 2012-04-24 LAB — PREPARE RBC (CROSSMATCH)

## 2012-04-24 MED ORDER — SODIUM CHLORIDE 0.9 % IV SOLN
1000.0000 mg | Freq: Once | INTRAVENOUS | Status: DC
  Filled 2012-04-24: qty 20

## 2012-04-24 MED ORDER — FUROSEMIDE 20 MG PO TABS
20.0000 mg | ORAL_TABLET | Freq: Every day | ORAL | Status: DC
  Administered 2012-04-26 – 2012-04-28 (×3): 20 mg via ORAL
  Filled 2012-04-24 (×5): qty 1

## 2012-04-24 MED ORDER — MAGNESIUM HYDROXIDE 400 MG/5ML PO SUSP
30.0000 mL | Freq: Once | ORAL | Status: AC
  Administered 2012-04-24: 30 mL via ORAL
  Filled 2012-04-24: qty 30

## 2012-04-24 MED ORDER — SODIUM CHLORIDE 0.9 % IV SOLN
25.0000 mg | Freq: Once | INTRAVENOUS | Status: AC
  Administered 2012-04-24: 25 mg via INTRAVENOUS
  Filled 2012-04-24: qty 0.5

## 2012-04-24 MED ORDER — BIOTENE DRY MOUTH MT LIQD
15.0000 mL | Freq: Two times a day (BID) | OROMUCOSAL | Status: DC
  Administered 2012-04-25 – 2012-04-28 (×5): 15 mL via OROMUCOSAL

## 2012-04-24 MED ORDER — SODIUM CHLORIDE 0.9 % IV SOLN
1000.0000 mg | Freq: Once | INTRAVENOUS | Status: AC
  Administered 2012-04-24: 1000 mg via INTRAVENOUS
  Filled 2012-04-24 (×2): qty 20

## 2012-04-24 NOTE — Care Management Note (Unsigned)
    Page 1 of 1   04/27/2012     1:46:15 PM   CARE MANAGEMENT NOTE 04/27/2012  Patient:  [name], [name]   Account Number:  [id number]  Date Initiated:  04/24/2012  Documentation initiated by:  SIMMONS,Jais Demir  Subjective/Objective Assessment:   ADMITTED WITH CP RELATED TO ANEMIA; LIVES AT HOME WITH SISTER- LEE- IN [place]; HAS DME- HOME O2 ([employer]), ROLLATOR RW; USES [facility] IN [place].     Action/Plan:   DISCHARGE PLANNING DISCUSSED AT BEDSIDE.   Anticipated DC Date:  04/26/2012   Anticipated DC Plan:  HOME/SELF CARE      DC Planning Services  CM consult      Choice offered to / List presented to:             Status of service:  In process, will continue to follow Medicare Important Message given?   (If response is "NO", the following Medicare IM given date fields will be blank) Date Medicare IM given:   Date Additional Medicare IM given:    Discharge Disposition:    Per UR Regulation:  Reviewed for med. necessity/level of care/duration of stay  If discussed at Long Length of Stay Meetings, dates discussed:    Comments:  04/27/12  1344  Jayleena Stille SIMMONS RN, BSN [phone] NCM CONTACTED [facility] FOR PORTABLE O2 TANK NEEDED FOR DISCHARGE; TO BE DELIVERED TO ROOM TODAY;  NCM WILL FOLLOW.  04/24/12  0901  Valdez Brannan SIMMONS RN, BSN [phone] NCM WILL FOLLOW.

## 2012-04-24 NOTE — Progress Notes (Signed)
Patient ID: Exie Chrismer, female    DOB: [birth year], 78 y.o.   MRN: [id number]  CC: Dyspnea and Chest Pain  HPI 53 former smoker, dx with COPD about 1999, also hx breast CA, CVA with R frontal AVM, colonic AVM's that have ben associated in the past with occult anemia and required cautery. Also diastolic dysfxn and single vessel CAD followed by Dr Gala Romney. Has been treated with Spiriva + Symbicort for her COPD. She has been on chronic prednisone which we have been tapering over several months, currently on 5mg  qd after recent acute exacerbation in June 2013.   Admitted 8/8 with exertional CP and dyspnea.   Subjective: Received 1u PRBC overnight for Hgb 6.2, has not noticed any BRBPR or melena  Objective: Temp:  [98.1 F (36.7 C)-98.8 F (37.1 C)] 98.2 F (36.8 C) (08/09 0647) Pulse Rate:  [75-102] 75  (08/09 0647) Resp:  [14-18] 17  (08/09 0647) BP: (97-133)/(59-72) 97/59 mmHg (08/09 0647) SpO2:  [90 %-99 %] 97 % (08/09 0647) Weight:  [74.4 kg (164 lb 0.4 oz)-75.841 kg (167 lb 3.2 oz)] 74.4 kg (164 lb 0.4 oz) (08/09 3086)  Intake/Output Summary (Last 24 hours) at 04/24/12 0824 Last data filed at 04/24/12 0247  Gross per 24 hour  Intake    365 ml  Output      0 ml  Net    365 ml   GEN: A/Ox3; pleasant , NAD, elderly female   HEENT:  [place]/AT,  EACs-clear, TMs-wnl, NOSE-clear, THROAT-clear, no lesions, no postnasal drip or exudate noted.   NECK:  Supple w/ fair ROM; no JVD; normal carotid impulses w/o bruits; no thyromegaly or nodules palpated; no lymphadenopathy.  RESP  Coarse BS at bases, no wheeze, good air movement  CARD:  RRR, Gr 1/6 SM, 1+ ankle edema, pulses intact, no cyanosis or clubbing.  Musco: Warm bil, no deformities or joint swelling noted.   Neuro: alert, no focal deficits noted.    Skin: Warm, no lesions or rashes   Lab 04/24/12 0300 04/23/12 1955  HGB 6.7* 6.2*  HCT 23.2* 21.9*  WBC 9.2 10.5  PLT 222 271    Lab 04/24/12 0300 04/23/12 1955  NA 146* 140    K 3.5 3.5  CL 105 99  CO2 34* 34*  GLUCOSE 129* 80  BUN 12 14  CREATININE 0.54 0.52  CALCIUM 8.5 8.8  MG -- 2.4  PHOS -- 3.8     Lab 04/24/12 0300 04/23/12 1955  TROPONINI <0.30 <0.30   Portable Chest 1 View  04/23/2012  *RADIOLOGY REPORT*  Clinical Data: Chest pain  PORTABLE CHEST - 1 VIEW  Comparison: 03/06/2012  Findings: Background COPD/emphysema noted.  Stable cardiomegaly without CHF or pneumonia.  No effusion, collapse, consolidation, pneumothorax.  Atherosclerosis of the aorta.  Trachea is midline.  IMPRESSION: Stable COPD pattern.  No superimposed acute process  Original Report Authenticated By: Judie Petit. Ruel Favors, M.D.    Assessment & Plan: COPD Appears to be stable at this time, without evidence acute exacerbation.  - continue pred 5mg  qd - continue O2 + Spiriva + Symbicort  HYPOTHYROIDISM, TSH 0.39 -continue home synthroid  HYPERTENSION - continue home dilt - will increase lasix on admission to 40mg  bid x 3 doses given her edema on admission, then likely back to her home dose 20mg  qd  CAD, NATIVE VESSEL Presenting complaints very concerning for unstable angina, now with evidence that this is demand ischemia pattern due to her anemia.  - ASA increased from 81  to 325mg  - Appreciate Dr Bensimhon's evaluation. If anginal sx continue after anemia treated then probable cath. If CP resolves then would likely be able to perform nuclear stress test.   GERD (gastroesophageal reflux disease) -Continue PPI  HX OF AVM'S AND MICROCYTIC ANEMIA, INR 0.92 Recurrent anemia confirmed, Hgb 6.2 on arrival 8/8 >> 6.7 after 1u PRBC. She has had multiple CSY in past, followed by Dr Mechele Collin in [place], has been cauterized before.  -transfuse PRBC 1u and follow; I would like to get Hgb > 8.0 in presence possible ischemia -consult GI for ? CSY vs bleeding scan, etc.  -iron panel in am   Levy Pupa, MD, PhD 04/24/2012, 8:20 AM [facility] [phone] or if no  answer [phone]

## 2012-04-24 NOTE — Consult Note (Signed)
[facility] Consult Note  Referring Provider: No ref. provider found Primary Care Physician:  Lamona Curl, MD Primary Gastroenterologist:  Dr.   Antony Contras Complaint: Chest pain shortness of breath HPI: [name] is an 78 y.o. white female  admitted with suspected unstable angina found to have a low hemoglobin of 6. She gives a story of having chronic anemia with extensive GI workups primarily or revealing AVMs in the colon which have been cauterized on numerous occasions. The patient states that she typically does not present with frank blood or even black stools but on occasion she has noted some dark stools. She has had multiple transfusions most recently in June for hemoglobin 6 and subsequent rise in hemoglobin to 10. She has never had a hematology workup. She has had an EGD and at least 1 capsule endoscopy which failed to show any source of bleeding. She has had numerous colonoscopies primarily in [place]. She denies any abdominal pain or black stools currently.  Past Medical History  Diagnosis Date  . COPD (chronic obstructive pulmonary disease)   . Hypertrophic obstructive cardiomyopathy     Echo 10/10 EF 65-70% SW 1.6 PW 1.4 mild SAM no LVOT gradient at rest. Valsalva not performed Mild MR. No hyperenhance ment on MRI EF 69%.  . Coronary atherosclerosis of native coronary artery     Cath 1/11: RCA 50-60 ostial o/w normal. EF 70%.   . Pulmonary hypertension     Cath 1/11 RA 8, RV 43/6/14, PA 42/17 (29) PCWP 15 Fick  5.0/2.6. PVR 2.8    . Edema   . Occlusion and stenosis of carotid artery     s/p L CEA u/s 3/11. R 60-79%; L 40-59%. no change since 3/10  . Peripheral vascular disease, unspecified   . Hypertension   . Emphysema   . Asthma   . Hypothyroidism   . Allergic rhinitis   . Gastric AVM   . Heart murmur   . Anginal pain     "pressure"  . Emphysema   . Oxygen dependent 04/23/2012    "concentrated at night; liquid during day"  . Dyspnea 04/23/2012    "all  the time"  . Anemia 04/23/2012    "severe @ times; think caused by colonic AVMs"  . History of blood transfusion     "many times"  . H/O hiatal hernia   . Stroke ~ 2004    denies residual on 04/23/2012  . Arthritis     "probably"  . Breast cancer     Past Surgical History  Procedure Date  . Carotid endarterectomy 2005    left  . Thyroidectomy 2007  . Appendectomy 1950  . Breast lumpectomy 2002    right-hx of radiation  . Dilation and curettage of uterus 1950's  . Cataract extraction w/ intraocular lens implant 02/2011    right  . Excisional hemorrhoidectomy 1950's  . Cardiac catheterization     Medications Prior to Admission  Medication Sig Dispense Refill  . albuterol (PROVENTIL HFA;VENTOLIN HFA) 108 (90 BASE) MCG/ACT inhaler Inhale 2 puffs into the lungs every 6 (six) hours as needed.  1 Inhaler  11  . albuterol (PROVENTIL) (2.5 MG/3ML) 0.083% nebulizer solution Take 3 mLs (2.5 mg total) by nebulization every 6 (six) hours as needed for wheezing.  75 mL  12  . aspirin 81 MG EC tablet Take 81 mg by mouth daily.        Marland Kitchen atorvastatin (LIPITOR) 20 MG tablet Take 20 mg by mouth daily.        Marland Kitchen  budesonide-formoterol (SYMBICORT) 160-4.5 MCG/ACT inhaler Inhale 2 puffs into the lungs 2 (two) times daily.  1 Inhaler  11  . Calcium Carbonate (CALCIUM 600) 1500 MG TABS Take 1 tablet by mouth daily.        . cetirizine (ZYRTEC) 10 MG tablet Take 10 mg by mouth daily.      . Cholecalciferol (VITAMIN D-3 PO) Take 1 tablet by mouth daily.      Marland Kitchen diltiazem (CARDIZEM CD) 120 MG 24 hr capsule Take 1 capsule (120 mg total) by mouth daily.  30 capsule  6  . furosemide (LASIX) 20 MG tablet Take 20 mg by mouth daily.        Marland Kitchen HYDROcodone-acetaminophen (VICODIN) 5-500 MG per tablet Take 1 tablet by mouth every 4 (four) hours as needed. For pain      . levothyroxine (SYNTHROID, LEVOTHROID) 112 MCG tablet Take 112 mcg by mouth daily.       Marland Kitchen levothyroxine (SYNTHROID, LEVOTHROID) 50 MCG tablet Take 50  mcg by mouth daily.        . Multiple Vitamins-Minerals (ONE-A-DAY EXTRAS ANTIOXIDANT) CAPS Take 1 capsule by mouth daily.        . pantoprazole (PROTONIX) 40 MG tablet Take 40 mg by mouth daily.      . predniSONE (DELTASONE) 5 MG tablet Take 5 mg by mouth daily.      Marland Kitchen tiotropium (SPIRIVA) 18 MCG inhalation capsule Place 1 capsule (18 mcg total) into inhaler and inhale daily.  30 capsule  5  . tolterodine (DETROL LA) 2 MG 24 hr capsule Take 2 mg by mouth daily.        Allergies:  Allergies  Allergen Reactions  . Latex Hives and Itching    Family History  Problem Relation Age of Onset  . Emphysema Father   . COPD Sister   . Hypertension Sister     Pulmonary  . Emphysema Sister   . Asthma Sister   . Asthma Sister   . Emphysema Brother     x2  . Colon cancer Brother   . Colon cancer Mother   . Stroke Mother     Social History:  reports that she quit smoking about 14 years ago. Her smoking use included Cigarettes. She has a 30 pack-year smoking history. She has never used smokeless tobacco. She reports that she does not drink alcohol or use illicit drugs.  Review of Systems: negative except as above   Blood pressure 132/45, pulse 92, temperature 97 F (36.1 C), temperature source Oral, resp. rate 16, height 5\' 6"  (1.676 m), weight 74.4 kg (164 lb 0.4 oz), SpO2 98.00%. Head: Normocephalic, without obvious abnormality, atraumatic Neck: no adenopathy, no carotid bruit, no JVD, supple, symmetrical, trachea midline and thyroid not enlarged, symmetric, no tenderness/mass/nodules Resp: clear to auscultation bilaterally Cardio: regular rate and rhythm, S1, S2 normal, no murmur, click, rub or gallop GI: Abdomen soft nondistended with normoactive bowel sounds. No hepatomegaly masses or guarding Extremities: extremities normal, atraumatic, no cyanosis or edema  Results for orders placed during the hospital encounter of 04/23/12 (from the past 48 hour(s))  BASIC METABOLIC PANEL      Status: Abnormal   Collection Time   04/23/12  7:55 PM      Component Value Range Comment   Sodium 140  135 - 145 mEq/L    Potassium 3.5  3.5 - 5.1 mEq/L    Chloride 99  96 - 112 mEq/L    CO2 34 (*) 19 - 32  mEq/L    Glucose, Bld 80  70 - 99 mg/dL    BUN 14  6 - 23 mg/dL    Creatinine, Ser 4.69  0.50 - 1.10 mg/dL    Calcium 8.8  8.4 - 62.9 mg/dL    GFR calc non Af Amer 90 (*) >90 mL/min    GFR calc Af Amer >90  >90 mL/min   MAGNESIUM     Status: Normal   Collection Time   04/23/12  7:55 PM      Component Value Range Comment   Magnesium 2.4  1.5 - 2.5 mg/dL   PHOSPHORUS     Status: Normal   Collection Time   04/23/12  7:55 PM      Component Value Range Comment   Phosphorus 3.8  2.3 - 4.6 mg/dL   CBC     Status: Abnormal   Collection Time   04/23/12  7:55 PM      Component Value Range Comment   WBC 10.5  4.0 - 10.5 K/uL    RBC 2.96 (*) 3.87 - 5.11 MIL/uL    Hemoglobin 6.2 (*) 12.0 - 15.0 g/dL    HCT 52.8 (*) 41.3 - 46.0 %    MCV 74.0 (*) 78.0 - 100.0 fL    MCH 20.9 (*) 26.0 - 34.0 pg    MCHC 28.3 (*) 30.0 - 36.0 g/dL    RDW 24.4 (*) 01.0 - 15.5 %    Platelets 271  150 - 400 K/uL   PROTIME-INR     Status: Normal   Collection Time   04/23/12  7:55 PM      Component Value Range Comment   Prothrombin Time 12.6  11.6 - 15.2 seconds    INR 0.92  0.00 - 1.49   TSH     Status: Normal   Collection Time   04/23/12  7:55 PM      Component Value Range Comment   TSH 0.390  0.350 - 4.500 uIU/mL   CARDIAC PANEL(CRET KIN+CKTOT+MB+TROPI)     Status: Normal   Collection Time   04/23/12  7:55 PM      Component Value Range Comment   Total CK 55  7 - 177 U/L    CK, MB 2.6  0.3 - 4.0 ng/mL    Troponin I <0.30  <0.30 ng/mL    Relative Index RELATIVE INDEX IS INVALID  0.0 - 2.5   PRO B NATRIURETIC PEPTIDE     Status: Normal   Collection Time   04/23/12  7:55 PM      Component Value Range Comment   Pro B Natriuretic peptide (BNP) 425.6  0 - 450 pg/mL   TYPE AND SCREEN     Status: Normal (Preliminary  result)   Collection Time   04/23/12  9:53 PM      Component Value Range Comment   ABO/RH(D) O POS      Antibody Screen NEG      Sample Expiration 04/26/2012      Unit Number [id number]      Blood Component Type RBC LR PHER2      Unit division 00      Status of Unit ISSUED,FINAL      Transfusion Status OK TO TRANSFUSE      Crossmatch Result Compatible      Unit Number [id number]      Blood Component Type RED CELLS,LR      Unit division 00      Status of Unit  ISSUED      Transfusion Status OK TO TRANSFUSE      Crossmatch Result Compatible      Unit Number [id number]      Blood Component Type RBC LR PHER1      Unit division 00      Status of Unit ALLOCATED      Transfusion Status OK TO TRANSFUSE      Crossmatch Result Compatible     PREPARE RBC (CROSSMATCH)     Status: Normal   Collection Time   04/23/12  9:53 PM      Component Value Range Comment   Order Confirmation ORDER PROCESSED BY BLOOD BANK     ABO/RH     Status: Normal   Collection Time   04/23/12  9:53 PM      Component Value Range Comment   ABO/RH(D) O POS     CARDIAC PANEL(CRET KIN+CKTOT+MB+TROPI)     Status: Normal   Collection Time   04/24/12  3:00 AM      Component Value Range Comment   Total CK 39  7 - 177 U/L    CK, MB 2.2  0.3 - 4.0 ng/mL    Troponin I <0.30  <0.30 ng/mL    Relative Index RELATIVE INDEX IS INVALID  0.0 - 2.5   CBC     Status: Abnormal   Collection Time   04/24/12  3:00 AM      Component Value Range Comment   WBC 9.2  4.0 - 10.5 K/uL    RBC 3.08 (*) 3.87 - 5.11 MIL/uL    Hemoglobin 6.7 (*) 12.0 - 15.0 g/dL CRITICAL VALUE NOTED.  VALUE IS CONSISTENT WITH PREVIOUSLY REPORTED AND CALLED VALUE.   HCT 23.2 (*) 36.0 - 46.0 %    MCV 75.3 (*) 78.0 - 100.0 fL    MCH 21.8 (*) 26.0 - 34.0 pg    MCHC 28.9 (*) 30.0 - 36.0 g/dL    RDW 11.9 (*) 14.7 - 15.5 %    Platelets 222  150 - 400 K/uL   BASIC METABOLIC PANEL     Status: Abnormal   Collection Time   04/24/12  3:00 AM      Component Value Range  Comment   Sodium 146 (*) 135 - 145 mEq/L    Potassium 3.5  3.5 - 5.1 mEq/L    Chloride 105  96 - 112 mEq/L    CO2 34 (*) 19 - 32 mEq/L    Glucose, Bld 129 (*) 70 - 99 mg/dL    BUN 12  6 - 23 mg/dL    Creatinine, Ser 8.29  0.50 - 1.10 mg/dL    Calcium 8.5  8.4 - 56.2 mg/dL    GFR calc non Af Amer 89 (*) >90 mL/min    GFR calc Af Amer >90  >90 mL/min   PREPARE RBC (CROSSMATCH)     Status: Normal   Collection Time   04/24/12  8:43 AM      Component Value Range Comment   Order Confirmation ORDER PROCESSED BY BLOOD BANK     CARDIAC PANEL(CRET KIN+CKTOT+MB+TROPI)     Status: Normal   Collection Time   04/24/12 10:36 AM      Component Value Range Comment   Total CK 41  7 - 177 U/L    CK, MB 2.1  0.3 - 4.0 ng/mL    Troponin I <0.30  <0.30 ng/mL    Relative Index RELATIVE INDEX IS INVALID  0.0 -  2.5    Portable Chest 1 View  04/23/2012  *RADIOLOGY REPORT*  Clinical Data: Chest pain  PORTABLE CHEST - 1 VIEW  Comparison: 03/06/2012  Findings: Background COPD/emphysema noted.  Stable cardiomegaly without CHF or pneumonia.  No effusion, collapse, consolidation, pneumothorax.  Atherosclerosis of the aorta.  Trachea is midline.  IMPRESSION: Stable COPD pattern.  No superimposed acute process  Original Report Authenticated By: Judie Petit. Ruel Favors, M.D.    Assessment: Recurrent anemia presumably from GI bleeding, by history primary finding being colonic AVMs Plan:  Defer any acute workup pending cardiac or pulmonary workup. We'll obtain stool Hemoccults to see if her anemia is also fully multifactorial and whether hematology workup might be needed. Abdalla Naramore C 04/24/2012, 12:05 PM

## 2012-04-24 NOTE — Consult Note (Signed)
[facility] Consult: 3:40 PM 04/24/2012   Referring Provider: Dr Gala Romney  Primary Care Physician:  Ethelda Chick MD at [facility] in [place] Primary Gastroenterologist:  Dr. Markham Jordan in [place]   Reason for Consultation:  Anemia.   HPI: [name] is a 78 y.o. female.  Former smoker with COPD, on 5mg  Prednisone daily.  Hx breast CA, CVA, diastolic dysfxn and single vessel CAD  Admitted 04/23/12 with dyspnea and chest pain, Hgb 6.2 yesterday.  According to Dr Gala Romney, had been 10.8 about 8 weeks ago. It was 11.4 in Jan 2013.   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  She estimates she has has at least 7 colonoscopies over that period of time.  AVMs been treated with electrocautery in the past but no AVMs encountered at latest colonoscopy in winter 2012.  On prior colonoscopies in past years, has had colon polyps removed.  Has had several EGDs, family recall that no AVMs have been found "above".  She has also had capsule endoscopy in 2012, findings not recalled.   Has not had overt melena, hematochezia.  Takes 81 mg ASA.  She almost never sees melena or blood PR, the bleeding is picked up when she becomes weak and tired and is found to be anemic.  Once she had a vigorous bleed where she passed overt blood, that was a few years ago.     She got one unit PRBC overnight.  Hgb to 6.7.  3rd unit blood transfusing at present.  She has been transfused on several past occassions.   Dr Gala Romney feels that she has had demand ischemia and considering nuc scan vs repeat cath ( cath last in Jan 2013). .  Troponins are not elevated but has Q waves on EKG.  ROS No bruising no unusual bleeding  No epistaxis.  No dizzyness, no palpitations. Resolved pedal edema No recent weight gain.  Lost a few # in June when she had excessive fluid, treated with diuretics. Good appetite, no nausea, no  belly pain.  No skin rash or itching. No vision deficits.  No headache . No syncope, no seizure   Past Medical History  Diagnosis Date  . COPD (chronic obstructive pulmonary disease)   . Hypertrophic obstructive cardiomyopathy     Echo 10/10 EF 65-70% SW 1.6 PW 1.4 mild SAM no LVOT gradient at rest. Valsalva not performed Mild MR. No hyperenhance ment on MRI EF 69%.  . Coronary atherosclerosis of native coronary artery     Cath 1/11: RCA 50-60 ostial o/w normal. EF 70%.   . Pulmonary hypertension     Cath 1/11 RA 8, RV 43/6/14, PA 42/17 (29) PCWP 15 Fick  5.0/2.6. PVR 2.8    . Edema   . Occlusion and stenosis of carotid artery     s/p L CEA u/s 3/11. R 60-79%; L 40-59%. no change since 3/10  . Peripheral vascular disease, unspecified   . Hypertension   . Emphysema   . Asthma   . Hypothyroidism   . Allergic rhinitis   . Gastric AVM   . Heart murmur   . Anginal pain     "pressure"  . Emphysema   . Oxygen dependent 04/23/2012    "concentrated at night; liquid during day"  . Dyspnea 04/23/2012    "all the time"  . Anemia 04/23/2012    "severe @ times; think caused by colonic AVMs"  . History of blood  transfusion     "many times"  . H/O hiatal hernia   . Stroke ~ 2004    denies residual on 04/23/2012  . Arthritis     "probably"  . Breast cancer     Past Surgical History  Procedure Date  . Carotid endarterectomy 2005    left  . Thyroidectomy 2007  . Appendectomy 1950  . Breast lumpectomy 2002    right-hx of radiation  . Dilation and curettage of uterus 1950's  . Cataract extraction w/ intraocular lens implant 02/2011    right  . Excisional hemorrhoidectomy 1950's  . Cardiac catheterization     Prior to Admission medications   Medication Sig Start Date End Date Taking? Authorizing Provider  albuterol (PROVENTIL HFA;VENTOLIN HFA) 108 (90 BASE) MCG/ACT inhaler Inhale 2 puffs into the lungs every 6 (six) hours as needed. 02/24/12  Yes Tammy S Parrett, NP  albuterol  (PROVENTIL) (2.5 MG/3ML) 0.083% nebulizer solution Take 3 mLs (2.5 mg total) by nebulization every 6 (six) hours as needed for wheezing. 02/24/12 02/23/13 Yes Tammy S Parrett, NP  aspirin 81 MG EC tablet Take 81 mg by mouth daily.     Yes Historical Provider, MD  atorvastatin (LIPITOR) 20 MG tablet Take 20 mg by mouth daily.     Yes Historical Provider, MD  budesonide-formoterol (SYMBICORT) 160-4.5 MCG/ACT inhaler Inhale 2 puffs into the lungs 2 (two) times daily. 12/03/11  Yes Leslye Peer, MD  Calcium Carbonate (CALCIUM 600) 1500 MG TABS Take 1 tablet by mouth daily.     Yes Historical Provider, MD  cetirizine (ZYRTEC) 10 MG tablet Take 10 mg by mouth daily.   Yes Historical Provider, MD  Cholecalciferol (VITAMIN D-3 PO) Take 1 tablet by mouth daily.   Yes Historical Provider, MD  diltiazem (CARDIZEM CD) 120 MG 24 hr capsule Take 1 capsule (120 mg total) by mouth daily. 03/06/12 03/06/13 Yes Bevelyn Buckles Bensimhon, MD  furosemide (LASIX) 20 MG tablet Take 20 mg by mouth daily.     Yes Historical Provider, MD  HYDROcodone-acetaminophen (VICODIN) 5-500 MG per tablet Take 1 tablet by mouth every 4 (four) hours as needed. For pain 09/16/11  Yes Historical Provider, MD  levothyroxine (SYNTHROID, LEVOTHROID) 112 MCG tablet Take 112 mcg by mouth daily.    Yes Historical Provider, MD  levothyroxine (SYNTHROID, LEVOTHROID) 50 MCG tablet Take 50 mcg by mouth daily.     Yes Historical Provider, MD  Multiple Vitamins-Minerals (ONE-A-DAY EXTRAS ANTIOXIDANT) CAPS Take 1 capsule by mouth daily.     Yes Historical Provider, MD  pantoprazole (PROTONIX) 40 MG tablet Take 40 mg by mouth daily.   Yes Historical Provider, MD  predniSONE (DELTASONE) 5 MG tablet Take 5 mg by mouth daily.   Yes Historical Provider, MD  tiotropium (SPIRIVA) 18 MCG inhalation capsule Place 1 capsule (18 mcg total) into inhaler and inhale daily. 12/03/11  Yes Leslye Peer, MD  tolterodine (DETROL LA) 2 MG 24 hr capsule Take 2 mg by mouth daily.    Yes Historical Provider, MD    Scheduled Meds:    . antiseptic oral rinse  15 mL Mouth Rinse BID  . aspirin EC  325 mg Oral Daily  . atorvastatin  20 mg Oral Daily  . budesonide-formoterol  2 puff Inhalation BID  . diltiazem  120 mg Oral Daily  . fesoterodine  4 mg Oral Daily  . furosemide  20 mg Oral Daily  . levothyroxine  112 mcg Oral Daily  . levothyroxine  50 mcg Oral Daily  . loratadine  10 mg Oral Daily  . magnesium hydroxide  30 mL Oral Once  . multivitamin with minerals  1 tablet Oral Daily  . pantoprazole  40 mg Oral BID  . predniSONE  5 mg Oral Daily  . sodium chloride  3 mL Intravenous Q12H  . sodium chloride  3 mL Intravenous Q12H  . tiotropium  18 mcg Inhalation Daily   Infusions:   PRN Meds: sodium chloride, albuterol, sodium chloride, zolpidem   Allergies as of 04/23/2012 - Review Complete 04/23/2012  Allergen Reaction Noted  . Latex Hives and Itching 12/11/2010    Family History  Problem Relation Age of Onset  . Emphysema Father   . COPD Sister   . Hypertension Sister     Pulmonary  . Emphysema Sister   . Asthma Sister   . Asthma Sister   . Emphysema Brother     x2  . Colon cancer Brother   . Colon cancer Mother   . Stroke Mother     History   Social History  . Marital Status: Single    Spouse Name: N/A    Number of Children: N/A  . Years of Education: N/A   Occupational History  . 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 Smoking status: Former Smoker -- 1.0 packs/day for 30 years    Types: Cigarettes    Quit date: 09/16/1997  . Smokeless tobacco: Never Used  . Alcohol Use: No  . Drug Use: No  . Sexually Active: Not Currently   Other Topics Concern  . Not on file   Social History Narrative   Single, no childrenLives with 2 sistersDoes not get regular exercise     PHYSICAL EXAM: Vital signs in last 24 hours: Temp:  [97 F (36.1 C)-98.8 F (37.1 C)] 98.4 F (36.9 C)  (08/09 1526) Pulse Rate:  [75-102] 87  (08/09 1526) Resp:  [14-18] 16  (08/09 1526) BP: (97-133)/(45-72) 130/61 mmHg (08/09 1526) SpO2:  [90 %-100 %] 97 % (08/09 1411) Weight:  [164 lb 0.4 oz (74.4 kg)-167 lb 3.2 oz (75.841 kg)] 164 lb 0.4 oz (74.4 kg) (08/09 0647)  General: pale elderly WF in NAD.  Does not look ill.  Head:  No asymetry , no facial edema  Eyes:  No icterus, EOMI Ears:  Not HOH  Nose:  No congestion or discharge. Mouth:  Pink, moist, clear MM Neck:  No JVD or masses.  Lungs:  Diminished BS B.  No crackles or wheezes.  Heart: RRR with 2/6 harsh systolic murmer.  Abdomen:  Soft, NT, ND, no Hsm.   Rectal: Hard stool in vault, brown but 2+ FOB positive.  No masses  Musc/Skeltl: no joint swelling Extremities:  No pedal edema  Neurologic:  No tremor, no confusion, fully oriented.  No gross weakness Skin:  Blanchable birthmark on back of her neck Tattoos:  none Nodes:  No cervical or inguinal adenopathy.    Psych:  Pleasant, relaxed.   Intake/Output from previous day: 08/08 0701 - 08/09 0700 In: 365 [I.V.:20; Blood:345] Out: -  Intake/Output this shift: Total I/O In: 1245 [P.O.:600; Blood:645] Out: 1 [Urine:1]  LAB RESULTS:  Basename 04/24/12 0300 04/23/12 1955  WBC 9.2 10.5  HGB 6.7* 6.2*  HCT 23.2* 21.9*  PLT 222 271   BMET Lab Results  Component Value Date   NA 146* 04/24/2012   NA 140 04/23/2012  NA 139 10/03/2011   K 3.5 04/24/2012   K 3.5 04/23/2012   K 3.6 10/03/2011   CL 105 04/24/2012   CL 99 04/23/2012   CL 96 10/03/2011   CO2 34* 04/24/2012   CO2 34* 04/23/2012   CO2 31 10/03/2011   GLUCOSE 129* 04/24/2012   GLUCOSE 80 04/23/2012   GLUCOSE 92 10/03/2011   BUN 12 04/24/2012   BUN 14 04/23/2012   BUN 13 10/03/2011   CREATININE 0.54 04/24/2012   CREATININE 0.52 04/23/2012   CREATININE 0.5 10/03/2011   CALCIUM 8.5 04/24/2012   CALCIUM 8.8 04/23/2012   CALCIUM 8.5 10/03/2011   LFT No results found for this basename:  PROT:3,ALBUMIN:3,AST:3,ALT:3,ALKPHOS:3,BILITOT:3,BILIDIR:3,IBILI:3 in the last 72 hours PT/INR Lab Results  Component Value Date   INR 0.92 04/23/2012   INR 0.93 09/13/2009    RADIOLOGY STUDIES: Portable Chest 1 View  04/23/2012  *RADIOLOGY REPORT*  Clinical Data: Chest pain  PORTABLE CHEST - 1 VIEW  Comparison: 03/06/2012  Findings: Background COPD/emphysema noted.  Stable cardiomegaly without CHF or pneumonia.  No effusion, collapse, consolidation, pneumothorax.  Atherosclerosis of the aorta.  Trachea is midline.  IMPRESSION: Stable COPD pattern.  No superimposed acute process  Original Report Authenticated By: Judie Petit. Ruel Favors, M.D.    ENDOSCOPIC STUDIES: About 4 colonoscopies, 3 EGDs and capsule endoscopy in last 18 months.  By Dr Markham Jordan.  His office is currently closed and unable to get records. .   IMPRESSION: *  Recurrent symptomatic, blood loss anemia.  Likely from known AVMs.  These have been located in the colon in the past and cauterized.  Multiple colon and EGD studies as well as capsule endo in the last year S/p transfusion, has had these in the past as well *  Steroid dependent COPD *  Angina.   Abnormal ECG with inferior and lateral Q waves but previous Myoview and echo have been normal Cardiac cath in Jan 2013 showed mild non-obs CAD with ~50% ostial RCA lesion. Left system normal. EF 70%. Mild PAH.  Dr Gala Romney sates:  "If CP resolves with transfusion will plan inpatient nuclear study to risk stratify. If CP persists will need inpatient cath"   PLAN: *  Per Dr Juanda Chance.  Repeat colonoscopy? *  Sent fax request for records to [facility]. *  Someone has ordered Iron studies for the AM, but these will not be accurate to assess her endogenous iron stores post PRBC transfusions.     LOS: 1 day   Jennye Moccasin  04/24/2012, 3:40 PM Pager [phone]  I have reviewed the above note, examined the patient and spoke extensively with the niece and the pt.She has a marked microcytosis, her MCV  has drifted down to 70 from 92. She has not been of iron supplement all the time because she was told by her GI doctor that she did not have to. Her Hgb drifted down   From 11.4 in January to 6.2 yesterday. She has not been getting CBC's on regular basis. She has avm's likely in the small bowl . Had about 4 colonoscopies last year- no avm's on last exam. SBCE ?? No avm's.  The priority now is to give her IV iron so she start to retic After current hospitalization, she will need H/H checked every 2 weeks first, then q 4 weeks in our office ( she sees Dr Delton Coombes) She will likely need a repeat Iron infusion in 4 -6 weeks  To laser the avm's is usually not effective long  term since there are many out of reach in the jejunum/ileum Negative SBCE is not reliable in ruling out avm's, Pt and the niece are satisfied with current plan and frequent communication concerning maintaining her blood counts Sprue profile.B12 level I will be seeing her in the hospital    [facility] Pager # [phone]

## 2012-04-24 NOTE — Consult Note (Addendum)
Referring Physician: Dr. Delton Coombes Reason for Consultation: Chest pain   HPI:  [name] is a delightful 78 year old woman with a history of COPD on home O2, hypertension, hyperlipidemia, GIB due to AVMs and peripheral arterial disease. She is status post multiple CVAs in the setting of a previous left carotid stenosis, which she has underwent endarterectomy before at [facility] in 2005.   She denies any history of known MI. She does have an abnormal ECG with inferior and lateral Q waves but Myoview and echo have been normal. Cath January 2012 showed Mild non-obs CAD with ~50% ostial RCA lesion. Left system normal. EF 70%. Mild PAH. Right atrial pressure mean of 8, RV pressure 43/6 with an EDP of 14, PA pressure 42/17 with a mean of 29, pulmonary capillary wedge pressure was mean of 15.   Saw Dr. Delton Coombes yesterday and c/o exertional chest pressure of past weeka nad a half. Happens every times she walk particularly if she is talking at same. No radiation. No change in respiratory status. No resting CP.   Admitted from Dr. Kavin Leech office for possible cath. Hgb found to be 6.2. Denies melena, BRBPR. No ab pain. Got 1 U RBCs. Has known AVMs follwos with Dr. Mechele Collin in [place] and has had multiple endoscopies with cauterization in the large bowel.  About 8 weks ago had CBC and HGb was 10.4.  Review of Systems:     Cardiac Review of Systems: {Y] = yes [ ]  = no  Chest Pain [    ]  Resting SOB [   ] Exertional SOB  [ y ]  Orthopnea [  ]   Pedal Edema [  y ]    Palpitations [  ] Syncope  [  ]   Presyncope [   ]  General Review of Systems: [Y] = yes [  ]=no Constitional: recent weight change [  ]; anorexia [  ]; fatigue [place]  ]; nausea [  ]; night sweats [  ]; fever [  ]; or chills [  ];                                                                                                                                          Dental: poor dentition[  ];   Eye : blurred vision [  ]; diplopia [   ]; vision changes  [  ];  Amaurosis fugax[  ]; Resp: cough [  y];  wheezing[  ];  hemoptysis[  ]; shortness of breath[  ]; paroxysmal nocturnal dyspnea[  ]; dyspnea on exertion[  y]; or orthopnea[  ];  GI:  gallstones[  ], vomiting[  ];  dysphagia[  ]; melena[  ];  hematochezia [  ]; heartburn[  ]; GU: kidney stones [  ]; hematuria[  ];   dysuria [  ];  nocturia[  ];  history of  obstruction [  ];                 Skin: rash, swelling[  ];, hair loss[  ];  peripheral edema[  ];  or itching[  ]; Musculosketetal: myalgias[  ];  joint swelling[  ];  joint erythema[  ];  joint pain[  y];  back pain[  ];  Heme/Lymph: bruising[  ];  bleeding[  ];  anemia[y  ];  Neuro: TIA[  ];  headaches[  ];  stroke[  ];  vertigo[  ];  seizures[  ];   paresthesias[  ];  difficulty walking[  ];  Psych:depression[  ]; anxiety[  ];  Endocrine: diabetes[  ];  thyroid dysfunction[  ];  Other:  Past Medical History  Diagnosis Date  . COPD (chronic obstructive pulmonary disease)   . Hypertrophic obstructive cardiomyopathy     Echo 10/10 EF 65-70% SW 1.6 PW 1.4 mild SAM no LVOT gradient at rest. Valsalva not performed Mild MR. No hyperenhance ment on MRI EF 69%.  . Coronary atherosclerosis of native coronary artery     Cath 1/11: RCA 50-60 ostial o/w normal. EF 70%.   . Pulmonary hypertension     Cath 1/11 RA 8, RV 43/6/14, PA 42/17 (29) PCWP 15 Fick  5.0/2.6. PVR 2.8    . Edema   . Occlusion and stenosis of carotid artery     s/p L CEA u/s 3/11. R 60-79%; L 40-59%. no change since 3/10  . Peripheral vascular disease, unspecified   . Hypertension   . Emphysema   . Asthma   . Hypothyroidism   . Allergic rhinitis   . Gastric AVM   . Heart murmur   . Anginal pain     "pressure"  . Emphysema   . Oxygen dependent 04/23/2012    "concentrated at night; liquid during day"  . Dyspnea 04/23/2012    "all the time"  . Anemia 04/23/2012    "severe @ times; think caused by colonic AVMs"  . History of blood transfusion     "many times"  .  H/O hiatal hernia   . Stroke ~ 2004    denies residual on 04/23/2012  . Arthritis     "probably"  . Breast cancer     Medications Prior to Admission  Medication Sig Dispense Refill  . albuterol (PROVENTIL HFA;VENTOLIN HFA) 108 (90 BASE) MCG/ACT inhaler Inhale 2 puffs into the lungs every 6 (six) hours as needed.  1 Inhaler  11  . albuterol (PROVENTIL) (2.5 MG/3ML) 0.083% nebulizer solution Take 3 mLs (2.5 mg total) by nebulization every 6 (six) hours as needed for wheezing.  75 mL  12  . aspirin 81 MG EC tablet Take 81 mg by mouth daily.        Marland Kitchen atorvastatin (LIPITOR) 20 MG tablet Take 20 mg by mouth daily.        . budesonide-formoterol (SYMBICORT) 160-4.5 MCG/ACT inhaler Inhale 2 puffs into the lungs 2 (two) times daily.  1 Inhaler  11  . Calcium Carbonate (CALCIUM 600) 1500 MG TABS Take 1 tablet by mouth daily.        . cetirizine (ZYRTEC) 10 MG tablet Take 10 mg by mouth daily.      . Cholecalciferol (VITAMIN D-3 PO) Take 1 tablet by mouth daily.      Marland Kitchen diltiazem (CARDIZEM CD) 120 MG 24 hr capsule Take 1 capsule (120 mg total) by mouth daily.  30 capsule  6  . furosemide (LASIX)  20 MG tablet Take 20 mg by mouth daily.        Marland Kitchen HYDROcodone-acetaminophen (VICODIN) 5-500 MG per tablet Take 1 tablet by mouth every 4 (four) hours as needed. For pain      . levothyroxine (SYNTHROID, LEVOTHROID) 112 MCG tablet Take 112 mcg by mouth daily.       Marland Kitchen levothyroxine (SYNTHROID, LEVOTHROID) 50 MCG tablet Take 50 mcg by mouth daily.        . Multiple Vitamins-Minerals (ONE-A-DAY EXTRAS ANTIOXIDANT) CAPS Take 1 capsule by mouth daily.        . pantoprazole (PROTONIX) 40 MG tablet Take 40 mg by mouth daily.      . predniSONE (DELTASONE) 5 MG tablet Take 5 mg by mouth daily.      Marland Kitchen tiotropium (SPIRIVA) 18 MCG inhalation capsule Place 1 capsule (18 mcg total) into inhaler and inhale daily.  30 capsule  5  . tolterodine (DETROL LA) 2 MG 24 hr capsule Take 2 mg by mouth daily.            Marland Kitchen antiseptic  oral rinse  15 mL Mouth Rinse BID  . aspirin EC  325 mg Oral Daily  . atorvastatin  20 mg Oral Daily  . budesonide-formoterol  2 puff Inhalation BID  . diltiazem  120 mg Oral Daily  . fesoterodine  4 mg Oral Daily  . furosemide  40 mg Oral BID  . levothyroxine  112 mcg Oral Daily  . levothyroxine  50 mcg Oral Daily  . loratadine  10 mg Oral Daily  . multivitamin with minerals  1 tablet Oral Daily  . pantoprazole  40 mg Oral BID  . predniSONE  5 mg Oral Daily  . sodium chloride  3 mL Intravenous Q12H  . sodium chloride  3 mL Intravenous Q12H  . tiotropium  18 mcg Inhalation Daily  . DISCONTD: One-A-Day Extras Antioxidant  1 capsule Oral Daily    Infusions:    Allergies  Allergen Reactions  . Latex Hives and Itching    History   Social History  . Marital Status: Single    Spouse Name: N/A    Number of Children: N/A  . Years of Education: N/A   Occupational History  . 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 Smoking status: Former Smoker -- 1.0 packs/day for 30 years    Types: Cigarettes    Quit date: 09/16/1997  . Smokeless tobacco: Never Used  . Alcohol Use: No  . Drug Use: No  . Sexually Active: Not Currently   Other Topics Concern  . Not on file   Social History Narrative   Single, no childrenLives with 2 sistersDoes not get regular exercise    Family History  Problem Relation Age of Onset  . Emphysema Father   . COPD Sister   . Hypertension Sister     Pulmonary  . Emphysema Sister   . Asthma Sister   . Asthma Sister   . Emphysema Brother     x2  . Colon cancer Brother   . Colon cancer Mother   . Stroke Mother     PHYSICAL EXAM: Filed Vitals:   04/24/12 0647  BP: 97/59  Pulse: 75  Temp: 98.2 F (36.8 C)  Resp: 17     Intake/Output Summary (Last 24 hours) at 04/24/12 0726 Last data filed at 04/24/12 0247  Gross per 24 hour  Intake  345 ml  Output      0 ml  Net    345 ml     General:  Elderly. No acute distress. On O2 HEENT: normal Neck: supple. no JVD. Carotids 2+ bilat; + bilateral bruits. No lymphadenopathy or thryomegaly appreciated. Cor: PMI nondisplaced. Regular rate & rhythm. No rubs, gallops. 3/6 AS murmur. S2 preserved Lungs: poor arm ovementthroughout Abdomen: soft, nontender, nondistended. No hepatosplenomegaly. No bruits or masses. Good bowel sounds. Extremities: no cyanosis, clubbing, rash, edema Neuro: alert & oriented x 3, cranial nerves grossly intact. moves all 4 extremities w/o difficulty. Affect pleasant.  ECG: SR inferior lat Qs. No ST-T wave abnormalities. (no change)   Results for orders placed during the hospital encounter of 04/23/12 (from the past 24 hour(s))  BASIC METABOLIC PANEL     Status: Abnormal   Collection Time   04/23/12  7:55 PM      Component Value Range   Sodium 140  135 - 145 mEq/L   Potassium 3.5  3.5 - 5.1 mEq/L   Chloride 99  96 - 112 mEq/L   CO2 34 (*) 19 - 32 mEq/L   Glucose, Bld 80  70 - 99 mg/dL   BUN 14  6 - 23 mg/dL   Creatinine, Ser 0.45  0.50 - 1.10 mg/dL   Calcium 8.8  8.4 - 40.9 mg/dL   GFR calc non Af Amer 90 (*) >90 mL/min   GFR calc Af Amer >90  >90 mL/min  MAGNESIUM     Status: Normal   Collection Time   04/23/12  7:55 PM      Component Value Range   Magnesium 2.4  1.5 - 2.5 mg/dL  PHOSPHORUS     Status: Normal   Collection Time   04/23/12  7:55 PM      Component Value Range   Phosphorus 3.8  2.3 - 4.6 mg/dL  CBC     Status: Abnormal   Collection Time   04/23/12  7:55 PM      Component Value Range   WBC 10.5  4.0 - 10.5 K/uL   RBC 2.96 (*) 3.87 - 5.11 MIL/uL   Hemoglobin 6.2 (*) 12.0 - 15.0 g/dL   HCT 81.1 (*) 91.4 - 78.2 %   MCV 74.0 (*) 78.0 - 100.0 fL   MCH 20.9 (*) 26.0 - 34.0 pg   MCHC 28.3 (*) 30.0 - 36.0 g/dL   RDW 95.6 (*) 21.3 - 08.6 %   Platelets 271  150 - 400 K/uL  PROTIME-INR     Status: Normal   Collection Time   04/23/12  7:55 PM      Component Value Range    Prothrombin Time 12.6  11.6 - 15.2 seconds   INR 0.92  0.00 - 1.49  TSH     Status: Normal   Collection Time   04/23/12  7:55 PM      Component Value Range   TSH 0.390  0.350 - 4.500 uIU/mL  CARDIAC PANEL(CRET KIN+CKTOT+MB+TROPI)     Status: Normal   Collection Time   04/23/12  7:55 PM      Component Value Range   Total CK 55  7 - 177 U/L   CK, MB 2.6  0.3 - 4.0 ng/mL   Troponin I <0.30  <0.30 ng/mL   Relative Index RELATIVE INDEX IS INVALID  0.0 - 2.5  PRO B NATRIURETIC PEPTIDE     Status: Normal   Collection Time   04/23/12  7:55  PM      Component Value Range   Pro B Natriuretic peptide (BNP) 425.6  0 - 450 pg/mL  TYPE AND SCREEN     Status: Normal   Collection Time   04/23/12  9:53 PM      Component Value Range   ABO/RH(D) O POS     Antibody Screen NEG     Sample Expiration 04/26/2012     Unit Number [id number]     Blood Component Type RBC LR PHER2     Unit division 00     Status of Unit ISSUED,FINAL     Transfusion Status OK TO TRANSFUSE     Crossmatch Result Compatible    PREPARE RBC (CROSSMATCH)     Status: Normal   Collection Time   04/23/12  9:53 PM      Component Value Range   Order Confirmation ORDER PROCESSED BY BLOOD BANK    ABO/RH     Status: Normal   Collection Time   04/23/12  9:53 PM      Component Value Range   ABO/RH(D) O POS    CARDIAC PANEL(CRET KIN+CKTOT+MB+TROPI)     Status: Normal   Collection Time   04/24/12  3:00 AM      Component Value Range   Total CK 39  7 - 177 U/L   CK, MB 2.2  0.3 - 4.0 ng/mL   Troponin I <0.30  <0.30 ng/mL   Relative Index RELATIVE INDEX IS INVALID  0.0 - 2.5  CBC     Status: Abnormal   Collection Time   04/24/12  3:00 AM      Component Value Range   WBC 9.2  4.0 - 10.5 K/uL   RBC 3.08 (*) 3.87 - 5.11 MIL/uL   Hemoglobin 6.7 (*) 12.0 - 15.0 g/dL   HCT 08.6 (*) 57.8 - 46.9 %   MCV 75.3 (*) 78.0 - 100.0 fL   MCH 21.8 (*) 26.0 - 34.0 pg   MCHC 28.9 (*) 30.0 - 36.0 g/dL   RDW 62.9 (*) 52.8 - 41.3 %   Platelets 222  150 -  400 K/uL  BASIC METABOLIC PANEL     Status: Abnormal   Collection Time   04/24/12  3:00 AM      Component Value Range   Sodium 146 (*) 135 - 145 mEq/L   Potassium 3.5  3.5 - 5.1 mEq/L   Chloride 105  96 - 112 mEq/L   CO2 34 (*) 19 - 32 mEq/L   Glucose, Bld 129 (*) 70 - 99 mg/dL   BUN 12  6 - 23 mg/dL   Creatinine, Ser 2.44  0.50 - 1.10 mg/dL   Calcium 8.5  8.4 - 01.0 mg/dL   GFR calc non Af Amer 89 (*) >90 mL/min   GFR calc Af Amer >90  >90 mL/min   Portable Chest 1 View  04/23/2012  *RADIOLOGY REPORT*  Clinical Data: Chest pain  PORTABLE CHEST - 1 VIEW  Comparison: 03/06/2012  Findings: Background COPD/emphysema noted.  Stable cardiomegaly without CHF or pneumonia.  No effusion, collapse, consolidation, pneumothorax.  Atherosclerosis of the aorta.  Trachea is midline.  IMPRESSION: Stable COPD pattern.  No superimposed acute process  Original Report Authenticated By: Judie Petit. Ruel Favors, M.D.     ASSESSMENT: 1. CP      --known abnormal ECGs with chronic inferior/lateral Qs 2. Non-obstructive CAD on cath 1/12 3. Iron def anemia (microcytic) 4. Known AVMs 5. COPD, O2 dependent 6. PAD  7. AS murmur - echo from 2/13 with mild to moderate LVOT gradient  PLAN/DISCUSSION:  CP sounds ischemic in nature but most likely due to demand ischemia from severe anemia. Would postpone cath at this time and continue to transfuse. Needs GI to see. Hopefully exertional CP will resolve. If not will need cath sooner rather than later, though I worry about her ability to tolerate dual antiplatelet therapy.   If CP resolves with transfusion will plan inpatient nuclear study to risk stratify. If CP persists will need inpatient cath. We will follow closely. I discussed with her daughter by phone.  Does have a mild dynamic LVOT gradient which is likely worse with anemia.   Alvin Diffee,MD 7:45 AM

## 2012-04-24 NOTE — Progress Notes (Signed)
Critical Lab Value: Hgb 6.2.  Physician notified and received order for transfusion of 1 unit of RBCs.  Arva Chafe

## 2012-04-24 NOTE — Plan of Care (Signed)
Problem: Phase I Progression Outcomes Goal: Hemodynamically stable Outcome: Progressing Hgb 6.2, administering 1 unit pRBCs; vitals stable

## 2012-04-24 NOTE — Progress Notes (Signed)
Notified physician on-call , Dr. Vassie Loll, that Hgb is now 6.7 following transfusion.  No new orders at this time.  Will continue to monitor.  Arva Chafe

## 2012-04-24 NOTE — Clinical Documentation Improvement (Signed)
RESPIRATORY FAILURE DOCUMENTATION CLARIFICATION QUERY   THIS DOCUMENT IS NOT A PERMANENT PART OF THE MEDICAL RECORD  TO RESPOND TO THE THIS QUERY, FOLLOW THE INSTRUCTIONS BELOW:  1. If needed, update documentation for the patient's encounter via the notes activity.  2. Access this query again and click edit on the In [employer].  3. After updating, or not, click F2 to complete all highlighted (required) fields concerning your review. Select "additional documentation in the medical record" OR "no additional documentation provided".  4. Click Sign note button.  5. The deficiency will fall out of your In Basket *Please let us know if you are not able to complete this workflow by phone or e-mail (listed below).  Please update your documentation within the medical record to reflect your response to this query.                                                                                    04/24/12  Dear [name],  In a better effort to capture your patient's severity of illness, reflect appropriate length of stay and utilization of resources, a review of the patient medical record has revealed the following indicators.    Based on your clinical judgment, please clarify and document in a progress note and/or discharge summary the clinical condition associated with the following supporting information:  In responding to this query please exercise your independent judgment.  The fact that a query is asked, does not imply that any particular answer is desired or expected.  Possible Clinical Conditions?  _______Acute on Chronic Respiratory Failure  ____XX___Chronic Respiratory Failure  _______Other Condition  _______Cannot Clinically Determine    Supporting Information:  Risk Factors: Patient with a history of emphysema, COPD, O2 dependent with home O2, presents with increasing dyspnea per 8/08 progress notes.  Treatment: Oxygen @ 3L n/c per doc flowsheets.       You may use possible, probable, or suspect with inpatient documentation. possible, probable, suspected diagnoses MUST be documented at the time of discharge  Reviewed: additional documentation in the medical record  Thank You,  Marciano Sequin,  Clinical Documentation Specialist:  Pager: [phone]  Health Information Management [place]

## 2012-04-24 NOTE — Progress Notes (Signed)
   Results of GI consult noted.   There is no further cardiac or pulmonary work-up to be done at this point. There is no evidence of acute coronary syndrome. Chest pain likely due to demand ischemia from profound microcytic anemia in the setting of known AVMs.   I have spoken with Dr. Madilyn Fireman about this and suggested that, given the degree of her anemia and associated symptoms, I think she needs repeat endoscopy sooner rather than later. He has deferred endoscopy at this time pending other work-up as he said she has had multiple recent endoscopies in [place] without an acute source of bleeding. I have informed him that I would like a second opinion and would like [facility] to assume her care as all her other care is provided through the [facility] service.  I have discussed the case with the patient's daughter, Efraim Kaufmann, whom I know well and she independently expressed frustration about the lack of repeat endoscopy and would like a second opinion. I will contact the [facility] service.   I appreciate Dr. Madilyn Fireman' time and effort.   Kirstan Fentress,MD 3:29 PM

## 2012-04-24 NOTE — Progress Notes (Signed)
Upon administering unit of RBCs, ran into a caution stop when verifying blood in computer that stated the bag was linked to another patient.  Blood bank finally advised me to override the caution as this is bag#2 of a set of RBCs.  Transfusion started at 0025, despite the earlier time column as we could not complete the scanning/verification at that time.  Will continue to monitor.  [name]

## 2012-04-25 DIAGNOSIS — K5521 Angiodysplasia of colon with hemorrhage: Secondary | ICD-10-CM

## 2012-04-25 LAB — CBC
MCV: 77.8 fL — ABNORMAL LOW (ref 78.0–100.0)
Platelets: 243 10*3/uL (ref 150–400)
RBC: 3.97 MIL/uL (ref 3.87–5.11)
WBC: 12.3 10*3/uL — ABNORMAL HIGH (ref 4.0–10.5)

## 2012-04-25 LAB — IRON AND TIBC
Iron: 800 ug/dL — ABNORMAL HIGH (ref 42–135)
UIBC: <15 ug/dL — ABNORMAL LOW (ref 125–400)

## 2012-04-25 LAB — BASIC METABOLIC PANEL
CO2: 32 mEq/L (ref 19–32)
Calcium: 8.8 mg/dL (ref 8.4–10.5)
Chloride: 107 mEq/L (ref 96–112)
Potassium: 4.1 mEq/L (ref 3.5–5.1)
Sodium: 145 mEq/L (ref 135–145)

## 2012-04-25 MED ORDER — DIPHENHYDRAMINE HCL 50 MG/ML IJ SOLN
INTRAMUSCULAR | Status: AC
  Filled 2012-04-25: qty 1

## 2012-04-25 MED ORDER — MAGNESIUM HYDROXIDE 400 MG/5ML PO SUSP
30.0000 mL | Freq: Every day | ORAL | Status: DC | PRN
  Administered 2012-04-25: 30 mL via ORAL
  Filled 2012-04-25: qty 30

## 2012-04-25 MED ORDER — DIPHENHYDRAMINE HCL 50 MG/ML IJ SOLN
25.0000 mg | Freq: Once | INTRAMUSCULAR | Status: AC
Start: 2012-04-25 — End: 2012-04-25
  Administered 2012-04-25: 25 mg via INTRAVENOUS

## 2012-04-25 MED ORDER — LACTULOSE 10 GM/15ML PO SOLN
30.0000 g | Freq: Every day | ORAL | Status: DC | PRN
  Administered 2012-04-25 – 2012-04-26 (×2): 30 g via ORAL
  Filled 2012-04-25 (×3): qty 45

## 2012-04-25 MED ORDER — FUROSEMIDE 10 MG/ML IJ SOLN
20.0000 mg | Freq: Once | INTRAMUSCULAR | Status: AC
  Administered 2012-04-25: 20 mg via INTRAVENOUS

## 2012-04-25 NOTE — Progress Notes (Signed)
Patient's prn dose of Lactulose was effective.  Patient had a bowel movement. Will continue to monitor.

## 2012-04-25 NOTE — Progress Notes (Signed)
PCCM Progress Note  CC: Dyspnea and Chest Pain  HPI 79 former smoker, dx with COPD about 1999, also hx breast CA, CVA with R frontal AVM, colonic AVM's that have ben associated in the past with occult anemia and required cautery. Also diastolic dysfxn and single vessel CAD followed by Dr Gala Romney. Has been treated with Spiriva + Symbicort for her COPD. She has been on chronic prednisone tapering x  several months, currently on 5mg  qd after recent acute exacerbation in June 2013.   Admitted 8/8 with exertional CP and dyspnea.   Subjective: Better p receiving 3/4 units of prbcs, starting the last one now, no sob or desat on 3lpm, her norm  Objective: Temp:  [97 F (36.1 C)-98.8 F (37.1 C)] 97.6 F (36.4 C) (08/10 1045) Pulse Rate:  [76-93] 78  (08/10 1045) Resp:  [16-20] 20  (08/10 1045) BP: (116-138)/(45-72) 124/58 mmHg (08/10 1045) SpO2:  [93 %-100 %] 93 % (08/10 0949) Weight:  [164 lb 7.4 oz (74.6 kg)] 164 lb 7.4 oz (74.6 kg) (08/10 0602) FI02  3lpm  Intake/Output Summary (Last 24 hours) at 04/25/12 1153 Last data filed at 04/25/12 0945  Gross per 24 hour  Intake 1257.5 ml  Output      1 ml  Net 1256.5 ml   GEN: A/Ox3; pleasant , NAD, elderly female   HEENT:  [place]/AT,  EACs-clear, TMs-wnl, NOSE-clear, THROAT-clear, no lesions, no postnasal drip or exudate noted.   NECK:  Supple w/ fair ROM; no JVD; normal carotid impulses w/o bruits; no thyromegaly or nodules palpated; no lymphadenopathy.  RESP  Coarse BS at bases, no wheeze, good air movement  CARD:  RRR, Gr 1/6 SM, 1+ ankle edema, pulses intact, no cyanosis or clubbing.  Musco: Warm bil, no deformities or joint swelling noted.   Neuro: alert, no focal deficits noted.    Skin: Warm, no lesions or rashes   Lab 04/25/12 0520 04/24/12 2012 04/24/12 0300  HGB 9.2* 8.7* 6.7*  HCT 30.9* 28.7* 23.2*  WBC 12.3* 11.1* 9.2  PLT 243 233 222    Lab 04/25/12 0520 04/24/12  0300 04/23/12 1955  NA 145 146* 140  K 4.1 3.5 --  CL 107 105 99  CO2 32 34* 34*  GLUCOSE 119* 129* 80  BUN 13 12 14   CREATININE 0.48* 0.54 0.52  CALCIUM 8.8 8.5 8.8  MG -- -- 2.4  PHOS -- -- 3.8      Lab 04/24/12 1036 04/24/12 0300 04/23/12 1955  TROPONINI <0.30 <0.30 <0.30   Portable Chest 1 View  04/23/2012  *RADIOLOGY REPORT*  Clinical Data: Chest pain  PORTABLE CHEST - 1 VIEW  Comparison: 03/06/2012  Findings: Background COPD/emphysema noted.  Stable cardiomegaly without CHF or pneumonia.  No effusion, collapse, consolidation, pneumothorax.  Atherosclerosis of the aorta.  Trachea is midline.  IMPRESSION: Stable COPD pattern.  No superimposed acute process  Original Report Authenticated By: Judie Petit. Ruel Favors, M.D.    Assessment & Plan: COPD Appears to be stable at this time, without evidence acute exacerbation.  - continue pred 5mg  qd - continue O2 + Spiriva + Symbicort  HYPOTHYROIDISM, TSH 0.39 -continue home synthroid  HYPERTENSION - continue home dilt -   increased lasix on admission to 40mg  bid x 3 doses given her edema on admission, then likely back to her home dose 20mg  qd  CAD, NATIVE VESSEL Presenting complaints very concerning for unstable angina, now with evidence that this is demand ischemia pattern due to her anemia.  -  ASA increased from 81 to 325mg  but watch for gib - Appreciate Dr Bensimhon's evaluation. If anginal sx continue after anemia treated then probable cath. If CP resolves then would likely be able to perform nuclear stress test.   GERD (gastroesophageal reflux disease) -Continue PPI  HX OF AVM'S AND MICROCYTIC ANEMIA, INR 0.92 Recurrent anemia confirmed, Hgb 6.2 on arrival 8/8 >> 6.7 after 1u PRBC. She has had multiple CSY in past, followed by Dr Mechele Collin in [place], has been cauterized before.  -transfuse PRBC 1u and follow; I would like to get Hgb > 8.0 in presence possible ischemia -consult GI for ? CSY vs bleeding scan, etc.  -iron panel  pending  Sandrea Hughs, MD Pulmonary and Critical Care Medicine [facility] [phone]

## 2012-04-25 NOTE — Progress Notes (Signed)
Pt's IV in left forearm infiltrated halfway during iron infusion. New IV placed, and iron restarted. Will continue to monitor.  Alfonso Ellis, RN

## 2012-04-25 NOTE — Progress Notes (Addendum)
Subjective:   [name] is a delightful 78 year old woman with a history of COPD on home O2, hypertension, hyperlipidemia, GIB due to AVMs and peripheral arterial disease. She is status post multiple CVAs in the setting of a previous left carotid stenosis, which she has underwent endarterectomy before at [facility] in 2005.   She denies any history of known MI. She does have an abnormal ECG with inferior and lateral Q waves but Myoview and echo have been normal. Cath January 2012 showed Mild non-obs CAD with ~50% ostial RCA lesion. Left system normal. EF 70%. Mild PAH. Right atrial pressure mean of 8, RV pressure 43/6 with an EDP of 14, PA pressure 42/17 with a mean of 29, pulmonary capillary wedge pressure was mean of 15.   Saw Dr. Delton Coombes on Thrusday and c/o exertional chest pressure of past week and a half. Happens every times she walks particularly if she is talking at same. No radiation. No change in respiratory status. No resting CP.   Admitted from Dr. Kavin Leech office for possible cath. Hgb found to be 6.2. Transfused. Cardiac enzymes have been normal.   Seen by Dr. Juanda Chance in GI. Plan will be to give IV iron and to watch closely as an outpatient with frequent monitoring of CBC with transfusions as need. Another unit of blood has been ordered today with some IV lasix.  No further CP. Feels like she is having a bit of wheezing today.    Intake/Output Summary (Last 24 hours) at 04/25/12 0715 Last data filed at 04/24/12 2123  Gross per 24 hour  Intake   1605 ml  Output      1 ml  Net   1604 ml    Current meds:    . antiseptic oral rinse  15 mL Mouth Rinse BID  . aspirin EC  325 mg Oral Daily  . atorvastatin  20 mg Oral Daily  . budesonide-formoterol  2 puff Inhalation BID  . diltiazem  120 mg Oral Daily  . fesoterodine  4 mg Oral Daily  . furosemide  20 mg Oral Daily  . iron dextran (INFED/DEXFERRUM) infusion  1,000 mg Intravenous Once  . iron dextran (INFED/DEXFERRUM) infusion  25  mg Intravenous Once  . levothyroxine  112 mcg Oral Daily  . levothyroxine  50 mcg Oral Daily  . loratadine  10 mg Oral Daily  . magnesium hydroxide  30 mL Oral Once  . multivitamin with minerals  1 tablet Oral Daily  . pantoprazole  40 mg Oral BID  . predniSONE  5 mg Oral Daily  . sodium chloride  3 mL Intravenous Q12H  . sodium chloride  3 mL Intravenous Q12H  . tiotropium  18 mcg Inhalation Daily  . DISCONTD: furosemide  40 mg Oral BID  . DISCONTD: iron dextran (INFED/DEXFERRUM) infusion  1,000 mg Intravenous Once   Infusions:     Objective:  Blood pressure 128/72, pulse 76, temperature 98.3 F (36.8 C), temperature source Oral, resp. rate 18, height 5\' 6"  (1.676 m), weight 74.6 kg (164 lb 7.4 oz), SpO2 98.00%. Weight change: 0.165 kg (5.8 oz)  Physical Exam: General: Elderly. No acute distress. On O2  HEENT: normal  Neck: supple. no JVD. Carotids 2+ bilat; + bilateral bruits. No lymphadenopathy or thryomegaly appreciated.  Cor: PMI nondisplaced. Regular rate & rhythm. No rubs, gallops. 3/6 AS murmur. S2 preserved  Lungs: poor arm ovementthroughout  Abdomen: soft, nontender, nondistended. No hepatosplenomegaly. No bruits or masses. Good bowel sounds.  Extremities: no  cyanosis, clubbing, rash, edema  Neuro: alert & oriented x 3, cranial nerves grossly intact. moves all 4 extremities w/o difficulty. Affect pleasant.   Telemetry: Sinus rhythm  Lab Results: Basic Metabolic Panel:  Lab 04/25/12 9562 04/24/12 0300 04/23/12 1955  NA 145 146* 140  K 4.1 3.5 --  CL 107 105 99  CO2 32 34* 34*  GLUCOSE 119* 129* 80  BUN 13 12 14   CREATININE 0.48* 0.54 0.52  CALCIUM 8.8 8.5 8.8  MG -- -- 2.4  PHOS -- -- 3.8   Liver Function Tests: No results found for this basename: AST:5,ALT:5,ALKPHOS:5,BILITOT:5,PROT:5,ALBUMIN:5 in the last 168 hours No results found for this basename: LIPASE:5,AMYLASE:5 in the last 168 hours No results found for this basename: AMMONIA:5 in the last 168  hours CBC:  Lab 04/25/12 0520 04/24/12 2012 04/24/12 0300 04/23/12 1955  WBC 12.3* 11.1* 9.2 10.5  NEUTROABS -- -- -- --  HGB 9.2* 8.7* 6.7* 6.2*  HCT 30.9* 28.7* 23.2* 21.9*  MCV 77.8* 76.7* 75.3* 74.0*  PLT 243 233 222 271   Cardiac Enzymes:  Lab 04/24/12 1036 04/24/12 0300 04/23/12 1955  CKTOTAL 41 39 55  CKMB 2.1 2.2 2.6  CKMBINDEX -- -- --  TROPONINI <0.30 <0.30 <0.30   BNP: No components found with this basename: POCBNP:5 CBG: No results found for this basename: GLUCAP:5 in the last 168 hours Microbiology: No results found for this basename: cult   No results found for this basename: CULT:2,SDES:2 in the last 168 hours  Imaging: Portable Chest 1 View  04/23/2012  *RADIOLOGY REPORT*  Clinical Data: Chest pain  PORTABLE CHEST - 1 VIEW  Comparison: 03/06/2012  Findings: Background COPD/emphysema noted.  Stable cardiomegaly without CHF or pneumonia.  No effusion, collapse, consolidation, pneumothorax.  Atherosclerosis of the aorta.  Trachea is midline.  IMPRESSION: Stable COPD pattern.  No superimposed acute process  Original Report Authenticated By: Judie Petit. Ruel Favors, M.D.     ASSESSMENT:  1. CP  --known abnormal ECGs with chronic inferior/lateral Qs  2. Non-obstructive CAD on cath 1/12  3. Iron def anemia (microcytic)  4. Known AVMs  5. COPD, O2 dependent  6. PAD  7. AS murmur - echo from 2/13 with mild to moderate LVOT gradient   PLAN/DISCUSSION:  CP has resolved with transfusion. Appreciate Dr. Regino Schultze thoughtful approach. I think this is very reasonable plan for her. She will get another unit of RBCs today with some IV lasix.  We will have her ambulate today. She will need further risk stratification for her CAD. If has recurrent CP even after transfusion will consider cath on Monday knowing that she is not a candidate for DES due to AVMs but we might be able to get her through a month of DAPT with a bare metal stent but would be nice to make sure her hgb is  "topped off" and iron stores are stocked prior to attempting this. If CP resolved completely I would favor inpatient Dobutamine Myoview likely tomorrow. Will see how she does with ambulation.    LOS: 2 days    Arvilla Meres, MD 04/25/2012, 7:15 AM

## 2012-04-25 NOTE — Progress Notes (Signed)
Subjective No complaints, slept well  Objective: Hgb 9.2,  Received Iron infusion,  No stools Vital signs in last 24 hours: Temp:  [97 F (36.1 C)-98.8 F (37.1 C)] 98.3 F (36.8 C) (08/10 0602) Pulse Rate:  [76-93] 76  (08/10 0602) Resp:  [16-18] 18  (08/10 0602) BP: (116-138)/(45-72) 128/72 mmHg (08/10 0602) SpO2:  [96 %-100 %] 98 % (08/10 0602) Weight:  [164 lb 7.4 oz (74.6 kg)] 164 lb 7.4 oz (74.6 kg) (08/10 0602) Last BM Date: 04/20/12 General:   Alert,  pleasant, cooperative in NAD Head:  Normocephalic and atraumatic. Eyes:  Sclera clear, no icterus.   Conjunctiva pink. Mouth:  No deformity or lesions, dentition normal. Neck:  Supple; no masses or thyromegaly. Heart:  Regular rate and rhythm; 3/6 murmurs, clicks, rubs,  or gallops. Lungs:  Few exp. wheezes or rales Abdomen:  Soft , non tender  Msk:  Symmetrical without gross deformities. Normal posture. Pulses:  Normal pulses noted. Extremities:  Without clubbing or edema. Neurologic:  Alert and  oriented x4;  grossly normal neurologically. Skin:  Intact without significant lesions or rashes.  Intake/Output from previous day: 08/09 0701 - 08/10 0700 In: 1605 [P.O.:960; Blood:645] Out: 1 [Urine:1] Intake/Output this shift:    Lab Results:  Basename 04/25/12 0520 04/24/12 2012 04/24/12 0300  WBC 12.3* 11.1* 9.2  HGB 9.2* 8.7* 6.7*  HCT 30.9* 28.7* 23.2*  PLT 243 233 222   BMET  Basename 04/25/12 0520 04/24/12 0300 04/23/12 1955  NA 145 146* 140  K 4.1 3.5 3.5  CL 107 105 99  CO2 32 34* 34*  GLUCOSE 119* 129* 80  BUN 13 12 14   CREATININE 0.48* 0.54 0.52  CALCIUM 8.8 8.5 8.8   LFT No results found for this basename: PROT,ALBUMIN,AST,ALT,ALKPHOS,BILITOT,BILIDIR,IBILI in the last 72 hours PT/INR  Basename 04/23/12 1955  LABPROT 12.6  INR 0.92   Hepatitis Panel No results found for this basename: HEPBSAG,HCVAB,HEPAIGM,HEPBIGM in the last 72 hours  Studies/Results: Portable Chest 1 View  04/23/2012   *RADIOLOGY REPORT*  Clinical Data: Chest pain  PORTABLE CHEST - 1 VIEW  Comparison: 03/06/2012  Findings: Background COPD/emphysema noted.  Stable cardiomegaly without CHF or pneumonia.  No effusion, collapse, consolidation, pneumothorax.  Atherosclerosis of the aorta.  Trachea is midline.  IMPRESSION: Stable COPD pattern.  No superimposed acute process  Original Report Authenticated By: Judie Petit. Ruel Favors, M.D.     ASSESSMENT:   Principal Problem:  *Exertional angina Active Problems:  HYPOTHYROIDISM  HYPERTENSION  CAD, NATIVE VESSEL  COPD  DYSPNEA  HYPOTHYROIDISM  Anemia  GERD (gastroesophageal reflux disease)  Chest pain on exertion  CAD (coronary artery disease)  Microcytic anemia  Angiodysplasia of intestine with hemorrhage  Nonspecific abnormal finding in stool contents     PLAN:   Stable, hemodynamically, will give 1 more unit of pRbc's ( plus Lasix IV) Recheck CBC in am  May be d/c'd tomorrow Appointment with Dr Ethelda Chick in [place] on 05/01/2012, will have CBC checked at that time [phone]) My office will monitor her CBC closely ( q 2 weeks if necessary) I will set up future Iron infusions through my office,if she has overt bleeding will re-endoscope     LOS: 2 days   [name]  04/25/2012, 7:17 AM

## 2012-04-25 NOTE — Progress Notes (Addendum)
04/25/2012 1200 Nursing note Upon assessment for hourly vital signs during infusion, pt. Noted to have slight temperature of 99.2. Pt. Also c/o runny nose. Pt. States she does have severe seasonal allergies and does not feel that this is any change from previous. She states she has had a runny nose off and on for several days. Blood infusion stopped per protocol. Dr. Juanda Chance paged and made aware. Orders received and enacted. Blood transfusion d/c per verbal orders by Dr. Juanda Chance. Upon re-asses ment, temperature noted at 98.8. Dr. Juanda Chance updated. This will not be worked up as transfusion reaction per MD. Blood discarded of per Blood bank protocol.  Will continue to closely monitor patient.  Jakyron Fabro, Blanchard Kelch

## 2012-04-26 LAB — TYPE AND SCREEN
ABO/RH(D): O POS
Antibody Screen: NEGATIVE
Unit division: 0
Unit division: 0
Unit division: 0
Unit division: 0

## 2012-04-26 LAB — CBC
HCT: 32.9 % — ABNORMAL LOW (ref 36.0–46.0)
Hemoglobin: 9.9 g/dL — ABNORMAL LOW (ref 12.0–15.0)
MCV: 79.7 fL (ref 78.0–100.0)
RBC: 4.13 MIL/uL (ref 3.87–5.11)
WBC: 11.9 10*3/uL — ABNORMAL HIGH (ref 4.0–10.5)

## 2012-04-26 MED ORDER — ASPIRIN EC 81 MG PO TBEC
81.0000 mg | DELAYED_RELEASE_TABLET | Freq: Every day | ORAL | Status: DC
  Administered 2012-04-27 – 2012-04-28 (×2): 81 mg via ORAL
  Filled 2012-04-26 (×2): qty 1

## 2012-04-26 NOTE — Progress Notes (Addendum)
Subjective:   [name] is a delightful 78 year old woman with a history of COPD on home O2, hypertension, hyperlipidemia, GIB due to AVMs and peripheral arterial disease. She is status post multiple CVAs in the setting of a previous left carotid stenosis, which she has underwent endarterectomy before at [facility] in 2005.   She denies any history of known MI. She does have an abnormal ECG with inferior and lateral Q waves but Myoview and echo have been normal. Cath January 2012 showed Mild non-obs CAD with ~50% ostial RCA lesion. Left system normal. EF 70%. Mild PAH. Right atrial pressure mean of 8, RV pressure 43/6 with an EDP of 14, PA pressure 42/17 with a mean of 29, pulmonary capillary wedge pressure was mean of 15.   Saw Dr. Delton Coombes on Thrusday and c/o exertional chest pressure of past week and a half. Happens every times she walks particularly if she is talking at same. No radiation. No change in respiratory status. No resting CP.   Admitted from Dr. Kavin Leech office for possible cath. Hgb found to be 6.2. Transfused. Cardiac enzymes have been normal.   Seen by Dr. Juanda Chance in GI. Plan will be to give IV iron and to watch closely as an outpatient with frequent monitoring of CBC with transfusions as need.   HgB now ~ 10. Ambulating without CP. Dobutamine Myoview this am: Report read as EF 40% with very small reversible defect in distal anterior wall. Extensive inferior wall artifact.    No intake or output data in the 24 hours ending 04/26/12 1100  Current meds:    . antiseptic oral rinse  15 mL Mouth Rinse BID  . aspirin EC  325 mg Oral Daily  . atorvastatin  20 mg Oral Daily  . budesonide-formoterol  2 puff Inhalation BID  . diltiazem  120 mg Oral Daily  . diphenhydrAMINE      . diphenhydrAMINE  25 mg Intravenous Once  . fesoterodine  4 mg Oral Daily  . furosemide  20 mg Oral Daily  . levothyroxine  112 mcg Oral Daily  . levothyroxine  50 mcg Oral Daily  . loratadine  10 mg Oral  Daily  . multivitamin with minerals  1 tablet Oral Daily  . pantoprazole  40 mg Oral BID  . predniSONE  5 mg Oral Daily  . sodium chloride  3 mL Intravenous Q12H  . sodium chloride  3 mL Intravenous Q12H  . tiotropium  18 mcg Inhalation Daily   Infusions:     Objective:  Blood pressure 126/66, pulse 77, temperature 98.3 F (36.8 C), temperature source Oral, resp. rate 18, height 5\' 6"  (1.676 m), weight 75.751 kg (167 lb), SpO2 93.00%. Weight change: 1.151 kg (2 lb 8.6 oz)  Physical Exam: General: Elderly. No acute distress. On O2  HEENT: normal  Neck: supple. no JVD. Carotids 2+ bilat; + bilateral bruits. No lymphadenopathy or thryomegaly appreciated.  Cor: PMI nondisplaced. Regular rate & rhythm. No rubs, gallops. 3/6 AS murmur. S2 preserved  Lungs: poor arm ovementthroughout  Abdomen: soft, nontender, nondistended. No hepatosplenomegaly. No bruits or masses. Good bowel sounds.  Extremities: no cyanosis, clubbing, rash, edema  Neuro: alert & oriented x 3, cranial nerves grossly intact. moves all 4 extremities w/o difficulty. Affect pleasant.   Telemetry: Sinus rhythm  Lab Results: Basic Metabolic Panel:  Lab 04/25/12 4010 04/24/12 0300 04/23/12 1955  NA 145 146* 140  K 4.1 3.5 --  CL 107 105 99  CO2 32 34* 34*  GLUCOSE 119* 129* 80  BUN 13 12 14   CREATININE 0.48* 0.54 0.52  CALCIUM 8.8 8.5 8.8  MG -- -- 2.4  PHOS -- -- 3.8   Liver Function Tests: No results found for this basename: AST:5,ALT:5,ALKPHOS:5,BILITOT:5,PROT:5,ALBUMIN:5 in the last 168 hours No results found for this basename: LIPASE:5,AMYLASE:5 in the last 168 hours No results found for this basename: AMMONIA:5 in the last 168 hours CBC:  Lab 04/26/12 0611 04/25/12 0520 04/24/12 2012 04/24/12 0300 04/23/12 1955  WBC 11.9* 12.3* 11.1* 9.2 10.5  NEUTROABS -- -- -- -- --  HGB 9.9* 9.2* 8.7* 6.7* 6.2*  HCT 32.9* 30.9* 28.7* 23.2* 21.9*  MCV 79.7 77.8* 76.7* 75.3* 74.0*  PLT 262 243 233 222 271    Cardiac Enzymes:  Lab 04/24/12 1036 04/24/12 0300 04/23/12 1955  CKTOTAL 41 39 55  CKMB 2.1 2.2 2.6  CKMBINDEX -- -- --  TROPONINI <0.30 <0.30 <0.30   BNP: No components found with this basename: POCBNP:5 CBG: No results found for this basename: GLUCAP:5 in the last 168 hours Microbiology: No results found for this basename: cult   No results found for this basename: CULT:2,SDES:2 in the last 168 hours  Imaging: No results found.   ASSESSMENT:  1. CP  --known abnormal ECGs with chronic inferior/lateral Qs  2. Non-obstructive CAD on cath 1/12  3. Iron def anemia (microcytic)  4. Known AVMs  5. COPD, O2 dependent  6. PAD  7. AS murmur - echo from 2/13 with mild to moderate LVOT gradient   PLAN/DISCUSSION:   CP has resolved. I have reviewed Myoview personally. Poor quality study with extensive gut artifact.  Perfusion looks good. Distal anterior wall defect not convincing for ischemia and LAD was normal on cath just a year and a a half ago.No significant RCA ischemia. Visually EF looks much better than 40%.  I think we can safely discharge her home from a cardiac standpoint. Agree with close f/u of Hgb and transfusions as necessary. We will see her back in clinic in 2 weeks with an echo to reassess EF. (Sept 5, 2013 @ 11a in HF clinic)  Continue ASA 81 and statin. No b-blocker due to severe lung disease.   LOS: 3 days    Arvilla Meres, MD 04/26/2012, 11:00 AM

## 2012-04-26 NOTE — Progress Notes (Signed)
PCCM Progress Note  CC: Dyspnea and Chest Pain  HPI 79 former smoker, dx with COPD about 1999, also hx breast CA, CVA with R frontal AVM, colonic AVM's that have ben associated in the past with occult anemia and required cautery. Also diastolic dysfxn and single vessel CAD followed by Dr Gala Romney. Has been treated with Spiriva + Symbicort for her COPD. She has been on chronic prednisone tapering x  several months, currently on 5mg  qd after recent acute exacerbation in June 2013.   Admitted 8/8 with exertional CP and dyspnea.   Subjective: Better p completion of 4units prbc's 8/11,    no sob or desat on 3lpm, her norm  Objective: Temp:  [97.6 F (36.4 C)-98.3 F (36.8 C)] 98.3 F (36.8 C) (08/11 0449) Pulse Rate:  [72-96] 77  (08/11 0944) Resp:  [18-20] 18  (08/11 0449) BP: (126-140)/(49-75) 126/66 mmHg (08/11 0944) SpO2:  [93 %-96 %] 93 % (08/11 0901) Weight:  [167 lb (75.751 kg)] 167 lb (75.751 kg) (08/11 0449)   FI02  3lpm  GEN: A/Ox3; pleasant , NAD, elderly female   HEENT:  [place]/AT,  EACs-clear, TMs-wnl, NOSE-clear, THROAT-clear, no lesions, no postnasal drip or exudate noted.   NECK:  Supple w/ fair ROM; no JVD; normal carotid impulses w/o bruits; no thyromegaly or nodules palpated; no lymphadenopathy.  RESP  Coarse BS at bases, no wheeze, good air movement  CARD:  RRR, Gr 1/6 SM, 1+ ankle edema, pulses intact, no cyanosis or clubbing.  Musco: Warm bil, no deformities or joint swelling noted.   Neuro: alert, no focal deficits noted.    Skin: Warm, no lesions or rashes   Lab 04/26/12 0611 04/25/12 0520 04/24/12 2012  HGB 9.9* 9.2* 8.7*  HCT 32.9* 30.9* 28.7*  WBC 11.9* 12.3* 11.1*  PLT 262 243 233    Lab 04/25/12 0520 04/24/12 0300 04/23/12 1955  NA 145 146* 140  K 4.1 3.5 --  CL 107 105 99  CO2 32 34* 34*  GLUCOSE 119* 129* 80  BUN 13 12 14   CREATININE 0.48* 0.54 0.52  CALCIUM 8.8 8.5 8.8  MG -- -- 2.4    PHOS -- -- 3.8      Lab 04/24/12 1036 04/24/12 0300 04/23/12 1955  TROPONINI <0.30 <0.30 <0.30   No results found.  Assessment & Plan: COPD Appears to be stable at this time, without evidence acute exacerbation.  - continue pred 5mg  qd - continue O2 + Spiriva + Symbicort  HYPOTHYROIDISM, TSH 0.39 -continue home synthroid  HYPERTENSION - continue home dilt -   increased lasix on admission to 40mg  bid x 3 doses     CAD, NATIVE VESSEL Presenting complaints very concerning for unstable angina, now with evidence that this is demand ischemia pattern due to her anemia.  - ASA increased from 81 to 325mg  but watch for gib - Appreciate Dr Bensimhon's evaluation. > for stress test am 8/12 then ? Home?  GERD (gastroesophageal reflux disease) -Continue PPI  HX OF AVM'S AND MICROCYTIC ANEMIA, INR 0.92 Recurrent anemia confirmed, Hgb 6.2 on arrival 8/8 >> 6.7 after 1u PRBC. She has had multiple CSY in past, followed by Dr Mechele Collin in [place], has been cauterized before.  -transfuse PRBC 1u and follow; I would like to get Hgb > 8.0 in presence possible ischemia -consult GI for ? CSY vs bleeding scan, etc.  - Ferritin 11 8/10    Sandrea Hughs, MD Pulmonary and Critical Care Medicine [facility] Cell [phone]

## 2012-04-26 NOTE — Progress Notes (Signed)
04/26/2012 1700 Late entry Nursing note Pt. Ambulated around unit 350 ft. At end of walk, pt. Started to complain of slight chest pressure and mild SOB. This was not "near the severity of the initial attack" that brought her into hospital per patient. Pt. Instructed to sit in chair and rest. Pressure immediately relieved with rest. Pt. Encouraged to promptly notify RN if pressure returned so that EKG could be performed. Pt. Verbalized understanding. Will continue to closely monitor patient.  Sullivan Jacuinde, Blanchard Kelch

## 2012-04-26 NOTE — Progress Notes (Signed)
Subjective Feeling fine, ambulating in the room, had a large BM, feeling better,  Objective:Hgb up to 9.9 after 3 units of pRBC's, denies any chect pain on exertion Vital signs in last 24 hours: Temp:  [97.6 F (36.4 C)-99.2 F (37.3 C)] 98.3 F (36.8 C) (08/11 0449) Pulse Rate:  [72-96] 72  (08/11 0449) Resp:  [18-20] 18  (08/11 0449) BP: (124-149)/(49-78) 129/69 mmHg (08/11 0449) SpO2:  [93 %-97 %] 93 % (08/11 0901) Weight:  [167 lb (75.751 kg)] 167 lb (75.751 kg) (08/11 0449) Last BM Date: 04/25/12 (pt. states stool was very hard, request prn daily laxative) General:   Alert,  pleasant, cooperative in NAD Neck:  Supple; no masses or thyromegaly. Heart:  Regular rate and rhythm; 2/6 sys murmurs,   Lungs:  No wheezes or rales Abdomen: soft non tender  Msk:  Symmetrical without gross deformities. Normal posture. Pulses:  Normal pulses noted. Extremities:  Without clubbing or edema. Neurologic:  Alert and  oriented x4;  grossly normal neurologically. Skin:  Intact without significant lesions or rashes.  Intake/Output from previous day: 08/10 0701 - 08/11 0700 In: 12.5 [Blood:12.5] Out: -  Intake/Output this shift:    Lab Results:  Basename 04/26/12 0611 04/25/12 0520 04/24/12 2012  WBC 11.9* 12.3* 11.1*  HGB 9.9* 9.2* 8.7*  HCT 32.9* 30.9* 28.7*  PLT 262 243 233   BMET  Basename 04/25/12 0520 04/24/12 0300 04/23/12 1955  NA 145 146* 140  K 4.1 3.5 3.5  CL 107 105 99  CO2 32 34* 34*  GLUCOSE 119* 129* 80  BUN 13 12 14   CREATININE 0.48* 0.54 0.52  CALCIUM 8.8 8.5 8.8   LFT No results found for this basename: PROT,ALBUMIN,AST,ALT,ALKPHOS,BILITOT,BILIDIR,IBILI in the last 72 hours PT/INR  Basename 04/23/12 1955  LABPROT 12.6  INR 0.92   Hepatitis Panel No results found for this basename: HEPBSAG,HCVAB,HEPAIGM,HEPBIGM in the last 72 hours  Studies/Results: No results found.   ASSESSMENT:   Principal Problem:  *Exertional angina Active Problems:  HYPOTHYROIDISM  HYPERTENSION  CAD, NATIVE VESSEL  COPD  DYSPNEA  HYPOTHYROIDISM  Anemia  GERD (gastroesophageal reflux disease)  Chest pain on exertion  CAD (coronary artery disease)  Microcytic anemia  Angiodysplasia of intestine with hemorrhage  Nonspecific abnormal finding in stool contents     PLAN:   Chronic GIB from avm's, Hgb up to 9.9, no chest pain, cardiology to decide if to proceed with stress scan tomorrow, otherwise she may be discharged from GI standpoint. We have made arrangements for her follow up.     LOS: 3 days   [name]  04/26/2012, 9:40 AM

## 2012-04-27 ENCOUNTER — Telehealth: Payer: Self-pay | Admitting: *Deleted

## 2012-04-27 ENCOUNTER — Inpatient Hospital Stay (HOSPITAL_COMMUNITY): Payer: Medicare Other

## 2012-04-27 ENCOUNTER — Other Ambulatory Visit (HOSPITAL_COMMUNITY): Payer: Self-pay | Admitting: Adult Health

## 2012-04-27 DIAGNOSIS — D649 Anemia, unspecified: Secondary | ICD-10-CM

## 2012-04-27 DIAGNOSIS — R079 Chest pain, unspecified: Secondary | ICD-10-CM

## 2012-04-27 DIAGNOSIS — I2510 Atherosclerotic heart disease of native coronary artery without angina pectoris: Secondary | ICD-10-CM

## 2012-04-27 LAB — GLIADIN ANTIBODIES, SERUM: Gliadin IgG: 2.1 U/mL (ref <20)

## 2012-04-27 LAB — CBC
HCT: 34.6 % — ABNORMAL LOW (ref 36.0–46.0)
Hemoglobin: 10.3 g/dL — ABNORMAL LOW (ref 12.0–15.0)
MCH: 23.9 pg — ABNORMAL LOW (ref 26.0–34.0)
MCHC: 29.8 g/dL — ABNORMAL LOW (ref 30.0–36.0)

## 2012-04-27 MED ORDER — TECHNETIUM TC 99M TETROFOSMIN IV KIT
30.0000 | PACK | Freq: Once | INTRAVENOUS | Status: AC | PRN
  Administered 2012-04-27: 30 via INTRAVENOUS

## 2012-04-27 MED ORDER — DOBUTAMINE HCL 250 MG/20ML IV SOLN
10.0000 ug/kg | INTRAVENOUS | Status: DC
  Administered 2012-04-27: 10 ug/kg/min via INTRAVENOUS
  Filled 2012-04-27: qty 12

## 2012-04-27 MED ORDER — DOBUTAMINE INFUSION FOR EP/ECHO/NUC (1000 MCG/ML)
INTRAVENOUS | Status: AC
  Filled 2012-04-27: qty 250

## 2012-04-27 MED ORDER — ATROPINE SULFATE 0.1 MG/ML IJ SOLN
INTRAMUSCULAR | Status: AC
  Filled 2012-04-27: qty 10

## 2012-04-27 MED ORDER — TECHNETIUM TC 99M TETROFOSMIN IV KIT
10.0000 | PACK | Freq: Once | INTRAVENOUS | Status: AC | PRN
  Administered 2012-04-27: 10 via INTRAVENOUS

## 2012-04-27 NOTE — Telephone Encounter (Signed)
Message copied by Richardson Chiquito on Mon Apr 27, 2012 10:15 AM ------      Message from: Hart Carwin      Created: Sun Apr 26, 2012  9:58 PM      Regarding: follow CBC       Dotti, could you, please, call this pt's new PCP in [facility] # [phone], Dr Ethelda Chick. Pt has a first appointment with her on Friday, Aug 16 and she will need CBC. She lives 10 miles from her office and it is more convenient to go there. Pt has chronic GIB from AVM's and will need frequent CBC there, which will need to be faxed to [place] immediately so we can decide if and when she needs the next [facility] and/or Iron infusion. THANX a lot!! DB  AVM's

## 2012-04-27 NOTE — Clinical Documentation Improvement (Signed)
Anemia Blood Loss Clarification  THIS DOCUMENT IS NOT A PERMANENT PART OF THE MEDICAL RECORD  RESPOND TO THE THIS QUERY, FOLLOW THE INSTRUCTIONS BELOW:  1. If needed, update documentation for the patient's encounter via the notes activity.  2. Access this query again and click edit on the In [employer].  3. After updating, or not, click F2 to complete all highlighted (required) fields concerning your review. Select "additional documentation in the medical record" OR "no additional documentation provided".  4. Click Sign note button.  5. The deficiency will fall out of your In Basket *Please let us know if you are not able to complete this workflow by phone or e-mail (listed below).        04/27/12  Dear Darrell Jewel Marton [place]  In an effort to better capture your patient's severity of illness, reflect appropriate length of stay and utilization of resources, a review of the patient medical record has revealed the following indicators.    Based on your clinical judgment, please clarify and document in a progress note and/or discharge summary the clinical condition associated with the following supporting information:  In responding to this query please exercise your independent judgment.  The fact that a query is asked, does not imply that any particular answer is desired or expected.   Possible Clinical Conditions?   " Acute Blood Loss Anemia  " Acute on chronic blood loss anemia  " Chronic blood loss anemia  " Other Condition  " Cannot Clinically Determine    Supporting Information:  Signs and Symptoms: Recurrent symptomatic anemia, presumably from GI bleed, with history of colonic AVMs noted per 8/9 progress notes.  Diagnostics: H&H on 8/08:    6.2/21.9 H&H on 8/09:    6.7/23.2  Transfusion: Has received a total of 4units prbc's per 8/11 progress notes.  Medications: Iron dextran complex 25mg  in NaCl 0.9%, 50ml IVPB;  Iron dextran complex in NaCl 0.9%  IVPB.  Reviewed: additional documentation in the medical record  Thank You,  Marciano Sequin,  Clinical Documentation Specialist:  Pager: [phone]  Health Information Management [place]

## 2012-04-27 NOTE — Progress Notes (Signed)
   Patient Name: [name] Date of Encounter: 04/27/2012   Principal Problem:  *Anemia Active Problems:  Exertional angina  HYPOTHYROIDISM  HYPERTENSION  CAD, NATIVE VESSEL  COPD  DYSPNEA  HYPOTHYROIDISM  GERD (gastroesophageal reflux disease)  Chest pain on exertion  CAD (coronary artery disease)  Microcytic anemia  Angiodysplasia of intestine with hemorrhage  Nonspecific abnormal finding in stool contents   SUBJECTIVE  No chest pain/sob.  Feeling better overall.  For Dobut. MV today.  CURRENT MEDS    . antiseptic oral rinse  15 mL Mouth Rinse BID  . aspirin EC  81 mg Oral Daily  . atorvastatin  20 mg Oral Daily  . atropine      . budesonide-formoterol  2 puff Inhalation BID  . diltiazem  120 mg Oral Daily  . DOBUTamine      . fesoterodine  4 mg Oral Daily  . furosemide  20 mg Oral Daily  . levothyroxine  112 mcg Oral Daily  . levothyroxine  50 mcg Oral Daily  . loratadine  10 mg Oral Daily  . multivitamin with minerals  1 tablet Oral Daily  . pantoprazole  40 mg Oral BID  . predniSONE  5 mg Oral Daily  . sodium chloride  3 mL Intravenous Q12H  . sodium chloride  3 mL Intravenous Q12H  . tiotropium  18 mcg Inhalation Daily  . DISCONTD: aspirin EC  325 mg Oral Daily   OBJECTIVE  Filed Vitals:   04/26/12 2101 04/27/12 0539 04/27/12 0912 04/27/12 0925  BP: 115/63 121/68 153/68   Pulse: 84 77 77 76  Temp: 97.9 F (36.6 C) 98.3 F (36.8 C)    TempSrc: Oral Oral    Resp: 18 18 16    Height:      Weight:  167 lb 1.6 oz (75.796 kg)    SpO2: 96% 96% 100%    No intake or output data in the 24 hours ending 04/27/12 0926 Filed Weights   04/25/12 0602 04/26/12 0449 04/27/12 0539  Weight: 164 lb 7.4 oz (74.6 kg) 167 lb (75.751 kg) 167 lb 1.6 oz (75.796 kg)   PHYSICAL EXAM  General: Pleasant, NAD. Neuro: Alert and oriented X 3. Moves all extremities spontaneously. Psych: Normal affect. HEENT:  Normal  Neck: Supple without JVD. Lungs:  Resp regular and  unlabored, CTA. Heart: RRR no s3, s4.  2/6 SEM USB. Abdomen: Soft, non-tender, non-distended, BS + x 4.  Extremities: No clubbing, cyanosis or edema. DP/PT/Radials 2+ and equal bilaterally.  Accessory Clinical Findings  CBC  Basename 04/27/12 0705 04/26/12 0611  WBC 13.2* 11.9*  NEUTROABS -- --  HGB 10.3* 9.9*  HCT 34.6* 32.9*  MCV 80.3 79.7  PLT 276 262   Basic Metabolic Panel  Basename 04/25/12 0520  NA 145  K 4.1  CL 107  CO2 32  GLUCOSE 119*  BUN 13  CREATININE 0.48*  CALCIUM 8.8  MG --  PHOS --   Cardiac Enzymes  Basename 04/24/12 1036  CKTOTAL 41  CKMB 2.1  CKMBINDEX --  TROPONINI <0.30   TELE  Seen in Nuc Med  ASSESSMENT AND PLAN  1.   Fe Def. Anemia/AVM's:  Stable s/p transfusion.  Iron therapy per GI.  2.  Exertional Angina:  In setting of anemia.  No recurrence.  For myoview today.  3.  Ao Stenosis:  Mild to mod by echo 10/2011.  Signed, Nicolasa Ducking NP   Attending: Agree. See my note.

## 2012-04-27 NOTE — Telephone Encounter (Signed)
It is hard to tell how often but, to be on the safe side, every 2 weeks x3, then q 4 weeks. She does not drive, lives by herself.Her niece will have to take her every time.

## 2012-04-27 NOTE — Progress Notes (Signed)
PCCM Progress Note  CC: Dyspnea and Chest Pain  HPI 79 former smoker, dx with COPD about 1999, also hx breast CA, CVA with R frontal AVM, colonic AVM's that have ben associated in the past with occult anemia and required cautery. Also diastolic dysfxn and single vessel CAD followed by Dr Gala Romney. Has been treated with Spiriva + Symbicort for her COPD. She has been on chronic prednisone tapering x  several months, currently on 5mg  qd after recent acute exacerbation in June 2013.   Events since Admit 8/8 - admitted with exertional CP and dyspnea.  8/11 - 4 units PRBC's 8/12 - NM Myoview>>>A small area of reversibility involving the anterior apical wall. Findings raise concern for a small area of pharmacologically induced ischemia. Calculated ejection fraction is 40% with mild hypokinesia.   Subjective: Pt denies complaints.  Concerned about O2 tank for discharge.    Objective: Temp:  [97.9 F (36.6 C)-98.5 F (36.9 C)] 98.3 F (36.8 C) (08/12 0539) Pulse Rate:  [76-131] 86  (08/12 0941) Resp:  [16-18] 16  (08/12 0912) BP: (115-171)/(34-78) 155/37 mmHg (08/12 0939) SpO2:  [95 %-100 %] 100 % (08/12 0912) Weight:  [167 lb 1.6 oz (75.796 kg)] 167 lb 1.6 oz (75.796 kg) (08/12 0539)   FI02  3lpm  GEN: A/Ox3; pleasant , NAD, elderly female  HEENT:  [place]/AT,  EACs-clear, TMs-wnl, NOSE-clear, THROAT-clear, no lesions, no postnasal drip or exudate noted.  NECK:  Supple w/ fair ROM; no JVD; normal carotid impulses w/o bruits; no thyromegaly or nodules palpated; no lymphadenopathy. RESP  Coarse BS at bases, no wheeze, good air movement CARD:  RRR, Gr 2/6 SM, 1+ ankle edema, pulses intact, no cyanosis or clubbing. Musco: Warm bil, no deformities or joint swelling noted.  Neuro: alert, no focal deficits noted.   Skin: Warm, no lesions or rashes   Lab 04/27/12 0705 04/26/12 0611 04/25/12 0520  HGB 10.3* 9.9* 9.2*  HCT 34.6* 32.9* 30.9*  WBC  13.2* 11.9* 12.3*  PLT 276 262 243    Lab 04/25/12 0520 04/24/12 0300 04/23/12 1955  NA 145 146* 140  K 4.1 3.5 --  CL 107 105 99  CO2 32 34* 34*  GLUCOSE 119* 129* 80  BUN 13 12 14   CREATININE 0.48* 0.54 0.52  CALCIUM 8.8 8.5 8.8  MG -- -- 2.4  PHOS -- -- 3.8      Lab 04/24/12 1036 04/24/12 0300 04/23/12 1955  TROPONINI <0.30 <0.30 <0.30   Nm Myocar Multi W/spect W/wall Motion / Ef  04/27/2012  *RADIOLOGY REPORT*  Clinical Data:  78 year old with chest pain.  MYOCARDIAL IMAGING WITH SPECT (REST AND PHARMACOLOGIC-STRESS) GATED LEFT VENTRICULAR WALL MOTION STUDY LEFT VENTRICULAR EJECTION FRACTION  Technique:  Standard myocardial SPECT imaging was performed after resting intravenous injection of 10 mCi Tc-48m tetrofosmin. Subsequently, intravenous infusion of dobutamine was performed under the supervision of the Cardiology staff.  At peak effect of the drug, 30 mCi Tc-68m tetrofosmin was injected intravenously and standard myocardial SPECT  imaging was performed.  Quantitative gated imaging was also performed to evaluate left ventricular wall motion, and estimate left ventricular ejection fraction.  Comparison:  MR cardiac 05/25/2009  Findings: There is slightly decreased uptake along the anterior apical wall on the stress images compared to the rest images. Findings concerning for a small area of reversibility.  There is a large amount of gut uptake on the rest images.  There are no other suspicious areas for a reversible or fixed defect.  There  is diffuse mild hypokinesia, particularly along the inferior wall. The end-diastolic volume is 79 ml and end-systolic volume is 47 ml. Calculated ejection fraction is 40%.  IMPRESSION: A small area of reversibility involving the anterior apical wall. Findings raise concern for a small area of pharmacologically induced ischemia.  Calculated ejection fraction is 40% with mild hypokinesia.  These results will be called to the ordering clinician or  representative by the Radiologist Assistant, and communication documented in the PACS Dashboard.  Original Report Authenticated By: Richarda Overlie, M.D.    Assessment & Plan: COPD Appears to be stable at this time, without evidence acute exacerbation.  -continue pred 5mg  qd -continue O2 + Spiriva + Symbicort -case mgmt to assist in tank for d/c planning  HYPOTHYROIDISM, TSH 0.39 -continue home synthroid  HYPERTENSION -continue home dilt -increased lasix on admission to 40mg  bid x 3 doses     CAD, NATIVE VESSEL Presenting complaints very concerning for unstable angina, now with evidence that this is demand ischemia pattern due to her anemia.  - ASA increased from 81 to 325mg  but watch for gib - Appreciate Dr Bensimhon's evaluation. > for stress test am 8/12 then ? Home?  GERD (gastroesophageal reflux disease) -Continue PPI  HX OF AVM'S AND MICROCYTIC ANEMIA, INR 0.92 Recurrent anemia confirmed, Hgb 6.2 on arrival 8/8 >> 6.7 after 1u PRBC. She has had multiple CSY in past, followed by Dr Mechele Collin in [place], has been cauterized before.  Plan: -goal Hgb > 8.0 in presence possible ischemia -consult GI for ? CSY vs bleeding scan, etc.  -Ferritin 11 8/10    Await cardiology input on myoview findings.   Canary Brim, NP-C [facility] Pgr: [phone] or [phone]   Attending Addendum:  I have seen the patient, discussed the issues, test results and plans with B. Veleta Miners, NP. I agree with the Assessment and Plans as ammended above.   Levy Pupa, MD, PhD 04/27/2012, 4:12 PM [facility] [phone] or if no answer [phone]

## 2012-04-27 NOTE — Telephone Encounter (Signed)
I have spoken to Gavin Pound @ Dr Su Hilt office to advise that patient will need a CBC on 05/01/12 with repeat CBC's thereafter due to chronic gi bleeding from AVM's. Gavin Pound requests that I send an order to their office at fax [phone]. Dr Juanda Chance, how often would you like patient's CBC's to be drawn?

## 2012-04-28 LAB — CBC
MCH: 24.3 pg — ABNORMAL LOW (ref 26.0–34.0)
MCV: 82 fL (ref 78.0–100.0)
Platelets: 274 10*3/uL (ref 150–400)
RDW: 19.6 % — ABNORMAL HIGH (ref 11.5–15.5)

## 2012-04-28 LAB — RETICULIN ANTIBODIES, IGA W TITER

## 2012-04-28 MED ORDER — PANTOPRAZOLE SODIUM 40 MG PO TBEC: 40.0000 mg | DELAYED_RELEASE_TABLET | Freq: Two times a day (BID) | ORAL | Status: DC

## 2012-04-28 NOTE — Discharge Summary (Addendum)
Physician Discharge Summary  Patient ID: [name] MRN: [id number] DOB/AGE: 1932-12-09 78 y.o.  Admit date: 04/23/2012 Discharge date: 04/28/2012    Discharge Diagnoses:  Acute on Chronic Blood loss anemia Microcytic Anemia  Angiodysplasia of intestine with hemorrhage HTN  CAD  Chest Pain  Exertional Angina COPD  Dyspnea HYPOTHYROIDISM GERD (gastroesophageal reflux disease)     Brief Summary: [name] is a 78 y.o. y/o female, former smoker, with a PMH of COPD (dx in 1999), breast CA, muliple CVA's with R frontal AVM, colonic AVM's that have ben associated in the past with occult anemia and required cautery, diastolic dysfunction and single vessel CAD followed by Dr Gala Romney. Has been treated with Spiriva + Symbicort for her COPD. She has been on chronic prednisone tapering x several months, currently on 5mg  qd after recent acute exacerbation in June 2013.  Seen in Pulmonary office with complaints of 1.5 week of worsening SOB, chest pain especially with exertion or talking, bilateral lower extremity edema, increased wheezing that did not respond to increased ProAir usage and was sent for direct admit to [facility] for further work up.  After evaluation she was found to a Hgb of 6.2 (last known hgb 8 weeks prior to admit 10.4) and was transfused during admit with a total of 5 units PRBC's.   She was also noted to have abnormal EKG with chronic inferior / lateral Q-waves.  Underwent Myoview on 8/12 which was a poor quality study due to extensive gut artifact but demonstrated good perfusion, Distal anterior wall defect not convincing for ischemia and LAD was normal on cath just a year and a a half ago.No significant RCA ischemia. Visually EF looks much better than 40%.       Consults: Cardiology - Dr. Gala Romney GI - Dr. Lina Sar  Microbiology/Sepsis markers:   Significant Diagnostic Studies:  8/12 - NM Myoview>>>A small area of reversibility involving the anterior apical  wall. Findings raise concern for a small area of pharmacologically induced ischemia. Calculated ejection fraction is 40% with mild hypokinesia.                                                                      Hospital Summary by Discharge Diagnosis   HTN / CAD / Chest Pain / Exertional Angina  She does have an abnormal ECG with inferior and lateral Q waves but Myoview and echo have been normal. Cath January 2012 showed Mild non-obs CAD with ~50% ostial RCA lesion. Left system normal. EF 70%. Mild PAH. Right atrial pressure mean of 8, RV pressure 43/6 with an EDP of 14, PA pressure 42/17 with a mean of 29, pulmonary capillary wedge pressure was mean of 15. Underwent Myoview on 8/12 which was a poor quality study due to extensive gut artifact but demonstrated good perfusion, Distal anterior wall defect not convincing for ischemia and LAD was normal on cath just a year and a a half ago.  No significant RCA ischemia. Visually EF looks much better than 40%.    Discharge Plan: -continue home dilt  - ASA 81mg  but watch for GIB -Appt in [facility] 9/5 11am with ECHO to reassess EF    Microcytic Anemia / Angiodysplasia of intestine with hemorrhage Known hx of AVM's  followed by Dr. Markham Jordan in [place] s/p cautery.  On admit, patient noted to have hgb 6.2 and required transfusion.  The patient states that she typically does not present with frank blood or even black stools but on occasion she has noted some dark stools. She has had multiple transfusions most recently in June for hemoglobin 6 and subsequent rise in hemoglobin to 10. She has never had a hematology workup. She has had an EGD and at least 1 capsule endoscopy which failed to show any source of bleeding. She has had numerous colonoscopies primarily in [place]. Ferritin 11 8/10    Discharge Plan: -serial CBC's to be done at Dr. Su Hilt office (she is transitioning to new PCP) with results faxed to Dr. Juanda Chance -follow up with GI     COPD / Dyspnea Appears to be stable at this time, no evidence acute exacerbation on admit.  Discharge Plan:  -continue pred 5mg  qd  -continue O2 + Spiriva + Symbicort  -continue home O2 as previously ordered (on 3L at baseline)   Hypothyroidism TSH 0.39   Discharge Plan: -continue home synthroid    GERD (gastroesophageal reflux disease)  Discharge Plan: -Continue PPI BID -follow up with GI with CBC's as above   Discharge Exam: GEN: A/Ox3; pleasant , NAD, elderly female  HEENT: [place]/AT, EACs-clear, TMs-wnl, NOSE-clear, THROAT-clear, no lesions, no postnasal drip or exudate noted.  NECK: Supple w/ fair ROM; no JVD; normal carotid impulses w/o bruits; no thyromegaly or nodules palpated; no lymphadenopathy.  RESP Coarse BS at bases, no wheeze, good air movement  CARD: RRR, Gr 2/6 SM, 1+ ankle edema, pulses intact, no cyanosis or clubbing.  Musco: Warm bil, no deformities or joint swelling noted.  Neuro: alert, no focal deficits noted.  Skin: Warm, no lesions or rashes   Filed Vitals:   04/27/12 0941 04/27/12 1433 04/27/12 2103 04/28/12 0600  BP:  123/97 114/64 114/72  Pulse: 86 86 77 75  Temp:  98.7 F (37.1 C) 98.1 F (36.7 C) 97.8 F (36.6 C)  TempSrc:  Oral Oral Oral  Resp:  18 20 18   Height:      Weight:      SpO2:  97% 93% 94%     Discharge Labs  BMET  Lab 04/25/12 0520 04/24/12 0300 04/23/12 1955  NA 145 146* 140  K 4.1 3.5 --  CL 107 105 99  CO2 32 34* 34*  GLUCOSE 119* 129* 80  BUN 13 12 14   CREATININE 0.48* 0.54 0.52  CALCIUM 8.8 8.5 8.8  MG -- -- 2.4  PHOS -- -- 3.8    CBC  Lab 04/28/12 0525 04/27/12 0705 04/26/12 0611  HGB 11.2* 10.3* 9.9*  HCT 37.8 34.6* 32.9*  WBC 12.5* 13.2* 11.9*  PLT 274 276 262   Anti-Coagulation  Lab 04/23/12 1955  INR 0.92     Discharge Orders    Future Appointments: Provider: Department: Dept Phone: Center:   05/22/2012 8:00 AM [facility] [phone] None   05/22/2012 9:15 AM  [facility] [phone] None     Future Orders Please Complete By Expires   Diet - low sodium heart healthy      Increase activity slowly      Discharge instructions      Comments:   -Follow up with Dr. Gala Romney as scheduled -Send your hemoglobin (blood counts) to Dr. Lina Sar.   -Review your medication list carefully   Call MD for:  persistant nausea and  vomiting      Call MD for:  severe uncontrolled pain      Call MD for:  difficulty breathing, headache or visual disturbances      Call MD for:  persistant dizziness or light-headedness      Call MD for:  extreme fatigue          Follow-up Information    Follow up with Arvilla Meres, MD on 05/22/2012. (at 9:15 garage code [id number])    Contact information:   [address] [place] [address] [phone]       Follow up with Lamona Curl, MD on 05/01/2012. (Keep scheduled appt for Friday (with Dr. Su Hilt))    Contact information:   [address] [place] [address] [place] [address] [phone]       Follow up with Lina Sar, MD. (As needed.  Please have Hemoglobin checks sent to her office.  )    Contact information:   [address] [facility] [address] [place] [address] [place] [address] [phone]         Discussed pulmonary follow up with Mrs. Ivie - she wants to call for an appointment since she is ride dependent.   Instructed her to call to be seen in approximately one month.     Medication List  As of 04/28/2012 11:13 AM   CONTINUE taking these medications         * albuterol (2.5 MG/3ML) 0.083% nebulizer solution   Commonly known as: PROVENTIL   Take 3 mLs (2.5 mg total) by nebulization every 6 (six) hours as needed for wheezing.      * albuterol 108 (90 BASE) MCG/ACT inhaler   Commonly known as: PROVENTIL HFA;VENTOLIN HFA   Inhale 2 puffs into the lungs every 6 (six) hours as needed.      aspirin 81 MG EC tablet      atorvastatin 20  MG tablet   Commonly known as: LIPITOR      budesonide-formoterol 160-4.5 MCG/ACT inhaler   Commonly known as: SYMBICORT   Inhale 2 puffs into the lungs 2 (two) times daily.      CALCIUM 600 1500 MG Tabs   Generic drug: Calcium Carbonate      cetirizine 10 MG tablet   Commonly known as: ZYRTEC      diltiazem 120 MG 24 hr capsule   Commonly known as: CARDIZEM CD   Take 1 capsule (120 mg total) by mouth daily.      furosemide 20 MG tablet   Commonly known as: LASIX      HYDROcodone-acetaminophen 5-500 MG per tablet   Commonly known as: VICODIN      * levothyroxine 112 MCG tablet   Commonly known as: SYNTHROID, LEVOTHROID      * levothyroxine 50 MCG tablet   Commonly known as: SYNTHROID, LEVOTHROID      One-A-Day Extras Antioxidant Caps      pantoprazole 40 MG tablet   Commonly known as: PROTONIX   Take 1 tablet (40 mg total) by mouth 2 (two) times daily.      predniSONE 5 MG tablet   Commonly known as: DELTASONE      tiotropium 18 MCG inhalation capsule   Commonly known as: SPIRIVA   Place 1 capsule (18 mcg total) into inhaler and inhale daily.      tolterodine 2 MG 24 hr capsule   Commonly known as: DETROL LA      VITAMIN D-3 PO     *  Notice: This list has 4 medication(s) that are the same as other medications prescribed for you. Read the directions carefully, and ask your doctor or other care provider to review them with you.        Where to get your medications       Information on where to get these meds is not yet available. Ask your nurse or doctor.         pantoprazole 40 MG tablet              Disposition: 01-Home or Self Care  Discharged Condition: Ivet Guerrieri has met maximum benefit of inpatient care and is medically stable and cleared for discharge.  Patient is pending follow up as above.      Time spent on disposition:  Greater than 35 minutes.   Signed: Canary Brim, NP-C [facility] Pgr:  [phone]   Attending Addendum:  I have seen the patient, discussed the issues, test results and plans with B. Veleta Miners, NP. I agree with the Assessment and Plans as ammended above.   Levy Pupa, MD, PhD 04/28/2012, 11:35 AM [facility] [phone] or if no answer [phone]

## 2012-04-28 NOTE — Telephone Encounter (Signed)
I have faxed orders for cbc q2weeks x 3 and q4weeks thereafter to Dr Su Hilt office.

## 2012-05-07 ENCOUNTER — Telehealth: Payer: Self-pay | Admitting: Internal Medicine

## 2012-05-07 ENCOUNTER — Encounter: Payer: Self-pay | Admitting: Internal Medicine

## 2012-05-07 NOTE — Telephone Encounter (Signed)
Spoke with pt today, feeling better since discharge on 04/28/2012, when her Hgb was 11.2, today , it is 11.9. Blood work done at [facility]. Next labs in 2 weeks  Will continue to monitor , hx of avm's, chronic GI blood loss.Marland KitchenDB

## 2012-05-22 ENCOUNTER — Other Ambulatory Visit (HOSPITAL_COMMUNITY): Payer: Medicare Other

## 2012-05-22 ENCOUNTER — Ambulatory Visit (HOSPITAL_COMMUNITY): Payer: Medicare Other

## 2012-05-22 ENCOUNTER — Encounter (HOSPITAL_COMMUNITY): Payer: Medicare Other

## 2012-06-01 ENCOUNTER — Encounter: Payer: Self-pay | Admitting: Emergency Medicine

## 2012-06-01 ENCOUNTER — Ambulatory Visit (INDEPENDENT_AMBULATORY_CARE_PROVIDER_SITE_OTHER): Payer: Medicare Other | Admitting: Emergency Medicine

## 2012-06-01 VITALS — BP 110/52 | HR 96 | Ht 66.5 in | Wt 164.4 lb

## 2012-06-01 DIAGNOSIS — J449 Chronic obstructive pulmonary disease, unspecified: Secondary | ICD-10-CM

## 2012-06-01 DIAGNOSIS — Z23 Encounter for immunization: Secondary | ICD-10-CM

## 2012-06-01 NOTE — Assessment & Plan Note (Signed)
Appears to be stable at this time. Her dyspnea appears to be exertional. She did have a small reversible defect on her myoview from 8/13.  - following CBC to eval for anemia.  - f/u with Dr Gala Romney as planned - continue same inhaled regimen.  - follow in 2 mon

## 2012-06-01 NOTE — Progress Notes (Signed)
Subjective:    Patient ID: [name], female    DOB: 1933-06-29, 78 y.o.   MRN: [id number] HPI 18 former smoker, dx with COPD about 1999, also hx breast CA, CVA with R frontal AVM, colonic AVM's. Also diastolic dysfxn followed Dr Gala Romney. Has been treated with Spiriva + Advair, since 2003. Now on Symbicort  September 21, 2010--Presents for an acute office visit. Complains of wheezing, increased SOB, dry cough x2-3days. Complains of last 4-5 months has been on abx and steroids each month. As soon as she finishes her course symptoms begin to come back with cough, wheezing and dyspnea. Last 2 days, dry cough w/ wheezing. No discolored mucus. Denies chest pain, orthopnea, hemoptysis, fever, n/v/d, edema, headache.   10/30/10 -- follow up severe COPD. Flare last visit, treated with pred and abx, changed Advair to Symbicort to see if this helped with UA irritation and cough. We left her on Pred 10mg  by mouth once daily with plans to f/u and decide whether to stay on chronically.   02/14/11 Acute OV  Pt presents for a work in visit. Complains of increased SOB w/ exertion and sats into 70s w/ exertion, HR varying from the upper 40s to the 120s. Pt complains that over last couple of weeks she feels tired , low energy and more short of breath with walking. She has marathon helios that she has noticed she desats with walking into the 70s. She uses the demand setting. On the continuous flow she does not desat. Today in the office no desaturation with walking on 3 l/m continuous flow. HR ranges 100-120 with walking and at rest. On her portable oximeter her HR avg 40-120 , in the office HR maintained ~106-120 with walking . EKG w/ no acute changes w/ HR ~90 at rest. Does feel tight in chest with walking. Has some occasional wheezing. No discolored mucus or fever. Ankles have been puffy for last several days.  Recently changed from her bisoprolol to cardizem by cardiology.  ROV 03/05/11 -- severe COPD w hypoxemia. As  above, recently seen for DOE and labile HR. Since last time was admitted for anemia, Hgb 6.0. In after math feels her breathing and fatigue are improved. Returns today for f/u. She had to have AVM's cauterized. Has been on chronic pred for last 6 months, currently on 5mg  daily. Her goal is to taper to zero. Currently on Symbicort, Spiriva, uses SABA prn not every day.   ROV 04/03/11 -- severe COPD. Last time we decreased Pred to 3mg  qd. Since our visit she was readmitted for anemia, she is being followed by GI (Dr Mechele Collin). She has had endoscopy, CSY, capsule endoscopy. She feels washed out from the anemia. No real breathing change, wheezing, coughing.   ROV 06/04/11 - written note. Decreased Pred to 1mg  qd.  ROV 07/29/11 -- severe COPD with hypoxemia, tapering steroids. Last time we decreased to 1mg  daily.  She presents with worsening dyspnea, wheeze. No real cough. Her SABA use has increased over the last week. She is less mobile in the home, needed wheelchair.  >>admitted   08/12/2011 [facility]  Admitted 11/12-11/15 for AECOPD , tx w/ IV abx , steroids and nebs. CXR without acute process.  Discharged on zpack and steroid taper. She is feeling better with decreased cough and dyspnea  Continued on Spiriva and Symbicort .  Currently on prednisone 10mg  daily  No chest pain , discolored mucus or fever   ROV 09/02/11 -- Severe COPD, has been tapering chronic  pred. Hosp for AE in early Nov '12. Prednisone is down to 5mg  now. She has experienced B LE edema over the last month (? Can due to pred).   ROV 10/03/11 -- Severe COPD, has been tapering chronic pred. Last time we decreased pred from 5mg  to 3mg  daily. She tells me that she is still having problems w anemia (has had colonic AVM cauterized before). Hx of diastolic dysfxn, ? R heart failure. She is planning to see cards today, may need diuretics adjusted.   ROV 12/03/11 -- Severe COPD, diastolic dysfxn, tapering chronic pred currently on 3mg . She  is on Zyrtec, has been having some red eyes. Wearing O2 at night and prn. On spiriva + symbicort + SABA, averages once every few weeks.   ROV 01/28/12 -- Severe COPD, diastolic dysfxn, tapering chronic pred, last time decreased to 2mg  qd. She is a bit less active over the last year. Having good days and bad. She uses her O2 prn and at night. She has not flared.   02/24/2012 Acute OV  Complains of productive cough,sob ,wheezing for 4 days.  Barky cough , worse for last few days Currently on prednisone 2mg   No hemotpysis or increased edema. No fever.   ROV 03/06/12 -- Severe COPD, diastolic dysfxn, tag pering chronic pred, last time decreased to 2mg  qd.  She was recently  Caught URI, was seen and treated as above. Now tapered back down to 2mg  Pred qd.  She continues to have cough and fatigue. The cough is non-prod. She is still having nasal gtt and throat congestion.   Follow up 03/13/12 -  Last visit with COPD flare , tx Levaquin and Prednisone taper  Returns today feeling better. Has few days left of prednisone. Last day of Levaquin.  CXR last ov w/ no acute finding.  Cough and wheezing are decreased but not gone.  Mucus is clear only. No fever.  Currently on prednisone 20mg  daily  No hemoptysis or edema.   ROV 04/23/12 -- Severe COPD, HTN + diastolic dysfxn, hypoxemia. Has been on chronic pred, recent AE as above. Tapered down to Pred 5mg  since another burst 6/28. She presents today with worsening SOB, chest pain especially with exertion or talking. This has been happening for about 1.5 week. She also has more LE edema, more wheezing. She has increased her ProAir use but it hasn't helped very much. Taking protonix qd.    ROV 06/01/12 -- Severe COPD, HTN + diastolic dysfxn, hypoxemia. Has been on chronic pred. Last time I saw her [facility] related to anemia. She is having exertional CP and dyspnea. Her last Hgb (her report) was 12.2. Followed by Dr Juanda Chance.    Objective:  Physical Exam Filed Vitals:   06/01/12 1540  BP: 110/52  Pulse: 96   GEN: A/Ox3; pleasant , NAD, elderly female   HEENT:  [place]/AT,  EACs-clear, TMs-wnl, NOSE-clear, THROAT-clear, no lesions, no postnasal drip or exudate noted.   NECK:  Supple w/ fair ROM; no JVD; normal carotid impulses w/o bruits; no thyromegaly or nodules palpated; no lymphadenopathy.  RESP  Coarse crackles at bases, no wheeze, good air movement  CARD:  RRR, Gr 1/6 SM, trace ankle edema, pulses intact, no cyanosis or clubbing.  Musco: Warm bil, no deformities or joint swelling noted.   Neuro: alert, no focal deficits noted.    Skin: Warm, no lesions or rashes   Assessment & Plan:   COPD Appears to be stable  at this time. Her dyspnea appears to be exertional. She did have a small reversible defect on her myoview from 8/13.  - following CBC to eval for anemia.  - f/u with Dr Gala Romney as planned - continue same inhaled regimen.  - follow in 2 mon

## 2012-06-01 NOTE — Patient Instructions (Addendum)
Please continue your inhaled medications. We will substitute ventolin for ProAir Walking oximetry today Follow with Drs Gala Romney and Juanda Chance as planned.  Increase oxygen to 4L with any exertion Follow with Dr Delton Coombes in 2 month

## 2012-06-09 ENCOUNTER — Other Ambulatory Visit (INDEPENDENT_AMBULATORY_CARE_PROVIDER_SITE_OTHER): Payer: Medicare Other

## 2012-06-09 ENCOUNTER — Other Ambulatory Visit: Payer: Self-pay | Admitting: Cardiology

## 2012-06-09 ENCOUNTER — Other Ambulatory Visit: Payer: Self-pay

## 2012-06-09 DIAGNOSIS — R0602 Shortness of breath: Secondary | ICD-10-CM

## 2012-06-09 DIAGNOSIS — I2510 Atherosclerotic heart disease of native coronary artery without angina pectoris: Secondary | ICD-10-CM

## 2012-06-16 ENCOUNTER — Encounter (HOSPITAL_COMMUNITY): Payer: Medicare Other

## 2012-06-16 ENCOUNTER — Other Ambulatory Visit (HOSPITAL_COMMUNITY): Payer: Medicare Other

## 2012-06-18 ENCOUNTER — Encounter (HOSPITAL_COMMUNITY): Payer: Self-pay

## 2012-06-18 ENCOUNTER — Ambulatory Visit (HOSPITAL_COMMUNITY)
Admission: RE | Admit: 2012-06-18 | Discharge: 2012-06-18 | Disposition: A | Discharge status: Home / Self Care | Payer: Medicare Other | Source: Ambulatory Visit | Attending: Internal Medicine | Admitting: Internal Medicine

## 2012-06-18 VITALS — BP 114/62 | HR 97 | Ht 66.5 in | Wt 164.8 lb

## 2012-06-18 DIAGNOSIS — I779 Disorder of arteries and arterioles, unspecified: Secondary | ICD-10-CM | POA: Insufficient documentation

## 2012-06-18 DIAGNOSIS — I509 Heart failure, unspecified: Secondary | ICD-10-CM | POA: Insufficient documentation

## 2012-06-18 DIAGNOSIS — R0602 Shortness of breath: Secondary | ICD-10-CM

## 2012-06-18 DIAGNOSIS — R0989 Other specified symptoms and signs involving the circulatory and respiratory systems: Secondary | ICD-10-CM | POA: Insufficient documentation

## 2012-06-18 DIAGNOSIS — R0609 Other forms of dyspnea: Secondary | ICD-10-CM | POA: Insufficient documentation

## 2012-06-18 DIAGNOSIS — I10 Essential (primary) hypertension: Secondary | ICD-10-CM | POA: Insufficient documentation

## 2012-06-18 DIAGNOSIS — I6529 Occlusion and stenosis of unspecified carotid artery: Secondary | ICD-10-CM

## 2012-06-18 DIAGNOSIS — D649 Anemia, unspecified: Secondary | ICD-10-CM | POA: Insufficient documentation

## 2012-06-18 NOTE — Assessment & Plan Note (Signed)
I think this is multifactorial due to her COPD, anemia and diastolic dysfunction/HOCM. I have reviewed her stress test and heart cath and do not think she is ischemic as possible defect on Myoview does not match up with her CAD pattern. At this point will increase lasix to 20 daily alternating with 40 daily and see if this helps. Will also refer her to hematology to further evaluate her anemia and help keep her HgB up. (She will call Dr. Regino Schultze office to get GI f/u as well). Check labs in 1-2 weeks. See back in 2-3 months.

## 2012-06-18 NOTE — Assessment & Plan Note (Signed)
Suspect this is due to chronic GIB and resultant iron-deficiency. She received IV iron in hospital but has not seen Dr. Juanda Chance in f/u yet. I have asked her to make a f/u appt. Will also refer her to hematology.

## 2012-06-18 NOTE — Progress Notes (Signed)
Weight Range   Baseline proBNP     HPI:  [name] is a delightful 78 year old woman with a history of COPD on home O2, hypertension, hyperlipidemia, previous breast CA,  h/o GIB due to AVMs and peripheral arterial disease. She is status post multiple CVAs in the setting of a previous left carotid stenosis, which she has underwent endarterectomy before at [facility] in 2005.   She denies any history of known MI. She does have an abnormal ECG with inferior and lateral Q waves but Myoview and echo have been normal. Cath January 2012 showed Mild non-obs CAD with ~50% ostial RCA lesion. Left system normal. EF 70%. Mild PAH. Right atrial pressure mean of 8, RV pressure 43/6 with an EDP of 14, PA pressure 42/17 with a mean of 29, pulmonary capillary wedge pressure was mean of 15.   Admitted from Dr. Kavin Leech office in 04/23/12 for CP in setting Hgb 6.2. Got transfused. Seen by Dr. Juanda Chance who felt anemia due to chronic GIB. Received IV iron. Dobutamine Myoview with EF 40% (visually better) with small area of anterior apical reversible defect - ? Ischemia versus shifting breast artifact.  Here for f/u. Hgb 12.3 on d/c and now drifting down to 10.9 over 2 weeks. No obvious bleeding. Has not gotten any more iron. No CP. Having increased DOE and mild edema.   Echo 06/09/12:  - Left ventricle: The cavity size was normal. There was severe concentric hypertrophy. Systolic function was vigorous. The estimated ejection fraction was in the range of 65% to 70%. Wall motion was normal; there were no regional wall motion abnormalities. Doppler parameters are consistent with abnormal left ventricular relaxation (grade 1 diastolic dysfunction). LVOT peak gradient: 39mm Hg (S). This increased to 55 mm Hg with Valsalva. - Mitral valve: Calcified annulus. Mildly thickened leaflets . There was systolic anterior motion of the chordal structures. Transvalvular velocity was increased. The findings are consistent with mild  stenosis. Moderate regurgitation. - Left atrium: The atrium was mildly dilated. - Tricuspid valve: Moderate regurgitation. - Pulmonary arteries: PA peak pressure: 53mm Hg (S).     ROS: All systems negative except as listed in HPI, PMH and Problem List.  Past Medical History  Diagnosis Date  . COPD (chronic obstructive pulmonary disease)   . Hypertrophic obstructive cardiomyopathy     Echo 10/10 EF 65-70% SW 1.6 PW 1.4 mild SAM no LVOT gradient at rest. Valsalva not performed Mild MR. No hyperenhance ment on MRI EF 69%.  . Coronary atherosclerosis of native coronary artery     Cath 1/11: RCA 50-60 ostial o/w normal. EF 70%.   . Pulmonary hypertension     Cath 1/11 RA 8, RV 43/6/14, PA 42/17 (29) PCWP 15 Fick  5.0/2.6. PVR 2.8    . Edema   . Occlusion and stenosis of carotid artery     s/p L CEA u/s 3/11. R 60-79%; L 40-59%. no change since 3/10  . Peripheral vascular disease, unspecified   . Hypertension   . Emphysema   . Asthma   . Hypothyroidism   . Allergic rhinitis   . Gastric AVM   . Heart murmur   . Anginal pain     "pressure"  . Emphysema   . Oxygen dependent 04/23/2012    "concentrated at night; liquid during day"  . Dyspnea 04/23/2012    "all the time"  . Anemia 04/23/2012    "severe @ times; think caused by colonic AVMs"  . History of blood transfusion     "  many times"  . H/O hiatal hernia   . Stroke ~ 2004    denies residual on 04/23/2012  . Arthritis     "probably"  . Breast cancer     Current Outpatient Prescriptions  Medication Sig Dispense Refill  . albuterol (PROVENTIL HFA;VENTOLIN HFA) 108 (90 BASE) MCG/ACT inhaler Inhale 2 puffs into the lungs every 6 (six) hours as needed.  1 Inhaler  11  . albuterol (PROVENTIL) (2.5 MG/3ML) 0.083% nebulizer solution Take 3 mLs (2.5 mg total) by nebulization every 6 (six) hours as needed for wheezing.  75 mL  12  . aspirin 81 MG EC tablet Take 81 mg by mouth daily.        Marland Kitchen atorvastatin (LIPITOR) 20 MG tablet Take 20  mg by mouth daily.        . budesonide-formoterol (SYMBICORT) 160-4.5 MCG/ACT inhaler Inhale 2 puffs into the lungs 2 (two) times daily.  1 Inhaler  11  . Calcium Carbonate (CALCIUM 600) 1500 MG TABS Take 1 tablet by mouth daily.        . cetirizine (ZYRTEC) 10 MG tablet Take 10 mg by mouth daily.      . Cholecalciferol (VITAMIN D-3 PO) Take 1 tablet by mouth daily.      Marland Kitchen diltiazem (CARDIZEM CD) 120 MG 24 hr capsule Take 1 capsule (120 mg total) by mouth daily.  30 capsule  6  . furosemide (LASIX) 20 MG tablet Take 20 mg by mouth daily.        Marland Kitchen HYDROcodone-acetaminophen (VICODIN) 5-500 MG per tablet Take 1 tablet by mouth every 4 (four) hours as needed. For pain      . levothyroxine (SYNTHROID, LEVOTHROID) 112 MCG tablet Take 112 mcg by mouth daily.       Marland Kitchen levothyroxine (SYNTHROID, LEVOTHROID) 50 MCG tablet Take 50 mcg by mouth daily.        . Multiple Vitamins-Minerals (ONE-A-DAY EXTRAS ANTIOXIDANT) CAPS Take 1 capsule by mouth daily.        . pantoprazole (PROTONIX) 40 MG tablet Take 1 tablet (40 mg total) by mouth 2 (two) times daily.  60 tablet  5  . predniSONE (DELTASONE) 5 MG tablet Take 5 mg by mouth daily.      Marland Kitchen tiotropium (SPIRIVA) 18 MCG inhalation capsule Place 1 capsule (18 mcg total) into inhaler and inhale daily.  30 capsule  5  . tolterodine (DETROL LA) 2 MG 24 hr capsule Take 2 mg by mouth daily.         PHYSICAL EXAM: Filed Vitals:   06/18/12 1539  BP: 114/62  Pulse: 97  Height: 5' 6.5" (1.689 m)  Weight: 164 lb 12.8 oz (74.753 kg)  SpO2: 92%    General: Elderly. No acute distress. On O2  HEENT: normal  Neck: supple. JVP 7. Carotids 2+ bilat; + bilateral bruits. No lymphadenopathy or thryomegaly appreciated.  Cor: PMI nondisplaced. Regular rate & rhythm. No rubs, gallops. 3/6 LV outflow murmur increases with valsalva. S2 preserved 2/6 TR/MR Lungs: mildly decreased air movement throughout no wheezes. Abdomen: soft, nontender, nondistended. No hepatosplenomegaly.  No bruits or masses. Good bowel sounds.  Extremities: no cyanosis, clubbing, rash, 1+ edema  Neuro: alert & oriented x 3, cranial nerves grossly intact. moves all 4 extremities w/o difficulty. Affect pleasant.   ASSESSMENT & PLAN:

## 2012-06-18 NOTE — Patient Instructions (Addendum)
Labs in 10 days (Oct 14th)  You have been referred to Hematology/Cancer Center they will contact you for the appointment  Your physician recommends that you schedule a follow-up appointment in: 3 months

## 2012-06-18 NOTE — Assessment & Plan Note (Signed)
Stable. Gradient can be exacerbated by anemia - keep HGb up. Can increase diltiazem as needed.

## 2012-06-24 ENCOUNTER — Other Ambulatory Visit: Payer: Self-pay | Admitting: Oncology

## 2012-06-24 ENCOUNTER — Telehealth: Payer: Self-pay | Admitting: Oncology

## 2012-06-24 NOTE — Telephone Encounter (Signed)
S/W pt in re NP appt 10/24 @ 2:30 w/ Dr. Arline Asp Referring Dr. Gala Romney Dx- Anemia NP packet mailed.

## 2012-06-25 ENCOUNTER — Other Ambulatory Visit: Payer: Self-pay | Admitting: Oncology

## 2012-06-25 DIAGNOSIS — D509 Iron deficiency anemia, unspecified: Secondary | ICD-10-CM

## 2012-06-25 DIAGNOSIS — D649 Anemia, unspecified: Secondary | ICD-10-CM

## 2012-06-29 ENCOUNTER — Other Ambulatory Visit (HOSPITAL_COMMUNITY): Payer: Medicare Other

## 2012-06-29 ENCOUNTER — Ambulatory Visit (INDEPENDENT_AMBULATORY_CARE_PROVIDER_SITE_OTHER): Payer: Medicare Other

## 2012-06-29 ENCOUNTER — Encounter (HOSPITAL_COMMUNITY): Payer: Medicare Other

## 2012-06-29 DIAGNOSIS — I10 Essential (primary) hypertension: Secondary | ICD-10-CM

## 2012-06-29 DIAGNOSIS — Z79899 Other long term (current) drug therapy: Secondary | ICD-10-CM

## 2012-06-30 LAB — BASIC METABOLIC PANEL
BUN/Creatinine Ratio: 29 — ABNORMAL HIGH (ref 11–26)
CO2: 27 mmol/L (ref 19–28)
Creatinine, Ser: 0.58 mg/dL (ref 0.57–1.00)
GFR calc Af Amer: 103 mL/min/{1.73_m2} (ref >59)
Sodium: 144 mmol/L (ref 134–144)

## 2012-07-09 ENCOUNTER — Ambulatory Visit: Payer: Medicare Other

## 2012-07-09 ENCOUNTER — Other Ambulatory Visit: Payer: Medicare Other | Admitting: Lab

## 2012-07-09 ENCOUNTER — Ambulatory Visit: Payer: Medicare Other | Admitting: Oncology

## 2012-07-13 ENCOUNTER — Other Ambulatory Visit: Payer: Self-pay | Admitting: Oncology

## 2012-07-13 DIAGNOSIS — D649 Anemia, unspecified: Secondary | ICD-10-CM

## 2012-07-14 ENCOUNTER — Encounter: Payer: Self-pay | Admitting: Oncology

## 2012-07-14 ENCOUNTER — Telehealth: Payer: Self-pay | Admitting: *Deleted

## 2012-07-14 ENCOUNTER — Ambulatory Visit (HOSPITAL_BASED_OUTPATIENT_CLINIC_OR_DEPARTMENT_OTHER): Payer: Medicare Other | Admitting: Oncology

## 2012-07-14 ENCOUNTER — Other Ambulatory Visit (HOSPITAL_BASED_OUTPATIENT_CLINIC_OR_DEPARTMENT_OTHER): Payer: Medicare Other | Admitting: Lab

## 2012-07-14 ENCOUNTER — Other Ambulatory Visit: Payer: Self-pay | Admitting: Internal Medicine

## 2012-07-14 ENCOUNTER — Ambulatory Visit: Payer: Medicare Other

## 2012-07-14 VITALS — BP 130/64 | HR 84 | Temp 98.0°F | Resp 24 | Ht 66.5 in | Wt 165.7 lb

## 2012-07-14 DIAGNOSIS — D649 Anemia, unspecified: Secondary | ICD-10-CM

## 2012-07-14 DIAGNOSIS — D500 Iron deficiency anemia secondary to blood loss (chronic): Secondary | ICD-10-CM

## 2012-07-14 DIAGNOSIS — K5521 Angiodysplasia of colon with hemorrhage: Secondary | ICD-10-CM

## 2012-07-14 DIAGNOSIS — K922 Gastrointestinal hemorrhage, unspecified: Secondary | ICD-10-CM

## 2012-07-14 DIAGNOSIS — M810 Age-related osteoporosis without current pathological fracture: Secondary | ICD-10-CM

## 2012-07-14 DIAGNOSIS — D509 Iron deficiency anemia, unspecified: Secondary | ICD-10-CM

## 2012-07-14 LAB — IRON AND TIBC
Iron: 37 ug/dL — ABNORMAL LOW (ref 42–145)
TIBC: 492 ug/dL — ABNORMAL HIGH (ref 250–470)
UIBC: 455 ug/dL — ABNORMAL HIGH (ref 125–400)

## 2012-07-14 LAB — CBC WITH DIFFERENTIAL/PLATELET
Basophils Absolute: 0.1 10*3/uL (ref 0.0–0.1)
EOS%: 1.1 % (ref 0.0–7.0)
HGB: 8.9 g/dL — ABNORMAL LOW (ref 11.6–15.9)
LYMPH%: 18.2 % (ref 14.0–49.7)
MCH: 25.8 pg (ref 25.1–34.0)
MCV: 81.2 fL (ref 79.5–101.0)
MONO%: 5.4 % (ref 0.0–14.0)
NEUT%: 74.6 % (ref 38.4–76.8)
Platelets: 312 10*3/uL (ref 145–400)
RDW: 20.7 % — ABNORMAL HIGH (ref 11.2–14.5)

## 2012-07-14 LAB — COMPREHENSIVE METABOLIC PANEL (CC13)
AST: 12 U/L (ref 5–34)
Alkaline Phosphatase: 78 U/L (ref 40–150)
BUN: 20 mg/dL (ref 7.0–26.0)
Creatinine: 0.7 mg/dL (ref 0.6–1.1)
Glucose: 167 mg/dl — ABNORMAL HIGH (ref 70–99)
Potassium: 3.9 mEq/L (ref 3.5–5.1)
Total Bilirubin: 0.23 mg/dL (ref 0.20–1.20)

## 2012-07-14 LAB — FERRITIN: Ferritin: 8 ng/mL — ABNORMAL LOW (ref 10–291)

## 2012-07-14 LAB — CHCC SMEAR

## 2012-07-14 NOTE — Progress Notes (Signed)
This office note has been dictated.  #[id number]

## 2012-07-14 NOTE — Telephone Encounter (Signed)
Patient has been scheduled for Infed Iron infusion 25 mg test dose followed by continuous infusion of pharmacy calculated dose over 4 hours @ [facility] for Friday, November 1 @ 2 pm. She should go straight to [facility] (per [place], park at back door where old emergency room is). I have attempted to contact patient but there is no answer or voicemail at the home number provided. I will attempt to reach her again at a later time.     ----- Message -----    From: Hart Carwin, MD    Sent: 07/13/2012   5:02 PM      To: Richardson Chiquito, CMA Subject: Iron infusion                                  Dottie, could you please set up an Iron infusion on [facility] [place] 25 mg IV test dose followed by an continuous infusion of Pharmacy calculated dose over 4 hours. Goal Hgb 13.0 Thank You

## 2012-07-14 NOTE — Progress Notes (Signed)
Checked in new pt with no financial concerns. °

## 2012-07-14 NOTE — Telephone Encounter (Signed)
Left message with female for patient to call back. 

## 2012-07-15 ENCOUNTER — Encounter: Payer: Self-pay | Admitting: Medical Oncology

## 2012-07-15 ENCOUNTER — Telehealth: Payer: Self-pay | Admitting: *Deleted

## 2012-07-15 ENCOUNTER — Other Ambulatory Visit: Payer: Self-pay | Admitting: Internal Medicine

## 2012-07-15 ENCOUNTER — Encounter: Payer: Self-pay | Admitting: Oncology

## 2012-07-15 ENCOUNTER — Telehealth: Payer: Self-pay | Admitting: Internal Medicine

## 2012-07-15 NOTE — Telephone Encounter (Signed)
Patient advised of iron infusion scheduled for 07/17/12 @ 2 pm at [facility]. Patient verbalizes understanding.

## 2012-07-15 NOTE — Progress Notes (Signed)
[name] received InFeD 1000 mg IV following a 25 mg test dose on 04/24/2012 when she was hospitalized. She tolerated the infusion without any problems. She is scheduled for another dose of InFeD on November 1.

## 2012-07-15 NOTE — Progress Notes (Unsigned)
Per Dr. Arline Asp he would like to have CBC drawn with pt's Iron infusion scheduled at [facility] Short Stay 07/17/12. I faxed orders to Short Stay and verified to [facility]. I also faxed orders to [facility] to have labs drawn 07/27/12 and weekly. Fax [phone] Phone [phone]. I called to verify they received the orders.

## 2012-07-15 NOTE — Telephone Encounter (Signed)
Mailed out calendar to inform the patient of the new date and time on 08-2012

## 2012-07-16 ENCOUNTER — Telehealth: Payer: Self-pay | Admitting: *Deleted

## 2012-07-16 ENCOUNTER — Other Ambulatory Visit: Payer: Self-pay | Admitting: *Deleted

## 2012-07-16 NOTE — Progress Notes (Signed)
CC:   Bevelyn Buckles. Bensimhon, MD Leslye Peer, MD Hedwig Morton. Juanda Chance, MD Ethelda Chick, MD, Fax [phone]  NEW PATIENT CONSULTATION  PROBLEM LIST: 1. Anemia secondary to iron deficiency and gastrointestinal bleeding     from colonic arteriovenous malformations. 2. Chronic obstructive pulmonary disease (COPD) diagnosed in 1999,     currently on home oxygen at 3 L/minute at rest and during sleep.     The patient uses 4 L per minute with exertion.  She has been on     home oxygen for approximately the past 5 years. 3. Pulmonary hypertension. 4. Right frontal arteriovenous malformation (AVM). 5. Coronary artery disease. 6. Diastolic dysfunction. 7. Past history of multiple strokes. 8. History of left carotid stenosis with left carotid endarterectomy     in 2006 at [facility]. 9. Hypertension. 10.Dyslipidemia. 11.Ductal carcinoma in situ involving the right breast, status post     lumpectomy, radiation, and 5 years of tamoxifen. 12.Peripheral vascular disease. 13.Hiatal hernia. 14.Status post thyroidectomy in 2009 or 2010. 15.Osteoporosis. 16.Stroke involving right eye in April 2011, now with vision restored. 17.Diverticulosis. 18.Hearing impairment. 19.Systolic ejection murmur.  MEDICATIONS: 1. Albuterol 2 puffs every 6 hours as needed. 2. Aspirin enteric-coated 81 mg 1 daily. 3. Lipitor 20 mg daily. 4. Symbicort 160/4.5, 2 puffs twice daily. 5. Calcium carbonate 1500 mg 1 tablet daily. 6. Cholecalciferol 1000 units daily. 7. Cardizem CD 120 mg daily. 8. Ferrous sulfate 325 mg daily.  I have instructed the patient to     stop this as she will be receiving intravenous iron in the future. 9. Fluticasone (Flonase) 2 sprays into the nose daily. 10.Lasix 20 mg daily. 11.Vicodin 5/500 mg 1 tablet every 4 hours as needed for pain. 12.Levothyroxine 50 mcg daily. 13.Multivitamins with minerals 1 capsule daily. 14.Protonix 40 mg twice daily. 15.Prednisone 5 mg  daily. 16.Spiriva 18-mcg inhalation capsule, 1 daily. 17.Detrol LA 2 mg daily.  SMOKING HISTORY:  The patient smoked 1 pack of cigarettes a day for 30 years.  She stopped smoking in the late 1990s.  HISTORY:  [name] is a 78 year old white female whom I am asked to see in consultation by Dr. Nicholes Mango for evaluation and management of anemia, felt to be due to ongoing or intermittent GI bleeding from colonic arteriovenous malformations.  This problem apparently first became evident in 2008, when the patient required admission to [facility] with hematochezia.  She required multiple blood transfusions.  She was told at that time that she had colonic AVMs. Apparently these were cauterized.  The patient did well for the next couple of years, but over the past few years, perhaps 2 or 3 years, she has had multiple blood transfusions, estimated to be about 20.  She has undergone approximately 4 colonoscopies, 3 endoscopies, and 1 camera study, the latter being carried out in the fall of 2012.  Apparently the patient has been noted to have arteriovenous malformations that are felt to be the cause of her bleedings.  She apparently has diverticulosis and a hiatal hernia.  She has been managed over the past several years by Dr. Manfred Shirts, a gastroenterologist in [facility] associated with [facility].  She has had frequent heme-positive stools. Occasionally she will have dark stools, but she has not had any obvious hematochezia.  She denies having had any melena within the past year. The patient tells me that she has had 3 admissions to [facility] within the last 12 months.  During these  admissions she usually has symptoms of shortness of breath or chest pain.  She usually has a low hemoglobin and requires anywhere from 3 to 4 units of packed red cells.  She apparently has not had any iron infusions and is not aware that her iron has been checked  during this time.  The patient required admission here in [place] from 04/23/2012 through 04/29/2012, when her hemoglobin was 6.2 and she was having chest pain.  She was seen in consultation by Dr. Lina Sar.  Ferritin on 04/25/2012 was 11.  The patient apparently received 4 units of packed red cells during this admission.  She also received intravenous iron.  I was unable to find the exact dose and preparation, but I am assuming that it was INFeD.  It was felt that the patient did not require any endoscopies or colonoscopy.  The patient was to have CBCs checked every 2 weeks for a while and then monthly.  On 05/01/2012, hemoglobin was 11.9, hematocrit 39.3.  On 05/15/2012, hemoglobin was 12.1, hematocrit 40.5.  Of note is the fact that the patient does run a leukocytosis with white counts that have been running between 13,000 and 15,000.  Her platelet count has been normal.  On 05/29/2012, hemoglobin was 12.3, hematocrit 39.6, white count was 15.5.  On 06/12/2012, hemoglobin was 10.9, hematocrit 36.2.  Again the patient has not noticed any obvious blood in her stools.  On 07/10/2012, just this past Friday, hemoglobin had dropped to 9.3, hematocrit 31.9.  Ferritin was 7, with an iron saturation of 3%.  Serum iron was 12, with a TIBC of 447.  The patient had those labs carried out at [facility], telephone number [phone].  Lab work apparently drawn this morning, 07/14/2012, showed a hemoglobin of 9.7, hematocrit 32.9, and a white count of 11.7.  Retic count was 2.1%.  The patient in general is doing well.  She is here today with her niece, [name].  Melissa lives in [place].  The patient lives in [place], apparently near [place].  The patient states that she is having a little bit more dyspnea on exertion than she did a couple of months ago; however, she denies any change in her status over the past 2 weeks.  She gets around with a  cane, which she holds in her left hand.  She denies any chest pain or chest discomfort with exertion.  She has noted a little bit more swelling of her ankles over the past couple of weeks.  The patient's blood work apparently is being monitored by Dr. Juanda Chance, who has arranged for the patient to have an infusion of INFeD at medical daycare at [facility] this Friday, 07/17/2012.  PAST MEDICAL HISTORY:  Can be obtained from the patient's problem list above.  PAST SURGICAL HISTORY:  The patient has had the following surgical procedures: 1. Appendectomy in the 1950s. 2. Hemorrhoid surgery in the 1960s. 3. Right breast lumpectomy in 2002. 4. Left carotid endarterectomy in 2006. 5. Thyroidectomy at [facility] for goiter in 2009. 6. Cataract surgery on her right eye in the summer of 2012. 7. As stated above, the patient has had multiple blood transfusions,     particularly within the past several years.  Her 1st blood     transfusions may have been in 2008. 8. She denies any significant injuries. 9. Medicines are listed above.  ALLERGIES:  She has allergy with skin sensitivity to latex.  FAMILY HISTORY:  A  brother died of colon cancer at age 71 in 48. Apparently 2 brothers have leukemia, type unknown, possibly CLL.  Family history is positive for COPD, stroke, heart disease, thyroid disorders.  SOCIAL HISTORY:  The patient was a 1-pack-a-day smoker for 30 years, but stopped smoking in the late 1990s.  No history of alcohol use.  She was born in [place].  Had several jobs, most recently a Production designer, theatre/television/film for an apartment complex.  She retired in the early 2000s at age 62.  She currently lives in [place].  She has been driving short distances, but does not drive on the highway.  She lives in a 1-level home.  Her older sister lives with her.  The patient is oxygen- dependent, but gets around fairly well.  REVIEW OF SYSTEMS:  The patient's main problem is dyspnea on  exertion, which she says is a little worse now than it was 2 months ago.  She denies any headaches, dizziness, loss of consciousness.  She denies any weakness in her arms or legs.  She had some visual loss in her right eye, but that apparently has resolved.  She does have hearing impairment and has hearing aids, which she does not wear.  She has noted some loss of taste in the past few weeks.  She still can detect sweetness and salty tastes.  Her normal weight is about 160 pounds.  She denies any GI problems.  No history of melena or hematochezia.  She does have a tendency toward constipation.  She denies any exertional chest discomfort or palpitations.  She does have a murmur.  She denies any urinary difficulties.  She denies any cough or hemoptysis.  No epistaxis.  She does have arthritis fairly generalized.  She bruises with trauma.  She denies any undue bleeding.  No history of blood clots. She does have some swelling of her ankles.  She denies any skin rashes or any history of depression.  Her niece, [name], thought she had some residual word-finding problems.  The patient does use a cane in her left hand.  PHYSICAL EXAMINATION:  General:  Ms. Lorensen is a generally well- appearing woman of 21.  She needed some help getting up and down from the examining table.  Vital Signs:  Weight today is 165.7 pounds, height 5 feet 6-1/2 inches, body surface area 1.88 meters squared.  Blood pressure 130/64.  Other vital signs are normal.  I did not check her O2 saturation.  She is on 3 L of nasal oxygen per minute.  HEENT:  She wears glasses.  There is evidence of cataract surgery involving the right eye.  No scleral icterus.  Pupillary and extraocular movements are normal.  Mouth and pharynx are benign.  Teeth are in fairly good condition.  Neck:  Notable for a scar from her thyroid surgery and her left carotid endarterectomy.  No adenopathy, thyroid enlargement; however, there was either a  right carotid bruit or transmitted murmur. Lungs:  Notable for some bibasilar inspiratory rales.  Cardiac:  Regular rhythm, with systolic ejection murmur.  Back:  No skeletal tenderness or deformity.  Breasts:  The right breast is slightly smaller than the left breast.  Radiation changes are inapparent.  Breasts are soft, without any suspicious findings.  There is a well-healed scar in the upper outer quadrant.  The left breast is benign.  No axillary adenopathy.  Back: No skeletal tenderness.  Abdomen:  Somewhat obese, nontender, with no organomegaly or masses palpable.  Extremities:  Puffy, right greater than left, with minimal pitting.  The patient had a couple of bruises on her left arm.  No definite arthritic changes in the hands or fingers. Neuro:  Neurologic exam showed good strength throughout, without any focal findings.  LABORATORY DATA TODAY:  White count 10.2, ANC 7.6, hemoglobin 8.9, hematocrit 28.1, platelets 312,000.  MCV 81.2, MCH 25.8.  Differential: White cell differential was normal.  Inspection of the peripheral smear showed giant platelets and generally large platelets throughout.  Red cells were only mildly suggestive of some hypochromia.  The peripheral smear did not floridly suggest an iron-deficiency anemia.  The white cells were fairly unremarkable.  No dyspoietic changes.  Chemistries were notable for a glucose of 167, BUN 20, creatinine 0.7.  Albumin 4.0, LDH 199, and normal liver function tests.  Pending are iron TIBC and ferritin.  IMAGING STUDIES: 1. Chest x-ray, 2 view, from 03/06/2012 showed slight cardiac     silhouette enlargement.  There was overall hyperinflation     consistent with COPD.  No pulmonary edema, pneumonia, or pleural     effusion.  There was a large cyst or bulla, stable at the right     upper lobe.  There were stable midthoracic vertebral body     compression fractures, and the bones appeared osteopenic. 2. Myocardial imaging with  SPECT gated left ventricular wall motion     study carried out on 04/27/2012 showed a small area of     reversibility involving the anterior apical wall.  Findings raise     concern for a small area of pharmacologically-induced ischemia.     Calculated ejection fraction was 40% with mild hypokinesia.  IMPRESSION AND PLAN:  The patient's history is certainly most suggestive of chronic gastrointestinal bleeding secondary to arteriovenous malformations, which apparently are mostly localized in the colon.  The patient appears to have resulting iron deficiency from her ongoing GI bleeding.  It appears that in the past the iron deficiency problem has not been adequately addressed.  The patient apparently did receive IV iron while hospitalized here in [place] in August.  The patient also has received multiple blood transfusions over the past couple of years.  After having a nice response to her iron infusions and transfusions, the patient once again has become anemic and iron deficient.  Dr. Juanda Chance has scheduled the patient for intravenous INFeD this coming Friday, 07/17/2012.  The patient has lab work scheduled at the [facility] on 07/27/2012.  I believe we should continue to follow CBC and ferritin fairly closely.  I am recommending following these parameters on a weekly basis, at least for now, so I can get a better sense of the magnitude of her GI bleeding.  Apparently she tolerated her IV iron infusion without any problems back in August.  The patient is currently on 1 iron tablet a day.  I have told her that she can discontinue this since oral iron is really not going to be effective in dealing with this problem.  It would appear that the patient is going to need ongoing IV iron supplementation and careful conscientious monitoring of her CBC and iron stores.  All of this was explained in detail to the patient and her niece, [name].  I plan to be in contact  with Dr. Juanda Chance and the patient's primary physician, Dr. Ethelda Chick.  In fact, I tried to reach Dr. Juanda Chance today, but she was out of the office.  I am more than  happy to try to assist with the patient's management. Hopefully, if we can maintain her iron stores, we may not need to give her red cell transfusions unless the magnitude of her GI bleeding exceeds her synthetic capacity.  All of this has been carefully reviewed with the patient and her niece. We will arrange for additional lab studies to be carried out close to the patient's home.  I have asked the patient to return to see me around 08/25/2012, at which time we will check CBC, chemistries, and iron studies.  At this point, I do not think the patient has an underlying hematologic problem.  Her bone marrow seems to be functioning adequately when she is repleted with iron.    ______________________________ Samul Dada, M.D. DSM/MEDQ  D:  07/14/2012  T:  07/15/2012  Job:  [id number]

## 2012-07-16 NOTE — Telephone Encounter (Signed)
Called to clarify orders on mutual patient with Dr Lina Sar for IV iron infusion on 07/17/12. Orders incomplete at this time. Dr Arline Asp spoke with Elie Goody, PharmD at [facility], patient will receive Feraheme instead of Infed. Orders per [facility].

## 2012-07-17 ENCOUNTER — Encounter (HOSPITAL_COMMUNITY)
Admission: RE | Admit: 2012-07-17 | Discharge: 2012-07-17 | Disposition: A | Discharge status: Home / Self Care | Payer: Medicare Other | Source: Ambulatory Visit | Attending: Internal Medicine | Admitting: Internal Medicine

## 2012-07-17 ENCOUNTER — Encounter: Payer: Self-pay | Admitting: Oncology

## 2012-07-17 ENCOUNTER — Encounter (HOSPITAL_COMMUNITY): Payer: Self-pay

## 2012-07-17 DIAGNOSIS — D649 Anemia, unspecified: Secondary | ICD-10-CM | POA: Insufficient documentation

## 2012-07-17 HISTORY — DX: Pneumonia, unspecified organism: J18.9

## 2012-07-17 LAB — CBC WITH DIFFERENTIAL/PLATELET
Basophils Relative: 1 % (ref 0–1)
HCT: 30.1 % — ABNORMAL LOW (ref 36.0–46.0)
Hemoglobin: 9 g/dL — ABNORMAL LOW (ref 12.0–15.0)
Lymphocytes Relative: 12 % (ref 12–46)
Lymphs Abs: 1.2 10*3/uL (ref 0.7–4.0)
MCHC: 29.9 g/dL — ABNORMAL LOW (ref 30.0–36.0)
Monocytes Absolute: 0.7 10*3/uL (ref 0.1–1.0)
Monocytes Relative: 7 % (ref 3–12)
Neutro Abs: 8.1 10*3/uL — ABNORMAL HIGH (ref 1.7–7.7)
Neutrophils Relative %: 80 % — ABNORMAL HIGH (ref 43–77)
RBC: 3.59 MIL/uL — ABNORMAL LOW (ref 3.87–5.11)

## 2012-07-17 MED ORDER — SODIUM CHLORIDE 0.9 % IV SOLN
Freq: Once | INTRAVENOUS | Status: AC
  Administered 2012-07-17: 15:00:00 via INTRAVENOUS

## 2012-07-17 MED ORDER — SODIUM CHLORIDE 0.9 % IV SOLN
1020.0000 mg | Freq: Once | INTRAVENOUS | Status: AC
  Administered 2012-07-17: 1020 mg via INTRAVENOUS
  Filled 2012-07-17: qty 34

## 2012-07-17 NOTE — Progress Notes (Signed)
Patient was to receive Feraheme 1020 mg IV today.

## 2012-07-20 ENCOUNTER — Ambulatory Visit: Payer: Medicare Other

## 2012-07-20 ENCOUNTER — Other Ambulatory Visit: Payer: Medicare Other | Admitting: Lab

## 2012-07-20 ENCOUNTER — Ambulatory Visit: Payer: Medicare Other | Admitting: Oncology

## 2012-07-24 NOTE — Progress Notes (Signed)
Opened in error

## 2012-07-29 ENCOUNTER — Encounter: Payer: Self-pay | Admitting: Oncology

## 2012-07-29 NOTE — Telephone Encounter (Signed)
Dr Juanda Chance, did you ever speak to Dr Arline Asp about this patient?

## 2012-07-29 NOTE — Progress Notes (Signed)
We have labs that were sent to [place] from the [facility] date 07/27/2012.  Hemoglobin was 10.7, hematocrit 34.8 and ferritin was 357.  It will be recalled that the patient received Feraheme 1020 mg IV on 07/17/2012.  We are checking CBC and ferritin on a weekly basis. The patient should be seeing me around 08/25/2012.

## 2012-07-29 NOTE — Telephone Encounter (Signed)
I  Did discuss pt with DR Murinson  Last week before her Iron infusion and will take over her anemia  Treatment and Iron infusions., so we don't have to call her with results.

## 2012-08-03 ENCOUNTER — Encounter: Payer: Self-pay | Admitting: Emergency Medicine

## 2012-08-03 ENCOUNTER — Ambulatory Visit (INDEPENDENT_AMBULATORY_CARE_PROVIDER_SITE_OTHER): Payer: Medicare Other | Admitting: Emergency Medicine

## 2012-08-03 VITALS — BP 112/60 | HR 98 | Temp 97.9°F | Ht 66.5 in | Wt 165.8 lb

## 2012-08-03 DIAGNOSIS — J449 Chronic obstructive pulmonary disease, unspecified: Secondary | ICD-10-CM

## 2012-08-03 MED ORDER — PREDNISONE 1 MG PO TABS: ORAL_TABLET | ORAL | Status: DC

## 2012-08-03 NOTE — Progress Notes (Signed)
Subjective:    Patient ID: [name], female    DOB: 1933-05-15, 78 y.o.   MRN: [id number] HPI 110 former smoker, dx with COPD about 1999, also hx breast CA, CVA with R frontal AVM, colonic AVM's. Also diastolic dysfxn followed Dr Gala Romney. Has been treated with Spiriva + Advair, since 2003. Now on Symbicort  September 21, 2010--Presents for an acute office visit. Complains of wheezing, increased SOB, dry cough x2-3days. Complains of last 4-5 months has been on abx and steroids each month. As soon as she finishes her course symptoms begin to come back with cough, wheezing and dyspnea. Last 2 days, dry cough w/ wheezing. No discolored mucus. Denies chest pain, orthopnea, hemoptysis, fever, n/v/d, edema, headache.   10/30/10 -- follow up severe COPD. Flare last visit, treated with pred and abx, changed Advair to Symbicort to see if this helped with UA irritation and cough. We left her on Pred 10mg  by mouth once daily with plans to f/u and decide whether to stay on chronically.   02/14/11 Acute OV  Pt presents for a work in visit. Complains of increased SOB w/ exertion and sats into 70s w/ exertion, HR varying from the upper 40s to the 120s. Pt complains that over last couple of weeks she feels tired , low energy and more short of breath with walking. She has marathon helios that she has noticed she desats with walking into the 70s. She uses the demand setting. On the continuous flow she does not desat. Today in the office no desaturation with walking on 3 l/m continuous flow. HR ranges 100-120 with walking and at rest. On her portable oximeter her HR avg 40-120 , in the office HR maintained ~106-120 with walking . EKG w/ no acute changes w/ HR ~90 at rest. Does feel tight in chest with walking. Has some occasional wheezing. No discolored mucus or fever. Ankles have been puffy for last several days.  Recently changed from her bisoprolol to cardizem by cardiology.  ROV 03/05/11 -- severe COPD w hypoxemia. As  above, recently seen for DOE and labile HR. Since last time was admitted for anemia, Hgb 6.0. In after math feels her breathing and fatigue are improved. Returns today for f/u. She had to have AVM's cauterized. Has been on chronic pred for last 6 months, currently on 5mg  daily. Her goal is to taper to zero. Currently on Symbicort, Spiriva, uses SABA prn not every day.   ROV 04/03/11 -- severe COPD. Last time we decreased Pred to 3mg  qd. Since our visit she was readmitted for anemia, she is being followed by GI (Dr Mechele Collin). She has had endoscopy, CSY, capsule endoscopy. She feels washed out from the anemia. No real breathing change, wheezing, coughing.   ROV 06/04/11 - written note. Decreased Pred to 1mg  qd.  ROV 07/29/11 -- severe COPD with hypoxemia, tapering steroids. Last time we decreased to 1mg  daily.  She presents with worsening dyspnea, wheeze. No real cough. Her SABA use has increased over the last week. She is less mobile in the home, needed wheelchair.  >>admitted   [facility]  Admitted 11/12-11/15 for AECOPD , tx w/ IV abx , steroids and nebs. CXR without acute process.  Discharged on zpack and steroid taper. She is feeling better with decreased cough and dyspnea  Continued on Spiriva and Symbicort .  Currently on prednisone 10mg  daily  No chest pain , discolored mucus or fever   ROV 09/02/11 -- Severe COPD, has been tapering chronic  pred. Hosp for AE in early Nov '12. Prednisone is down to 5mg  now. She has experienced B LE edema over the last month (? Can due to pred).   ROV 10/03/11 -- Severe COPD, has been tapering chronic pred. Last time we decreased pred from 5mg  to 3mg  daily. She tells me that she is still having problems w anemia (has had colonic AVM cauterized before). Hx of diastolic dysfxn, ? R heart failure. She is planning to see cards today, may need diuretics adjusted.   ROV 12/03/11 -- Severe COPD, diastolic dysfxn, tapering chronic pred currently on 3mg . She  is on Zyrtec, has been having some red eyes. Wearing O2 at night and prn. On spiriva + symbicort + SABA, averages once every few weeks.   ROV 01/28/12 -- Severe COPD, diastolic dysfxn, tapering chronic pred, last time decreased to 2mg  qd. She is a bit less active over the last year. Having good days and bad. She uses her O2 prn and at night. She has not flared.   02/24/2012 Acute OV  Complains of productive cough,sob ,wheezing for 4 days.  Barky cough , worse for last few days Currently on prednisone 2mg   No hemotpysis or increased edema. No fever.   ROV 03/06/12 -- Severe COPD, diastolic dysfxn, tag pering chronic pred, last time decreased to 2mg  qd.  She was recently  Caught URI, was seen and treated as above. Now tapered back down to 2mg  Pred qd.  She continues to have cough and fatigue. The cough is non-prod. She is still having nasal gtt and throat congestion.   Follow up 03/13/12 -  Last visit with COPD flare , tx Levaquin and Prednisone taper  Returns today feeling better. Has few days left of prednisone. Last day of Levaquin.  CXR last ov w/ no acute finding.  Cough and wheezing are decreased but not gone.  Mucus is clear only. No fever.  Currently on prednisone 20mg  daily  No hemoptysis or edema.   ROV 04/23/12 -- Severe COPD, HTN + diastolic dysfxn, hypoxemia. Has been on chronic pred, recent AE as above. Tapered down to Pred 5mg  since another burst 6/28. She presents today with worsening SOB, chest pain especially with exertion or talking. This has been happening for about 1.5 week. She also has more LE edema, more wheezing. She has increased her ProAir use but it hasn't helped very much. Taking protonix qd.    ROV 06/01/12 -- Severe COPD, HTN + diastolic dysfxn, hypoxemia. Has been on chronic pred. Last time I saw her [facility] related to anemia. She is having exertional CP and dyspnea. Her last Hgb (her report) was 12.2. Seen by Dr Juanda Chance in the  hospital.   ROV 08/03/12 -- Severe COPD, HTN + diastolic dysfxn, hypoxemia. Also with CAD and hx symptomatic anemia. Has been on chronic pred > current dose 5mg . Stable breathing (still w DOE). No flares since last hospitalization. Recently started on diuretic by PCP, following wt.   Objective:  Physical Exam Filed Vitals:   08/03/12 1330  BP: 112/60  Pulse: 98  Temp: 97.9 F (36.6 C)   GEN: A/Ox3; pleasant , NAD, elderly female   HEENT:  Foley/AT,  EACs-clear, TMs-wnl, NOSE-clear, THROAT-clear, no lesions, no postnasal drip or exudate noted.   NECK:  Supple w/ fair ROM; no JVD; normal carotid impulses w/o bruits; no thyromegaly or nodules palpated; no lymphadenopathy.  RESP  Few soft basilar insp crackles B, no wheeze, good  air movement  CARD:  RRR, Gr 1/6 SM, trace ankle edema, pulses intact, no cyanosis or clubbing.  Musco: Warm bil, no deformities or joint swelling noted.   Neuro: alert, no focal deficits noted.    Skin: Warm, no lesions or rashes    Assessment & Plan:   COPD - Spiriva + Symbicort.  - decrease pred from 5 to 3mg  - 3l/min, to 4L/min w exertion  - had the flu shot - rov 1 month

## 2012-08-03 NOTE — Assessment & Plan Note (Addendum)
-   Spiriva + Symbicort.  - decrease pred from 5 to 3mg  - 3l/min, to 4L/min w exertion  - had the flu shot - rov 1 month

## 2012-08-03 NOTE — Patient Instructions (Addendum)
Please change your prednisone to 3mg  daily Continue your oxygen and your inhaled medications as you are taking them  Follow with Dr Delton Coombes in 1 month

## 2012-08-11 ENCOUNTER — Encounter: Payer: Self-pay | Admitting: Oncology

## 2012-08-11 NOTE — Progress Notes (Unsigned)
We received labs dated 08/10/2012 carried out at [facility].  Ferritin was 95. Hemoglobin 12.8. Hematocrit 41.2. White count 10.4. Platelet count 301k. MCV 88. MCH 27.2.  The patient is to be having CBC and ferritin checked weekly. She is scheduled to see me on 08/18/2012.

## 2012-08-18 ENCOUNTER — Other Ambulatory Visit (HOSPITAL_BASED_OUTPATIENT_CLINIC_OR_DEPARTMENT_OTHER): Payer: Medicare Other | Admitting: Lab

## 2012-08-18 ENCOUNTER — Telehealth: Payer: Self-pay | Admitting: Oncology

## 2012-08-18 ENCOUNTER — Encounter: Payer: Self-pay | Admitting: Oncology

## 2012-08-18 ENCOUNTER — Ambulatory Visit (HOSPITAL_BASED_OUTPATIENT_CLINIC_OR_DEPARTMENT_OTHER): Payer: Medicare Other | Admitting: Oncology

## 2012-08-18 VITALS — BP 127/63 | HR 91 | Temp 97.8°F | Resp 20 | Ht 65.5 in | Wt 166.1 lb

## 2012-08-18 DIAGNOSIS — K5521 Angiodysplasia of colon with hemorrhage: Secondary | ICD-10-CM

## 2012-08-18 DIAGNOSIS — M810 Age-related osteoporosis without current pathological fracture: Secondary | ICD-10-CM

## 2012-08-18 DIAGNOSIS — D500 Iron deficiency anemia secondary to blood loss (chronic): Secondary | ICD-10-CM

## 2012-08-18 DIAGNOSIS — D649 Anemia, unspecified: Secondary | ICD-10-CM

## 2012-08-18 DIAGNOSIS — D509 Iron deficiency anemia, unspecified: Secondary | ICD-10-CM

## 2012-08-18 DIAGNOSIS — I2510 Atherosclerotic heart disease of native coronary artery without angina pectoris: Secondary | ICD-10-CM

## 2012-08-18 LAB — COMPREHENSIVE METABOLIC PANEL (CC13)
ALT: 17 U/L (ref 0–55)
AST: 18 U/L (ref 5–34)
CO2: 32 mEq/L — ABNORMAL HIGH (ref 22–29)
Calcium: 9 mg/dL (ref 8.4–10.4)
Chloride: 101 mEq/L (ref 98–107)
Creatinine: 0.8 mg/dL (ref 0.6–1.1)
Sodium: 144 mEq/L (ref 136–145)
Total Protein: 6.8 g/dL (ref 6.4–8.3)

## 2012-08-18 LAB — IRON AND TIBC: UIBC: 307 ug/dL (ref 125–400)

## 2012-08-18 LAB — CBC WITH DIFFERENTIAL/PLATELET
Basophils Absolute: 0.1 10*3/uL (ref 0.0–0.1)
EOS%: 1.1 % (ref 0.0–7.0)
Eosinophils Absolute: 0.1 10*3/uL (ref 0.0–0.5)
HCT: 38.5 % (ref 34.8–46.6)
HGB: 12.4 g/dL (ref 11.6–15.9)
LYMPH%: 15.5 % (ref 14.0–49.7)
MCH: 28.6 pg (ref 25.1–34.0)
MCV: 89.2 fL (ref 79.5–101.0)
MONO%: 5.3 % (ref 0.0–14.0)
NEUT#: 7.7 10*3/uL — ABNORMAL HIGH (ref 1.5–6.5)
NEUT%: 77.4 % — ABNORMAL HIGH (ref 38.4–76.8)
Platelets: 224 10*3/uL (ref 145–400)
RDW: 20.8 % — ABNORMAL HIGH (ref 11.2–14.5)

## 2012-08-18 LAB — FERRITIN: Ferritin: 35 ng/mL (ref 10–291)

## 2012-08-18 LAB — LACTATE DEHYDROGENASE (CC13): LDH: 204 U/L (ref 125–245)

## 2012-08-18 NOTE — Telephone Encounter (Signed)
gve the pt her jan 2014 appt calendar °

## 2012-08-18 NOTE — Patient Instructions (Signed)
We need to check CBC and ferritin every 2 weeks.  You will need more IV Feraheme if ferritin is decreasing to low levels.

## 2012-08-19 ENCOUNTER — Encounter: Payer: Self-pay | Admitting: Oncology

## 2012-08-19 NOTE — Progress Notes (Signed)
This office note has been dictated.  #[id number]

## 2012-08-19 NOTE — Progress Notes (Signed)
This patient's ferritin came back 35 with an iron saturation of 14% on 08/18/2012.  We're setting her up for IV Feraheme 1020 mg which we hope to carry out on Friday, 08/21/2012.

## 2012-08-20 ENCOUNTER — Telehealth: Payer: Self-pay | Admitting: *Deleted

## 2012-08-20 ENCOUNTER — Telehealth: Payer: Self-pay | Admitting: Oncology

## 2012-08-20 NOTE — Telephone Encounter (Signed)
S/w pt re appt for 12/6.

## 2012-08-20 NOTE — Telephone Encounter (Signed)
Per staff phone call and POF I have scheduled appt. JMW  

## 2012-08-21 ENCOUNTER — Ambulatory Visit (HOSPITAL_BASED_OUTPATIENT_CLINIC_OR_DEPARTMENT_OTHER): Payer: Medicare Other

## 2012-08-21 VITALS — BP 136/74 | HR 95 | Temp 98.7°F | Resp 18

## 2012-08-21 DIAGNOSIS — D509 Iron deficiency anemia, unspecified: Secondary | ICD-10-CM

## 2012-08-21 DIAGNOSIS — D649 Anemia, unspecified: Secondary | ICD-10-CM

## 2012-08-21 DIAGNOSIS — K5521 Angiodysplasia of colon with hemorrhage: Secondary | ICD-10-CM

## 2012-08-21 MED ORDER — SODIUM CHLORIDE 0.9 % IV SOLN
1020.0000 mg | Freq: Once | INTRAVENOUS | Status: AC
  Administered 2012-08-21: 1020 mg via INTRAVENOUS
  Filled 2012-08-21: qty 34

## 2012-08-21 MED ORDER — SODIUM CHLORIDE 0.9 % IV SOLN
Freq: Once | INTRAVENOUS | Status: AC
  Administered 2012-08-21: 15:00:00 via INTRAVENOUS

## 2012-08-21 NOTE — Patient Instructions (Addendum)
Ferumoxytol injection What is this medicine? FERUMOXYTOL is an iron complex. Iron is used to make healthy red blood cells, which carry oxygen and nutrients throughout the body. This medicine is used to treat iron deficiency anemia in people with chronic kidney disease. This medicine may be used for other purposes; ask your health care provider or pharmacist if you have questions. What should I tell my health care provider before I take this medicine? They need to know if you have any of these conditions: -anemia not caused by low iron levels -high levels of iron in the blood -magnetic resonance imaging (MRI) test scheduled -an unusual or allergic reaction to iron, other medicines, foods, dyes, or preservatives -pregnant or trying to get pregnant -breast-feeding How should I use this medicine? This medicine is for infusion into a vein. It is given by a health care professional in a hospital or clinic setting. Talk to your pediatrician regarding the use of this medicine in children. Special care may be needed. Overdosage: If you think you've taken too much of this medicine contact a poison control center or emergency room at once. Overdosage: If you think you have taken too much of this medicine contact a poison control center or emergency room at once. NOTE: This medicine is only for you. Do not share this medicine with others. What if I miss a dose? It is important not to miss your dose. Call your doctor or health care professional if you are unable to keep an appointment. What may interact with this medicine? This medicine may interact with the following medications: -other iron products This list may not describe all possible interactions. Give your health care provider a list of all the medicines, herbs, non-prescription drugs, or dietary supplements you use. Also tell them if you smoke, drink alcohol, or use illegal drugs. Some items may interact with your medicine. What should I watch  for while using this medicine? Visit your doctor or healthcare professional regularly. Tell your doctor or healthcare professional if your symptoms do not start to get better or if they get worse. You may need blood work done while you are taking this medicine. You may need to follow a special diet. Talk to your doctor. Foods that contain iron include: whole grains/cereals, dried fruits, beans, or peas, leafy green vegetables, and organ meats (liver, kidney). What side effects may I notice from receiving this medicine? Side effects that you should report to your doctor or health care professional as soon as possible: -allergic reactions like skin rash, itching or hives, swelling of the face, lips, or tongue -breathing problems -changes in blood pressure -feeling faint or lightheaded, falls -fever or chills -flushing, sweating, or hot feelings -swelling of the ankles or feet Side effects that usually do not require medical attention (Report these to your doctor or health care professional if they continue or are bothersome.): -diarrhea -headache -nausea, vomiting -stomach pain This list may not describe all possible side effects. Call your doctor for medical advice about side effects. You may report side effects to FDA at 1-800-FDA-1088. Where should I keep my medicine? This drug is given in a hospital or clinic and will not be stored at home. NOTE: This sheet is a summary. It may not cover all possible information. If you have questions about this medicine, talk to your doctor, pharmacist, or health care provider.  2012, Elsevier/Gold Standard. (05/25/2008 9:48:25 PM) 

## 2012-08-21 NOTE — Progress Notes (Signed)
CC:   Bevelyn Buckles. Bensimhon, MD Leslye Peer, MD Hedwig Morton. Juanda Chance, MD Ethelda Chick, MD, Fax [phone]  PROBLEM LIST:  1. Anemia secondary to iron deficiency and gastrointestinal bleeding  from colonic arteriovenous malformations.  2. Chronic obstructive pulmonary disease (COPD) diagnosed in 1999,  currently on home oxygen at 3 L/minute at rest and during sleep.  The patient uses 4 L per minute with exertion. She has been on  home oxygen for approximately the past 5 years.  3. Pulmonary hypertension.  4. Right frontal arteriovenous malformation (AVM).  5. Coronary artery disease.  6. Diastolic dysfunction.  7. Past history of multiple strokes.  8. History of left carotid stenosis with left carotid endarterectomy  in 2006 at [facility].  9. Hypertension.  10.Dyslipidemia.  11.Ductal carcinoma in situ involving the right breast, status post  lumpectomy, radiation, and 5 years of tamoxifen.  12.Peripheral vascular disease.  13.Hiatal hernia.  14.Status post thyroidectomy in 2009 or 2010.  15.Osteoporosis.  16.Stroke involving right eye in April 2011, now with vision restored.  17.Diverticulosis.  18.Hearing impairment.  19.Systolic ejection murmur.    MEDICATIONS:  1. Albuterol 2 puffs every 6 hours as needed.  2. Aspirin enteric-coated 81 mg 1 daily.  3. Lipitor 20 mg daily.  4. Symbicort 160/4.5, 2 puffs twice daily.  5. Calcium carbonate 1500 mg 1 tablet daily.  6. Cholecalciferol 1000 units daily.  7. Cardizem CD 120 mg daily.  8. Ferrous sulfate 325 mg daily. I have instructed the patient to  stop this as she will be receiving intravenous iron in the future.  9. Fluticasone (Flonase) 2 sprays into the nose daily.  10.Lasix 20 mg daily.  11.Vicodin 5/500 mg 1 tablet every 4 hours as needed for pain.  12.Levothyroxine 50 mcg daily.  13.Multivitamins with minerals 1 capsule daily.  14.Protonix 40 mg twice daily.  15.Prednisone 3 mg daily.  On the patient's last  visit here on 07/14/2012 she was taking 5 mg daily.  16.Spiriva 18-mcg inhalation capsule, 1 daily.  17.Detrol LA 2 mg daily.    SMOKING HISTORY: The patient smoked 1 pack of cigarettes a day for 30  years. She stopped smoking in the late 1990s.    HISTORY: I saw Dalesha Stanback today for followup of her history of anemia secondary to iron deficiency and GI bleeding, presumed to be from colonic arteriovenous malformations.  The patient is accompanied today again by her niece, [name].  It will be recalled that the patient was seen for initial consultation on 07/14/2012.  Hemoglobin was 8.9 and hematocrit 28.1.  Ferritin on that date was 8.  The patient received IV Feraheme 1020 mg on 07/17/2012.  She tolerated this infusion well.  The patient has had a progressive increase in her hemoglobin and hematocrit. Originally we had asked that her blood work, consisting of CBC and ferritin, be carried out on a weekly basis.  Blood work was to be carried out at [facility].  The patient states that her blood was drawn on a weekly basis.  However, we have labs from 07/27/2012 and 08/10/2012, i.e., every 2 weeks.  On 07/27/2012 the hemoglobin was 10.7, hematocrit 34.8, and the ferritin was 357, following the IV Feraheme.  On 08/10/2012 the hemoglobin was 12.8, hematocrit 41.2, and the ferritin had dropped to 95.  The patient has felt well.  She had an appointment to see Dr. Levy Pupa a couple of weeks ago, and he apparently cut her  prednisone dose from 5 mg daily to 3 mg daily.  In addition, the patient's O2 saturation in his office was quite good, and she has been using oxygen during the day only as needed. She does use oxygen 3 L/minute at night.  She is here today not using any oxygen at all.  She has 1 bowel movement a day and describes them as brown.  She sees no evidence of blood or black stools.  In the past her stools have been grossly positive.  As  noted in my dictated summary from 07/14/2012, the patient has undergone a number of colonoscopies and endoscopies recently, as well as a camera study of the small bowel.  At the present time the patient feels quite well and is without any complaints.  As stated, she is here today without her oxygen.  PHYSICAL EXAMINATION:  Vital Signs:  Weight is stable at 166.1 pounds, height 5 feet 5-1/2 inches, body surface area 1.87 meters squared. Blood pressure 127/63.  Other vital signs are normal.  General:  The patient is in no acute distress.  Her physical exam is essentially unchanged from that recorded on 07/14/2012.  LABORATORY DATA:  Laboratory data from 08/18/2012:  White count 9.9, ANC 7.7, hemoglobin 12.4, hematocrit 38.5, platelets 224,000.  Chemistries were notable for a glucose of 167, BUN 16, creatinine 0.8.  CO2 content was 32, which is basically unchanged.  Albumin 3.9.  LDH 204.  Ferritin came back 35 and iron saturation 14%.  IMAGING STUDIES:  1. Chest x-ray, 2 view, from 03/06/2012 showed slight cardiac  silhouette enlargement. There was overall hyperinflation  consistent with COPD. No pulmonary edema, pneumonia, or pleural  effusion. There was a large cyst or bulla, stable at the right  upper lobe. There were stable midthoracic vertebral body  compression fractures, and the bones appeared osteopenic.  2. Myocardial imaging with SPECT gated left ventricular wall motion  study carried out on 04/27/2012 showed a small area of  reversibility involving the anterior apical wall. Findings raise  concern for a small area of pharmacologically-induced ischemia.  Calculated ejection fraction was 40% with mild hypokinesia.   IMPRESSION AND PLAN:  Today's session was spent in a rather extensive discussion with the patient and her niece.  We reviewed in detail the lab results over the past month and again our thoughts about why the patient has had problems with her anemia, which was in  the past being supported with red cell transfusions.  It will be recalled that the patient did receive IV iron when hospitalized here in [place] in August and, as noted above, she received IV Feraheme 1020 mg on 07/17/2012.  Once again, her ferritin has dropped down to 35, and the patient will need additional IV iron before she becomes anemic again. Again we reviewed all of this in detail with the patient and her niece.  We will be setting Ms. Mcauliffe up for another Feraheme infusion 1020 mg within the next few days.  The patient does not want to have blood drawn every week.  She has agreed to have blood work done every 2 weeks.  We will [facility] to set this up at [facility].  The patient will have CBC and ferritin every 2 weeks.  We are asking her to return for  an appointment with [place] around 10/12/2012, at which time we will check CBC, chemistries, iron, TIBC, and ferritin.  The patient will be seeing Norina Buzzard on that visit.  I  suspect the patient will continue to need infusions of IV Feraheme on a fairly frequent basis.  I should mention that we did also give her stool cards so that we can check for occult blood to provide definitive documentation that she is, indeed, having significant GI blood losses.    ______________________________ Samul Dada, M.D. DSM/MEDQ  D:  08/19/2012  T:  08/20/2012  Job:  [id number]

## 2012-09-08 ENCOUNTER — Encounter: Payer: Self-pay | Admitting: Oncology

## 2012-09-08 NOTE — Progress Notes (Signed)
Lab work from 08/31/2012 was as follows:   Ferritin was 837. White count 12.7.  Hemoglobin 13.5. Hematocrit 43.2. Platelet count 277,000.  This patient received IV Feraheme 1020 mg on 08/21/2012 when her ferritin came back 35 on 08/18/2012.  She is currently having blood work done every 2 weeks. She has an appointment to see [place] again in late January.

## 2012-09-15 ENCOUNTER — Telehealth: Payer: Self-pay

## 2012-09-15 NOTE — Telephone Encounter (Signed)
Labs received from [facility] and forwarded to Dr Arline Asp

## 2012-09-17 ENCOUNTER — Ambulatory Visit (INDEPENDENT_AMBULATORY_CARE_PROVIDER_SITE_OTHER): Payer: Medicare Other | Admitting: Emergency Medicine

## 2012-09-17 ENCOUNTER — Ambulatory Visit (HOSPITAL_BASED_OUTPATIENT_CLINIC_OR_DEPARTMENT_OTHER): Payer: Medicare Other | Admitting: Lab

## 2012-09-17 ENCOUNTER — Encounter: Payer: Self-pay | Admitting: Emergency Medicine

## 2012-09-17 VITALS — BP 110/58 | HR 85 | Temp 97.9°F | Ht 66.5 in | Wt 166.0 lb

## 2012-09-17 DIAGNOSIS — D649 Anemia, unspecified: Secondary | ICD-10-CM

## 2012-09-17 DIAGNOSIS — J4489 Other specified chronic obstructive pulmonary disease: Secondary | ICD-10-CM

## 2012-09-17 DIAGNOSIS — J449 Chronic obstructive pulmonary disease, unspecified: Secondary | ICD-10-CM

## 2012-09-17 NOTE — Patient Instructions (Addendum)
Please continue your inhaled medications and oxygen We will do prior-authorization forms so you can get ventolin instead of Pro-Air Decrease your prednisone to 2mg  daily now.  If you are doing well in one month, decrease to 1mg  daily and continue this until we follow up. If you lose any ground, then call our office in a month.  Follow with Dr Delton Coombes in 2 months.

## 2012-09-17 NOTE — Progress Notes (Signed)
Subjective:    Patient ID: [name], female    DOB: 1932/12/31, 79 y.o.   MRN: [id number] HPI 80 former smoker, dx with COPD about 1999, also hx breast CA, CVA with R frontal AVM, colonic AVM's. Also diastolic dysfxn followed Dr Gala Romney. Was treated with Spiriva + Advair, since 2003. Now on Symbicort  10/30/10 -- follow up severe COPD. Flare last visit, treated with pred and abx, changed Advair to Symbicort to see if this helped with UA irritation and cough. We left her on Pred 10mg  by mouth once daily with plans to f/u and decide whether to stay on chronically.   02/14/11 Acute OV  Pt presents for a work in visit. Complains of increased SOB w/ exertion and sats into 70s w/ exertion, HR varying from the upper 40s to the 120s. Pt complains that over last couple of weeks she feels tired , low energy and more short of breath with walking. She has marathon helios that she has noticed she desats with walking into the 70s. She uses the demand setting. On the continuous flow she does not desat. Today in the office no desaturation with walking on 3 l/m continuous flow. HR ranges 100-120 with walking and at rest. On her portable oximeter her HR avg 40-120 , in the office HR maintained ~106-120 with walking . EKG w/ no acute changes w/ HR ~90 at rest. Does feel tight in chest with walking. Has some occasional wheezing. No discolored mucus or fever. Ankles have been puffy for last several days.  Recently changed from her bisoprolol to cardizem by cardiology.  ROV 03/05/11 -- severe COPD w hypoxemia. As above, recently seen for DOE and labile HR. Since last time was admitted for anemia, Hgb 6.0. In aftermath feels her breathing and fatigue are improved. Returns today for f/u. She had to have AVM's cauterized. Has been on chronic pred for last 6 months, currently on 5mg  daily. Her goal is to taper to zero. Currently on Symbicort, Spiriva, uses SABA prn not every day.   ROV 04/03/11 -- severe COPD. Last time we  decreased Pred to 3mg  qd. Since our visit she was readmitted for anemia, she is being followed by GI (Dr Mechele Collin). She has had endoscopy, CSY, capsule endoscopy. She feels washed out from the anemia. No real breathing change, wheezing, coughing.   ROV 06/04/11 - written note. Decreased Pred to 1mg  qd.  ROV 07/29/11 -- severe COPD with hypoxemia, tapering steroids. Last time we decreased to 1mg  daily.  She presents with worsening dyspnea, wheeze. No real cough. Her SABA use has increased over the last week. She is less mobile in the home, needed wheelchair.  >>admitted   [facility]  Admitted 11/12-11/15 for AECOPD , tx w/ IV abx , steroids and nebs. CXR without acute process.  Discharged on zpack and steroid taper. She is feeling better with decreased cough and dyspnea  Continued on Spiriva and Symbicort .  Currently on prednisone 10mg  daily  No chest pain , discolored mucus or fever   ROV 09/02/11 -- Severe COPD, has been tapering chronic pred. Hosp for AE in early Nov '12. Prednisone is down to 5mg  now. She has experienced B LE edema over the last month (? Can due to pred).   ROV 10/03/11 -- Severe COPD, has been tapering chronic pred. Last time we decreased pred from 5mg  to 3mg  daily. She tells me that she is still having problems w anemia (has had colonic AVM cauterized before). Hx  of diastolic dysfxn, ? R heart failure. She is planning to see cards today, may need diuretics adjusted.   ROV 12/03/11 -- Severe COPD, diastolic dysfxn, tapering chronic pred currently on 3mg . She is on Zyrtec, has been having some red eyes. Wearing O2 at night and prn. On spiriva + symbicort + SABA, averages once every few weeks.   ROV 01/28/12 -- Severe COPD, diastolic dysfxn, tapering chronic pred, last time decreased to 2mg  qd. She is a bit less active over the last year. Having good days and bad. She uses her O2 prn and at night. She has not flared.   02/24/2012 Acute OV  Complains of productive  cough,sob ,wheezing for 4 days.  Barky cough , worse for last few days Currently on prednisone 2mg   No hemotpysis or increased edema. No fever.   ROV 03/06/12 -- Severe COPD, diastolic dysfxn, tag pering chronic pred, last time decreased to 2mg  qd.  She was recently  Caught URI, was seen and treated as above. Now tapered back down to 2mg  Pred qd.  She continues to have cough and fatigue. The cough is non-prod. She is still having nasal gtt and throat congestion.   Follow up 03/13/12 -  Last visit with COPD flare , tx Levaquin and Prednisone taper  Returns today feeling better. Has few days left of prednisone. Last day of Levaquin.  CXR last ov w/ no acute finding.  Cough and wheezing are decreased but not gone.  Mucus is clear only. No fever.  Currently on prednisone 20mg  daily  No hemoptysis or edema.   ROV 04/23/12 -- Severe COPD, HTN + diastolic dysfxn, hypoxemia. Has been on chronic pred, recent AE as above. Tapered down to Pred 5mg  since another burst 6/28. She presents today with worsening SOB, chest pain especially with exertion or talking. This has been happening for about 1.5 week. She also has more LE edema, more wheezing. She has increased her ProAir use but it hasn't helped very much. Taking protonix qd.    ROV 06/01/12 -- Severe COPD, HTN + diastolic dysfxn, hypoxemia. Has been on chronic pred. Last time I saw her [facility] related to anemia. She is having exertional CP and dyspnea. Her last Hgb (her report) was 12.2. Seen by Dr Juanda Chance in the hospital.   ROV 08/03/12 -- Severe COPD, HTN + diastolic dysfxn, hypoxemia. Also with CAD and hx symptomatic anemia. Has been on chronic pred > current dose 5mg . Stable breathing (still w DOE). No flares since last hospitalization. Recently started on diuretic by PCP, following wt.   ROV 09/17/12 -- Severe COPD, HTN + diastolic dysfxn, hypoxemia. Also with CAD and hx symptomatic anemia. Last time we decreased pred  from 5mg  to 3mg  qd. No flares. Exercise tolerance is stable - does the step machine every day. Wears her o2 w exertion and w sleep. We have had her pred as low as 1mg  in the past, but have not weaned completely off in over 2 years.    Objective:  Physical Exam Filed Vitals:   09/17/12 1333  BP: 110/58  Pulse: 85  Temp: 97.9 F (36.6 C)   GEN: A/Ox3; pleasant , NAD, elderly female   HEENT:  [place]/AT,  EACs-clear, TMs-wnl, NOSE-clear, THROAT-clear, no lesions, no postnasal drip or exudate noted.   NECK:  Supple w/ fair ROM; no JVD; normal carotid impulses w/o bruits; no thyromegaly or nodules palpated; no lymphadenopathy.  RESP  Few soft basilar insp crackles  B, no wheeze, good air movement  CARD:  RRR, Gr 1/6 SM, trace ankle edema, pulses intact, no cyanosis or clubbing.  Musco: Warm bil, no deformities or joint swelling noted.   Neuro: alert, no focal deficits noted.    Skin: Warm, no lesions or rashes   Assessment & Plan:   COPD Has been tolerating the decrease in prednisone thus far. Will attempt to wean further, goal to zero.   Please continue your inhaled medications and oxygen We will do prior-authorization forms so you can get ventolin instead of Pro-Air Decrease your prednisone to 2mg  daily now.  If you are doing well in one month, decrease to 1mg  daily and continue this until we follow up. If you lose any ground, then call our office in a month.  Follow with Dr Delton Coombes in 2 months.

## 2012-09-17 NOTE — Assessment & Plan Note (Signed)
Has been tolerating the decrease in prednisone thus far. Will attempt to wean further, goal to zero.   Please continue your inhaled medications and oxygen We will do prior-authorization forms so you can get ventolin instead of Pro-Air Decrease your prednisone to 2mg  daily now.  If you are doing well in one month, decrease to 1mg  daily and continue this until we follow up. If you lose any ground, then call our office in a month.  Follow with Dr Delton Coombes in 2 months.

## 2012-09-18 ENCOUNTER — Telehealth: Payer: Self-pay | Admitting: Emergency Medicine

## 2012-09-18 DIAGNOSIS — J449 Chronic obstructive pulmonary disease, unspecified: Secondary | ICD-10-CM

## 2012-09-18 MED ORDER — ALBUTEROL SULFATE HFA 108 (90 BASE) MCG/ACT IN AERS: 2.0000 | INHALATION_SPRAY | Freq: Four times a day (QID) | RESPIRATORY_TRACT | Status: DC | PRN

## 2012-09-18 NOTE — Telephone Encounter (Signed)
Spoke with rep from [facility] about [place] for Ventolin HFA inhaler.  Rep ran medication through and inhaler is now covered for patient (insurance changed per pharmacy rep).  Spoke with patient made her aware, and shes requesting a Rx be sent in now. Rx sent in and nothing further needed at this time.

## 2012-09-20 ENCOUNTER — Encounter: Payer: Self-pay | Admitting: Oncology

## 2012-09-20 NOTE — Progress Notes (Signed)
Labs from 09/14/2012 were forwarded to [place] from [facility]. Hemoglobin was 14.4, hematocrit 44.3, white count was 10.2 and platelet count was 257,000.  Ferritin was 334.    She is currently having blood work done every 2 weeks. She has an appointment to see [place] again in late January.

## 2012-09-20 NOTE — Progress Notes (Signed)
Stools were heme negative x 3 on 09/17/12.

## 2012-10-01 ENCOUNTER — Other Ambulatory Visit: Payer: Self-pay

## 2012-10-01 MED ORDER — DILTIAZEM HCL ER COATED BEADS 120 MG PO CP24: 120.0000 mg | ORAL_CAPSULE | Freq: Every day | ORAL | Status: DC

## 2012-10-02 ENCOUNTER — Telehealth: Payer: Self-pay | Admitting: Emergency Medicine

## 2012-10-02 NOTE — Telephone Encounter (Signed)
Called and spoke with pt and she stated that she has a dry cough with some chest tightness.  Pt stated that her prednisone was reduced last week and she is now having some chest tightness.  Pt denied any fever, chills,etc.  Pt is requesting that something be called in soon since her pharmacy closes at 5:30.  RB please advise. Thanks  Allergies  Allergen Reactions  . Latex Hives and Itching

## 2012-10-02 NOTE — Telephone Encounter (Signed)
i spoke with pt and is aware of RB recs. She voiced her understanding. Nothing further was needed

## 2012-10-02 NOTE — Telephone Encounter (Signed)
I suspect she is feeling the decrease in pred. Please have her pred to 5mg  qd for a week, then go back to 3mg  daily.  If this is inadequate, then may need to treat her with a higher dose taper. Please have her call us if not getting better thru the weekend.

## 2012-10-07 ENCOUNTER — Encounter: Payer: Self-pay | Admitting: Oncology

## 2012-10-07 NOTE — Progress Notes (Signed)
Lab data on this patient from the [facility] dated 09/28/2012 was seen by [place] for the first time on 10/06/2012.  CBC is normal with a hemoglobin of 14.7 and hematocrit 46.4. Ferritin was 252.  The patient is post to be having labs done every 2 weeks.  The last set of lab data prior to the 1 above was on 09/04/2012. It looks like we're getting the results 5-7 days after the blood is being drawn.  Patient is suppose to see me in the office in late January.

## 2012-10-12 ENCOUNTER — Telehealth: Payer: Self-pay | Admitting: Oncology

## 2012-10-12 ENCOUNTER — Other Ambulatory Visit: Payer: Medicare Other | Admitting: Lab

## 2012-10-12 ENCOUNTER — Ambulatory Visit: Payer: Medicare Other | Admitting: Physician Assistant

## 2012-10-12 NOTE — Telephone Encounter (Signed)
Pt called and r/s appt from today with ML to MD on 3/3 with labs

## 2012-10-26 ENCOUNTER — Telehealth: Payer: Self-pay | Admitting: Emergency Medicine

## 2012-10-26 DIAGNOSIS — J449 Chronic obstructive pulmonary disease, unspecified: Secondary | ICD-10-CM

## 2012-10-26 MED ORDER — TIOTROPIUM BROMIDE MONOHYDRATE 18 MCG IN CAPS: 18.0000 ug | ORAL_CAPSULE | Freq: Every day | RESPIRATORY_TRACT | Status: DC

## 2012-10-26 NOTE — Telephone Encounter (Signed)
Rx has been sent in, pt is aware. 

## 2012-10-28 ENCOUNTER — Encounter: Payer: Self-pay | Admitting: Oncology

## 2012-10-28 NOTE — Progress Notes (Signed)
We received labs today, 10/28/2012, sent to [place] from Dr. Ethelda Chick. The labs were dated 10/20/2012.  Hemoglobin 14.2, hematocrit 44.3, white count 13.6 with normal differential and platelets 390,000.  Ferritin was 295.  The patient has an appointment to see me on 11/16/2012.

## 2012-11-11 ENCOUNTER — Telehealth: Payer: Self-pay

## 2012-11-11 ENCOUNTER — Encounter: Payer: Self-pay | Admitting: Oncology

## 2012-11-11 NOTE — Progress Notes (Signed)
We received labs from the [facility]. The labs were dated 11/02/2012.  Hemoglobin was 14.1, hematocrit 42.8 and ferritin 190.

## 2012-11-11 NOTE — Telephone Encounter (Signed)
Received labs dated 11/02/12 forwarded to Dr Arline Asp

## 2012-11-12 ENCOUNTER — Encounter: Payer: Self-pay | Admitting: Emergency Medicine

## 2012-11-12 ENCOUNTER — Ambulatory Visit (INDEPENDENT_AMBULATORY_CARE_PROVIDER_SITE_OTHER): Payer: Medicare Other | Admitting: Emergency Medicine

## 2012-11-12 ENCOUNTER — Telehealth: Payer: Self-pay | Admitting: Emergency Medicine

## 2012-11-12 VITALS — BP 110/70 | HR 88 | Temp 97.6°F | Ht 66.5 in | Wt 167.4 lb

## 2012-11-12 DIAGNOSIS — J449 Chronic obstructive pulmonary disease, unspecified: Secondary | ICD-10-CM

## 2012-11-12 NOTE — Telephone Encounter (Signed)
Awaiting Fax for Prior Auth on patient for Ventolin from [employer]. Fax will be received within 25m-24hrs.

## 2012-11-12 NOTE — Assessment & Plan Note (Signed)
She would like to continue the pred on 2mg  for now, go down to 1mg  in March, try zero in April. She will call me if she has trouble Continue the spiriva and symbicort Follow with Dr Delton Coombes in 3 months or sooner if you have any problems.

## 2012-11-12 NOTE — Patient Instructions (Addendum)
Wean prednisone to 1mg  in March Come off prednisone in April Follow UP in 3 months

## 2012-11-12 NOTE — Progress Notes (Signed)
Subjective:    Patient ID: [name], female    DOB: December 14, 1932, 79 y.o.   MRN: [id number] HPI 49 former smoker, dx with COPD about 1999, also hx breast CA, CVA with R frontal AVM, colonic AVM's. Also diastolic dysfxn followed Dr Gala Romney. Was treated with Spiriva + Advair, since 2003. Now on Symbicort  10/30/10 -- follow up severe COPD. Flare last visit, treated with pred and abx, changed Advair to Symbicort to see if this helped with UA irritation and cough. We left her on Pred 10mg  by mouth once daily with plans to f/u and decide whether to stay on chronically.   02/14/11 Acute OV  Pt presents for a work in visit. Complains of increased SOB w/ exertion and sats into 70s w/ exertion, HR varying from the upper 40s to the 120s. Pt complains that over last couple of weeks she feels tired , low energy and more short of breath with walking. She has marathon helios that she has noticed she desats with walking into the 70s. She uses the demand setting. On the continuous flow she does not desat. Today in the office no desaturation with walking on 3 l/m continuous flow. HR ranges 100-120 with walking and at rest. On her portable oximeter her HR avg 40-120 , in the office HR maintained ~106-120 with walking . EKG w/ no acute changes w/ HR ~90 at rest. Does feel tight in chest with walking. Has some occasional wheezing. No discolored mucus or fever. Ankles have been puffy for last several days.  Recently changed from her bisoprolol to cardizem by cardiology.  ROV 03/05/11 -- severe COPD w hypoxemia. As above, recently seen for DOE and labile HR. Since last time was admitted for anemia, Hgb 6.0. In aftermath feels her breathing and fatigue are improved. Returns today for f/u. She had to have AVM's cauterized. Has been on chronic pred for last 6 months, currently on 5mg  daily. Her goal is to taper to zero. Currently on Symbicort, Spiriva, uses SABA prn not every day.   ROV 04/03/11 -- severe COPD. Last time we  decreased Pred to 3mg  qd. Since our visit she was readmitted for anemia, she is being followed by GI (Dr Mechele Collin). She has had endoscopy, CSY, capsule endoscopy. She feels washed out from the anemia. No real breathing change, wheezing, coughing.   ROV 06/04/11 - written note. Decreased Pred to 1mg  qd.  ROV 07/29/11 -- severe COPD with hypoxemia, tapering steroids. Last time we decreased to 1mg  daily.  She presents with worsening dyspnea, wheeze. No real cough. Her SABA use has increased over the last week. She is less mobile in the home, needed wheelchair.  >>admitted   08/12/2011 Post [facility]  Admitted 11/12-11/15 for AECOPD , tx w/ IV abx , steroids and nebs. CXR without acute process.  Discharged on zpack and steroid taper. She is feeling better with decreased cough and dyspnea  Continued on Spiriva and Symbicort .  Currently on prednisone 10mg  daily  No chest pain , discolored mucus or fever   ROV 09/02/11 -- Severe COPD, has been tapering chronic pred. Hosp for AE in early Nov '12. Prednisone is down to 5mg  now. She has experienced B LE edema over the last month (? Can due to pred).   ROV 10/03/11 -- Severe COPD, has been tapering chronic pred. Last time we decreased pred from 5mg  to 3mg  daily. She tells me that she is still having problems w anemia (has had colonic AVM cauterized before). Hx  of diastolic dysfxn, ? R heart failure. She is planning to see cards today, may need diuretics adjusted.   ROV 12/03/11 -- Severe COPD, diastolic dysfxn, tapering chronic pred currently on 3mg . She is on Zyrtec, has been having some red eyes. Wearing O2 at night and prn. On spiriva + symbicort + SABA, averages once every few weeks.   ROV 01/28/12 -- Severe COPD, diastolic dysfxn, tapering chronic pred, last time decreased to 2mg  qd. She is a bit less active over the last year. Having good days and bad. She uses her O2 prn and at night. She has not flared.   02/24/2012 Acute OV  Complains of productive  cough,sob ,wheezing for 4 days.  Barky cough , worse for last few days Currently on prednisone 2mg   No hemotpysis or increased edema. No fever.   ROV 03/06/12 -- Severe COPD, diastolic dysfxn, tag pering chronic pred, last time decreased to 2mg  qd.  She was recently  Caught URI, was seen and treated as above. Now tapered back down to 2mg  Pred qd.  She continues to have cough and fatigue. The cough is non-prod. She is still having nasal gtt and throat congestion.   Follow up 03/13/12 -  Last visit with COPD flare , tx Levaquin and Prednisone taper  Returns today feeling better. Has few days left of prednisone. Last day of Levaquin.  CXR last ov w/ no acute finding.  Cough and wheezing are decreased but not gone.  Mucus is clear only. No fever.  Currently on prednisone 20mg  daily  No hemoptysis or edema.   ROV 04/23/12 -- Severe COPD, HTN + diastolic dysfxn, hypoxemia. Has been on chronic pred, recent AE as above. Tapered down to Pred 5mg  since another burst 6/28. She presents today with worsening SOB, chest pain especially with exertion or talking. This has been happening for about 1.5 week. She also has more LE edema, more wheezing. She has increased her ProAir use but it hasn't helped very much. Taking protonix qd.    ROV 06/01/12 -- Severe COPD, HTN + diastolic dysfxn, hypoxemia. Has been on chronic pred. Last time I saw her [facility] related to anemia. She is having exertional CP and dyspnea. Her last Hgb (her report) was 12.2. Seen by Dr Juanda Chance in the hospital.   ROV 08/03/12 -- Severe COPD, HTN + diastolic dysfxn, hypoxemia. Also with CAD and hx symptomatic anemia. Has been on chronic pred > current dose 5mg . Stable breathing (still w DOE). No flares since last hospitalization. Recently started on diuretic by PCP, following wt.   ROV 09/17/12 -- Severe COPD, HTN + diastolic dysfxn, hypoxemia. Also with CAD and hx symptomatic anemia. Last time we decreased pred  from 5mg  to 3mg  qd. No flares. Exercise tolerance is stable - does the step machine every day. Wears her o2 w exertion and w sleep. We have had her pred as low as 1mg  in the past, but have not weaned completely off in over 2 years.   ROV 11/12/12 -- Severe COPD, HTN + diastolic dysfxn, hypoxemia. Also with CAD and hx symptomatic anemia. We have been weaning pred - had to go back up in January when she had some flaring, but now has weaned back down to 2mg  qd. She is having hoarse throat, lots of clearing. On flonase and protonix.     Objective:  Physical Exam Filed Vitals:   11/12/12 1344  BP: 110/70  Pulse: 88  Temp: 97.6 F (  36.4 C)   GEN: A/Ox3; pleasant , NAD, elderly female   HEENT:  [place]/AT,  EACs-clear, TMs-wnl, NOSE-clear, THROAT-clear, no lesions, no postnasal drip or exudate noted.   NECK:  Supple w/ fair ROM; no JVD; normal carotid impulses w/o bruits; no thyromegaly or nodules palpated; no lymphadenopathy.  RESP  Few soft basilar insp crackles B, no wheeze, good air movement  CARD:  RRR, Gr 1/6 SM, trace ankle edema, pulses intact, no cyanosis or clubbing.  Musco: Warm bil, no deformities or joint swelling noted.   Neuro: alert, no focal deficits noted.    Skin: Warm, no lesions or rashes   Assessment & Plan:   COPD She would like to continue the pred on 2mg  for now, go down to 1mg  in March, try zero in April. She will call me if she has trouble Continue the spiriva and symbicort Follow with Dr Delton Coombes in 3 months or sooner if you have any problems.

## 2012-11-16 ENCOUNTER — Telehealth: Payer: Self-pay | Admitting: *Deleted

## 2012-11-16 ENCOUNTER — Other Ambulatory Visit: Payer: Medicare Other | Admitting: Lab

## 2012-11-16 ENCOUNTER — Ambulatory Visit: Payer: Medicare Other | Admitting: Oncology

## 2012-11-16 NOTE — Telephone Encounter (Signed)
Patient called and canceled her appts for today. Patients to reschedule. Message given to desk RN.  JMW

## 2012-11-18 NOTE — Telephone Encounter (Signed)
Prior Auth fax has been received, working on completing this and will have RB sign and fax it back.

## 2012-11-23 ENCOUNTER — Ambulatory Visit (HOSPITAL_BASED_OUTPATIENT_CLINIC_OR_DEPARTMENT_OTHER): Payer: Medicare Other | Admitting: Oncology

## 2012-11-23 ENCOUNTER — Telehealth: Payer: Self-pay | Admitting: Oncology

## 2012-11-23 ENCOUNTER — Ambulatory Visit (HOSPITAL_BASED_OUTPATIENT_CLINIC_OR_DEPARTMENT_OTHER): Payer: Medicare Other | Admitting: Lab

## 2012-11-23 ENCOUNTER — Encounter: Payer: Self-pay | Admitting: Oncology

## 2012-11-23 VITALS — BP 127/69 | HR 87 | Temp 96.8°F | Resp 18 | Ht 66.5 in | Wt 167.4 lb

## 2012-11-23 LAB — CBC WITH DIFFERENTIAL/PLATELET
BASO%: 0.9 % (ref 0.0–2.0)
Basophils Absolute: 0.1 10*3/uL (ref 0.0–0.1)
EOS%: 2.3 % (ref 0.0–7.0)
HCT: 39.8 % (ref 34.8–46.6)
HGB: 13.4 g/dL (ref 11.6–15.9)
MCH: 30.7 pg (ref 25.1–34.0)
MCHC: 33.6 g/dL (ref 31.5–36.0)
MCV: 91.5 fL (ref 79.5–101.0)
MONO%: 8 % (ref 0.0–14.0)
NEUT%: 63.4 % (ref 38.4–76.8)

## 2012-11-23 LAB — COMPREHENSIVE METABOLIC PANEL (CC13)
Albumin: 3.9 g/dL (ref 3.5–5.0)
BUN: 15.7 mg/dL (ref 7.0–26.0)
CO2: 33 mEq/L — ABNORMAL HIGH (ref 22–29)
Calcium: 9 mg/dL (ref 8.4–10.4)
Chloride: 99 mEq/L (ref 98–107)
Glucose: 105 mg/dl — ABNORMAL HIGH (ref 70–99)
Potassium: 4 mEq/L (ref 3.5–5.1)
Sodium: 142 mEq/L (ref 136–145)
Total Protein: 7 g/dL (ref 6.4–8.3)

## 2012-11-23 LAB — LACTATE DEHYDROGENASE (CC13): LDH: 188 U/L (ref 125–245)

## 2012-11-23 NOTE — Telephone Encounter (Signed)
Gave pt appt for lab and MD on June 2014  °

## 2012-11-23 NOTE — Progress Notes (Signed)
This office note has been dictated.  #[id number]

## 2012-11-24 ENCOUNTER — Encounter: Payer: Self-pay | Admitting: Medical Oncology

## 2012-11-24 ENCOUNTER — Other Ambulatory Visit: Payer: Self-pay | Admitting: Oncology

## 2012-11-24 NOTE — Telephone Encounter (Signed)
Prior [name] has been completed and faxed to [facility] @ [phone]

## 2012-11-24 NOTE — Progress Notes (Signed)
CC:   Bevelyn Buckles. Bensimhon, MD Leslye Peer, MD Hedwig Morton. Juanda Chance, MD Ethelda Chick, MD, Fax [phone]  PROBLEM LIST:  1. Anemia secondary to iron deficiency and gastrointestinal bleeding  from colonic arteriovenous malformations. The patient received IV INFeD 1025 mg on 04/24/2012 when she was hospitalized.  She has also received Feraheme 1020 mg IV on 07/17/2012 and 08/21/2012.  2. Chronic obstructive pulmonary disease (COPD) diagnosed in 1999,  currently on home oxygen at 3 L/minute at rest and during sleep.  The patient uses 4 L per minute with exertion. She has been on  home oxygen for approximately the past 5 years.  3. Pulmonary hypertension.  4. Right frontal arteriovenous malformation (AVM).  5. Coronary artery disease.  6. Diastolic dysfunction.  7. Past history of multiple strokes.  8. History of left carotid stenosis with left carotid endarterectomy  in 2006 at [facility].  9. Hypertension.  10. Dyslipidemia.  11. Ductal carcinoma in situ involving the right breast, status post  lumpectomy, radiation, and 5 years of tamoxifen.  12. Peripheral vascular disease.  13. Hiatal hernia.  14. Status post thyroidectomy in 2009 or 2010.  15. Osteoporosis.  16. Stroke involving right eye in April 2011, now with vision restored.  17. Diverticulosis.  18. Hearing impairment.  19. Systolic ejection murmur.  20. Hoarseness noted around December 2013.  This tends to be more noticeable toward the end of the day.  I suggested the patient have an ENT evaluation.  MEDICATIONS:  Reviewed and recorded. Current Outpatient Prescriptions  Medication Sig Dispense Refill  . albuterol (PROVENTIL HFA;VENTOLIN HFA) 108 (90 BASE) MCG/ACT inhaler Inhale 2 puffs into the lungs every 6 (six) hours as needed.  1 Inhaler  11  . albuterol (PROVENTIL) (2.5 MG/3ML) 0.083% nebulizer solution Take 3 mLs (2.5 mg total) by nebulization every 6 (six) hours as needed for wheezing.  75 mL  12  . aspirin  81 MG EC tablet Take 81 mg by mouth daily.        Marland Kitchen atorvastatin (LIPITOR) 20 MG tablet Take 20 mg by mouth daily.        . budesonide-formoterol (SYMBICORT) 160-4.5 MCG/ACT inhaler Inhale 2 puffs into the lungs 2 (two) times daily.  1 Inhaler  11  . Calcium Carbonate (CALCIUM 600) 1500 MG TABS Take 1 tablet by mouth daily.        . Cholecalciferol (VITAMIN D-3 PO) Take 1,000 Units by mouth daily.       Marland Kitchen diltiazem (CARDIZEM CD) 120 MG 24 hr capsule Take 1 capsule (120 mg total) by mouth daily.  30 capsule  3  . fluticasone (FLONASE) 50 MCG/ACT nasal spray Place 2 sprays into the nose daily.      . furosemide (LASIX) 20 MG tablet Take 20 mg by mouth daily.        Marland Kitchen HYDROcodone-acetaminophen (VICODIN) 5-500 MG per tablet Take 1 tablet by mouth every 4 (four) hours as needed. For pain      . levothyroxine (SYNTHROID, LEVOTHROID) 112 MCG tablet Take 112 mcg by mouth daily.       Marland Kitchen levothyroxine (SYNTHROID, LEVOTHROID) 50 MCG tablet Take 50 mcg by mouth daily.        . Multiple Vitamins-Minerals (ONE-A-DAY EXTRAS ANTIOXIDANT) CAPS Take 1 capsule by mouth daily.        . pantoprazole (PROTONIX) 40 MG tablet Take 1 tablet (40 mg total) by mouth 2 (two) times daily.  60 tablet  5  . predniSONE (  DELTASONE) 1 MG tablet 2 mg. Take as directed      . tiotropium (SPIRIVA) 18 MCG inhalation capsule Place 1 capsule (18 mcg total) into inhaler and inhale daily.  30 capsule  5  . tolterodine (DETROL LA) 2 MG 24 hr capsule Take 2 mg by mouth daily.       No current facility-administered medications for this visit.    SMOKING HISTORY:  The patient smoked 1 pack of cigarettes a day for 30 years.  She stopped smoking in the late 1990s.   HISTORY:  [name] was seen today for followup of her history of iron deficiency anemia associated with GI bleeding felt to be due to colonic arteriovenous malformations.  The patient also has diverticulosis.  She was accompanied by her niece, [name].  The  patient was last seen by [place] on 08/18/2012.  At that time the patient's hemoglobin was 12.4 with normal red cell indices.  Ferritin was 35, iron saturation 14. We arranged for the patient to receive IV Feraheme 1020 mg on 08/21/2013.  Her previous IV iron had been administered on 07/17/2012, also 1020 mg of IV Feraheme.  Stools were submitted on 09/17/2012 and were negative x3.  Since then we have been monitoring CBC and ferritin fairly closely, about every 2 weeks.  Labs have been drawn at the [facility] and forwarded to [place].  We usually receive them about a week after they are drawn.  On 09/14/2012 ferritin was 334 with a hemoglobin of 14.4.  The hemoglobin has been stable.  On 09/28/2012 ferritin was 252, on 10/20/2012 ferritin was 295, and on 11/02/2012 ferritin was 190.  The patient denies any obvious blood in her stools or any melena.  She has been feeling well.  No pagophagia.  She has noticed some hoarseness over the past 2-3 months, usually worse as the day goes on  The patient does have a hiatal hernia and has some reflux symptoms.  She is supposed to be on Protonix 40 mg twice daily.  This was started in August.  The patient also has been on over-the-counter antihistamines, Zyrtec.  I have suggested that the patient may want to see an ENT specialist for evaluation.  A 2 view chest x-ray was carried out on 03/06/2012 and showed no obvious mediastinal abnormality.  The patient has COPD and osteopenia as well as some slight cardiomegaly.  PHYSICAL EXAMINATION:  The patient shows no obvious changes.  She is now 79 years old, having celebrated her birthday on [birth year].  Weight 167 pounds 6.4 ounces.  Weight is stable.  Height 5 feet 6-1/2 inches.  Body surface area 1.89 sq m.  Blood pressure 127/69.  Other vital signs are normal.  No scleral icterus.  Mouth and pharynx are benign.  No peripheral adenopathy palpable.  Lungs reveal some bibasilar  inspiratory rales.  Voice is not hoarse at this time.  Cardiac exam regular rhythm with systolic ejection murmur.  Breasts are not examined.  No axillary adenopathy.  Abdomen is benign with no organomegaly or masses palpable. Extremities, no peripheral edema.  Neurologic exam is normal.  The patient does not have a Port-A-Cath or central catheter.  LABORATORY DATA:  White count 10.0, ANC 6.4, hemoglobin 13.4, hematocrit 39.8, platelets 210,000.  Chemistries are essentially normal.  BUN 16, creatinine 0.7, albumin 3.9, LDH 188.  Iron studies are pending.  On 08/18/2012 ferritin was 35, iron saturation 14%.  On 07/14/2012 ferritin was 8 and iron  saturation 8%, and on 04/25/2012 ferritin was 11.  IMAGING STUDIES:  1. Chest x-ray, 2 view, from 03/06/2012 showed slight cardiac  silhouette enlargement. There was overall hyperinflation  consistent with COPD. No pulmonary edema, pneumonia, or pleural  effusion. There was a large cyst or bulla, stable at the right  upper lobe. There were stable midthoracic vertebral body  compression fractures, and the bones appeared osteopenic.  2. Myocardial imaging with SPECT gated left ventricular wall motion  study carried out on 04/27/2012 showed a small area of  reversibility involving the anterior apical wall. Findings raise  concern for a small area of pharmacologically-induced ischemia.  Calculated ejection fraction was 40% with mild hypokinesia.   IMPRESSION AND PLAN: Ms. Esteve seems to be doing fairly well at the present time.  We have been following her CBC and ferritin every 2 weeks with the help of Dr. Ethelda Chick.  Of note is the fact that the ferritin has dropped from 334 down to 190 from 09/14/2012 down to 11/02/2012.  We are awaiting iron studies today.  It will be recalled that the patient did receive IV Feraheme 1020 mg on 08/21/2012.  The patient has done fairly well.  I suspect her ferritin level will continue to drift downward  and that at some point she will need more IV iron.  In the past I do not believe that she was able to keep up with her GI bleeding with oral iron.  If it appears that she is not bleeding very much we may be able to put her back on oral iron although this will probably make her stools dark and may obscure any more obvious or overt GI bleeding.  It seems that for the last few months the patient has not been bleeding very much.  It will be recalled that stools were checked on 09/17/2012 and were negative x3.  We will plan to have the patient's CBC and ferritin checked every 3 weeks.  We will be faxing this order to Dr. Su Hilt.  The patient will receive IV iron as indicated.  We will plan to see Ms. Detjen again in 3 months, at which time we will check CBC, chemistries and iron studies.  I have suggested that she see an ENT specialist for evaluation of her hoarseness which seems to be more prominent as the day wears on.   Addendum: Ferritin came back at 114, as compared with 190 on 11/02/2012 and 295 on 10/20/2012.  We will arrange for the patient to have IV Feraheme 1020 mg within the next week or so.   ______________________________ Samul Dada, M.D. DSM/MEDQ  D:  11/23/2012  T:  11/24/2012  Job:  [id number]

## 2012-11-24 NOTE — Progress Notes (Signed)
Orders to draw a CBC with diff and ferritin every 3 weeks with start date of 12/14/12 faxed to [facility]. Fax [phone].

## 2012-11-25 ENCOUNTER — Telehealth: Payer: Self-pay | Admitting: Medical Oncology

## 2012-11-25 ENCOUNTER — Telehealth: Payer: Self-pay | Admitting: Emergency Medicine

## 2012-11-25 NOTE — Telephone Encounter (Signed)
Member ID# : [id number]  Received APPROVAL for Ventolin HFA from [facility] [employer] Covered From 08/26/2012 through 09/15/13  Paper work placed in RB scan folder to be scanned in.

## 2012-11-25 NOTE — Telephone Encounter (Signed)
I called pt to inform her that Dr.Murinson is going to schedule her for IV feraheme. She is aware the schedulers will call her for a date and time. She voiced understanding. She asked that her appt not be early in the am.

## 2012-11-29 ENCOUNTER — Telehealth: Payer: Self-pay | Admitting: Oncology

## 2012-11-29 NOTE — Telephone Encounter (Signed)
S/w pt re appt for 3/24. Date per pt.

## 2012-12-02 ENCOUNTER — Ambulatory Visit: Payer: Medicare Other

## 2012-12-07 ENCOUNTER — Ambulatory Visit (HOSPITAL_BASED_OUTPATIENT_CLINIC_OR_DEPARTMENT_OTHER): Payer: Medicare Other

## 2012-12-07 VITALS — BP 118/67 | HR 96 | Temp 97.7°F | Resp 17

## 2012-12-07 DIAGNOSIS — D649 Anemia, unspecified: Secondary | ICD-10-CM

## 2012-12-07 DIAGNOSIS — D509 Iron deficiency anemia, unspecified: Secondary | ICD-10-CM

## 2012-12-07 DIAGNOSIS — K5521 Angiodysplasia of colon with hemorrhage: Secondary | ICD-10-CM

## 2012-12-07 MED ORDER — FERUMOXYTOL INJECTION 510 MG/17 ML
1020.0000 mg | Freq: Once | INTRAVENOUS | Status: AC
  Administered 2012-12-07: 1020 mg via INTRAVENOUS
  Filled 2012-12-07: qty 34

## 2012-12-07 MED ORDER — SODIUM CHLORIDE 0.9 % IV SOLN
Freq: Once | INTRAVENOUS | Status: AC
  Administered 2012-12-07: 15:00:00 via INTRAVENOUS

## 2012-12-07 NOTE — Patient Instructions (Signed)
Ferumoxytol injection What is this medicine? FERUMOXYTOL is an iron complex. Iron is used to make healthy red blood cells, which carry oxygen and nutrients throughout the body. This medicine is used to treat iron deficiency anemia in people with chronic kidney disease. This medicine may be used for other purposes; ask your health care provider or pharmacist if you have questions. What should I tell my health care provider before I take this medicine? They need to know if you have any of these conditions: -anemia not caused by low iron levels -high levels of iron in the blood -magnetic resonance imaging (MRI) test scheduled -an unusual or allergic reaction to iron, other medicines, foods, dyes, or preservatives -pregnant or trying to get pregnant -breast-feeding How should I use this medicine? This medicine is for infusion into a vein. It is given by a health care professional in a hospital or clinic setting. Talk to your pediatrician regarding the use of this medicine in children. Special care may be needed. Overdosage: If you think you've taken too much of this medicine contact a poison control center or emergency room at once. Overdosage: If you think you have taken too much of this medicine contact a poison control center or emergency room at once. NOTE: This medicine is only for you. Do not share this medicine with others. What if I miss a dose? It is important not to miss your dose. Call your doctor or health care professional if you are unable to keep an appointment. What may interact with this medicine? This medicine may interact with the following medications: -other iron products This list may not describe all possible interactions. Give your health care provider a list of all the medicines, herbs, non-prescription drugs, or dietary supplements you use. Also tell them if you smoke, drink alcohol, or use illegal drugs. Some items may interact with your medicine. What should I watch  for while using this medicine? Visit your doctor or healthcare professional regularly. Tell your doctor or healthcare professional if your symptoms do not start to get better or if they get worse. You may need blood work done while you are taking this medicine. You may need to follow a special diet. Talk to your doctor. Foods that contain iron include: whole grains/cereals, dried fruits, beans, or peas, leafy green vegetables, and organ meats (liver, kidney). What side effects may I notice from receiving this medicine? Side effects that you should report to your doctor or health care professional as soon as possible: -allergic reactions like skin rash, itching or hives, swelling of the face, lips, or tongue -breathing problems -changes in blood pressure -feeling faint or lightheaded, falls -fever or chills -flushing, sweating, or hot feelings -swelling of the ankles or feet Side effects that usually do not require medical attention (Report these to your doctor or health care professional if they continue or are bothersome.): -diarrhea -headache -nausea, vomiting -stomach pain This list may not describe all possible side effects. Call your doctor for medical advice about side effects. You may report side effects to FDA at 1-800-FDA-1088. Where should I keep my medicine? This drug is given in a hospital or clinic and will not be stored at home. NOTE: This sheet is a summary. It may not cover all possible information. If you have questions about this medicine, talk to your doctor, pharmacist, or health care provider.  2012, Elsevier/Gold Standard. (05/25/2008 9:48:25 PM) 

## 2012-12-21 ENCOUNTER — Encounter: Payer: Self-pay | Admitting: Oncology

## 2012-12-21 NOTE — Progress Notes (Signed)
We received labs from [facility] dated 12/14/2012:  Ferritin was 1103.  Hemoglobin 14.9 Hematocrit 45.7    The patient received IV Feraheme 1020 mg on 12/07/2012.

## 2012-12-23 ENCOUNTER — Encounter (INDEPENDENT_AMBULATORY_CARE_PROVIDER_SITE_OTHER): Payer: Medicare Other

## 2012-12-23 DIAGNOSIS — R42 Dizziness and giddiness: Secondary | ICD-10-CM

## 2012-12-23 DIAGNOSIS — I6529 Occlusion and stenosis of unspecified carotid artery: Secondary | ICD-10-CM

## 2012-12-29 ENCOUNTER — Other Ambulatory Visit: Payer: Self-pay | Admitting: Emergency Medicine

## 2013-01-05 NOTE — Progress Notes (Signed)
Lab data was forwarded to [place] from [facility] dated 01/04/2013:    Ferritin 486  Hemoglobin 14.3 Hematocrit 42.7   The patient received IV Feraheme 1020 mg on 12/07/2012.  Ferritin on 12/14/2012 was 1103.   CBC and ferritin are being checked every 3 weeks.  The patient's next appointment to see me should be in mid June.

## 2013-01-06 ENCOUNTER — Other Ambulatory Visit: Payer: Self-pay | Admitting: Emergency Medicine

## 2013-01-06 NOTE — Telephone Encounter (Signed)
Fax received Symbicort has already been filled

## 2013-01-13 ENCOUNTER — Encounter: Payer: Self-pay | Admitting: Oncology

## 2013-01-13 NOTE — Progress Notes (Signed)
We received labs from [facility] dated 01/04/2013:   Ferritin was 486 as compared with 1103 on 12/14/2012. Hemoglobin 14.3. Hematocrit 42.7.   The patient received IV Feraheme 1020 mg on 12/07/2012.

## 2013-02-11 ENCOUNTER — Ambulatory Visit: Payer: Medicare Other | Admitting: Emergency Medicine

## 2013-02-17 ENCOUNTER — Encounter: Payer: Self-pay | Admitting: Oncology

## 2013-02-17 NOTE — Progress Notes (Signed)
We received lab report from the [facility].  Ferritin was 344 Hemoglobin 14.6 Hematocrit 44.0   The patient received IV Feraheme 1020 mg on 12/07/2012.  I believe she is supposed to be having blood work done every 3 weeks. Previously we received lab work carried out on 01/04/2013   She has an appointment to see me on 03/08/2013.

## 2013-03-01 ENCOUNTER — Encounter: Payer: Self-pay | Admitting: Oncology

## 2013-03-04 ENCOUNTER — Encounter: Payer: Self-pay | Admitting: Emergency Medicine

## 2013-03-04 ENCOUNTER — Ambulatory Visit (INDEPENDENT_AMBULATORY_CARE_PROVIDER_SITE_OTHER): Payer: Medicare Other | Admitting: Emergency Medicine

## 2013-03-04 VITALS — BP 112/68 | HR 79 | Temp 97.6°F | Ht 66.5 in | Wt 167.4 lb

## 2013-03-04 DIAGNOSIS — J449 Chronic obstructive pulmonary disease, unspecified: Secondary | ICD-10-CM

## 2013-03-04 NOTE — Progress Notes (Signed)
Subjective:    Patient ID: [name], female    DOB: 1933/08/31, 79 y.o.   MRN: [id number] HPI 62 former smoker, dx with COPD about 1999, also hx breast CA, CVA with R frontal AVM, colonic AVM's. Also diastolic dysfxn followed Dr Gala Romney. Was treated with Spiriva + Advair, since 2003. Now on Symbicort  10/30/10 -- follow up severe COPD. Flare last visit, treated with pred and abx, changed Advair to Symbicort to see if this helped with UA irritation and cough. We left her on Pred 10mg  by mouth once daily with plans to f/u and decide whether to stay on chronically.   02/14/11 Acute OV  Pt presents for a work in visit. Complains of increased SOB w/ exertion and sats into 70s w/ exertion, HR varying from the upper 40s to the 120s. Pt complains that over last couple of weeks she feels tired , low energy and more short of breath with walking. She has marathon helios that she has noticed she desats with walking into the 70s. She uses the demand setting. On the continuous flow she does not desat. Today in the office no desaturation with walking on 3 l/m continuous flow. HR ranges 100-120 with walking and at rest. On her portable oximeter her HR avg 40-120 , in the office HR maintained ~106-120 with walking . EKG w/ no acute changes w/ HR ~90 at rest. Does feel tight in chest with walking. Has some occasional wheezing. No discolored mucus or fever. Ankles have been puffy for last several days.  Recently changed from her bisoprolol to cardizem by cardiology.  ROV 03/05/11 -- severe COPD w hypoxemia. As above, recently seen for DOE and labile HR. Since last time was admitted for anemia, Hgb 6.0. In aftermath feels her breathing and fatigue are improved. Returns today for f/u. She had to have AVM's cauterized. Has been on chronic pred for last 6 months, currently on 5mg  daily. Her goal is to taper to zero. Currently on Symbicort, Spiriva, uses SABA prn not every day.   ROV 04/03/11 -- severe COPD. Last time we  decreased Pred to 3mg  qd. Since our visit she was readmitted for anemia, she is being followed by GI (Dr Mechele Collin). She has had endoscopy, CSY, capsule endoscopy. She feels washed out from the anemia. No real breathing change, wheezing, coughing.   ROV 06/04/11 - written note. Decreased Pred to 1mg  qd.  ROV 07/29/11 -- severe COPD with hypoxemia, tapering steroids. Last time we decreased to 1mg  daily.  She presents with worsening dyspnea, wheeze. No real cough. Her SABA use has increased over the last week. She is less mobile in the home, needed wheelchair.  >>admitted   [facility]  Admitted 11/12-11/15 for AECOPD , tx w/ IV abx , steroids and nebs. CXR without acute process.  Discharged on zpack and steroid taper. She is feeling better with decreased cough and dyspnea  Continued on Spiriva and Symbicort .  Currently on prednisone 10mg  daily  No chest pain , discolored mucus or fever   ROV 09/02/11 -- Severe COPD, has been tapering chronic pred. Hosp for AE in early Nov '12. Prednisone is down to 5mg  now. She has experienced B LE edema over the last month (? Can due to pred).   ROV 10/03/11 -- Severe COPD, has been tapering chronic pred. Last time we decreased pred from 5mg  to 3mg  daily. She tells me that she is still having problems w anemia (has had colonic AVM cauterized before). Hx  of diastolic dysfxn, ? R heart failure. She is planning to see cards today, may need diuretics adjusted.   ROV 12/03/11 -- Severe COPD, diastolic dysfxn, tapering chronic pred currently on 3mg . She is on Zyrtec, has been having some red eyes. Wearing O2 at night and prn. On spiriva + symbicort + SABA, averages once every few weeks.   ROV 01/28/12 -- Severe COPD, diastolic dysfxn, tapering chronic pred, last time decreased to 2mg  qd. She is a bit less active over the last year. Having good days and bad. She uses her O2 prn and at night. She has not flared.   02/24/2012 Acute OV  Complains of productive  cough,sob ,wheezing for 4 days.  Barky cough , worse for last few days Currently on prednisone 2mg   No hemotpysis or increased edema. No fever.   ROV 03/06/12 -- Severe COPD, diastolic dysfxn, tag pering chronic pred, last time decreased to 2mg  qd.  She was recently  Caught URI, was seen and treated as above. Now tapered back down to 2mg  Pred qd.  She continues to have cough and fatigue. The cough is non-prod. She is still having nasal gtt and throat congestion.   Follow up 03/13/12 -  Last visit with COPD flare , tx Levaquin and Prednisone taper  Returns today feeling better. Has few days left of prednisone. Last day of Levaquin.  CXR last ov w/ no acute finding.  Cough and wheezing are decreased but not gone.  Mucus is clear only. No fever.  Currently on prednisone 20mg  daily  No hemoptysis or edema.   ROV 04/23/12 -- Severe COPD, HTN + diastolic dysfxn, hypoxemia. Has been on chronic pred, recent AE as above. Tapered down to Pred 5mg  since another burst 6/28. She presents today with worsening SOB, chest pain especially with exertion or talking. This has been happening for about 1.5 week. She also has more LE edema, more wheezing. She has increased her ProAir use but it hasn't helped very much. Taking protonix qd.    ROV 06/01/12 -- Severe COPD, HTN + diastolic dysfxn, hypoxemia. Has been on chronic pred. Last time I saw her [facility] was [facility] related to anemia. She is having exertional CP and dyspnea. Her last Hgb (her report) was 12.2. Seen by Dr Olevia Perches in the hospital.   ROV 08/03/12 -- Severe COPD, HTN + diastolic dysfxn, hypoxemia. Also with CAD and hx symptomatic anemia. Has been on chronic pred > current dose 5mg . Stable breathing (still w DOE). No flares since last hospitalization. Recently started on diuretic by PCP, following wt.   ROV 09/17/12 -- Severe COPD, HTN + diastolic dysfxn, hypoxemia. Also with CAD and hx symptomatic anemia. Last time we decreased pred  from 5mg  to 3mg  qd. No flares. Exercise tolerance is stable - does the step machine every day. Wears her o2 w exertion and w sleep. We have had her pred as low as 1mg  in the past, but have not weaned completely off in over 2 years.   ROV 11/12/12 -- Severe COPD, HTN + diastolic dysfxn, hypoxemia. Also with CAD and hx symptomatic anemia. We have been weaning pred - had to go back up in January when she had some flaring, but now has weaned back down to 2mg  qd. She is having hoarse throat, lots of clearing. On flonase and protonix.   ROV 03/04/13 -- Severe COPD, HTN + diastolic dysfxn, hypoxemia. Also with CAD and hx symptomatic anemia. We have weaned pred down  to 1mg  daily. Her breathing is at baseline. Spiriva + symbicort, rare albuterol. She has some fatigue, low appetite. She has an episode of L lower CP 6/18. She thought is was GERD.    Objective:  Physical Exam Filed Vitals:   03/04/13 1445  BP: 112/68  Pulse: 79  Temp: 97.6 F (36.4 C)   GEN: A/Ox3; pleasant , NAD, elderly female   HEENT:  [place]/AT,  EACs-clear, TMs-wnl, NOSE-clear, THROAT-clear, no lesions, no postnasal drip or exudate noted.   NECK:  Supple w/ fair ROM; no JVD; normal carotid impulses w/o bruits; no thyromegaly or nodules palpated; no lymphadenopathy.  RESP  Few soft basilar insp crackles B, no wheeze, good air movement  CARD:  RRR, Gr 1/6 SM, trace ankle edema, pulses intact, no cyanosis or clubbing.  Musco: Warm bil, no deformities or joint swelling noted.   Neuro: alert, no focal deficits noted.    Skin: Warm, no lesions or rashes   Assessment & Plan:   COPD Stable at this time.  - decrease pred to zero and follow for signs adrenal insufficiency; reviewed the sx with her today - same BD's - rov 1 months

## 2013-03-04 NOTE — Patient Instructions (Addendum)
Continue your inhaled medications as you are taking them Wear your oxygen with exertion Stop your prednisone. Watch for signs of adrenal insufficiency as we discussed > fatigue, low energy, sleepiness, dizziness, fainting spells, or any other concerning symptoms.  Follow with Dr Delton Coombes in 1 month

## 2013-03-04 NOTE — Assessment & Plan Note (Signed)
Stable at this time.  - decrease pred to zero and follow for signs adrenal insufficiency; reviewed the sx with her today - same BD's - rov 1 months

## 2013-03-08 ENCOUNTER — Other Ambulatory Visit (HOSPITAL_BASED_OUTPATIENT_CLINIC_OR_DEPARTMENT_OTHER): Payer: Medicare Other | Admitting: Lab

## 2013-03-08 ENCOUNTER — Encounter: Payer: Self-pay | Admitting: Oncology

## 2013-03-08 ENCOUNTER — Telehealth: Payer: Self-pay | Admitting: Emergency Medicine

## 2013-03-08 ENCOUNTER — Telehealth: Payer: Self-pay | Admitting: Oncology

## 2013-03-08 ENCOUNTER — Ambulatory Visit (HOSPITAL_BASED_OUTPATIENT_CLINIC_OR_DEPARTMENT_OTHER): Payer: Medicare Other | Admitting: Oncology

## 2013-03-08 VITALS — BP 124/71 | HR 93 | Temp 97.3°F | Resp 20 | Ht 66.5 in | Wt 165.4 lb

## 2013-03-08 DIAGNOSIS — D509 Iron deficiency anemia, unspecified: Secondary | ICD-10-CM

## 2013-03-08 DIAGNOSIS — J449 Chronic obstructive pulmonary disease, unspecified: Secondary | ICD-10-CM

## 2013-03-08 DIAGNOSIS — D649 Anemia, unspecified: Secondary | ICD-10-CM

## 2013-03-08 LAB — COMPREHENSIVE METABOLIC PANEL (CC13)
AST: 18 U/L (ref 5–34)
BUN: 16.6 mg/dL (ref 7.0–26.0)
Calcium: 9.2 mg/dL (ref 8.4–10.4)
Chloride: 101 mEq/L (ref 98–107)
Creatinine: 0.7 mg/dL (ref 0.6–1.1)

## 2013-03-08 LAB — IRON AND TIBC
%SAT: 28 % (ref 20–55)
TIBC: 268 ug/dL (ref 250–470)
UIBC: 192 ug/dL (ref 125–400)

## 2013-03-08 LAB — CBC WITH DIFFERENTIAL/PLATELET
BASO%: 0.9 % (ref 0.0–2.0)
Eosinophils Absolute: 0.3 10*3/uL (ref 0.0–0.5)
HCT: 42.2 % (ref 34.8–46.6)
LYMPH%: 20.8 % (ref 14.0–49.7)
MCHC: 33.8 g/dL (ref 31.5–36.0)
MONO#: 0.8 10*3/uL (ref 0.1–0.9)
NEUT#: 6.7 10*3/uL — ABNORMAL HIGH (ref 1.5–6.5)
Platelets: 229 10*3/uL (ref 145–400)
RBC: 4.55 10*6/uL (ref 3.70–5.45)
WBC: 10 10*3/uL (ref 3.9–10.3)
lymph#: 2.1 10*3/uL (ref 0.9–3.3)

## 2013-03-08 LAB — FERRITIN: Ferritin: 252 ng/mL (ref 10–291)

## 2013-03-08 MED ORDER — ALBUTEROL SULFATE HFA 108 (90 BASE) MCG/ACT IN AERS: 2.0000 | INHALATION_SPRAY | Freq: Four times a day (QID) | RESPIRATORY_TRACT | Status: DC | PRN

## 2013-03-08 MED ORDER — BUDESONIDE-FORMOTEROL FUMARATE 160-4.5 MCG/ACT IN AERO: INHALATION_SPRAY | RESPIRATORY_TRACT | Status: DC

## 2013-03-08 MED ORDER — TIOTROPIUM BROMIDE MONOHYDRATE 18 MCG IN CAPS: 18.0000 ug | ORAL_CAPSULE | Freq: Every day | RESPIRATORY_TRACT | Status: DC

## 2013-03-08 NOTE — Progress Notes (Signed)
This office note has been dictated.  #[id number]

## 2013-03-08 NOTE — Telephone Encounter (Signed)
Spoke with pt requesting  Albuterol,symbicort and spiriva  rx sent pt has appt 7/30

## 2013-03-09 NOTE — Progress Notes (Signed)
CC:   Bevelyn Buckles. Bensimhon, MD Leslye Peer, MD Ethelda Chick, MD, Fax [phone] Hedwig Morton. Juanda Chance, MD   PROBLEM LIST:  1. Anemia secondary to iron deficiency and gastrointestinal bleeding  from colonic arteriovenous malformations. The patient received IV INFeD 1025 mg on 04/24/2012 when she was hospitalized. She has also received Feraheme 1020 mg IV on 07/17/2012, 08/21/2012 and 12/07/2012.  2. Chronic obstructive pulmonary disease (COPD) diagnosed in 1999,  currently on home oxygen at 3 L/minute at rest and during sleep.  The patient uses 4 L per minute with exertion. She has been on  home oxygen for approximately the past 5 years.  3. Pulmonary hypertension.  4. Right frontal arteriovenous malformation (AVM).  5. Coronary artery disease.  6. Diastolic dysfunction.  7. Past history of multiple strokes.  8. History of left carotid stenosis with left carotid endarterectomy  in 2006 at [facility].  9. Hypertension.  10. Dyslipidemia.  11. Ductal carcinoma in situ involving the right breast, status post  lumpectomy, radiation, and 5 years of tamoxifen.  12. Peripheral vascular disease.  13. Hiatal hernia.  14. Status post thyroidectomy in 2009 or 2010.  15. Osteoporosis.  16. Stroke involving right eye in April 2011, now with vision restored.  17. Diverticulosis.  18. Hearing impairment.  19. Systolic ejection murmur.  20. Hoarseness noted around December 2013. This was evaluated by an ENT specialist.    MEDICATIONS:  Reviewed and recorded. Current Outpatient Prescriptions  Medication Sig Dispense Refill  . albuterol (PROVENTIL HFA;VENTOLIN HFA) 108 (90 BASE) MCG/ACT inhaler Inhale 2 puffs into the lungs every 6 (six) hours as needed.  1 Inhaler  11  . aspirin 81 MG EC tablet Take 81 mg by mouth daily.        Marland Kitchen atorvastatin (LIPITOR) 20 MG tablet Take 20 mg by mouth daily.        . budesonide-formoterol (SYMBICORT) 160-4.5 MCG/ACT inhaler INHALE 2 PUFFS BY MOUTH TWICE  DAILY  10.2 g  6  . Calcium Carbonate (CALCIUM 600) 1500 MG TABS Take 1 tablet by mouth daily.        . Cholecalciferol (VITAMIN D-3 PO) Take 1,000 Units by mouth daily.       Marland Kitchen diltiazem (CARDIZEM CD) 120 MG 24 hr capsule Take 1 capsule (120 mg total) by mouth daily.  30 capsule  3  . fluticasone (FLONASE) 50 MCG/ACT nasal spray Place 2 sprays into the nose daily.      . furosemide (LASIX) 20 MG tablet Take 20 mg by mouth daily.        Marland Kitchen HYDROcodone-acetaminophen (VICODIN) 5-500 MG per tablet Take 1 tablet by mouth every 4 (four) hours as needed. For pain      . levothyroxine (SYNTHROID, LEVOTHROID) 50 MCG tablet Take 150 mcg by mouth daily.       . Multiple Vitamins-Minerals (ONE-A-DAY EXTRAS ANTIOXIDANT) CAPS Take 1 capsule by mouth daily.        . pantoprazole (PROTONIX) 40 MG tablet Take 1 tablet (40 mg total) by mouth 2 (two) times daily.  60 tablet  5  . tiotropium (SPIRIVA) 18 MCG inhalation capsule Place 1 capsule (18 mcg total) into inhaler and inhale daily.  30 capsule  5  . tolterodine (DETROL LA) 2 MG 24 hr capsule Take 2 mg by mouth daily.      Marland Kitchen albuterol (PROVENTIL) (2.5 MG/3ML) 0.083% nebulizer solution Take 3 mLs (2.5 mg total) by nebulization every 6 (six) hours as needed for  wheezing.  75 mL  12   No current facility-administered medications for this visit.     TREATMENT PROGRAM:   The patient receives IV Feraheme as needed.  She has received IV Feraheme 1020 mg on 07/17/2012, 08/21/2012 and 12/07/2012. Currently we are checking CBC and ferritin every month.  SMOKING HISTORY:  The patient smoked 1 pack of cigarettes a day for 30 years.  She stopped smoking in the late 1990's.   HISTORY:  I saw [name] today for follow-up of her iron-deficiency anemia associated with GI bleeding, felt to be due to colonic AV malformations.  The patient also has diverticulosis.  She is accompanied today by her niece, [name].  The patient was last seen by [place] on 11/23/2012.   On 12/07/2012, the patient came in for IV Feraheme 1020 mg. She tolerates her infusions well.  She denies pagophagia or any evidence of blood in her stools.  She denies black stools.  Stools were negative x3 on 09/17/2012.  The patient is without any complaints today.  She seems to be doing fairly well since her last visit here 3 months ago.  PHYSICAL EXAMINATION:  General:  The patient is 79 years old.  Vital Signs:  Weight 165 pounds 6.4 ounces, height 5 feet 6-1/2 inches,  body surface area 1.88 sq m, blood pressure 124/71; other vital signs were normal.  HEENT:  There was no scleral icterus.  Mouth and pharynx were benign.  Lymphs:  There was no peripheral adenopathy palpable.  Lungs: Revealed some intermittent inspiratory rales at the bases, right greater than left.  Cardiac:  Regular rhythm with systolic ejection murmur. Breasts:  The patient has been treated for intraductal carcinoma of the right breast.  The right breast is slightly smaller than the left breast.  No suspicious findings.  Abdomen:  Benign, with no organomegaly or masses palpable.  Extremities:  No peripheral edema or clubbing. Neurologic:  Exam was normal.  The patient's voice was not hoarse.  The patient does not have a Port-A-Cath or central catheter.  LABORATORY DATA:  Today:  White count 10.0, ANC 6.7, hemoglobin 14.2, hematocrit 42.2, platelets 229,000.  Red cell indices were normal. Chemistries today were essentially normal.  Albumin 3.8, LDH 181, BUN 17, creatinine 0.7.  Iron studies today are pending.  On 11/23/2012, iron saturation was 22% and ferritin was 114.  On 02/17/2013, outside laboratories revealed:  Ferritin 344, hemoglobin 14.6, hematocrit 44.0.  IMAGING STUDIES:  1. Chest x-ray, 2 view, from 03/06/2012 showed slight cardiac  silhouette enlargement. There was overall hyperinflation  consistent with COPD. No pulmonary edema, pneumonia, or pleural  effusion. There was a large cyst or bulla,  stable at the right  upper lobe. There were stable midthoracic vertebral body  compression fractures, and the bones appeared osteopenic.  2. Myocardial imaging with SPECT gated left ventricular wall motion  study carried out on 04/27/2012 showed a small area of  reversibility involving the anterior apical wall. Findings raise  concern for a small area of pharmacologically-induced ischemia.  Calculated ejection fraction was 40% with mild hypokinesia.   IMPRESSION AND PLAN:  Ms. Betters continues to do well on the current program.  We will increase interval of the lab tests from every 3 weeks, to now every 4 weeks.  The order will be faxed to Dr. Su Hilt.  The patient was given a copy of the prescription for lab tests which are being forwarded to [place] in a very consistent and timely manner.  Lately, the patient has been requiring IV Feraheme about every 3 months. Thus, according to that time table I would suspect that the patient may need some IV Feraheme within the next couple of months since her last infusion of IV Feraheme 1020 mg occurred on 12/07/2012.  I have asked Ms. Corro to return in 4 months, at which time we will check CBC, chemistries, iron TIBC and ferritin.  The patient did see an ENT doctor in [place] and underwent fiberoptic bronchoscopy in his office.  She was told that she was using her false vocal cords rather than her true vocal cords and that she might need some speech therapy.    ______________________________ Samul Dada, M.D. DSM/MEDQ  D:  03/08/2013  T:  03/08/2013  Job:  [id number]

## 2013-03-12 ENCOUNTER — Other Ambulatory Visit: Payer: Self-pay

## 2013-03-12 MED ORDER — DILTIAZEM HCL ER COATED BEADS 120 MG PO CP24: 120.0000 mg | ORAL_CAPSULE | Freq: Every day | ORAL | Status: DC

## 2013-03-30 ENCOUNTER — Ambulatory Visit: Payer: Self-pay | Admitting: Family Medicine

## 2013-04-14 ENCOUNTER — Ambulatory Visit (INDEPENDENT_AMBULATORY_CARE_PROVIDER_SITE_OTHER): Payer: Medicare Other | Admitting: Emergency Medicine

## 2013-04-14 ENCOUNTER — Encounter: Payer: Self-pay | Admitting: Emergency Medicine

## 2013-04-14 VITALS — BP 110/70 | HR 89 | Temp 98.1°F | Ht 66.5 in | Wt 170.6 lb

## 2013-04-14 DIAGNOSIS — J449 Chronic obstructive pulmonary disease, unspecified: Secondary | ICD-10-CM

## 2013-04-14 MED ORDER — ALBUTEROL SULFATE HFA 108 (90 BASE) MCG/ACT IN AERS: 2.0000 | INHALATION_SPRAY | Freq: Four times a day (QID) | RESPIRATORY_TRACT | Status: DC | PRN

## 2013-04-14 NOTE — Progress Notes (Signed)
Subjective:    Patient ID: Nielle Duford, female    DOB: [birth year], 79 y.o.   MRN: [id number] HPI 103 former smoker, dx with COPD about 1999, also hx breast CA, CVA with R frontal AVM, colonic AVM's. Also diastolic dysfxn followed Dr Gala Romney. Was treated with Spiriva + Advair, since 2003. Now on Symbicort  10/30/10 -- follow up severe COPD. Flare last visit, treated with pred and abx, changed Advair to Symbicort to see if this helped with UA irritation and cough. We left her on Pred 10mg  by mouth once daily with plans to f/u and decide whether to stay on chronically.   02/14/11 Acute OV  Pt presents for a work in visit. Complains of increased SOB w/ exertion and sats into 70s w/ exertion, HR varying from the upper 40s to the 120s. Pt complains that over last couple of weeks she feels tired , low energy and more short of breath with walking. She has marathon helios that she has noticed she desats with walking into the 70s. She uses the demand setting. On the continuous flow she does not desat. Today in the office no desaturation with walking on 3 l/m continuous flow. HR ranges 100-120 with walking and at rest. On her portable oximeter her HR avg 40-120 , in the office HR maintained ~106-120 with walking . EKG w/ no acute changes w/ HR ~90 at rest. Does feel tight in chest with walking. Has some occasional wheezing. No discolored mucus or fever. Ankles have been puffy for last several days.  Recently changed from her bisoprolol to cardizem by cardiology.  ROV 03/05/11 -- severe COPD w hypoxemia. As above, recently seen for DOE and labile HR. Since last time was admitted for anemia, Hgb 6.0. In aftermath feels her breathing and fatigue are improved. Returns today for f/u. She had to have AVM's cauterized. Has been on chronic pred for last 6 months, currently on 5mg  daily. Her goal is to taper to zero. Currently on Symbicort, Spiriva, uses SABA prn not every day.   ROV 04/03/11 -- severe COPD. Last time we  decreased Pred to 3mg  qd. Since our visit she was readmitted for anemia, she is being followed by GI (Dr Mechele Collin). She has had endoscopy, CSY, capsule endoscopy. She feels washed out from the anemia. No real breathing change, wheezing, coughing.   ROV 06/04/11 - written note. Decreased Pred to 1mg  qd.  ROV 07/29/11 -- severe COPD with hypoxemia, tapering steroids. Last time we decreased to 1mg  daily.  She presents with worsening dyspnea, wheeze. No real cough. Her SABA use has increased over the last week. She is less mobile in the home, needed wheelchair.  >>admitted   [facility]  Admitted 11/12-11/15 for AECOPD , tx w/ IV abx , steroids and nebs. CXR without acute process.  Discharged on zpack and steroid taper. She is feeling better with decreased cough and dyspnea  Continued on Spiriva and Symbicort .  Currently on prednisone 10mg  daily  No chest pain , discolored mucus or fever   ROV 09/02/11 -- Severe COPD, has been tapering chronic pred. Hosp for AE in early Nov '12. Prednisone is down to 5mg  now. She has experienced B LE edema over the last month (? Can due to pred).   ROV 10/03/11 -- Severe COPD, has been tapering chronic pred. Last time we decreased pred from 5mg  to 3mg  daily. She tells me that she is still having problems w anemia (has had colonic AVM cauterized before). Hx  of diastolic dysfxn, ? R heart failure. She is planning to see cards today, may need diuretics adjusted.   ROV 12/03/11 -- Severe COPD, diastolic dysfxn, tapering chronic pred currently on 3mg . She is on Zyrtec, has been having some red eyes. Wearing O2 at night and prn. On spiriva + symbicort + SABA, averages once every few weeks.   ROV 01/28/12 -- Severe COPD, diastolic dysfxn, tapering chronic pred, last time decreased to 2mg  qd. She is a bit less active over the last year. Having good days and bad. She uses her O2 prn and at night. She has not flared.   02/24/2012 Acute OV  Complains of productive  cough,sob ,wheezing for 4 days.  Barky cough , worse for last few days Currently on prednisone 2mg   No hemotpysis or increased edema. No fever.   ROV 03/06/12 -- Severe COPD, diastolic dysfxn, tag pering chronic pred, last time decreased to 2mg  qd.  She was recently  Caught URI, was seen and treated as above. Now tapered back down to 2mg  Pred qd.  She continues to have cough and fatigue. The cough is non-prod. She is still having nasal gtt and throat congestion.   Follow up 03/13/12 -  Last visit with COPD flare , tx Levaquin and Prednisone taper  Returns today feeling better. Has few days left of prednisone. Last day of Levaquin.  CXR last ov w/ no acute finding.  Cough and wheezing are decreased but not gone.  Mucus is clear only. No fever.  Currently on prednisone 20mg  daily  No hemoptysis or edema.   ROV 04/23/12 -- Severe COPD, HTN + diastolic dysfxn, hypoxemia. Has been on chronic pred, recent AE as above. Tapered down to Pred 5mg  since another burst 6/28. She presents today with worsening SOB, chest pain especially with exertion or talking. This has been happening for about 1.5 week. She also has more LE edema, more wheezing. She has increased her ProAir use but it hasn't helped very much. Taking protonix qd.    ROV 06/01/12 -- Severe COPD, HTN + diastolic dysfxn, hypoxemia. Has been on chronic pred. Last time I saw her she was [facility] for [facility] related to anemia. She is having exertional CP and dyspnea. Her last Hgb (her report) was 12.2. Seen by Dr Olevia Perches in the hospital.   ROV 08/03/12 -- Severe COPD, HTN + diastolic dysfxn, hypoxemia. Also with CAD and hx symptomatic anemia. Has been on chronic pred > current dose 5mg . Stable breathing (still w DOE). No flares since last hospitalization. Recently started on diuretic by PCP, following wt.   ROV 09/17/12 -- Severe COPD, HTN + diastolic dysfxn, hypoxemia. Also with CAD and hx symptomatic anemia. Last time we decreased pred  from 5mg  to 3mg  qd. No flares. Exercise tolerance is stable - does the step machine every day. Wears her o2 w exertion and w sleep. We have had her pred as low as 1mg  in the past, but have not weaned completely off in over 2 years.   ROV 11/12/12 -- Severe COPD, HTN + diastolic dysfxn, hypoxemia. Also with CAD and hx symptomatic anemia. We have been weaning pred - had to go back up in January when she had some flaring, but now has weaned back down to 2mg  qd. She is having hoarse throat, lots of clearing. On flonase and protonix.   ROV 03/04/13 -- Severe COPD, HTN + diastolic dysfxn, hypoxemia. Also with CAD and hx symptomatic anemia. We have weaned pred down  to 1mg  daily. Her breathing is at baseline. Spiriva + symbicort, rare albuterol. She has some fatigue, low appetite. She has an episode of L lower CP 6/18. She thought is was GERD.   ROV 04/14/13 -- Severe COPD, HTN + diastolic dysfxn, hypoxemia. Also with CAD and hx symptomatic anemia (with dyspnea). Last visit we weaned her prednisone to off, this has been done slowly over several months. Remains on Symbicort, Spiriva. Has been taking Symbicort qhs instead of BID. She has done well off the pred x 1 month, but for the last 3 days more DOE and wheeze, resolved by albuterol. No cough or URI sx, no PND.     Objective:  Physical Exam Filed Vitals:   04/14/13 1347  BP: 110/70  Pulse: 89  Temp: 98.1 F (36.7 C)  TempSrc: Oral  Height: 5' 6.5" (1.689 m)  Weight: 170 lb 9.6 oz (77.384 kg)  SpO2: 91%   GEN: A/Ox3; pleasant , NAD, elderly female   HEENT:  [place]/AT,  EACs-clear, TMs-wnl, NOSE-clear, THROAT-clear, no lesions, no postnasal drip or exudate noted.   NECK:  Supple w/ fair ROM; no JVD; normal carotid impulses w/o bruits; no thyromegaly or nodules palpated; no lymphadenopathy.  RESP  Few soft basilar insp crackles B, B exp wheezes L > R  CARD:  RRR, Gr 1/6 SM, trace ankle edema, pulses intact, no cyanosis or clubbing.  Musco: Warm bil,  no deformities or joint swelling noted.   Neuro: alert, no focal deficits noted.    Skin: Warm, no lesions or rashes   Assessment & Plan:   COPD Some of her wheeze is UA, some lower - will defer steroids for now, try increasing her symbicort to bid - she will call next week to let me know if she is wheezing, if so reconsider prednisone - rov 1 month

## 2013-04-14 NOTE — Patient Instructions (Addendum)
Continue your Spiriva Start taking your Symbicort 2 puffs twice a day Use your albuterol 2 puffs as needed for shortness of breath Call our office next week if you are still having wheeze Follow with Dr Delton Coombes in 1 month

## 2013-04-14 NOTE — Assessment & Plan Note (Signed)
Some of her wheeze is UA, some lower - will defer steroids for now, try increasing her symbicort to bid - she will call next week to let me know if she is wheezing, if so reconsider prednisone - rov 1 month

## 2013-05-14 ENCOUNTER — Ambulatory Visit (INDEPENDENT_AMBULATORY_CARE_PROVIDER_SITE_OTHER): Payer: Medicare Other | Admitting: Emergency Medicine

## 2013-05-14 ENCOUNTER — Encounter: Payer: Self-pay | Admitting: Emergency Medicine

## 2013-05-14 ENCOUNTER — Telehealth: Payer: Self-pay | Admitting: Oncology

## 2013-05-14 ENCOUNTER — Telehealth: Payer: Self-pay | Admitting: Hematology and Oncology

## 2013-05-14 VITALS — BP 112/74 | HR 82 | Ht 67.0 in | Wt 169.4 lb

## 2013-05-14 DIAGNOSIS — J4489 Other specified chronic obstructive pulmonary disease: Secondary | ICD-10-CM

## 2013-05-14 DIAGNOSIS — J449 Chronic obstructive pulmonary disease, unspecified: Secondary | ICD-10-CM

## 2013-05-14 NOTE — Telephone Encounter (Signed)
Pt called a 2nd time and r/s 10/27 appt to 10/30. Pt has new appt d/t.

## 2013-05-14 NOTE — Patient Instructions (Addendum)
Please continue your inhaled medications as you are taking them  Wear your oxygen with all exertion and while sleeping We will not restart prednisone at this time.  Get the Flu Shot in the fall Follow with Dr Delton Coombes in 4 months or sooner if you have any problems.

## 2013-05-14 NOTE — Assessment & Plan Note (Signed)
Tolerated d/c pred. Changed symbicort to bid Continue symbicort + spiriva O2 as ordered  No pred for now Flu shot in fall rov 4

## 2013-05-14 NOTE — Telephone Encounter (Signed)
Pt called today to move 10/23 appt to 10/27. Pt has new d/t.

## 2013-05-14 NOTE — Progress Notes (Signed)
Subjective:    Patient ID: [name], female    DOB: [birth year], 79 y.o.   MRN: [id number] HPI 50 former smoker, dx with COPD about 1999, also hx breast CA, CVA with R frontal AVM, colonic AVM's. Also diastolic dysfxn followed Dr Gala Romney. Was treated with Spiriva + Advair, since 2003. Now on Symbicort  10/30/10 -- follow up severe COPD. Flare last visit, treated with pred and abx, changed Advair to Symbicort to see if this helped with UA irritation and cough. We left her on Pred 10mg  by mouth once daily with plans to f/u and decide whether to stay on chronically.   02/14/11 Acute OV  Pt presents for a work in visit. Complains of increased SOB w/ exertion and sats into 70s w/ exertion, HR varying from the upper 40s to the 120s. Pt complains that over last couple of weeks she feels tired , low energy and more short of breath with walking. She has marathon helios that she has noticed she desats with walking into the 70s. She uses the demand setting. On the continuous flow she does not desat. Today in the office no desaturation with walking on 3 l/m continuous flow. HR ranges 100-120 with walking and at rest. On her portable oximeter her HR avg 40-120 , in the office HR maintained ~106-120 with walking . EKG w/ no acute changes w/ HR ~90 at rest. Does feel tight in chest with walking. Has some occasional wheezing. No discolored mucus or fever. Ankles have been puffy for last several days.  Recently changed from her bisoprolol to cardizem by cardiology.  ROV 03/05/11 -- severe COPD w hypoxemia. As above, recently seen for DOE and labile HR. Since last time was admitted for anemia, Hgb 6.0. In aftermath feels her breathing and fatigue are improved. Returns today for f/u. She had to have AVM's cauterized. Has been on chronic pred for last 6 months, currently on 5mg  daily. Her goal is to taper to zero. Currently on Symbicort, Spiriva, uses SABA prn not every day.   ROV 04/03/11 -- severe COPD. Last time we  decreased Pred to 3mg  qd. Since our visit she was readmitted for anemia, she is being followed by GI (Dr Mechele Collin). She has had endoscopy, CSY, capsule endoscopy. She feels washed out from the anemia. No real breathing change, wheezing, coughing.   ROV 06/04/11 - written note. Decreased Pred to 1mg  qd.  ROV 07/29/11 -- severe COPD with hypoxemia, tapering steroids. Last time we decreased to 1mg  daily.  She presents with worsening dyspnea, wheeze. No real cough. Her SABA use has increased over the last week. She is less mobile in the home, needed wheelchair.  >>admitted   [facility]/12/[facility]  Admitted 11/12-11/15 for AECOPD , tx w/ IV abx , steroids and nebs. CXR without acute process.  Discharged on zpack and steroid taper. She is feeling better with decreased cough and dyspnea  Continued on Spiriva and Symbicort .  Currently on prednisone 10mg  daily  No chest pain , discolored mucus or fever   ROV 09/02/11 -- Severe COPD, has been tapering chronic pred. Hosp for AE in early Nov '12. Prednisone is down to 5mg  now. She has experienced B LE edema over the last month (? Can due to pred).   ROV 10/03/11 -- Severe COPD, has been tapering chronic pred. Last time we decreased pred from 5mg  to 3mg  daily. She tells me that she is still having problems w anemia (has had colonic AVM cauterized before). Hx  of diastolic dysfxn, ? R heart failure. She is planning to see cards today, may need diuretics adjusted.   ROV 12/03/11 -- Severe COPD, diastolic dysfxn, tapering chronic pred currently on 3mg . She is on Zyrtec, has been having some red eyes. Wearing O2 at night and prn. On spiriva + symbicort + SABA, averages once every few weeks.   ROV 01/28/12 -- Severe COPD, diastolic dysfxn, tapering chronic pred, last time decreased to 2mg  qd. She is a bit less active over the last year. Having good days and bad. She uses her O2 prn and at night. She has not flared.   02/24/2012 Acute OV  Complains of productive  cough,sob ,wheezing for 4 days.  Barky cough , worse for last few days Currently on prednisone 2mg   No hemotpysis or increased edema. No fever.   ROV 03/06/12 -- Severe COPD, diastolic dysfxn, tag pering chronic pred, last time decreased to 2mg  qd.  She was recently  Caught URI, was seen and treated as above. Now tapered back down to 2mg  Pred qd.  She continues to have cough and fatigue. The cough is non-prod. She is still having nasal gtt and throat congestion.   Follow up 03/13/12 -  Last visit with COPD flare , tx Levaquin and Prednisone taper  Returns today feeling better. Has few days left of prednisone. Last day of Levaquin.  CXR last ov w/ no acute finding.  Cough and wheezing are decreased but not gone.  Mucus is clear only. No fever.  Currently on prednisone 20mg  daily  No hemoptysis or edema.   ROV 04/23/12 -- Severe COPD, HTN + diastolic dysfxn, hypoxemia. Has been on chronic pred, recent AE as above. Tapered down to Pred 5mg  since another burst 6/28. She presents today with worsening SOB, chest pain especially with exertion or talking. This has been happening for about 1.5 week. She also has more LE edema, more wheezing. She has increased her ProAir use but it hasn't helped very much. Taking protonix qd.    ROV 06/01/12 -- Severe COPD, HTN + diastolic dysfxn, hypoxemia. Has been on chronic pred. Last time I saw her she was [facility] for [facility]ina related to anemia. She is having exertional CP and dyspnea. Her last Hgb (her report) was 12.2. Seen by Dr Olevia Perches in the hospital.   ROV 08/03/12 -- Severe COPD, HTN + diastolic dysfxn, hypoxemia. Also with CAD and hx symptomatic anemia. Has been on chronic pred > current dose 5mg . Stable breathing (still w DOE). No flares since last hospitalization. Recently started on diuretic by PCP, following wt.   ROV 09/17/12 -- Severe COPD, HTN + diastolic dysfxn, hypoxemia. Also with CAD and hx symptomatic anemia. Last time we decreased pred  from 5mg  to 3mg  qd. No flares. Exercise tolerance is stable - does the step machine every day. Wears her o2 w exertion and w sleep. We have had her pred as low as 1mg  in the past, but have not weaned completely off in over 2 years.   ROV 11/12/12 -- Severe COPD, HTN + diastolic dysfxn, hypoxemia. Also with CAD and hx symptomatic anemia. We have been weaning pred - had to go back up in January when she had some flaring, but now has weaned back down to 2mg  qd. She is having hoarse throat, lots of clearing. On flonase and protonix.   ROV 03/04/13 -- Severe COPD, HTN + diastolic dysfxn, hypoxemia. Also with CAD and hx symptomatic anemia. We have weaned pred down  to 1mg  daily. Her breathing is at baseline. Spiriva + symbicort, rare albuterol. She has some fatigue, low appetite. She has an episode of L lower CP 6/18. She thought is was GERD.   ROV 04/14/13 -- Severe COPD, HTN + diastolic dysfxn, hypoxemia. Also with CAD and hx symptomatic anemia (with dyspnea). Last visit we weaned her prednisone to off, this has been done slowly over several months. Remains on Symbicort, Spiriva. Has been taking Symbicort qhs instead of BID. She has done well off the pred x 1 month, but for the last 3 days more DOE and wheeze, resolved by albuterol. No cough or URI sx, no PND.    ROV 05/14/13 -- Severe COPD, HTN + diastolic dysfxn, hypoxemia, symptomatic anemia. Last time we increased her symbicort to bid. Her breathing is doing well. She is able to exert some, but limited. She is not wheezing.    Objective:  Physical Exam Filed Vitals:   05/14/13 1350  BP: 112/74  Pulse: 82  Height: 5\' 7"  (1.702 m)  Weight: 169 lb 6.4 oz (76.839 kg)  SpO2: 94%   GEN: A/Ox3; pleasant , NAD, elderly female   HEENT:  [place]/AT,  EACs-clear, TMs-wnl, NOSE-clear, THROAT-clear, no lesions, no postnasal drip or exudate noted.   NECK:  Supple w/ fair ROM; no JVD; normal carotid impulses w/o bruits; no thyromegaly or nodules palpated; no  lymphadenopathy.  RESP  Few soft basilar insp crackles B, B exp wheezes L > R  CARD:  RRR, Gr 1/6 SM, trace ankle edema, pulses intact, no cyanosis or clubbing.  Musco: Warm bil, no deformities or joint swelling noted.   Neuro: alert, no focal deficits noted.    Skin: Warm, no lesions or rashes   Assessment & Plan:   COPD Tolerated d/c pred. Changed symbicort to bid Continue symbicort + spiriva O2 as ordered  No pred for now Flu shot in fall rov 4

## 2013-05-26 ENCOUNTER — Telehealth: Payer: Self-pay | Admitting: Internal Medicine

## 2013-05-26 NOTE — Telephone Encounter (Signed)
pt called to r/s appt to earlier time...ok pt ok

## 2013-06-21 ENCOUNTER — Telehealth: Payer: Self-pay | Admitting: Internal Medicine

## 2013-06-21 NOTE — Telephone Encounter (Signed)
Pt called again and r/s appt to 11/4 lab and MD appt has been r/s 3x and mentioned this to pt and MD does not like it that appt is bering r/s everytime

## 2013-06-25 ENCOUNTER — Ambulatory Visit: Payer: Self-pay | Admitting: Internal Medicine

## 2013-06-25 LAB — URINALYSIS, COMPLETE
Glucose,UR: NEGATIVE mg/dL (ref 0–75)
Ketone: NEGATIVE
Nitrite: POSITIVE
Protein: 30
Specific Gravity: 1.025 (ref 1.003–1.030)
WBC UR: >30 /HPF (ref 0–5)

## 2013-07-08 ENCOUNTER — Other Ambulatory Visit: Payer: Medicare Other | Admitting: Lab

## 2013-07-08 ENCOUNTER — Ambulatory Visit: Payer: Medicare Other

## 2013-07-12 ENCOUNTER — Ambulatory Visit: Payer: Medicare Other

## 2013-07-12 ENCOUNTER — Other Ambulatory Visit: Payer: Medicare Other | Admitting: Lab

## 2013-07-15 ENCOUNTER — Other Ambulatory Visit: Payer: Medicare Other | Admitting: Lab

## 2013-07-15 ENCOUNTER — Ambulatory Visit: Payer: Medicare Other

## 2013-07-16 ENCOUNTER — Other Ambulatory Visit: Payer: Medicare Other | Admitting: Lab

## 2013-07-16 ENCOUNTER — Ambulatory Visit: Payer: Medicare Other

## 2013-07-20 ENCOUNTER — Other Ambulatory Visit: Payer: Self-pay | Admitting: Internal Medicine

## 2013-07-20 ENCOUNTER — Other Ambulatory Visit (HOSPITAL_BASED_OUTPATIENT_CLINIC_OR_DEPARTMENT_OTHER): Payer: Medicare Other | Admitting: Lab

## 2013-07-20 ENCOUNTER — Ambulatory Visit (HOSPITAL_BASED_OUTPATIENT_CLINIC_OR_DEPARTMENT_OTHER): Payer: Medicare Other | Admitting: Internal Medicine

## 2013-07-20 ENCOUNTER — Telehealth: Payer: Self-pay | Admitting: Internal Medicine

## 2013-07-20 VITALS — BP 146/85 | HR 88 | Temp 97.1°F | Resp 17 | Ht 67.0 in | Wt 167.6 lb

## 2013-07-20 DIAGNOSIS — D649 Anemia, unspecified: Secondary | ICD-10-CM

## 2013-07-20 DIAGNOSIS — D509 Iron deficiency anemia, unspecified: Secondary | ICD-10-CM

## 2013-07-20 DIAGNOSIS — K5521 Angiodysplasia of colon with hemorrhage: Secondary | ICD-10-CM

## 2013-07-20 LAB — CBC WITH DIFFERENTIAL/PLATELET
BASO%: 0.7 % (ref 0.0–2.0)
Eosinophils Absolute: 0.3 10*3/uL (ref 0.0–0.5)
LYMPH%: 23.2 % (ref 14.0–49.7)
MCHC: 32.8 g/dL (ref 31.5–36.0)
MCV: 92.8 fL (ref 79.5–101.0)
MONO#: 0.9 10*3/uL (ref 0.1–0.9)
NEUT#: 5.8 10*3/uL (ref 1.5–6.5)
Platelets: 238 10*3/uL (ref 145–400)
RBC: 4.72 10*6/uL (ref 3.70–5.45)
RDW: 13.1 % (ref 11.2–14.5)
WBC: 9.2 10*3/uL (ref 3.9–10.3)

## 2013-07-20 LAB — COMPREHENSIVE METABOLIC PANEL (CC13)
Albumin: 3.9 g/dL (ref 3.5–5.0)
Anion Gap: 10 mEq/L (ref 3–11)
CO2: 31 mEq/L — ABNORMAL HIGH (ref 22–29)
Calcium: 9.9 mg/dL (ref 8.4–10.4)
Chloride: 104 mEq/L (ref 98–109)
Glucose: 100 mg/dl (ref 70–140)
Potassium: 4.3 mEq/L (ref 3.5–5.1)
Sodium: 145 mEq/L (ref 136–145)
Total Protein: 7.5 g/dL (ref 6.4–8.3)

## 2013-07-20 LAB — FERRITIN CHCC: Ferritin: 116 ng/ml (ref 9–269)

## 2013-07-20 NOTE — Patient Instructions (Signed)
Fall Prevention, Elderly Falls are the leading cause of injuries, accidents, and accidental deaths in people over the age of 2. Falling is a real threat to your ability to live on your own. CAUSES   Poor eyesight or poor hearing can make you more likely to fall.   Illnesses and physical conditions can affect your strength and balance.   Poor lighting, throw rugs and pets in your home can make you more likely to trip or slip.   The side effects of some medicines can upset your balance and lead to falling. These include medicines for depression, sleep problems, high blood pressure, diabetes, and heart conditions.  PREVENTION  Be sure your home is as safe as possible. Here are some tips:  Wear shoes with non-skid soles (not house slippers).   Be sure your home and outside area are well lit.   Use night lights throughout your house, including hallways and stairways.   Remove clutter and clean up spills on floors and walkways.   Remove throw rugs or fasten them to the floor with carpet tape. Tack down carpet edges.   Do not place electrical cords across pathways.   Install grab bars in your bathtub, shower, and toilet area. Towel bars should not be used as a grab bar.   Install handrails on both sides of stairways.   Do not climb on stools or stepladders. Get someone else to help with jobs that require climbing.   Do not wax your floors at all, or use a non-skid wax.   Repair uneven or unsafe sidewalks, walkways or stairs.   Keep frequently used items within reach.   Be aware of pets so you do not trip.  Get regular check-ups from your doctor, and take good care of yourself:  Have your eyes checked every year for vision changes, cataracts, glaucoma, and other eye problems. Wear eyeglasses as directed.   Have your hearing checked every 2 years, or anytime you or others think that you cannot hear well. Use hearing aids as directed.   See your caregiver if you have foot pain or  corns. Sore feet can contribute to falls.   Let your caregiver know if a medicine is making you feel dizzy or making you lose your balance.   Use a cane, walker, or wheelchair as directed. Use walker or wheelchair brakes when getting in and out.   When you get up from bed, sit on the side of the bed for 1 to 2 minutes before you stand up. This will give your blood pressure time to adjust, and you will feel less dizzy.   If you need to go to the bathroom often, consider using a bedside commode.  Keep your body in good shape:  Get regular exercise, especially walking.   Do exercises to strengthen the muscles you use for walking and lifting.   Do not smoke.   Minimize use of alcohol.  SEEK IMMEDIATE MEDICAL CARE IF:   You feel dizzy, weak, or unsteady on your feet.   You feel confused.   You fall.  Document Released: 09/02/2005 Document Revised: 08/22/2011 Document Reviewed: 02/27/2007 [facility] Patient Information 2012 [place], [place].Ferritin This is done to test for anemia. Anemia occurs when the amount of hemoglobin (found in the red blood cells) drops below normal. Hemoglobin is necessary for the transportation of oxygen throughout the body. Blood tests may show a variety of common, treatable abnormalities that can lead to problems associated with anemia. Iron deficiency anemia is  the most common of the anemias. It is usually due to bleeding. In women, iron deficiency may be due to heavy menstrual periods. In older women and in men, the bleeding is usually from disease of the intestines. In children and in pregnant women, the body needs more iron. Iron deficiency may be due to simply not eating enough iron in the diet. Iron deficiency may also result from some extreme diets. Treatment of iron deficiency usually involves iron supplements. In older women and in men, there is usually some further testing needed to determine why the person is iron deficient.  PREPARATION FOR TEST A blood  sample is obtained by inserting a needle into a vein in the arm. NORMAL FINDINGS Female: 12-300ng/ml or 12-300  g/L (SI units) Female:  10-150 ng/ml or 10-150  g/L (SI units) Children/adolescent:  Newborn: 25-200 ng/ml  1 month: 200-600 ng/ml  2-5 months: 50-200 ng/ml  6 months-15 years: 7-142 ng/ml Ranges for normal findings may vary among different laboratories and hospitals. You should always check with your doctor after having lab work or other tests done to discuss the meaning of your test results and whether your values are considered within normal limits. MEANING OF TEST  Your caregiver will go over the test results with you and discuss the importance and meaning of your results, as well as treatment options and the need for additional tests if necessary. OBTAINING THE TEST RESULTS  It is your responsibility to obtain your test results. Ask the lab or department performing the test when and how you will get your results. Document Released: 09/25/2004 Document Revised: 11/25/2011 Document Reviewed: 08/12/2008 [facility] Patient Information 2014 [place], [place]. Iron Deficiency Anemia There are many types of anemia. Iron deficiency anemia is the most common. Iron deficiency anemia is a decrease in the number of red blood cells caused by too little iron. Without enough iron, your body does not produce enough hemoglobin. Hemoglobin is a substance in red blood cells that carries oxygen to the body's tissues. Iron deficiency anemia may leave you tired and short of breath. CAUSES   Lack of iron in the diet.  This may be seen in infants and children, because there is little iron in milk.  This may be seen in adults who do not eat enough iron-rich foods.  This may be seen in pregnant or breastfeeding women who do not take iron supplements. There is a much higher need for iron intake at these times.  Poor absorption of iron, as seen with intestinal disorders.  Intestinal bleeding.  Heavy  periods. SYMPTOMS  Mild anemia may not be noticeable. Symptoms may include:  Fatigue.  Headache.  Pale skin.  Weakness.  Shortness of breath.  Dizziness.  Cold hands and feet.  Fast or irregular heartbeat. DIAGNOSIS  Diagnosis requires a thorough evaluation and physical exam by your caregiver.  Blood tests are generally used to confirm iron deficiency anemia.  Additional tests may be done to find the underlying cause of your anemia. These may include:  Testing for blood in the stool (fecal occult blood test).  A procedure to see inside the colon and rectum (colonoscopy).  A procedure to see inside the esophagus and stomach (endoscopy). TREATMENT   Correcting the cause of the iron deficiency is the first step.  Medicines, such as oral contraceptives, can make heavy menstrual flows lighter.  Antibiotics and other medicines can be used to treat peptic ulcers.  Surgery may be needed to remove a bleeding polyp, tumor, or  fibroid.  Often, iron supplements (ferrous sulfate) are taken.  For the best iron absorption, take these supplements with an empty stomach.  You may need to take the supplements with food if you cannot tolerate them on an empty stomach. Vitamin C improves the absorption of iron. Your caregiver may recommend taking your iron tablets with a glass of orange juice or vitamin C supplement.  Milk and antacids should not be taken at the same time as iron supplements. They may interfere with the absorption of iron.  Iron supplements can cause constipation. A stool softener is often recommended.  Pregnant and breastfeeding women will need to take extra iron, because their normal diet usually will not provide the required amount.  Patients who cannot tolerate iron by mouth can take it through a vein (intravenously) or by an injection into the muscle. HOME CARE INSTRUCTIONS   Ask your dietitian for help with diet questions.  Take iron and vitamins as  directed by your caregiver.  Eat a diet rich in iron. Eat liver, lean beef, whole-grain bread, eggs, dried fruit, and dark green leafy vegetables. SEEK IMMEDIATE MEDICAL CARE IF:   You have a fainting episode. Do not drive yourself. Call your local emergency services (911 in [place] if no other help is available.  You have chest pain, nausea, or vomiting.  You develop severe or increased shortness of breath with activities.  You develop weakness or increased thirst.  You have a rapid heartbeat.  You develop unexplained sweating or become lightheaded when getting up from a chair or bed. MAKE SURE YOU:   Understand these instructions.  Will watch your condition.  Will get help right away if you are not doing well or get worse. Document Released: 08/30/2000 Document Revised: 11/25/2011 Document Reviewed: 01/09/2010 [facility] Patient Information 2014 [place], [place].

## 2013-07-20 NOTE — Telephone Encounter (Signed)
gv and printed appt sched and avs for pt for Feb 2015 °

## 2013-07-21 NOTE — Progress Notes (Signed)
[facility] OFFICE PROGRESS NOTE  [name], CCT [address] [place] [address]  DIAGNOSIS: Anemia - Plan: CBC with Differential, Comprehensive metabolic panel, Ferritin, Iron and TIBC  Angiodysplasia of intestine with hemorrhage - Plan: CBC with Differential, Comprehensive metabolic panel, Ferritin, Iron and TIBC  Chief Complaint  Patient presents with  . Microcytic anemia    CURRENT THERAPY: Feraheme 1020 mg IV prn.  Currently we are checking CBC and ferritin every month.  INTERVAL HISTORY: [name] 79 y.o. female with a a history of anemia secondary to iron deficiency and gastrointestinal bleeding from colonic arteriovenous malformations is here for follow-up.  She was last seen by Dr. Gerarda Fraction. [place].   She denies any recent emergency room visits or hospitalizations.  She recently saw her PCP with discontinuation in her blood pressure medications last visit.  Today, she is accompanied by her niece [place].   She continues to check her labs every one month.   On 12/07/2012, the patient came in for IV Feraheme 1020 mg. She tolerates her infusions well. She denies pagophagia or any hematochezia or melena.   MEDICAL HISTORY: Past Medical History  Diagnosis Date  . COPD (chronic obstructive pulmonary disease)   . Hypertrophic obstructive cardiomyopathy     Echo 10/10 EF 65-70% SW 1.6 PW 1.4 mild SAM no LVOT gradient at rest. Valsalva not performed Mild MR. No hyperenhance ment on MRI EF 69%.  . Coronary atherosclerosis of native coronary artery     Cath 1/11: RCA 50-60 ostial o/w normal. EF 70%.   . Pulmonary hypertension     Cath 1/11 RA 8, RV 43/6/14, PA 42/17 (29) PCWP 15 Fick  5.0/2.6. PVR 2.8    . Edema   . Occlusion and stenosis of carotid artery     s/p L CEA u/s 3/11. R 60-79%; L 40-59%. no change since 3/10  . Peripheral vascular disease, unspecified   . Hypertension   . Emphysema   . Asthma   . Hypothyroidism   . Allergic rhinitis    . Gastric AVM   . Heart murmur   . Anginal pain     "pressure"  . Emphysema   . Oxygen dependent 04/23/2012    "concentrated at night; liquid during day"  . Dyspnea 04/23/2012    "all the time"  . Anemia 04/23/2012    "severe @ times; think caused by colonic AVMs"  . History of blood transfusion     "many times"  . H/O hiatal hernia   . Stroke ~ 2004    denies residual on 04/23/2012  . Arthritis     "probably"  . Breast cancer   . Pneumonia     INTERIM HISTORY: has HYPOTHYROIDISM; HYPERTENSION; CAD, NATIVE VESSEL; PULMONARY HYPERTENSION, SECONDARY; Hypertrophic obstructive cardiomyopathy; CAROTID ARTERY DISEASE; C V A / STROKE; PVD; ALLERGIC RHINITIS; EMPHYSEMA; ASTHMA; COPD; EDEMA; DYSPNEA; ABNORMAL ECHOCARDIOGRAM; ABNORMAL EKG; BREAST CANCER, HX OF; HYPOTHYROIDISM; Anemia; GERD (gastroesophageal reflux disease); Exertional angina; Chest pain on exertion; CAD (coronary artery disease); Microcytic anemia; Angiodysplasia of intestine with hemorrhage; and Nonspecific abnormal finding in stool contents on her problem list.    ALLERGIES:  is allergic to latex.  MEDICATIONS: has a current medication list which includes the following prescription(s): albuterol, aspirin, atorvastatin, budesonide-formoterol, calcium carbonate, cholecalciferol, furosemide, hydrocodone-acetaminophen, levothyroxine, one-a-day extras antioxidant, pantoprazole, tiotropium, and tolterodine.  SURGICAL HISTORY:  Past Surgical History  Procedure Laterality Date  . Carotid endarterectomy  2005    left  . Thyroidectomy  2007  . Appendectomy  1950  . Breast lumpectomy  2002    right-hx of radiation  . Dilation and curettage of uterus  1950's  . Cataract extraction w/ intraocular lens implant  02/2011    right  . Excisional hemorrhoidectomy  1950's  . Cardiac catheterization     PROBLEM LIST:  1. Anemia secondary to iron deficiency and gastrointestinal bleeding from colonic arteriovenous malformations. The patient  received IV INFeD 1025 mg on 04/24/2012 when she was hospitalized. She has also received Feraheme 1020 mg IV on 07/17/2012, 08/21/2012 and 12/07/2012.  2. Chronic obstructive pulmonary disease (COPD) diagnosed in 1999, currently on home oxygen at 3 L/minute at rest and during sleep. The patient uses 4 L per minute with exertion. She has been on  home oxygen for approximately the past 5 years.  3. Pulmonary hypertension.  4. Right frontal arteriovenous malformation (AVM).  5. Coronary artery disease.  6. Diastolic dysfunction.  7. Past history of multiple strokes.  8. History of left carotid stenosis with left carotid endarterectomy  in 2006 at [facility]  9. Hypertension.  10. Dyslipidemia.  11. Ductal carcinoma in situ involving the right breast, status post  lumpectomy, radiation, and 5 years of tamoxifen.  12. Peripheral vascular disease.  13. Hiatal hernia.  14. Status post thyroidectomy in 2009 or 2010.  15. Osteoporosis.  16. Stroke involving right eye in April 2011, now with vision restored.  17. Diverticulosis.  18. Hearing impairment.  19. Systolic ejection murmur.  20. Hoarseness noted around December 2013. This was evaluated by an ENT specialist.   REVIEW OF SYSTEMS:   Constitutional: Denies fevers, chills or abnormal weight loss Eyes: Denies blurriness of vision Ears, nose, mouth, throat, and face: Denies mucositis or sore throat Respiratory: Denies cough, dyspnea or wheezes Cardiovascular: Denies palpitation, chest discomfort or lower extremity swelling Gastrointestinal:  Denies nausea, heartburn or change in bowel habits Skin: Denies abnormal skin rashes Lymphatics: Denies new lymphadenopathy or easy bruising Neurological:Denies numbness, tingling or new weaknesses Behavioral/Psych: Mood is stable, no new changes  All other systems were reviewed with the patient and are negative.  PHYSICAL EXAMINATION: ECOG PERFORMANCE STATUS: 0 - Asymptomatic  Blood  pressure 146/85, pulse 88, temperature 97.1 F (36.2 C), temperature source Oral, resp. rate 17, height 5\' 7"  (1.702 m), weight 167 lb 9.6 oz (76.023 kg), SpO2 93.00%.  GENERAL:alert, no distress and comfortable; elderly female who appears her stated age.  SKIN: skin color, texture, turgor are normal, no rashes or significant lesions EYES: normal, Conjunctiva are pink and non-injected, sclera clear OROPHARYNX:no exudate, no erythema and lips, buccal mucosa, and tongue normal  NECK: supple, thyroid normal size, non-tender, without nodularity LYMPH:  no palpable lymphadenopathy in the cervical, axillary or supraclavicular LUNGS: clear to auscultation and percussion with normal breathing effort HEART: regular rate & rhythm and no murmurs and no lower extremity edema ABDOMEN:abdomen soft, non-tender and normal bowel sounds Musculoskeletal:no cyanosis of digits and no clubbing  NEURO: alert & oriented x 3 with fluent speech, no focal motor/sensory deficits  LABORATORY DATA: Results for orders placed in visit on 07/20/13 (from the past 48 hour(s))  CBC WITH DIFFERENTIAL     Status: None   Collection Time    07/20/13  9:11 AM      Result Value Range   WBC 9.2  3.9 - 10.3 10e3/uL   NEUT# 5.8  1.5 - 6.5 10e3/uL   HGB 14.3  11.6 - 15.9 g/dL   HCT 43.8  34.8 - 46.6 %   Platelets 238  145 - 400 10e3/uL   MCV 92.8  79.5 - 101.0 fL   MCH 30.4  25.1 - 34.0 pg   MCHC 32.8  31.5 - 36.0 g/dL   RBC 9.56  2.13 - 0.86 10e6/uL   RDW 13.1  11.2 - 14.5 %   lymph# 2.1  0.9 - 3.3 10e3/uL   MONO# 0.9  0.1 - 0.9 10e3/uL   Eosinophils Absolute 0.3  0.0 - 0.5 10e3/uL   Basophils Absolute 0.1  0.0 - 0.1 10e3/uL   NEUT% 63.5  38.4 - 76.8 %   LYMPH% 23.2  14.0 - 49.7 %   MONO% 9.9  0.0 - 14.0 %   EOS% 2.7  0.0 - 7.0 %   BASO% 0.7  0.0 - 2.0 %  FERRITIN [facility]     Status: None   Collection Time    07/20/13  9:11 AM      Result Value Range   Ferritin 116  9 - 269 ng/ml  IRON AND TIBC [facility]     Status: None    Collection Time    07/20/13  9:11 AM      Result Value Range   Iron 106  41 - 142 ug/dL   TIBC 578  469 - 629 ug/dL   UIBC 528  413 - 244 ug/dL   %SAT 34  21 - 57 %  COMPREHENSIVE METABOLIC PANEL ([facility]13)     Status: Abnormal   Collection Time    07/20/13  9:11 AM      Result Value Range   Sodium 145  136 - 145 mEq/L   Potassium 4.3  3.5 - 5.1 mEq/L   Chloride 104  98 - 109 mEq/L   CO2 31 (*) 22 - 29 mEq/L   Glucose 100  70 - 140 mg/dl   BUN 01.0  7.0 - 27.2 mg/dL   Creatinine 0.7  0.6 - 1.1 mg/dL   Total Bilirubin 5.36  0.20 - 1.20 mg/dL   Alkaline Phosphatase 83  40 - 150 U/L   AST 19  5 - 34 U/L   ALT 16  0 - 55 U/L   Total Protein 7.5  6.4 - 8.3 g/dL   Albumin 3.9  3.5 - 5.0 g/dL   Calcium 9.9  8.4 - 64.4 mg/dL   Anion Gap 10  3 - 11 mEq/L    Labs:  Lab Results  Component Value Date   WBC 9.2 07/20/2013   HGB 14.3 07/20/2013   HCT 43.8 07/20/2013   MCV 92.8 07/20/2013   PLT 238 07/20/2013   NEUTROABS 5.8 07/20/2013      Chemistry      Component Value Date/Time   NA 145 07/20/2013 0911   NA 144 06/29/2012 1130   NA 145 04/25/2012 0520   K 4.3 07/20/2013 0911   K 5.2 06/29/2012 1130   CL 101 03/08/2013 1333   CL 101 06/29/2012 1130   CO2 31* 07/20/2013 0911   CO2 27 06/29/2012 1130   BUN 10.3 07/20/2013 0911   BUN 17 06/29/2012 1130   BUN 13 04/25/2012 0520   CREATININE 0.7 07/20/2013 0911   CREATININE 0.58 06/29/2012 1130      Component Value Date/Time   CALCIUM 9.9 07/20/2013 0911   CALCIUM 9.7 06/29/2012 1130   ALKPHOS 83 07/20/2013 0911   AST 19 07/20/2013 0911   ALT 16 07/20/2013 0911   BILITOT 0.56 07/20/2013 0911  Basic Metabolic Panel:  Recent Labs Lab 07/20/13 0911  NA 145  K 4.3  CO2 31*  GLUCOSE 100  BUN 10.3  CREATININE 0.7  CALCIUM 9.9   GFR Estimated Creatinine Clearance: 61.7 ml/min (by C-G formula based on Cr of 0.7). Liver Function Tests:  Recent Labs Lab 07/20/13 0911  AST 19  ALT 16  ALKPHOS 83  BILITOT 0.56  PROT 7.5   ALBUMIN 3.9   CBC:  Recent Labs Lab 07/20/13 0911  WBC 9.2  NEUTROABS 5.8  HGB 14.3  HCT 43.8  MCV 92.8  PLT 238   Anemia work up  Recent Labs  07/20/13 0911  FERRITIN 116  TIBC 309  IRON 106   Studies:  No results found.   RADIOGRAPHIC STUDIES: No results found.  ASSESSMENT: [name] 79 y.o. female with a history of Anemia - Plan: CBC with Differential, Comprehensive metabolic panel, Ferritin, Iron and TIBC  Angiodysplasia of intestine with hemorrhage - Plan: CBC with Differential, Comprehensive metabolic panel, Ferritin, Iron and TIBC   PLAN:  1. Anemia secondary to iron deficiency and GI bleeding.  --She continues to do well.  Her iron studies are as noted above with a ferritin of 116, TIBC of 309 and Iron level of 106. She remains asymptomatic for anemia with a hemoglobin 14.3 today.  She was provided a handout detailing the symptoms of anemia.  We will repeat her labs for symptoms given their overall stability and in 3 months.   2. Follow-up. -- Ms. Kosar will return in 4 months, at which time we will check CBC, chemistries, iron TIBC and ferritin.  All questions were answered. The patient knows to call the clinic with any problems, questions or concerns. We can certainly see the patient much sooner if necessary.  I spent 10 minutes counseling the patient face to face. The total time spent in the appointment was 15 minutes.    Efe Fazzino, MD 07/21/2013 5:56 PM

## 2013-09-23 ENCOUNTER — Ambulatory Visit: Payer: Medicare Other | Admitting: Emergency Medicine

## 2013-09-23 NOTE — Progress Notes (Signed)
This encounter was created in error - please disregard.

## 2013-10-04 ENCOUNTER — Other Ambulatory Visit: Payer: Self-pay | Admitting: Emergency Medicine

## 2013-10-08 ENCOUNTER — Ambulatory Visit (INDEPENDENT_AMBULATORY_CARE_PROVIDER_SITE_OTHER): Payer: Medicare Other | Admitting: Emergency Medicine

## 2013-10-08 ENCOUNTER — Encounter: Payer: Self-pay | Admitting: Emergency Medicine

## 2013-10-08 VITALS — BP 130/78 | HR 79 | Ht 66.5 in | Wt 171.0 lb

## 2013-10-08 DIAGNOSIS — J449 Chronic obstructive pulmonary disease, unspecified: Secondary | ICD-10-CM

## 2013-10-08 DIAGNOSIS — Z23 Encounter for immunization: Secondary | ICD-10-CM

## 2013-10-08 MED ORDER — TIOTROPIUM BROMIDE MONOHYDRATE 18 MCG IN CAPS: ORAL_CAPSULE | RESPIRATORY_TRACT | Status: DC

## 2013-10-08 NOTE — Patient Instructions (Signed)
Please continue your current inhaled medications as you have been taking them You received the Prevnar13 pneumococcal vaccine today Follow with Dr Lamonte Sakai in May 2015 or sooner if you have any problems.

## 2013-10-08 NOTE — Assessment & Plan Note (Addendum)
-   continue current regimen - discussed Prevnar today; will give to her today.

## 2013-10-08 NOTE — Progress Notes (Signed)
Subjective:    Patient ID: [name], female    DOB: May 01, 1933, 80 y.o.   MRN: [id number] HPI 23 former smoker, dx with COPD about 1999, also hx breast CA, CVA with R frontal AVM, colonic AVM's. Also diastolic dysfxn followed Dr Haroldine Laws. Was treated with Spiriva + Advair, since 2003. Now on Symbicort  10/30/10 -- follow up severe COPD. Flare last visit, treated with pred and abx, changed Advair to Symbicort to see if this helped with UA irritation and cough. We left her on Pred 10mg  by mouth once daily with plans to f/u and decide whether to stay on chronically.   02/14/11 Acute OV  Pt presents for a work in visit. Complains of increased SOB w/ exertion and sats into 70s w/ exertion, HR varying from the upper 40s to the 120s. Pt complains that over last couple of weeks she feels tired , low energy and more short of breath with walking. She has marathon helios that she has noticed she desats with walking into the 70s. She uses the demand setting. On the continuous flow she does not desat. Today in the office no desaturation with walking on 3 l/m continuous flow. HR ranges 100-120 with walking and at rest. On her portable oximeter her HR avg 40-120 , in the office HR maintained ~106-120 with walking . EKG w/ no acute changes w/ HR ~90 at rest. Does feel tight in chest with walking. Has some occasional wheezing. No discolored mucus or fever. Ankles have been puffy for last several days.  Recently changed from her bisoprolol to cardizem by cardiology.  ROV 03/05/11 -- severe COPD w hypoxemia. As above, recently seen for DOE and labile HR. Since last time was admitted for anemia, Hgb 6.0. In aftermath feels her breathing and fatigue are improved. Returns today for f/u. She had to have AVM's cauterized. Has been on chronic pred for last 6 months, currently on 5mg  daily. Her goal is to taper to zero. Currently on Symbicort, Spiriva, uses SABA prn not every day.   ROV 04/03/11 -- severe COPD. Last time we  decreased Pred to 3mg  qd. Since our visit she was readmitted for anemia, she is being followed by GI (Dr Vira Agar). She has had endoscopy, CSY, capsule endoscopy. She feels washed out from the anemia. No real breathing change, wheezing, coughing.   ROV 06/04/11 - written note. Decreased Pred to 1mg  qd.  ROV 07/29/11 -- severe COPD with hypoxemia, tapering steroids. Last time we decreased to 1mg  daily.  She presents with worsening dyspnea, wheeze. No real cough. Her SABA use has increased over the last week. She is less mobile in the home, needed wheelchair.  >>admitted   08/12/2011 [facility]  Admitted 11/12-11/15 for AECOPD , tx w/ IV abx , steroids and nebs. CXR without acute process.  Discharged on zpack and steroid taper. She is feeling better with decreased cough and dyspnea  Continued on Spiriva and Symbicort .  Currently on prednisone 10mg  daily  No chest pain , discolored mucus or fever   ROV 09/02/11 -- Severe COPD, has been tapering chronic pred. Hosp for AE in early Nov '12. Prednisone is down to 5mg  now. She has experienced B LE edema over the last month (? Can due to pred).   ROV 10/03/11 -- Severe COPD, has been tapering chronic pred. Last time we decreased pred from 5mg  to 3mg  daily. She tells me that she is still having problems w anemia (has had colonic AVM cauterized before). Hx  of diastolic dysfxn, ? R heart failure. She is planning to see cards today, may need diuretics adjusted.   ROV 12/03/11 -- Severe COPD, diastolic dysfxn, tapering chronic pred currently on 3mg . She is on Zyrtec, has been having some red eyes. Wearing O2 at night and prn. On spiriva + symbicort + SABA, averages once every few weeks.   ROV 01/28/12 -- Severe COPD, diastolic dysfxn, tapering chronic pred, last time decreased to 2mg  qd. She is a bit less active over the last year. Having good days and bad. She uses her O2 prn and at night. She has not flared.   02/24/2012 Acute OV  Complains of productive  cough,sob ,wheezing for 4 days.  Barky cough , worse for last few days Currently on prednisone 2mg   No hemotpysis or increased edema. No fever.   ROV 03/06/12 -- Severe COPD, diastolic dysfxn, tag pering chronic pred, last time decreased to 2mg  qd.  She was recently  Caught URI, was seen and treated as above. Now tapered back down to 2mg  Pred qd.  She continues to have cough and fatigue. The cough is non-prod. She is still having nasal gtt and throat congestion.   Follow up 03/13/12 -  Last visit with COPD flare , tx Levaquin and Prednisone taper  Returns today feeling better. Has few days left of prednisone. Last day of Levaquin.  CXR last ov w/ no acute finding.  Cough and wheezing are decreased but not gone.  Mucus is clear only. No fever.  Currently on prednisone 20mg  daily  No hemoptysis or edema.   ROV 04/23/12 -- Severe COPD, HTN + diastolic dysfxn, hypoxemia. Has been on chronic pred, recent AE as above. Tapered down to Pred 5mg  since another burst 6/28. She presents today with worsening SOB, chest pain especially with exertion or talking. This has been happening for about 1.5 week. She also has more LE edema, more wheezing. She has increased her ProAir use but it hasn't helped very much. Taking protonix qd.    ROV 06/01/12 -- Severe COPD, HTN + diastolic dysfxn, hypoxemia. Has been on chronic pred. Last time I saw her [facility] related to anemia. She is having exertional CP and dyspnea. Her last Hgb (her report) was 12.2. Seen by Dr Olevia Perches in the hospital.   ROV 08/03/12 -- Severe COPD, HTN + diastolic dysfxn, hypoxemia. Also with CAD and hx symptomatic anemia. Has been on chronic pred > current dose 5mg . Stable breathing (still w DOE). No flares since last hospitalization. Recently started on diuretic by PCP, following wt.   ROV 09/17/12 -- Severe COPD, HTN + diastolic dysfxn, hypoxemia. Also with CAD and hx symptomatic anemia. Last time we decreased pred  from 5mg  to 3mg  qd. No flares. Exercise tolerance is stable - does the step machine every day. Wears her o2 w exertion and w sleep. We have had her pred as low as 1mg  in the past, but have not weaned completely off in over 2 years.   ROV 11/12/12 -- Severe COPD, HTN + diastolic dysfxn, hypoxemia. Also with CAD and hx symptomatic anemia. We have been weaning pred - had to go back up in January when she had some flaring, but now has weaned back down to 2mg  qd. She is having hoarse throat, lots of clearing. On flonase and protonix.   ROV 03/04/13 -- Severe COPD, HTN + diastolic dysfxn, hypoxemia. Also with CAD and hx symptomatic anemia. We have weaned pred down  to 1mg  daily. Her breathing is at baseline. Spiriva + symbicort, rare albuterol. She has some fatigue, low appetite. She has an episode of L lower CP 6/18. She thought is was GERD.   ROV 04/14/13 -- Severe COPD, HTN + diastolic dysfxn, hypoxemia. Also with CAD and hx symptomatic anemia (with dyspnea). Last visit we weaned her prednisone to off, this has been done slowly over several months. Remains on Symbicort, Spiriva. Has been taking Symbicort qhs instead of BID. She has done well off the pred x 1 month, but for the last 3 days more DOE and wheeze, resolved by albuterol. No cough or URI sx, no PND.    ROV 05/14/13 -- Severe COPD, HTN + diastolic dysfxn, hypoxemia, symptomatic anemia. Last time we increased her symbicort to bid. Her breathing is doing well. She is able to exert some, but limited. She is not wheezing.   ROV 10/08/13 -- Severe COPD, HTN + diastolic dysfxn, hypoxemia, symptomatic anemia. She follows with Dr Juliann Mule with H/O for her Fe-def and anemia. She is breathing fairly well, did have some difficulty today with walking into the office. She is on Symbicort, Spiriva. Off prednisone. Uses albuterol a couple times a week.    Objective:  Physical Exam Filed Vitals:   10/08/13 1424  BP: 130/78  Pulse: 79  Height: 5' 6.5" (1.689 m)   Weight: 171 lb (77.565 kg)  SpO2: 94%   GEN: A/Ox3; pleasant , NAD, elderly female   HEENT:  [place]/AT,  EACs-clear, TMs-wnl, NOSE-clear, THROAT-clear, no lesions, no postnasal drip or exudate noted.   NECK:  Supple w/ fair ROM; no JVD; normal carotid impulses w/o bruits; no thyromegaly or nodules palpated; no lymphadenopathy.  RESP  Few soft basilar insp crackles B, B exp wheezes L > R  CARD:  RRR, Gr 3/6 holosystolic blowing M without an S2, loudest at R sternal border  Musco: Warm bil, no deformities or joint swelling noted.   Neuro: alert, no focal deficits noted.    Skin: Warm, no lesions or rashes   Assessment & Plan:   COPD - continue current regimen - discussed Prevnar today; will give to her today.

## 2013-10-19 ENCOUNTER — Ambulatory Visit (HOSPITAL_BASED_OUTPATIENT_CLINIC_OR_DEPARTMENT_OTHER): Payer: Medicare Other | Admitting: Internal Medicine

## 2013-10-19 ENCOUNTER — Other Ambulatory Visit (HOSPITAL_BASED_OUTPATIENT_CLINIC_OR_DEPARTMENT_OTHER): Payer: Medicare Other

## 2013-10-19 ENCOUNTER — Telehealth: Payer: Self-pay | Admitting: Internal Medicine

## 2013-10-19 ENCOUNTER — Telehealth: Payer: Self-pay

## 2013-10-19 VITALS — BP 149/81 | HR 97 | Temp 97.3°F | Resp 17 | Ht 66.5 in | Wt 166.0 lb

## 2013-10-19 DIAGNOSIS — K922 Gastrointestinal hemorrhage, unspecified: Secondary | ICD-10-CM

## 2013-10-19 DIAGNOSIS — D509 Iron deficiency anemia, unspecified: Secondary | ICD-10-CM

## 2013-10-19 DIAGNOSIS — K5521 Angiodysplasia of colon with hemorrhage: Secondary | ICD-10-CM

## 2013-10-19 DIAGNOSIS — D649 Anemia, unspecified: Secondary | ICD-10-CM

## 2013-10-19 LAB — CBC WITH DIFFERENTIAL/PLATELET
BASO%: 1.1 % (ref 0.0–2.0)
BASOS ABS: 0.1 10*3/uL (ref 0.0–0.1)
EOS%: 3.8 % (ref 0.0–7.0)
Eosinophils Absolute: 0.4 10*3/uL (ref 0.0–0.5)
HCT: 43.7 % (ref 34.8–46.6)
HEMOGLOBIN: 14.3 g/dL (ref 11.6–15.9)
LYMPH#: 2.5 10*3/uL (ref 0.9–3.3)
LYMPH%: 23 % (ref 14.0–49.7)
MCH: 30 pg (ref 25.1–34.0)
MCHC: 32.6 g/dL (ref 31.5–36.0)
MCV: 91.9 fL (ref 79.5–101.0)
MONO#: 1.1 10*3/uL — ABNORMAL HIGH (ref 0.1–0.9)
MONO%: 9.9 % (ref 0.0–14.0)
NEUT#: 6.8 10*3/uL — ABNORMAL HIGH (ref 1.5–6.5)
NEUT%: 62.2 % (ref 38.4–76.8)
Platelets: 264 10*3/uL (ref 145–400)
RBC: 4.75 10*6/uL (ref 3.70–5.45)
RDW: 12.9 % (ref 11.2–14.5)
WBC: 10.9 10*3/uL — ABNORMAL HIGH (ref 3.9–10.3)

## 2013-10-19 LAB — COMPREHENSIVE METABOLIC PANEL (CC13)
ALT: 17 U/L (ref 0–55)
AST: 22 U/L (ref 5–34)
Albumin: 4.3 g/dL (ref 3.5–5.0)
Alkaline Phosphatase: 92 U/L (ref 40–150)
Anion Gap: 10 meq/L (ref 3–11)
BUN: 14.9 mg/dL (ref 7.0–26.0)
CO2: 32 meq/L — ABNORMAL HIGH (ref 22–29)
Calcium: 9.8 mg/dL (ref 8.4–10.4)
Chloride: 100 meq/L (ref 98–109)
Creatinine: 0.7 mg/dL (ref 0.6–1.1)
Glucose: 86 mg/dL (ref 70–140)
Potassium: 4.2 meq/L (ref 3.5–5.1)
Sodium: 143 meq/L (ref 136–145)
Total Bilirubin: 0.53 mg/dL (ref 0.20–1.20)
Total Protein: 7.9 g/dL (ref 6.4–8.3)

## 2013-10-19 LAB — IRON AND TIBC CHCC
%SAT: 29 % (ref 21–57)
Iron: 100 ug/dL (ref 41–142)
TIBC: 340 ug/dL (ref 236–444)
UIBC: 240 ug/dL (ref 120–384)

## 2013-10-19 LAB — FERRITIN CHCC: Ferritin: 84 ng/mL (ref 9–269)

## 2013-10-19 NOTE — Telephone Encounter (Signed)
Called requesting results of labs drawn in Dec. and Jan. (CBC & ferritin).

## 2013-10-19 NOTE — Progress Notes (Signed)
[place] OFFICE PROGRESS NOTE  [name], CCT [address] [place] [address]  DIAGNOSIS: Microcytic anemia - Plan: CBC with Differential, Comprehensive metabolic panel (Cmet) - [facility], Lactate dehydrogenase (LDH) - [facility], Ferritin, Iron and TIBC  Anemia  Chief Complaint  Patient presents with  . Microcytic anemia    CURRENT THERAPY: Feraheme 1020 mg IV prn.  Currently we are checking CBC and ferritin every month.  INTERVAL HISTORY: [name] 80 y.o. female with a a history of anemia secondary to iron deficiency and gastrointestinal bleeding from colonic arteriovenous malformations is here for follow-up.  She was last seen by me on 07/20/2013.   She denies any recent emergency room visits or hospitalizations.  She recently saw her PCP with discontinuation in her blood pressure medications last visit. Today, she is accompanied by her niece [name].   She continues to check her labs every one month.   On 12/07/2012, the patient came in for IV Feraheme 1020 mg. She tolerates her infusions well. She denies pagophagia or any hematochezia or melena. She remains asymptomatic for symptoms of anemia   MEDICAL HISTORY: Past Medical History  Diagnosis Date  . COPD (chronic obstructive pulmonary disease)   . Hypertrophic obstructive cardiomyopathy     Echo 10/10 EF 65-70% SW 1.6 PW 1.4 mild SAM no LVOT gradient at rest. Valsalva not performed Mild MR. No hyperenhance ment on MRI EF 69%.  . Coronary atherosclerosis of native coronary artery     Cath 1/11: RCA 50-60 ostial o/w normal. EF 70%.   . Pulmonary hypertension     Cath 1/11 RA 8, RV 43/6/14, PA 42/17 (29) PCWP 15 Fick  5.0/2.6. PVR 2.8    . Edema   . Occlusion and stenosis of carotid artery     s/p L CEA u/s 3/11. R 60-79%; L 40-59%. no change since 3/10  . Peripheral vascular disease, unspecified   . Hypertension   . Emphysema   . Asthma   . Hypothyroidism   . Allergic rhinitis   . Gastric AVM   .  Heart murmur   . Anginal pain     "pressure"  . Emphysema   . Oxygen dependent 04/23/2012    "concentrated at night; liquid during day"  . Dyspnea 04/23/2012    "all the time"  . Anemia 04/23/2012    "severe @ times; think caused by colonic AVMs"  . History of blood transfusion     "many times"  . H/O hiatal hernia   . Stroke ~ 2004    denies residual on 04/23/2012  . Arthritis     "probably"  . Breast cancer   . Pneumonia     INTERIM HISTORY: has HYPOTHYROIDISM; HYPERTENSION; CAD, NATIVE VESSEL; PULMONARY HYPERTENSION, SECONDARY; Hypertrophic obstructive cardiomyopathy; CAROTID ARTERY DISEASE; C V A / STROKE; PVD; ALLERGIC RHINITIS; EMPHYSEMA; ASTHMA; COPD; EDEMA; DYSPNEA; ABNORMAL ECHOCARDIOGRAM; ABNORMAL EKG; BREAST CANCER, HX OF; HYPOTHYROIDISM; Anemia; GERD (gastroesophageal reflux disease); Exertional angina; Chest pain on exertion; CAD (coronary artery disease); Microcytic anemia; Angiodysplasia of intestine with hemorrhage; and Nonspecific abnormal finding in stool contents on her problem list.    ALLERGIES:  is allergic to latex.  MEDICATIONS: has a current medication list which includes the following prescription(s): albuterol, aspirin, atorvastatin, budesonide-formoterol, calcium carbonate, cholecalciferol, furosemide, hydrocodone-acetaminophen, levothyroxine, one-a-day extras antioxidant, pantoprazole, tiotropium, and tolterodine.  SURGICAL HISTORY:  Past Surgical History  Procedure Laterality Date  . Carotid endarterectomy  2005    left  . Thyroidectomy  2007  .  Appendectomy  1950  . Breast lumpectomy  2002    right-hx of radiation  . Dilation and curettage of uterus  1950's  . Cataract extraction w/ intraocular lens implant  02/2011    right  . Excisional hemorrhoidectomy  1950's  . Cardiac catheterization     PROBLEM LIST:  1. Anemia secondary to iron deficiency and gastrointestinal bleeding from colonic arteriovenous malformations. The patient received IV INFeD 1025  mg on 04/24/2012 when she was hospitalized. She has also received Feraheme 1020 mg IV on 07/17/2012, 08/21/2012 and 12/07/2012.  2. Chronic obstructive pulmonary disease (COPD) diagnosed in 1999, currently on home oxygen at 3 L/minute at rest and during sleep. The patient uses 4 L per minute with exertion. She has been on home oxygen for approximately the past 5 years.  3. Pulmonary hypertension.  4. Right frontal arteriovenous malformation (AVM).  5. Coronary artery disease.  6. Diastolic dysfunction.  7. Past history of multiple strokes.  8. History of left carotid stenosis with left carotid endarterectomy  in 2006 at [facility].  9. Hypertension.  10. Dyslipidemia.  11. Ductal carcinoma in situ involving the right breast, status post  lumpectomy, radiation, and 5 years of tamoxifen.  12. Peripheral vascular disease.  13. Hiatal hernia.  14. Status post thyroidectomy in 2009 or 2010.  15. Osteoporosis.  16. Stroke involving right eye in April 2011, now with vision restored.  17. Diverticulosis.  18. Hearing impairment.  19. Systolic ejection murmur.  20. Hoarseness noted around December 2013. This was evaluated by an ENT specialist.   REVIEW OF SYSTEMS:   Constitutional: Denies fevers, chills or abnormal weight loss Eyes: Denies blurriness of vision Ears, nose, mouth, throat, and face: Denies mucositis or sore throat Respiratory: Denies cough, dyspnea or wheezes Cardiovascular: Denies palpitation, chest discomfort or lower extremity swelling Gastrointestinal:  Denies nausea, heartburn or change in bowel habits Skin: Denies abnormal skin rashes Lymphatics: Denies new lymphadenopathy or easy bruising Neurological:Denies numbness, tingling or new weaknesses Behavioral/Psych: Mood is stable, no new changes  All other systems were reviewed with the patient and are negative.  PHYSICAL EXAMINATION: ECOG PERFORMANCE STATUS: 0 - Asymptomatic  Blood pressure 149/81, pulse 97,  temperature 97.3 F (36.3 C), temperature source Oral, resp. rate 17, height 5' 6.5" (1.689 m), weight 166 lb (75.297 kg), SpO2 93.00%.  GENERAL:alert, no distress and comfortable; elderly female who appears her stated age.  SKIN: skin color, texture, turgor are normal, no rashes or significant lesions EYES: normal, Conjunctiva are pink and non-injected, sclera clear OROPHARYNX:no exudate, no erythema and lips, buccal mucosa, and tongue normal  NECK: supple, thyroid normal size, non-tender, without nodularity LYMPH:  no palpable lymphadenopathy in the cervical, axillary or supraclavicular LUNGS: clear to auscultation and percussion with normal breathing effort HEART: regular rate & rhythm and no murmurs and no lower extremity edema ABDOMEN:abdomen soft, non-tender and normal bowel sounds Musculoskeletal:no cyanosis of digits and no clubbing  NEURO: alert & oriented x 3 with fluent speech, no focal motor/sensory deficits  LABORATORY DATA: Results for orders placed in visit on 10/19/13 (from the past 48 hour(s))  CBC WITH DIFFERENTIAL     Status: Abnormal   Collection Time    10/19/13  9:58 AM      Result Value Range   WBC 10.9 (*) 3.9 - 10.3 10e3/uL   NEUT# 6.8 (*) 1.5 - 6.5 10e3/uL   HGB 14.3  11.6 - 15.9 g/dL   HCT 43.7  34.8 - 46.6 %  Platelets 264  145 - 400 10e3/uL   MCV 91.9  79.5 - 101.0 fL   MCH 30.0  25.1 - 34.0 pg   MCHC 32.6  31.5 - 36.0 g/dL   RBC 4.75  3.70 - 5.45 10e6/uL   RDW 12.9  11.2 - 14.5 %   lymph# 2.5  0.9 - 3.3 10e3/uL   MONO# 1.1 (*) 0.1 - 0.9 10e3/uL   Eosinophils Absolute 0.4  0.0 - 0.5 10e3/uL   Basophils Absolute 0.1  0.0 - 0.1 10e3/uL   NEUT% 62.2  38.4 - 76.8 %   LYMPH% 23.0  14.0 - 49.7 %   MONO% 9.9  0.0 - 14.0 %   EOS% 3.8  0.0 - 7.0 %   BASO% 1.1  0.0 - 2.0 %  FERRITIN [facility]     Status: None   Collection Time    10/19/13  9:58 AM      Result Value Range   Ferritin 84  9 - 269 ng/ml  IRON AND TIBC [facility]     Status: None   Collection Time     10/19/13  9:58 AM      Result Value Range   Iron 100  41 - 142 ug/dL   TIBC 340  236 - 444 ug/dL   UIBC 240  120 - 384 ug/dL   %SAT 29  21 - 57 %  COMPREHENSIVE METABOLIC PANEL (0000000)     Status: Abnormal   Collection Time    10/19/13  9:58 AM      Result Value Range   Sodium 143  136 - 145 mEq/L   Potassium 4.2  3.5 - 5.1 mEq/L   Chloride 100  98 - 109 mEq/L   CO2 32 (*) 22 - 29 mEq/L   Glucose 86  70 - 140 mg/dl   BUN 14.9  7.0 - 26.0 mg/dL   Creatinine 0.7  0.6 - 1.1 mg/dL   Total Bilirubin 0.53  0.20 - 1.20 mg/dL   Alkaline Phosphatase 92  40 - 150 U/L   AST 22  5 - 34 U/L   ALT 17  0 - 55 U/L   Total Protein 7.9  6.4 - 8.3 g/dL   Albumin 4.3  3.5 - 5.0 g/dL   Calcium 9.8  8.4 - 10.4 mg/dL   Anion Gap 10  3 - 11 mEq/L    Labs:  Lab Results  Component Value Date   WBC 10.9* 10/19/2013   HGB 14.3 10/19/2013   HCT 43.7 10/19/2013   MCV 91.9 10/19/2013   PLT 264 10/19/2013   NEUTROABS 6.8* 10/19/2013      Chemistry      Component Value Date/Time   NA 143 10/19/2013 0958   NA 144 06/29/2012 1130   NA 145 04/25/2012 0520   K 4.2 10/19/2013 0958   K 5.2 06/29/2012 1130   CL 101 03/08/2013 1333   CL 101 06/29/2012 1130   CO2 32* 10/19/2013 0958   CO2 27 06/29/2012 1130   BUN 14.9 10/19/2013 0958   BUN 17 06/29/2012 1130   BUN 13 04/25/2012 0520   CREATININE 0.7 10/19/2013 0958   CREATININE 0.58 06/29/2012 1130      Component Value Date/Time   CALCIUM 9.8 10/19/2013 0958   CALCIUM 9.7 06/29/2012 1130   ALKPHOS 92 10/19/2013 0958   AST 22 10/19/2013 0958   ALT 17 10/19/2013 0958   BILITOT 0.53 10/19/2013 0958     Basic Metabolic Panel:  Recent  Labs Lab 10/19/13 0958  NA 143  K 4.2  CO2 32*  GLUCOSE 86  BUN 14.9  CREATININE 0.7  CALCIUM 9.8   GFR Estimated Creatinine Clearance: 59.8 ml/min (by C-G formula based on Cr of 0.7). Liver Function Tests:  Recent Labs Lab 10/19/13 0958  AST 22  ALT 17  ALKPHOS 92  BILITOT 0.53  PROT 7.9  ALBUMIN 4.3   CBC:  Recent  Labs Lab 10/19/13 0958  WBC 10.9*  NEUTROABS 6.8*  HGB 14.3  HCT 43.7  MCV 91.9  PLT 264   Anemia work up  Recent Labs  10/19/13 0958  FERRITIN 84  TIBC 340  IRON 100   Studies:  No results found.   RADIOGRAPHIC STUDIES: No results found.  ASSESSMENT: [name] 80 y.o. female with a history of Microcytic anemia - Plan: CBC with Differential, Comprehensive metabolic panel (Cmet) - [facility], Lactate dehydrogenase (LDH) - [facility], Ferritin, Iron and TIBC  Anemia   PLAN:  1. Anemia secondary to iron deficiency and GI bleeding.  --She continues to do well.  Her iron studies are as noted above with a ferritin of 84, TIBC of 340 and Iron level of 100. She remains asymptomatic for anemia with a hemoglobin 14.3 today. Her hemoglobin has been stable over the past 8 months.   She was provided a handout detailing the symptoms of anemia.  We will repeat her labs for symptoms given their overall stability and in 4 months. We will check her labs every 2 months given her overall stability.  She was counseled should she experience any symptoms of anemia, to please call and we will arrange for complete blood count to be collected.   2. Follow-up. -- Ms. Saarinen will return in 4 months, at which time we will check CBC, chemistries, iron TIBC and ferritin.  All questions were answered. The patient knows to call the clinic with any problems, questions or concerns. We can certainly see the patient much sooner if necessary.  I spent 10 minutes counseling the patient face to face. The total time spent in the appointment was 15 minutes.    Erby Sanderson, MD 10/19/2013 11:17 AM

## 2013-10-19 NOTE — Telephone Encounter (Signed)
Gave pt appt for lab and Md for June 2015 °

## 2013-10-19 NOTE — Patient Instructions (Signed)
Anemia, Nonspecific Anemia is a condition in which the concentration of red blood cells or hemoglobin in the blood is below normal. Hemoglobin is a substance in red blood cells that carries oxygen to the tissues of the body. Anemia results in not enough oxygen reaching these tissues.  CAUSES  Common causes of anemia include:   Excessive bleeding. Bleeding may be internal or external. This includes excessive bleeding from periods (in women) or from the intestine.   Poor nutrition.   Chronic kidney, thyroid, and liver disease.  Bone marrow disorders that decrease red blood cell production.  Cancer and treatments for cancer.  HIV, AIDS, and their treatments.  Spleen problems that increase red blood cell destruction.  Blood disorders.  Excess destruction of red blood cells due to infection, medicines, and autoimmune disorders. SIGNS AND SYMPTOMS   Minor weakness.   Dizziness.   Headache.  Palpitations.   Shortness of breath, especially with exercise.   Paleness.  Cold sensitivity.  Indigestion.  Nausea.  Difficulty sleeping.  Difficulty concentrating. Symptoms may occur suddenly or they may develop slowly.  DIAGNOSIS  Additional blood tests are often needed. These help your health care provider determine the best treatment. Your health care provider will check your stool for blood and look for other causes of blood loss.  TREATMENT  Treatment varies depending on the cause of the anemia. Treatment can include:   Supplements of iron, vitamin F81, or folic acid.   Hormone medicines.   A blood transfusion. This may be needed if blood loss is severe.   Hospitalization. This may be needed if there is significant continual blood loss.   Dietary changes.  Spleen removal. HOME CARE INSTRUCTIONS Keep all follow-up appointments. It often takes many weeks to correct anemia, and having your health care provider check on your condition and your response to  treatment is very important. SEEK IMMEDIATE MEDICAL CARE IF:   You develop extreme weakness, shortness of breath, or chest pain.   You become dizzy or have trouble concentrating.  You develop heavy vaginal bleeding.   You develop a rash.   You have bloody or black, tarry stools.   You faint.   You vomit up blood.   You vomit repeatedly.   You have abdominal pain.  You have a fever or persistent symptoms for more than 2 3 days.   You have a fever and your symptoms suddenly get worse.   You are dehydrated.  MAKE SURE YOU:  Understand these instructions.  Will watch your condition.  Will get help right away if you are not doing well or get worse. Document Released: 10/10/2004 Document Revised: 05/05/2013 Document Reviewed: 02/26/2013 [facility] Patient Information 2014 [place]. Ferritin This is done to test for anemia. Anemia occurs when the amount of hemoglobin (found in the red blood cells) drops below normal. Hemoglobin is necessary for the transportation of oxygen throughout the body. Blood tests may show a variety of common, treatable abnormalities that can lead to problems associated with anemia. Iron deficiency anemia is the most common of the anemias. It is usually due to bleeding. In women, iron deficiency may be due to heavy menstrual periods. In older women and in men, the bleeding is usually from disease of the intestines. In children and in pregnant women, the body needs more iron. Iron deficiency may be due to simply not eating enough iron in the diet. Iron deficiency may also result from some extreme diets. Treatment of iron deficiency usually involves iron supplements.  In older women and in men, there is usually some further testing needed to determine why the person is iron deficient.  PREPARATION FOR TEST A blood sample is obtained by inserting a needle into a vein in the arm. NORMAL FINDINGS Female: 12-300ng/ml or 12-300  g/L (SI units) Female:   10-150 ng/ml or 10-150  g/L (SI units) Children/adolescent:  Newborn: 25-200 ng/ml  1 month: 200-600 ng/ml  2-5 months: 50-200 ng/ml  6 months-15 years: 7-142 ng/ml Ranges for normal findings may vary among different laboratories and hospitals. You should always check with your doctor after having lab work or other tests done to discuss the meaning of your test results and whether your values are considered within normal limits. MEANING OF TEST  Your caregiver will go over the test results with you and discuss the importance and meaning of your results, as well as treatment options and the need for additional tests if necessary. OBTAINING THE TEST RESULTS  It is your responsibility to obtain your test results. Ask the lab or department performing the test when and how you will get your results. Document Released: 09/25/2004 Document Revised: 11/25/2011 Document Reviewed: 08/12/2008 [facility] Patient Information 2014 [place], [place].

## 2014-01-03 ENCOUNTER — Telehealth: Payer: Self-pay

## 2014-01-03 NOTE — Telephone Encounter (Signed)
We received message about hgb 13 and ferritin being 40. Called [facility] to get labs faxed to [place]. Called back [place] to let her know this is in process and to expect a call in the morning. Her mother is feeling a little slower and she wants to stave off needing a blood transfusion. Maybe a feraheme infusion needed.

## 2014-01-04 ENCOUNTER — Telehealth: Payer: Self-pay | Admitting: Medical Oncology

## 2014-01-04 ENCOUNTER — Other Ambulatory Visit: Payer: Self-pay | Admitting: Medical Oncology

## 2014-01-04 NOTE — Telephone Encounter (Signed)
I called the pt to let her know that Dr. Juliann Mule reviewed the labs from [facility]. Her ferritin is 40 is and Hgb 13. 4. Due to her history he is going to set her up for some feraheme. POF made. I called Melissa- niece with this information.

## 2014-01-05 ENCOUNTER — Telehealth: Payer: Self-pay | Admitting: Internal Medicine

## 2014-01-05 NOTE — Telephone Encounter (Signed)
S/w the pt and she is aware of her iron infusion appt on 01/06/2014 at 2:30pm

## 2014-01-06 ENCOUNTER — Other Ambulatory Visit: Payer: Self-pay | Admitting: Internal Medicine

## 2014-01-06 ENCOUNTER — Ambulatory Visit (HOSPITAL_BASED_OUTPATIENT_CLINIC_OR_DEPARTMENT_OTHER): Payer: Medicare Other

## 2014-01-06 VITALS — BP 141/54 | HR 89 | Temp 98.2°F | Resp 20

## 2014-01-06 DIAGNOSIS — D649 Anemia, unspecified: Secondary | ICD-10-CM

## 2014-01-06 DIAGNOSIS — D509 Iron deficiency anemia, unspecified: Secondary | ICD-10-CM

## 2014-01-06 MED ORDER — SODIUM CHLORIDE 0.9 % IV SOLN
Freq: Once | INTRAVENOUS | Status: AC
  Administered 2014-01-06: 15:00:00 via INTRAVENOUS

## 2014-01-06 MED ORDER — SODIUM CHLORIDE 0.9 % IJ SOLN
3.0000 mL | Freq: Once | INTRAMUSCULAR | Status: AC | PRN
  Filled 2014-01-06: qty 10

## 2014-01-06 MED ORDER — SODIUM CHLORIDE 0.9 % IV SOLN
510.0000 mg | Freq: Once | INTRAVENOUS | Status: AC
  Administered 2014-01-06: 510 mg via INTRAVENOUS
  Filled 2014-01-06: qty 17

## 2014-01-06 NOTE — Patient Instructions (Signed)

## 2014-01-08 ENCOUNTER — Other Ambulatory Visit: Payer: Self-pay | Admitting: Emergency Medicine

## 2014-01-10 ENCOUNTER — Inpatient Hospital Stay (HOSPITAL_COMMUNITY): Payer: Medicare Other

## 2014-01-10 ENCOUNTER — Inpatient Hospital Stay (HOSPITAL_COMMUNITY)
Admission: EM | Admit: 2014-01-10 | Discharge: 2014-01-12 | DRG: 378 | Disposition: A | Discharge status: Home / Self Care | Payer: Medicare Other | Attending: Internal Medicine | Admitting: Internal Medicine

## 2014-01-10 ENCOUNTER — Observation Stay (HOSPITAL_COMMUNITY): Payer: Medicare Other

## 2014-01-10 ENCOUNTER — Encounter (HOSPITAL_COMMUNITY): Payer: Self-pay | Admitting: Emergency Medicine

## 2014-01-10 ENCOUNTER — Telehealth: Payer: Self-pay | Admitting: Medical Oncology

## 2014-01-10 DIAGNOSIS — J438 Other emphysema: Secondary | ICD-10-CM | POA: Diagnosis present

## 2014-01-10 DIAGNOSIS — Z79899 Other long term (current) drug therapy: Secondary | ICD-10-CM

## 2014-01-10 DIAGNOSIS — Z8249 Family history of ischemic heart disease and other diseases of the circulatory system: Secondary | ICD-10-CM

## 2014-01-10 DIAGNOSIS — J309 Allergic rhinitis, unspecified: Secondary | ICD-10-CM

## 2014-01-10 DIAGNOSIS — Z853 Personal history of malignant neoplasm of breast: Secondary | ICD-10-CM

## 2014-01-10 DIAGNOSIS — J449 Chronic obstructive pulmonary disease, unspecified: Secondary | ICD-10-CM | POA: Diagnosis present

## 2014-01-10 DIAGNOSIS — I10 Essential (primary) hypertension: Secondary | ICD-10-CM | POA: Diagnosis present

## 2014-01-10 DIAGNOSIS — Z800 Family history of malignant neoplasm of digestive organs: Secondary | ICD-10-CM

## 2014-01-10 DIAGNOSIS — Z825 Family history of asthma and other chronic lower respiratory diseases: Secondary | ICD-10-CM

## 2014-01-10 DIAGNOSIS — I2510 Atherosclerotic heart disease of native coronary artery without angina pectoris: Secondary | ICD-10-CM | POA: Diagnosis present

## 2014-01-10 DIAGNOSIS — K219 Gastro-esophageal reflux disease without esophagitis: Secondary | ICD-10-CM | POA: Diagnosis present

## 2014-01-10 DIAGNOSIS — D509 Iron deficiency anemia, unspecified: Secondary | ICD-10-CM | POA: Diagnosis present

## 2014-01-10 DIAGNOSIS — I6529 Occlusion and stenosis of unspecified carotid artery: Secondary | ICD-10-CM | POA: Diagnosis present

## 2014-01-10 DIAGNOSIS — I6789 Other cerebrovascular disease: Secondary | ICD-10-CM

## 2014-01-10 DIAGNOSIS — I421 Obstructive hypertrophic cardiomyopathy: Secondary | ICD-10-CM | POA: Diagnosis present

## 2014-01-10 DIAGNOSIS — Z823 Family history of stroke: Secondary | ICD-10-CM

## 2014-01-10 DIAGNOSIS — K5730 Diverticulosis of large intestine without perforation or abscess without bleeding: Secondary | ICD-10-CM | POA: Diagnosis present

## 2014-01-10 DIAGNOSIS — I739 Peripheral vascular disease, unspecified: Secondary | ICD-10-CM | POA: Diagnosis present

## 2014-01-10 DIAGNOSIS — K922 Gastrointestinal hemorrhage, unspecified: Principal | ICD-10-CM | POA: Diagnosis present

## 2014-01-10 DIAGNOSIS — I2789 Other specified pulmonary heart diseases: Secondary | ICD-10-CM | POA: Diagnosis present

## 2014-01-10 DIAGNOSIS — Z9981 Dependence on supplemental oxygen: Secondary | ICD-10-CM

## 2014-01-10 DIAGNOSIS — E039 Hypothyroidism, unspecified: Secondary | ICD-10-CM | POA: Diagnosis present

## 2014-01-10 DIAGNOSIS — D62 Acute posthemorrhagic anemia: Secondary | ICD-10-CM | POA: Diagnosis present

## 2014-01-10 DIAGNOSIS — K5520 Angiodysplasia of colon without hemorrhage: Secondary | ICD-10-CM | POA: Diagnosis present

## 2014-01-10 DIAGNOSIS — Z87891 Personal history of nicotine dependence: Secondary | ICD-10-CM

## 2014-01-10 DIAGNOSIS — J45909 Unspecified asthma, uncomplicated: Secondary | ICD-10-CM | POA: Diagnosis present

## 2014-01-10 DIAGNOSIS — E785 Hyperlipidemia, unspecified: Secondary | ICD-10-CM | POA: Diagnosis present

## 2014-01-10 DIAGNOSIS — Z8673 Personal history of transient ischemic attack (TIA), and cerebral infarction without residual deficits: Secondary | ICD-10-CM

## 2014-01-10 DIAGNOSIS — Z7982 Long term (current) use of aspirin: Secondary | ICD-10-CM

## 2014-01-10 DIAGNOSIS — Z923 Personal history of irradiation: Secondary | ICD-10-CM

## 2014-01-10 LAB — CBC WITH DIFFERENTIAL/PLATELET
Basophils Absolute: 0.1 10*3/uL (ref 0.0–0.1)
Basophils Relative: 1 % (ref 0–1)
Eosinophils Absolute: 0.5 10*3/uL (ref 0.0–0.7)
Eosinophils Relative: 5 % (ref 0–5)
HCT: 29.9 % — ABNORMAL LOW (ref 36.0–46.0)
Hemoglobin: 9.6 g/dL — ABNORMAL LOW (ref 12.0–15.0)
Lymphocytes Relative: 22 % (ref 12–46)
Lymphs Abs: 2.2 10*3/uL (ref 0.7–4.0)
MCH: 29.6 pg (ref 26.0–34.0)
MCHC: 32.1 g/dL (ref 30.0–36.0)
MCV: 92.3 fL (ref 78.0–100.0)
Monocytes Absolute: 1 10*3/uL (ref 0.1–1.0)
Monocytes Relative: 11 % (ref 3–12)
Neutro Abs: 6 10*3/uL (ref 1.7–7.7)
Neutrophils Relative %: 61 % (ref 43–77)
Platelets: 234 10*3/uL (ref 150–400)
RBC: 3.24 MIL/uL — ABNORMAL LOW (ref 3.87–5.11)
RDW: 13.5 % (ref 11.5–15.5)
WBC: 9.8 10*3/uL (ref 4.0–10.5)

## 2014-01-10 LAB — CBC
HEMATOCRIT: 28.8 % — AB (ref 36.0–46.0)
Hemoglobin: 9.2 g/dL — ABNORMAL LOW (ref 12.0–15.0)
MCH: 29.5 pg (ref 26.0–34.0)
MCHC: 31.9 g/dL (ref 30.0–36.0)
MCV: 92.3 fL (ref 78.0–100.0)
Platelets: 215 10*3/uL (ref 150–400)
RBC: 3.12 MIL/uL — ABNORMAL LOW (ref 3.87–5.11)
RDW: 13.6 % (ref 11.5–15.5)
WBC: 10.3 10*3/uL (ref 4.0–10.5)

## 2014-01-10 LAB — COMPREHENSIVE METABOLIC PANEL WITH GFR
ALT: 10 U/L (ref 0–35)
AST: 18 U/L (ref 0–37)
Albumin: 3.5 g/dL (ref 3.5–5.2)
Alkaline Phosphatase: 78 U/L (ref 39–117)
BUN: 22 mg/dL (ref 6–23)
CO2: 31 meq/L (ref 19–32)
Calcium: 9.4 mg/dL (ref 8.4–10.5)
Chloride: 98 meq/L (ref 96–112)
Creatinine, Ser: 0.65 mg/dL (ref 0.50–1.10)
GFR calc Af Amer: >90 mL/min
GFR calc non Af Amer: 82 mL/min — ABNORMAL LOW
Glucose, Bld: 162 mg/dL — ABNORMAL HIGH (ref 70–99)
Potassium: 3.7 meq/L (ref 3.7–5.3)
Sodium: 139 meq/L (ref 137–147)
Total Bilirubin: 0.2 mg/dL — ABNORMAL LOW (ref 0.3–1.2)
Total Protein: 7.1 g/dL (ref 6.0–8.3)

## 2014-01-10 LAB — POC OCCULT BLOOD, ED: Fecal Occult Bld: POSITIVE — AB

## 2014-01-10 LAB — TSH: TSH: 1.81 u[IU]/mL (ref 0.350–4.500)

## 2014-01-10 LAB — ABO/RH: ABO/RH(D): O POS

## 2014-01-10 MED ORDER — ALBUTEROL SULFATE (2.5 MG/3ML) 0.083% IN NEBU: 3.0000 mL | INHALATION_SOLUTION | Freq: Four times a day (QID) | RESPIRATORY_TRACT | Status: DC | PRN

## 2014-01-10 MED ORDER — LEVOTHYROXINE SODIUM 150 MCG PO TABS
150.0000 ug | ORAL_TABLET | Freq: Every day | ORAL | Status: DC
  Administered 2014-01-11 – 2014-01-12 (×2): 150 ug via ORAL
  Filled 2014-01-10 (×3): qty 1

## 2014-01-10 MED ORDER — HYDROCODONE-ACETAMINOPHEN 5-325 MG PO TABS
1.0000 | ORAL_TABLET | Freq: Four times a day (QID) | ORAL | Status: DC | PRN
  Administered 2014-01-11: 1 via ORAL
  Filled 2014-01-10: qty 1

## 2014-01-10 MED ORDER — SODIUM CHLORIDE 0.9 % IJ SOLN: 3.0000 mL | Freq: Two times a day (BID) | INTRAMUSCULAR | Status: DC

## 2014-01-10 MED ORDER — ZOLPIDEM TARTRATE 5 MG PO TABS
2.5000 mg | ORAL_TABLET | Freq: Once | ORAL | Status: AC
  Administered 2014-01-10: 2.5 mg via ORAL
  Filled 2014-01-10: qty 1

## 2014-01-10 MED ORDER — TIOTROPIUM BROMIDE MONOHYDRATE 18 MCG IN CAPS
18.0000 ug | ORAL_CAPSULE | Freq: Every day | RESPIRATORY_TRACT | Status: DC
  Administered 2014-01-10 – 2014-01-12 (×2): 18 ug via RESPIRATORY_TRACT
  Filled 2014-01-10: qty 5

## 2014-01-10 MED ORDER — SODIUM CHLORIDE 0.9 % IV SOLN
INTRAVENOUS | Status: DC
  Administered 2014-01-10 – 2014-01-12 (×4): via INTRAVENOUS

## 2014-01-10 MED ORDER — PANTOPRAZOLE SODIUM 40 MG IV SOLR
40.0000 mg | Freq: Every day | INTRAVENOUS | Status: DC
  Administered 2014-01-10 – 2014-01-11 (×2): 40 mg via INTRAVENOUS
  Filled 2014-01-10 (×3): qty 40

## 2014-01-10 MED ORDER — ATORVASTATIN CALCIUM 20 MG PO TABS
20.0000 mg | ORAL_TABLET | Freq: Every day | ORAL | Status: DC
  Administered 2014-01-11 – 2014-01-12 (×2): 20 mg via ORAL
  Filled 2014-01-10 (×2): qty 1

## 2014-01-10 MED ORDER — BUDESONIDE-FORMOTEROL FUMARATE 160-4.5 MCG/ACT IN AERO
2.0000 | INHALATION_SPRAY | Freq: Two times a day (BID) | RESPIRATORY_TRACT | Status: DC
  Administered 2014-01-10 – 2014-01-12 (×4): 2 via RESPIRATORY_TRACT
  Filled 2014-01-10: qty 6

## 2014-01-10 NOTE — Telephone Encounter (Signed)
Niece- [name] called stating that pt is not feeling well. Usually after she gets the feraheme she can see an improvement. This am she see frank blood in the stool. She is weak and short of breath and requested she be seen here. Per Dr. Juliann Mule we can draw labs but she needs to have her bleeding addressed. She has history of colonic AV malformations and he probably needs to be scoped. Melissa states that they only saw Dr. Olevia Perches in the hospital. She feels pt needs to go to the ED to be seen. She is going to take her to [facility]. Dr. Juliann Mule is aware.

## 2014-01-10 NOTE — ED Notes (Signed)
Per pt/jfamily-history of rectal bleeding-started having rectal bleeding yesterday with loose stool X 2-feels weak

## 2014-01-10 NOTE — ED Provider Notes (Signed)
CSN: [id number]     Arrival date & time 01/10/14  1346 History   First MD Initiated Contact with Patient 01/10/14 1505     Chief Complaint  Patient presents with  . Rectal Bleeding     (Consider location/radiation/quality/duration/timing/severity/associated sxs/prior Treatment) HPI Comments: Patient with history of anemia associated with colonic AVM.  Patient noted bright red blood in diarrheal stools X 2 yesterday.  No episodes today.  Per review of chart, patient received ferumoxytol per her hemoncologist on January 06, 2014 . Over the last several days has noted increased light-headedness with position change and exertional dyspnea.  Patient has not had to receive a blood transfusion for several years.  Patient is a 79 y.o. female presenting with hematochezia. The history is provided by the patient, medical records and a significant other. No language interpreter was used.  Rectal Bleeding Quality:  Bright red Amount:  Moderate Duration:  2 days Timing:  Sporadic Progression:  Improving Chronicity:  Recurrent Context: diarrhea   Similar prior episodes: yes   Associated symptoms: light-headedness   Associated symptoms: no abdominal pain, no loss of consciousness and no vomiting   Risk factors comment:  Colonic AVM   Past Medical History  Diagnosis Date  . COPD (chronic obstructive pulmonary disease)   . Hypertrophic obstructive cardiomyopathy     Echo 10/10 EF 65-70% SW 1.6 PW 1.4 mild SAM no LVOT gradient at rest. Valsalva not performed Mild MR. No hyperenhance ment on MRI EF 69%.  . Coronary atherosclerosis of native coronary artery     Cath 1/11: RCA 50-60 ostial o/w normal. EF 70%.   . Pulmonary hypertension     Cath 1/11 RA 8, RV 43/6/14, PA 42/17 (29) PCWP 15 Fick  5.0/2.6. PVR 2.8    . Edema   . Occlusion and stenosis of carotid artery     s/p L CEA u/s 3/11. R 60-79%; L 40-59%. no change since 3/10  . Peripheral vascular disease, unspecified   . Hypertension   .  Emphysema   . Asthma   . Hypothyroidism   . Allergic rhinitis   . Gastric AVM   . Heart murmur   . Anginal pain     "pressure"  . Emphysema   . Oxygen dependent 04/23/2012    "concentrated at night; liquid during day"  . Dyspnea 04/23/2012    "all the time"  . Anemia 04/23/2012    "severe @ times; think caused by colonic AVMs"  . History of blood transfusion     "many times"  . H/O hiatal hernia   . Stroke ~ 2004    denies residual on 04/23/2012  . Arthritis     "probably"  . Breast cancer   . Pneumonia    Past Surgical History  Procedure Laterality Date  . Carotid endarterectomy  2005    left  . Thyroidectomy  2007  . Appendectomy  1950  . Breast lumpectomy  2002    right-hx of radiation  . Dilation and curettage of uterus  1950's  . Cataract extraction w/ intraocular lens implant  02/2011    right  . Excisional hemorrhoidectomy  1950's  . Cardiac catheterization     Family History  Problem Relation Age of Onset  . Emphysema Father   . COPD Sister   . Hypertension Sister     Pulmonary  . Emphysema Sister   . Asthma Sister   . Asthma Sister   . Emphysema Brother     x2  .  Colon cancer Brother   . Colon cancer Mother   . Stroke Mother    History  Substance Use Topics  . Smoking status: Former Smoker -- 1.00 packs/day for 30 years    Types: Cigarettes    Quit date: 09/16/1997  . Smokeless tobacco: Never Used  . Alcohol Use: No   OB History   Grav Para Term Preterm Abortions TAB SAB Ect Mult Living                 Review of Systems  Constitutional: Positive for fatigue.  Respiratory: Positive for shortness of breath.   Cardiovascular: Negative for chest pain.  Gastrointestinal: Positive for diarrhea, blood in stool and hematochezia. Negative for nausea, vomiting and abdominal pain.  Neurological: Positive for light-headedness. Negative for loss of consciousness.  All other systems reviewed and are negative.     Allergies  Latex  Home Medications    Prior to Admission medications   Medication Sig Start Date End Date Taking? Authorizing Provider  albuterol (PROVENTIL HFA;VENTOLIN HFA) 108 (90 BASE) MCG/ACT inhaler Inhale 2 puffs into the lungs every 6 (six) hours as needed for wheezing or shortness of breath.   Yes Historical Provider, MD  aspirin 81 MG EC tablet Take 81 mg by mouth daily.     Yes Historical Provider, MD  atorvastatin (LIPITOR) 20 MG tablet Take 20 mg by mouth daily.     Yes Historical Provider, MD  budesonide-formoterol (SYMBICORT) 160-4.5 MCG/ACT inhaler Inhale 2 puffs into the lungs 2 (two) times daily.   Yes Historical Provider, MD  Calcium Carbonate (CALCIUM 600) 1500 MG TABS Take 1 tablet by mouth daily.     Yes Historical Provider, MD  Cholecalciferol (VITAMIN D-3 PO) Take 1,000 Units by mouth daily.    Yes Historical Provider, MD  furosemide (LASIX) 20 MG tablet Take 20 mg by mouth daily.     Yes Historical Provider, MD  HYDROcodone-acetaminophen (NORCO/VICODIN) 5-325 MG per tablet Take 1 tablet by mouth every 6 (six) hours as needed for moderate pain.   Yes Historical Provider, MD  levothyroxine (SYNTHROID, LEVOTHROID) 50 MCG tablet Take 150 mcg by mouth daily.    Yes Historical Provider, MD  Multiple Vitamins-Minerals (ONE-A-DAY EXTRAS ANTIOXIDANT) CAPS Take 1 capsule by mouth daily.     Yes Historical Provider, MD  omeprazole (PRILOSEC) 20 MG capsule Take 20 mg by mouth daily.   Yes Historical Provider, MD  Pseudoephedrine HCl (SUDAFED 24 HOUR PO) Take 1 tablet by mouth daily.   Yes Historical Provider, MD  tiotropium (SPIRIVA) 18 MCG inhalation capsule Place 18 mcg into inhaler and inhale daily.   Yes Historical Provider, MD  tolterodine (DETROL LA) 2 MG 24 hr capsule Take 2 mg by mouth daily.   Yes Historical Provider, MD   BP 112/57  Pulse 106  Temp(Src) 98.2 F (36.8 C) (Oral)  Resp 20  SpO2 95% Physical Exam  Nursing note and vitals reviewed. Constitutional: She is oriented to person, place, and time.  She appears well-developed and well-nourished.  HENT:  Head: Normocephalic and atraumatic.  Eyes: Pupils are equal, round, and reactive to light.  Neck: Normal range of motion.  Cardiovascular: Regular rhythm and intact distal pulses.   Pulmonary/Chest: Effort normal. She has no wheezes.  Abdominal: Soft. Bowel sounds are normal. She exhibits no distension. There is no tenderness.  Lymphadenopathy:    She has no cervical adenopathy.  Neurological: She is alert and oriented to person, place, and time.  Skin: Skin is  warm and dry.  Psychiatric: She has a normal mood and affect. Her behavior is normal. Judgment and thought content normal.    ED Course  Procedures (including critical care time) Labs Review Labs Reviewed  CBC WITH DIFFERENTIAL - Abnormal; Notable for the following:    RBC 3.24 (*)    Hemoglobin 9.6 (*)    HCT 29.9 (*)    All other components within normal limits  COMPREHENSIVE METABOLIC PANEL - Abnormal; Notable for the following:    Glucose, Bld 162 (*)    Total Bilirubin 0.2 (*)    GFR calc non Af Amer 82 (*)    All other components within normal limits  OCCULT BLOOD X 1 CARD TO LAB, STOOL    Imaging Review No results found.   EKG Interpretation None     Discussed patient with Dr. Wilson Singer.  Heme positive stool, drop in hemoglobin and hematocrit.  Consult with [facility], admitted to hospitalist. MDM   Final diagnoses:  None    GI bleed.    Norman Herrlich, NP 01/11/14 0100

## 2014-01-10 NOTE — ED Notes (Signed)
Pt states yesterday she had 2 BM's w/ bright red blood in with the stool, states has not had any today and denies pain, states has a hx of low hgb and hx of having to get blood transfusions, pt states at times she feels lightheaded.

## 2014-01-10 NOTE — H&P (Signed)
History and Physical    [name] QQP:[id number] DOB: April 29, 1933 DOA: 01/10/2014  Referring physician: Dr. Wilson Singer PCP: Evalee Mutton, CCT  Specialists: [facility]  Chief Complaint: bloody BMs  HPI: [name] is a 80 y.o. female has a past medical history significant for COPD, disease, hypertension, hypothyroidism, HTN, pulmonary hypertension, history of GI bleeding due to colonic AVMs, iron deficiency anemia, presented to the ED with a chief complaint of 2 diarrhea episodes yesterday with bright red blood; states it was a large amount. She also endorses lightheadedness and dizziness on standing up. She denies any chest pain, denies any breathing difficulties. She is on 3 L oxygen at home at baseline. She has no fever or chills. She denies any cough or chest congestion. She denies any abdominal pain, has no nausea or vomiting. She has no fever or chills. In the emergency room, CBC showed a 9 which is a 5 point decrease from previous hemoglobin of 14 in February.  Review of Systems: As per history of present illness, otherwise negative  Past Medical History  Diagnosis Date  . COPD (chronic obstructive pulmonary disease)   . Hypertrophic obstructive cardiomyopathy     Echo 10/10 EF 65-70% SW 1.6 PW 1.4 mild SAM no LVOT gradient at rest. Valsalva not performed Mild MR. No hyperenhance ment on MRI EF 69%.  . Coronary atherosclerosis of native coronary artery     Cath 1/11: RCA 50-60 ostial o/w normal. EF 70%.   . Pulmonary hypertension     Cath 1/11 RA 8, RV 43/6/14, PA 42/17 (29) PCWP 15 Fick  5.0/2.6. PVR 2.8    . Edema   . Occlusion and stenosis of carotid artery     s/p L CEA u/s 3/11. R 60-79%; L 40-59%. no change since 3/10  . Peripheral vascular disease, unspecified   . Hypertension   . Emphysema   . Asthma   . Hypothyroidism   . Allergic rhinitis   . Gastric AVM   . Heart murmur   . Anginal pain     "pressure"  . Emphysema   . Oxygen dependent 04/23/2012   "concentrated at night; liquid during day"  . Dyspnea 04/23/2012    "all the time"  . Anemia 04/23/2012    "severe @ times; think caused by colonic AVMs"  . History of blood transfusion     "many times"  . H/O hiatal hernia   . Stroke ~ 2004    denies residual on 04/23/2012  . Arthritis     "probably"  . Breast cancer   . Pneumonia    Past Surgical History  Procedure Laterality Date  . Carotid endarterectomy  2005    left  . Thyroidectomy  2007  . Appendectomy  1950  . Breast lumpectomy  2002    right-hx of radiation  . Dilation and curettage of uterus  1950's  . Cataract extraction w/ intraocular lens implant  02/2011    right  . Excisional hemorrhoidectomy  1950's  . Cardiac catheterization     Social History:  reports that she quit smoking about 16 years ago. Her smoking use included Cigarettes. She has a 30 pack-year smoking history. She has never used smokeless tobacco. She reports that she does not drink alcohol or use illicit drugs.  Allergies  Allergen Reactions  . Latex Hives and Itching    Family History  Problem Relation Age of Onset  . Emphysema Father   . COPD Sister   . Hypertension Sister  Pulmonary  . Emphysema Sister   . Asthma Sister   . Asthma Sister   . Emphysema Brother     x2  . Colon cancer Brother   . Colon cancer Mother   . Stroke Mother    Prior to Admission medications   Medication Sig Start Date End Date Taking? Authorizing Provider  albuterol (PROVENTIL HFA;VENTOLIN HFA) 108 (90 BASE) MCG/ACT inhaler Inhale 2 puffs into the lungs every 6 (six) hours as needed for wheezing or shortness of breath.   Yes Historical Provider, MD  aspirin 81 MG EC tablet Take 81 mg by mouth daily.     Yes Historical Provider, MD  atorvastatin (LIPITOR) 20 MG tablet Take 20 mg by mouth daily.     Yes Historical Provider, MD  budesonide-formoterol (SYMBICORT) 160-4.5 MCG/ACT inhaler Inhale 2 puffs into the lungs 2 (two) times daily.   Yes Historical Provider,  MD  Calcium Carbonate (CALCIUM 600) 1500 MG TABS Take 1 tablet by mouth daily.     Yes Historical Provider, MD  Cholecalciferol (VITAMIN D-3 PO) Take 1,000 Units by mouth daily.    Yes Historical Provider, MD  furosemide (LASIX) 20 MG tablet Take 20 mg by mouth daily.     Yes Historical Provider, MD  HYDROcodone-acetaminophen (NORCO/VICODIN) 5-325 MG per tablet Take 1 tablet by mouth every 6 (six) hours as needed for moderate pain.   Yes Historical Provider, MD  levothyroxine (SYNTHROID, LEVOTHROID) 50 MCG tablet Take 150 mcg by mouth daily.    Yes Historical Provider, MD  Multiple Vitamins-Minerals (ONE-A-DAY EXTRAS ANTIOXIDANT) CAPS Take 1 capsule by mouth daily.     Yes Historical Provider, MD  omeprazole (PRILOSEC) 20 MG capsule Take 20 mg by mouth daily.   Yes Historical Provider, MD  Pseudoephedrine HCl (SUDAFED 24 HOUR PO) Take 1 tablet by mouth daily.   Yes Historical Provider, MD  tiotropium (SPIRIVA) 18 MCG inhalation capsule Place 18 mcg into inhaler and inhale daily.   Yes Historical Provider, MD  tolterodine (DETROL LA) 2 MG 24 hr capsule Take 2 mg by mouth daily.   Yes Historical Provider, MD   Physical Exam: Filed Vitals:   01/10/14 1624 01/10/14 1625 01/10/14 1627 01/10/14 1700  BP: 141/66 117/54 113/54   Pulse: 84 96 99 88  Temp:      TempSrc:      Resp: 20 20 20 25   SpO2: 99% 99% 98% 99%     General:  No apparent distress, pleasant Caucasian female  Eyes: no scleral icterus  ENT: moist oropharynx  Neck: supple, no JVD  Cardiovascular: regular rate 3/6 SEM; 2+ peripheral pulses  Respiratory: Overall decreased breath sounds, minimal wheezing,  Abdomen: soft, non tender to palpation, positive bowel sounds, no guarding, no rebound  Skin: no rashes  Musculoskeletal: Trace peripheral edema  Psychiatric: normal mood and affect  Neurologic: Grossly nonfocal  Labs on Admission:  Basic Metabolic Panel:  Recent Labs Lab 01/10/14 1439  NA 139  K 3.7  CL 98    CO2 31  GLUCOSE 162*  BUN 22  CREATININE 0.65  CALCIUM 9.4   Liver Function Tests:  Recent Labs Lab 01/10/14 1439  AST 18  ALT 10  ALKPHOS 78  BILITOT 0.2*  PROT 7.1  ALBUMIN 3.5   CBC:  Recent Labs Lab 01/10/14 1439  WBC 9.8  NEUTROABS 6.0  HGB 9.6*  HCT 29.9*  MCV 92.3  PLT 234   Radiological Exams on Admission: No results found.  EKG: Independently reviewed.  Assessment/Plan Principal Problem:   GI bleed Active Problems:   HYPOTHYROIDISM   HYPERTENSION   CAD, NATIVE VESSEL   PULMONARY HYPERTENSION, SECONDARY   CAROTID ARTERY DISEASE   COPD   GERD (gastroesophageal reflux disease)   Acute blood loss anemia   GI bleed - patient with bloody was yesterday. She hasn't had any bowel movements since. She did have a 5 point drop in her hemoglobin compared to 2 months ago. Gastroenterology has been consulted, appreciate input. She was seen by Dr. Olevia Perches last she was in the hospital, and patient would like to be seen by [facility] gastroenterology group. Admit patient to telemetry, monitor H&H every 6 hours.  COPD - patient appears compensated at this point and is on baseline oxygen. She has minimal wheezing on the left upper lung field, will obtain a chest x-ray. We'll continue her home inhalers. She used to use prednisone in the past but she is off of it right now.  Coronary artery disease - continue home medications - Hold Lasix as she is getting mild hydration because of GI bleed  Hypothyroidism - continue Synthroid. Last TSH was checked in 2013, will recheck.  Acute blood loss anemia - due to #1. There is no need for transfusion right now. She has a history of chronic anemia and has been receiving iron infusions in oncology clinic.  Hyperlipidemia - continue statin  Diet: N.p.o. Fluids: Normal saline at 50 cc per hour DVT Prophylaxis: SCDs  Code Status: Partial code. She wants everything but intubation.  Family Communication: niece at the bedside   Disposition Plan: Admit to inpatient  Time spent: 36  This note has been created with Surveyor, quantity. Any transcriptional errors are unintentional.   Davante Gerke M. Cruzita Lederer, MD [facility] Pager [phone]  If 7PM-7AM, please contact night-coverage [email or web address] Password [facility] 01/10/2014, 5:26 PM

## 2014-01-11 DIAGNOSIS — D649 Anemia, unspecified: Secondary | ICD-10-CM

## 2014-01-11 DIAGNOSIS — I10 Essential (primary) hypertension: Secondary | ICD-10-CM

## 2014-01-11 DIAGNOSIS — I6789 Other cerebrovascular disease: Secondary | ICD-10-CM

## 2014-01-11 DIAGNOSIS — K922 Gastrointestinal hemorrhage, unspecified: Secondary | ICD-10-CM

## 2014-01-11 LAB — CBC
HCT: 26.8 % — ABNORMAL LOW (ref 36.0–46.0)
HEMATOCRIT: 27.5 % — AB (ref 36.0–46.0)
HEMOGLOBIN: 8.5 g/dL — AB (ref 12.0–15.0)
Hemoglobin: 8.7 g/dL — ABNORMAL LOW (ref 12.0–15.0)
MCH: 29.1 pg (ref 26.0–34.0)
MCH: 29.9 pg (ref 26.0–34.0)
MCHC: 30.9 g/dL (ref 30.0–36.0)
MCHC: 32.5 g/dL (ref 30.0–36.0)
MCV: 94.2 fL (ref 78.0–100.0)
MCV: 92.1 fL (ref 78.0–100.0)
PLATELETS: 213 10*3/uL (ref 150–400)
Platelets: 201 10*3/uL (ref 150–400)
RBC: 2.92 MIL/uL — ABNORMAL LOW (ref 3.87–5.11)
RBC: 2.91 MIL/uL — ABNORMAL LOW (ref 3.87–5.11)
RDW: 13.5 % (ref 11.5–15.5)
RDW: 13.6 % (ref 11.5–15.5)
WBC: 8.6 10*3/uL (ref 4.0–10.5)
WBC: 10.4 10*3/uL (ref 4.0–10.5)

## 2014-01-11 LAB — TYPE AND SCREEN
ABO/RH(D): O POS
Antibody Screen: NEGATIVE

## 2014-01-11 LAB — BASIC METABOLIC PANEL
BUN: 19 mg/dL (ref 6–23)
CHLORIDE: 103 meq/L (ref 96–112)
CO2: 31 meq/L (ref 19–32)
Calcium: 8.6 mg/dL (ref 8.4–10.5)
Creatinine, Ser: 0.61 mg/dL (ref 0.50–1.10)
GFR calc non Af Amer: 84 mL/min — ABNORMAL LOW (ref >90)
Glucose, Bld: 116 mg/dL — ABNORMAL HIGH (ref 70–99)
Potassium: 3.9 mEq/L (ref 3.7–5.3)
Sodium: 142 mEq/L (ref 137–147)

## 2014-01-11 LAB — OCCULT BLOOD X 1 CARD TO LAB, STOOL: Fecal Occult Bld: POSITIVE — AB

## 2014-01-11 MED ORDER — POLYETHYLENE GLYCOL 3350 17 GM/SCOOP PO POWD
0.5000 | Freq: Once | ORAL | Status: AC
  Administered 2014-01-11: 0.5 via ORAL
  Filled 2014-01-11: qty 255

## 2014-01-11 MED ORDER — ONDANSETRON HCL 4 MG/2ML IJ SOLN
4.0000 mg | Freq: Four times a day (QID) | INTRAMUSCULAR | Status: DC | PRN
  Administered 2014-01-11 – 2014-01-12 (×2): 4 mg via INTRAVENOUS
  Filled 2014-01-11: qty 2

## 2014-01-11 MED ORDER — ONDANSETRON 4 MG PO TBDP
4.0000 mg | ORAL_TABLET | Freq: Three times a day (TID) | ORAL | Status: DC | PRN
  Administered 2014-01-11: 4 mg via ORAL
  Filled 2014-01-11: qty 1

## 2014-01-11 MED ORDER — BISACODYL 5 MG PO TBEC
20.0000 mg | DELAYED_RELEASE_TABLET | Freq: Once | ORAL | Status: AC
  Administered 2014-01-11: 20 mg via ORAL
  Filled 2014-01-11: qty 4

## 2014-01-11 MED ORDER — METOCLOPRAMIDE HCL 5 MG/ML IJ SOLN
10.0000 mg | Freq: Once | INTRAMUSCULAR | Status: AC
  Administered 2014-01-11: 10 mg via INTRAVENOUS
  Filled 2014-01-11: qty 2

## 2014-01-11 NOTE — Progress Notes (Signed)
PROGRESS NOTE  [name] KGM:[id number] DOB: 31-Jan-1933 DOA: 01/10/2014 PCP: Evalee Mutton, CCT  HPI: [name] is a 80 y.o. female has a past medical history significant for COPD, disease, hypertension, hypothyroidism, HTN, pulmonary hypertension, history of GI bleeding due to colonic AVMs, iron deficiency anemia, presented to the ED with a chief complaint of 2 diarrhea episodes yesterday with bright red blood  Assessment/Plan: GI bleed - GI consulted, appreciate input, plan for colonoscopy tomorrow. Clinically patient appears to have stopped bleeding.  - monitor CBC  COPD - patient appears compensated at this point and is on baseline oxygen. CXR without acute processes.   Coronary artery disease - continue home medications   Hypothyroidism - continue Synthroid. Last TSH was checked in 2013, rechecked and within normal limits.   Acute blood loss anemia - due to #1. There is no need for transfusion right now. She has a history of chronic anemia and has been receiving iron infusions in oncology clinic.   Hyperlipidemia - continue statin   Diet: Clear liquid Fluids: Normal saline DVT Prophylaxis: SCDs  Code Status: Partial code, no intubation Family Communication: Discussed with patient  Disposition Plan: Home when ready  Consultants:  Gastroenterology  Procedures:  None   Antibiotics None  HPI/Subjective: Patient is feeling well this morning, no chest pain, no shortness or breath, no bloody bowel movements overnight.  Objective: Filed Vitals:   01/10/14 1835 01/10/14 2145 01/11/14 0530 01/11/14 0859  BP: 136/58 128/49 98/45   Pulse: 95 97 84   Temp: 98.3 F (36.8 C) 98.1 F (36.7 C) 98.5 F (36.9 C)   TempSrc: Oral Oral Oral   Resp: 20 22 20    Height: 5\' 6"  (1.676 m)     Weight: 73.936 kg (163 lb)     SpO2: 98% 100% 99% 95%    Intake/Output Summary (Last 24 hours) at 01/11/14 1150 Last data filed at 01/11/14 0700  Gross per 24 hour  Intake  679.17 ml  Output      0 ml  Net 679.17 ml   Filed Weights   01/10/14 1835  Weight: 73.936 kg (163 lb)   Exam:  General:  NAD  Cardiovascular: regular rate and rhythm, 3/6 SEM  Respiratory: decreased breath sounds, no wheezing  Abdomen: soft, not tender to palpation, positive bowel sounds  MSK: no peripheral edema  Neuro: non focal  Data Reviewed: Basic Metabolic Panel:  Recent Labs Lab 01/10/14 1439 01/11/14 0450  NA 139 142  K 3.7 3.9  CL 98 103  CO2 31 31  GLUCOSE 162* 116*  BUN 22 19  CREATININE 0.65 0.61  CALCIUM 9.4 8.6   Liver Function Tests:  Recent Labs Lab 01/10/14 1439  AST 18  ALT 10  ALKPHOS 78  BILITOT 0.2*  PROT 7.1  ALBUMIN 3.5   CBC:  Recent Labs Lab 01/10/14 1439 01/10/14 2235 01/11/14 0450  WBC 9.8 10.3 8.6  NEUTROABS 6.0  --   --   HGB 9.6* 9.2* 8.5*  HCT 29.9* 28.8* 27.5*  MCV 92.3 92.3 94.2  PLT 234 215 201   Studies: Dg Chest Port 1 View  01/10/2014   CLINICAL DATA:  COPD. Bloody bowel movements. Hypertension. Asthma.  EXAM: PORTABLE CHEST - 1 VIEW  COMPARISON:  04/23/2012  FINDINGS: Numerous leads and wires project over the chest. Midline trachea. Normal heart size with a tortuous, atherosclerotic aorta. No pleural effusion or pneumothorax. Bullous disease at the apices, greater on the right. Lower lobe predominant interstitial  thickening which is moderate and not significantly changed. No lobar consolidation. No congestive failure.  IMPRESSION: COPD/chronic bronchitis, without superimposed acute process.   Electronically Signed   By: Abigail Miyamoto M.D.   On: 01/10/2014 18:28    Scheduled Meds: . atorvastatin  20 mg Oral Daily  . budesonide-formoterol  2 puff Inhalation BID  . levothyroxine  150 mcg Oral QAC breakfast  . metoCLOPramide (REGLAN) injection  10 mg Intravenous Once  . metoCLOPramide (REGLAN) injection  10 mg Intravenous Once  . pantoprazole (PROTONIX) IV  40 mg Intravenous QHS  . polyethylene glycol powder   0.5 Container Oral Once  . polyethylene glycol powder  0.5 Container Oral Once  . sodium chloride  3 mL Intravenous Q12H  . tiotropium  18 mcg Inhalation Daily   Continuous Infusions: . sodium chloride 50 mL/hr at 01/11/14 1112    Principal Problem:   GI bleed Active Problems:   HYPOTHYROIDISM   HYPERTENSION   CAD, NATIVE VESSEL   PULMONARY HYPERTENSION, SECONDARY   CAROTID ARTERY DISEASE   COPD   GERD (gastroesophageal reflux disease)   Acute blood loss anemia   Time spent: 35  This note has been created with Surveyor, quantity. Any transcriptional errors are unintentional.   Marzetta Board, MD [facility] Pager [phone]. If 7 PM - 7 AM, please contact night-coverage at [email or web address], password [facility] 01/11/2014, 11:50 AM  LOS: 1 day

## 2014-01-11 NOTE — Care Management Note (Addendum)
    Page 1 of 1   01/11/2014     3:22:00 PM CARE MANAGEMENT NOTE 01/11/2014  Patient:  MARLETTA, BOUSQUET   Account Number:  [id number]  Date Initiated:  01/11/2014  Documentation initiated by:  Dessa Phi  Subjective/Objective Assessment:   80 Y/O F ADMITTED W/LOWER GIB.MS:XJDB     Action/Plan:   FROM HOME.HOME 02.HAS PCP,PHARMACY.   Anticipated DC Date:  01/13/2014   Anticipated DC Plan:  [place]  CM consult      Choice offered to / List presented to:             Status of service:  In process, will continue to follow Medicare Important Message given?  YES (If response is "NO", the following Medicare IM given date fields will be blank) Date Medicare IM given:   Date Additional Medicare IM given:    Discharge Disposition:    Per UR Regulation:  Reviewed for med. necessity/level of care/duration of stay  If discussed at Long Length of Stay Meetings, dates discussed:    Comments:  01/11/14 Sheilyn Boehlke RN,BSN NCM [phone] GI FOLLOWING.?COLONOSCOPY.PATIENT STATES SHE DOES NOT HAVE A TRAVEL TANK-ALL EMPTY.TC LINCARE SPOKE TO ANITA @ [place] TODAY SINCE LIKELY TO D/C TOMORROW IF MEDICALLY STABLE.

## 2014-01-11 NOTE — Consult Note (Signed)
Consultation  Referring Provider:  [facility]   Primary Care Physician:  Evalee Mutton, [place] Primary Gastroenterologist: Formerly seen in [place] and also in [place]. No local GI.        Reason for Consultation:   GI bleed           HPI:   [name] is a 81 y.o. female with multiple medical problems, including COPD on home oxygen. Patient admitted yesterday with rectal bleeding and severe anemia. She has a longstanding history of occult GI bleeding and has required numerous transfusions in the last 10+ years. She has apparently had numerous EGDs as well as small bowel video capsule study. Patient has been followed by hematology for the last few years and gets periodic iron infusions ( last one was last Thursday). Ten years ago patient had overt hematochezia and apparently found to have colonic AVMS [facility]).  No AVMs on last colonoscopy in 2012. She had not had any overt bleeding since being seen at [facility] 10 + years ago until this Sunday. On Sunday she had two episodes of painless hematochezia. No bleeding yesterday and no BMs /bleeding today.  Niece present and reports that  on 4/2 hgb was around 44 ( drawn at [facility]), it was 9.6 yesterday, down to 8.5 today after IVF. Patient takes a daily  ASA, no other blood thinning agents. She feels a little weak.     Past Medical History  Diagnosis Date  . COPD (chronic obstructive pulmonary disease)   . Hypertrophic obstructive cardiomyopathy     Echo 10/10 EF 65-70% SW 1.6 PW 1.4 mild SAM no LVOT gradient at rest. Valsalva not performed Mild MR. No hyperenhance ment on MRI EF 69%.  . Coronary atherosclerosis of native coronary artery     Cath 1/11: RCA 50-60 ostial o/w normal. EF 70%.   . Pulmonary hypertension     Cath 1/11 RA 8, RV 43/6/14, PA 42/17 (29) PCWP 15 Fick  5.0/2.6. PVR 2.8    . Edema   . Occlusion and stenosis of carotid artery     s/p L CEA u/s 3/11. R 60-79%; L 40-59%. no  change since 3/10  . Peripheral vascular disease, unspecified   . Hypertension   . Emphysema   . Asthma   . Hypothyroidism   . Allergic rhinitis   . Gastric AVM   . Heart murmur   . Anginal pain     "pressure"  . Emphysema   . Oxygen dependent 04/23/2012    "concentrated at night; liquid during day"  . Dyspnea 04/23/2012    "all the time"  . Anemia 04/23/2012    "severe @ times; think caused by colonic AVMs"  . History of blood transfusion     "many times"  . H/O hiatal hernia   . Stroke ~ 2004    denies residual on 04/23/2012  . Arthritis     "probably"  . Breast cancer   . Pneumonia     Past Surgical History  Procedure Laterality Date  . Carotid endarterectomy  2005    left  . Thyroidectomy  2007  . Appendectomy  1950  . Breast lumpectomy  2002    right-hx of radiation  . Dilation and curettage of uterus  1950's  . Cataract extraction w/ intraocular lens implant  02/2011    right  . Excisional hemorrhoidectomy  1950's  . Cardiac catheterization      Family History  Problem Relation Age of Onset  . Emphysema Father   . COPD Sister   . Hypertension Sister     Pulmonary  . Emphysema Sister   . Asthma Sister   . Asthma Sister   . Emphysema Brother     x2  . Colon cancer Brother   . Colon cancer Mother   . Stroke Mother      History  Substance Use Topics  . Smoking status: Former Smoker -- 1.00 packs/day for 30 years    Types: Cigarettes    Quit date: 09/16/1997  . Smokeless tobacco: Never Used  . Alcohol Use: No    Prior to Admission medications   Medication Sig Start Date End Date Taking? Authorizing Provider  albuterol (PROVENTIL HFA;VENTOLIN HFA) 108 (90 BASE) MCG/ACT inhaler Inhale 2 puffs into the lungs every 6 (six) hours as needed for wheezing or shortness of breath.   Yes Historical Provider, MD  aspirin 81 MG EC tablet Take 81 mg by mouth daily.     Yes Historical Provider, MD  atorvastatin (LIPITOR) 20 MG tablet Take 20 mg by mouth daily.      Yes Historical Provider, MD  budesonide-formoterol (SYMBICORT) 160-4.5 MCG/ACT inhaler Inhale 2 puffs into the lungs 2 (two) times daily.   Yes Historical Provider, MD  Calcium Carbonate (CALCIUM 600) 1500 MG TABS Take 1 tablet by mouth daily.     Yes Historical Provider, MD  Cholecalciferol (VITAMIN D-3 PO) Take 1,000 Units by mouth daily.    Yes Historical Provider, MD  furosemide (LASIX) 20 MG tablet Take 20 mg by mouth daily.     Yes Historical Provider, MD  HYDROcodone-acetaminophen (NORCO/VICODIN) 5-325 MG per tablet Take 1 tablet by mouth every 6 (six) hours as needed for moderate pain.   Yes Historical Provider, MD  levothyroxine (SYNTHROID, LEVOTHROID) 50 MCG tablet Take 150 mcg by mouth daily.    Yes Historical Provider, MD  Multiple Vitamins-Minerals (ONE-A-DAY EXTRAS ANTIOXIDANT) CAPS Take 1 capsule by mouth daily.     Yes Historical Provider, MD  omeprazole (PRILOSEC) 20 MG capsule Take 20 mg by mouth daily.   Yes Historical Provider, MD  Pseudoephedrine HCl (SUDAFED 24 HOUR PO) Take 1 tablet by mouth daily.   Yes Historical Provider, MD  tiotropium (SPIRIVA) 18 MCG inhalation capsule Place 18 mcg into inhaler and inhale daily.   Yes Historical Provider, MD  tolterodine (DETROL LA) 2 MG 24 hr capsule Take 2 mg by mouth daily.   Yes Historical Provider, MD    Current Facility-Administered Medications  Medication Dose Route Frequency Provider Last Rate Last Dose  . 0.9 %  sodium chloride infusion   Intravenous Continuous Victoria Griffins, MD 50 mL/hr at 01/11/14 0344    . albuterol (PROVENTIL) (2.5 MG/3ML) 0.083% nebulizer solution 3 mL  3 mL Inhalation Q6H PRN Victoria Karlyne Greenspan, MD      . atorvastatin (LIPITOR) tablet 20 mg  20 mg Oral Daily Victoria Karlyne Greenspan, MD      . budesonide-formoterol (SYMBICORT) 160-4.5 MCG/ACT inhaler 2 puff  2 puff Inhalation BID Victoria Griffins, MD   2 puff at 01/11/14 0858  . HYDROcodone-acetaminophen (NORCO/VICODIN) 5-325 MG per tablet 1 tablet  1 tablet  Oral Q6H PRN Victoria Karlyne Greenspan, MD      . levothyroxine (SYNTHROID, LEVOTHROID) tablet 150 mcg  150 mcg Oral QAC breakfast Victoria Griffins, MD   150 mcg at 01/11/14 0830  . pantoprazole (PROTONIX) injection 40 mg  40 mg Intravenous  QHS Victoria Griffins, MD   40 mg at 01/10/14 2224  . sodium chloride 0.9 % injection 3 mL  3 mL Intravenous Q12H Victoria Karlyne Greenspan, MD      . tiotropium [facility]) inhalation capsule 18 mcg  18 mcg Inhalation Daily Victoria Griffins, MD   18 mcg at 01/10/14 2134    Allergies as of 01/10/2014 - Review Complete 01/10/2014  Allergen Reaction Noted  . Latex Hives and Itching 12/11/2010    Review of Systems:    All systems reviewed and negative except where noted in HPI.   Physical Exam:  Vital signs in last 24 hours: Temp:  [98.1 F (36.7 C)-98.5 F (36.9 C)] 98.5 F (36.9 C) (04/28 0530) Pulse Rate:  [84-106] 84 (04/28 0530) Resp:  [20-26] 20 (04/28 0530) BP: (98-141)/(45-66) 98/45 mmHg (04/28 0530) SpO2:  [95 %-100 %] 95 % (04/28 0859) Weight:  [163 lb (73.936 kg)] 163 lb (73.936 kg) (04/27 1835) Last BM Date: 01/09/14 General:   Pleasant white female in NAD Head:  Normocephalic and atraumatic. Eyes:   No icterus.   Conjunctiva pink. Ears:  Normal auditory acuity. Neck:  Supple; no masses felt Lungs:  Respirations even and unlabored. Lungs clear to auscultation bilaterally.  No wheezes, crackles, or rhonchi.  Heart:  Regular rate and rhythm;  murmur heard. Abdomen:  Soft, nondistended, nontender. Normal bowel sounds. No appreciable masses or hepatomegaly.  Rectal:  No external lesions seen, no obvious hemorrhoids / fissures. Heme + in ED yesterday. Msk:  Symmetrical without gross deformities.  Extremities:  Without edema. Neurologic:  Alert and  oriented x4;  grossly normal neurologically. Skin:  Intact without significant lesions or rashes. Cervical Nodes:  No significant cervical adenopathy. Psych:  Alert and cooperative. Normal affect.  LAB  RESULTS:  Recent Labs  01/10/14 1439 01/10/14 2235 01/11/14 0450  WBC 9.8 10.3 8.6  HGB 9.6* 9.2* 8.5*  HCT 29.9* 28.8* 27.5*  PLT 234 215 201   BMET  Recent Labs  01/10/14 1439 01/11/14 0450  NA 139 142  K 3.7 3.9  CL 98 103  CO2 31 31  GLUCOSE 162* 116*  BUN 22 19  CREATININE 0.65 0.61  CALCIUM 9.4 8.6   LFT  Recent Labs  01/10/14 1439  PROT 7.1  ALBUMIN 3.5  AST 18  ALT 10  ALKPHOS 78  BILITOT 0.2*   STUDIES: Dg Chest Port 1 View  01/10/2014   CLINICAL DATA:  COPD. Bloody bowel movements. Hypertension. Asthma.  EXAM: PORTABLE CHEST - 1 VIEW  COMPARISON:  04/23/2012  FINDINGS: Numerous leads and wires project over the chest. Midline trachea. Normal heart size with a tortuous, atherosclerotic aorta. No pleural effusion or pneumothorax. Bullous disease at the apices, greater on the right. Lower lobe predominant interstitial thickening which is moderate and not significantly changed. No lobar consolidation. No congestive failure.  IMPRESSION: COPD/chronic bronchitis, without superimposed acute process.   Electronically Signed   By: Abigail Miyamoto M.D.   On: 01/10/2014 18:28    PREVIOUS ENDOSCOPIES:            EGDs / colonoscopies / Givens capsule - all done in [place] and [place]. Will locate reports.    Impression / Plan:   50. 80 year old female with painless hematochezia x2 on Sunday, none since. Bleeding associated with significant drop in hgb. This could be from recurrent colonic AVMs. Will locate last colonoscopy report, hopefully this will not need to be repeated.    2.  Acute on chronic normocytic anemia with drop of hgb from 14 to 9.6.  She has required numerous blood transfusions and iron infusions through the years. Followed by Hematology. Last iron infusion was last Thursday. Probably has chronic low grade GI blood loss from AVMs. Suspect she will need some blood prior to discharge.   3. Multiple medical problems as listed in PMH. On home 02.    Thanks   LOS: 1 day   Willia Craze  01/11/2014, 9:02 AM    [facility] Attending  I have also seen and assessed the patient and agree with the above note. Given her overt bleeding I think a diagnostic and possibly therapeutic colonoscopy make sense. The risks and benefits as well as alternatives of endoscopic procedure(s) have been discussed and reviewed. All questions answered. The patient agrees to proceed. Niece agrees also.  Gatha Mayer, MD, [facility] [phone] (pager) 01/11/2014 10:44 AM

## 2014-01-12 ENCOUNTER — Encounter (HOSPITAL_COMMUNITY): Payer: Self-pay | Admitting: *Deleted

## 2014-01-12 ENCOUNTER — Encounter (HOSPITAL_COMMUNITY)
Admission: EM | Disposition: A | Discharge status: Home / Self Care | Payer: Self-pay | Source: Home / Self Care | Attending: Internal Medicine

## 2014-01-12 DIAGNOSIS — I6789 Other cerebrovascular disease: Secondary | ICD-10-CM

## 2014-01-12 DIAGNOSIS — D62 Acute posthemorrhagic anemia: Secondary | ICD-10-CM

## 2014-01-12 DIAGNOSIS — J309 Allergic rhinitis, unspecified: Secondary | ICD-10-CM

## 2014-01-12 HISTORY — PX: COLONOSCOPY: SHX5424

## 2014-01-12 LAB — CBC
HEMATOCRIT: 28.7 % — AB (ref 36.0–46.0)
HEMOGLOBIN: 9.1 g/dL — AB (ref 12.0–15.0)
MCH: 29.4 pg (ref 26.0–34.0)
MCHC: 31.7 g/dL (ref 30.0–36.0)
MCV: 92.6 fL (ref 78.0–100.0)
Platelets: 233 10*3/uL (ref 150–400)
RBC: 3.1 MIL/uL — AB (ref 3.87–5.11)
RDW: 13.6 % (ref 11.5–15.5)
WBC: 8.5 10*3/uL (ref 4.0–10.5)

## 2014-01-12 SURGERY — COLONOSCOPY
Anesthesia: Moderate Sedation

## 2014-01-12 MED ORDER — FENTANYL CITRATE 0.05 MG/ML IJ SOLN
INTRAMUSCULAR | Status: AC
  Filled 2014-01-12: qty 2

## 2014-01-12 MED ORDER — MIDAZOLAM HCL 10 MG/2ML IJ SOLN
INTRAMUSCULAR | Status: DC | PRN
  Administered 2014-01-12: 1 mg via INTRAVENOUS
  Administered 2014-01-12 (×2): 2 mg via INTRAVENOUS

## 2014-01-12 MED ORDER — DIPHENHYDRAMINE HCL 50 MG/ML IJ SOLN
INTRAMUSCULAR | Status: AC
  Filled 2014-01-12: qty 1

## 2014-01-12 MED ORDER — ONDANSETRON HCL 4 MG/2ML IJ SOLN
INTRAMUSCULAR | Status: AC
  Filled 2014-01-12: qty 2

## 2014-01-12 MED ORDER — FENTANYL CITRATE 0.05 MG/ML IJ SOLN
INTRAMUSCULAR | Status: DC | PRN
  Administered 2014-01-12 (×2): 25 ug via INTRAVENOUS
  Administered 2014-01-12: 12.5 ug via INTRAVENOUS

## 2014-01-12 MED ORDER — MIDAZOLAM HCL 10 MG/2ML IJ SOLN
INTRAMUSCULAR | Status: AC
  Filled 2014-01-12: qty 2

## 2014-01-12 NOTE — Clinical Documentation Improvement (Signed)
Possible Clinical Conditions?  Acute Respiratory Failure Acute on Chronic Respiratory Failure Chronic Respiratory Failure Other Condition Cannot Clinically Determine   Supporting Information:(As per notes) "COPD on home oxygen", "Oxygen dependent"  Thank You, Alessandra Grout, RN, BSN, CCDS, Clinical Documentation Specialist:  [phone]   [phone]=Cell [place]- Health Information Management

## 2014-01-12 NOTE — Op Note (Signed)
[facility] [place], [address]   COLONOSCOPY PROCEDURE REPORT  PATIENT: Victoria, Larson  MR#: [id number] BIRTHDATE: Jun 19, 1933 , 73  yrs. old GENDER: Female ENDOSCOPIST: Gatha Mayer, MD, [facility] REFERRED BY:   Hospitalist PROCEDURE DATE:  01/12/2014 PROCEDURE:   Colonoscopy, diagnostic    incomplete ASA CLASS:   Class III INDICATIONS:hematochezia/GI bleed. MEDICATIONS: Fentanyl 62.5 mcg IV and Versed 5 mg IV  DESCRIPTION OF PROCEDURE:   After the risks benefits and alternatives of the procedure were thoroughly explained, informed consent was obtained.  A digital rectal exam revealed no abnormalities of the rectum.   The Pentax Adult Colonscope [id number] endoscope was introduced through the anus and advanced to the mid transverse colon. No adverse events experienced.   The quality of the prep was adequate, using MiraLax  The instrument was then slowly withdrawn as the colon was fully examined.      COLON FINDINGS: Severe diverticulosis was noted in the sigmoid colon.   The colon mucosa was otherwise normal to transverse colon. Redundant colon - unable to get to cecum despite multiple positions and multiple pressure points used. Retroflexed views revealed no abnormalities.  The scope was withdrawn and the procedure completed. COMPLICATIONS: There were no complications.  ENDOSCOPIC IMPRESSION: 1.   Severe diverticulosis was noted in the sigmoid colon - This may have been cause of recent hematochezia 2.   The colon mucosa was otherwise normal to transverse colon. Colon very redundant - could not reach cecum.  RECOMMENDATIONS: Supportive care Home today/tomorrow. F/U GI prn   eSigned:  Gatha Mayer, MD, [facility] 01/12/2014 9:57 AM

## 2014-01-12 NOTE — Discharge Instructions (Signed)
°  Diverticulosis Diverticulosis is a common condition that develops when small pouches (diverticula) form in the wall of the colon. The risk of diverticulosis increases with age. It happens more often in people who eat a low-fiber diet. Most individuals with diverticulosis have no symptoms. Those individuals with symptoms usually experience abdominal pain, constipation, or loose stools (diarrhea). HOME CARE INSTRUCTIONS   Increase the amount of fiber in your diet as directed by your caregiver or dietician. This may reduce symptoms of diverticulosis.  Your caregiver may recommend taking a dietary fiber supplement.(flaxseed)  Drink at least 6 to 8 glasses of water each day to prevent constipation.  Try not to strain when you have a bowel movement.  Your caregiver may recommend avoiding nuts and seeds to prevent complications, although this is still an uncertain benefit.  Only take over-the-counter or prescription medicines for pain, discomfort, or fever as directed by your caregiver. FOODS WITH HIGH FIBER CONTENT INCLUDE:  Fruits. Apple, peach, pear, tangerine, raisins, prunes.  Vegetables. Brussels sprouts, asparagus, broccoli, cabbage, carrot, cauliflower, romaine lettuce, spinach, summer squash, tomato, winter squash, zucchini.  Starchy Vegetables. Baked beans, kidney beans, lima beans, split peas, lentils, potatoes (with skin).  Grains. Whole wheat bread, brown rice, bran flake cereal, plain oatmeal, white rice, shredded wheat, bran muffins. SEEK IMMEDIATE MEDICAL CARE IF:   You develop increasing pain or severe bloating.  You have an oral temperature above 102 F (38.9 C), not controlled by medicine.  You develop vomiting or bowel movements that are bloody or black. Document Released: 05/30/2004 Document Revised: 11/25/2011 Document Reviewed: 01/31/2010 [facility] Patient Information 2014 [place].

## 2014-01-12 NOTE — Discharge Summary (Signed)
Physician Discharge Summary  [name] QMV:[id number] DOB: [birth year] DOA: 01/10/2014  PCP: Evalee Mutton, CCT  Admit date: 01/10/2014 Discharge date: 01/12/2014  Recommendations for Outpatient Follow-up:  1. Pt will need to follow up with PCP in 2-3 weeks post discharge 2. Please obtain BMP to evaluate electrolytes and kidney function 3. Please also check CBC to evaluate Hg and Hct levels  Discharge Diagnoses:  Principal Problem:   GI bleed Active Problems:   HYPOTHYROIDISM   HYPERTENSION   CAD, NATIVE VESSEL   PULMONARY HYPERTENSION, SECONDARY   CAROTID ARTERY DISEASE   COPD   GERD (gastroesophageal reflux disease)   Acute blood loss anemia  Discharge Condition: Stable  Diet recommendation: Heart healthy diet discussed in details   HPI: 80 y.o. female has a past medical history significant for COPD, disease, hypertension, hypothyroidism, HTN, pulmonary hypertension, history of GI bleeding due to colonic AVMs, iron deficiency anemia, presented to the ED with a chief complaint of 2 diarrhea episodes yesterday with bright red blood.   Assessment/Plan:  GI bleed - GI consulted, appreciate input, diverticulosis noted on colonoscopy, dietary recommendations provided and pt appreciated input  COPD - patient appears compensated at this point and is on baseline oxygen. CXR without acute processes.  Coronary artery disease - continue home medications  Hypothyroidism - continue Synthroid. Last TSH was checked in 2013, rechecked and within normal limits.  Acute blood loss anemia - due to #1. There is no need for transfusion right now.  Hyperlipidemia - continue statin   Code Status: Partial code, no intubation  Family Communication: Discussed with patient and daughter in law at bedside   Consultants:   Gastroenterology Procedures:   None Antibiotics  None   Discharge Exam: Filed Vitals:   01/12/14 1000  BP: 124/47  Pulse:   Temp: 98.1 F (36.7 C)  Resp: 16    Filed Vitals:   01/12/14 0940 01/12/14 0945 01/12/14 0946 01/12/14 1000  BP:    124/47  Pulse: 90 91 94   Temp:    98.1 F (36.7 C)  TempSrc:    Oral  Resp: 17 19 20 16   Height:      Weight:      SpO2: 100% 100% 100%     General: Pt is alert, follows commands appropriately, not in acute distress Cardiovascular: Regular rate and rhythm, S1/S2 +, no murmurs, no rubs, no gallops Respiratory: Clear to auscultation bilaterally, no wheezing, no crackles, no rhonchi Abdominal: Soft, non tender, non distended, bowel sounds +, no guarding Extremities: no edema, no cyanosis, pulses palpable bilaterally DP and PT Neuro: Grossly nonfocal  Discharge Instructions  Discharge Orders   Future Appointments Provider Department Dept Phone   02/16/2014 10:00 AM [facility] [place] [phone]   02/16/2014 10:30 AM [facility] Covering Provider [facility] [phone]   Future Orders Complete By Expires   Diet - low sodium heart healthy  As directed    Increase activity slowly  As directed        Medication List         albuterol 108 (90 BASE) MCG/ACT inhaler  Commonly known as:  PROVENTIL HFA;VENTOLIN HFA  Inhale 2 puffs into the lungs every 6 (six) hours as needed for wheezing or shortness of breath.     aspirin 81 MG EC tablet  Take 81 mg by mouth daily.     atorvastatin 20 MG tablet  Commonly known as:  LIPITOR  Take 20 mg by mouth daily.     budesonide-formoterol 160-4.5 MCG/ACT inhaler  Commonly known as:  SYMBICORT  Inhale 2 puffs into the lungs 2 (two) times daily.     CALCIUM 600 1500 MG Tabs  Generic drug:  Calcium Carbonate  Take 1 tablet by mouth daily.     furosemide 20 MG tablet  Commonly known as:  LASIX  Take 20 mg by mouth daily.     HYDROcodone-acetaminophen 5-325 MG per tablet  Commonly known as:  NORCO/VICODIN  Take 1 tablet by mouth every 6 (six) hours as needed for moderate pain.      levothyroxine 50 MCG tablet  Commonly known as:  SYNTHROID, LEVOTHROID  Take 150 mcg by mouth daily.     omeprazole 20 MG capsule  Commonly known as:  PRILOSEC  Take 20 mg by mouth daily.     ONE-A-DAY EXTRAS ANTIOXIDANT Caps  Take 1 capsule by mouth daily.     SUDAFED 24 HOUR PO  Take 1 tablet by mouth daily.     tiotropium 18 MCG inhalation capsule  Commonly known as:  SPIRIVA  Place 18 mcg into inhaler and inhale daily.     tolterodine 2 MG 24 hr capsule  Commonly known as:  DETROL LA  Take 2 mg by mouth daily.     VITAMIN D-3 PO  Take 1,000 Units by mouth daily.           Follow-up Information   Schedule an appointment as soon as possible for a visit with ROBERTS, ALYSIA M, CCT.   Contact information:   [place] [address]       Follow up with Faye Ramsay, MD. (call my cell phone [phone])    Specialty:  Internal Medicine   Contact information:   [address] [place] [address] [phone]        The results of significant diagnostics from this hospitalization (including imaging, microbiology, ancillary and laboratory) are listed below for reference.     Microbiology: No results found for this or any previous visit (from the past 240 hour(s)).   Labs: Basic Metabolic Panel:  Recent Labs Lab 01/10/14 1439 01/11/14 0450  NA 139 142  K 3.7 3.9  CL 98 103  CO2 31 31  GLUCOSE 162* 116*  BUN 22 19  CREATININE 0.65 0.61  CALCIUM 9.4 8.6   Liver Function Tests:  Recent Labs Lab 01/10/14 1439  AST 18  ALT 10  ALKPHOS 78  BILITOT 0.2*  PROT 7.1  ALBUMIN 3.5   CBC:  Recent Labs Lab 01/10/14 1439 01/10/14 2235 01/11/14 0450 01/11/14 1325 01/12/14 0335  WBC 9.8 10.3 8.6 10.4 8.5  NEUTROABS 6.0  --   --   --   --   HGB 9.6* 9.2* 8.5* 8.7* 9.1*  HCT 29.9* 28.8* 27.5* 26.8* 28.7*  MCV 92.3 92.3 94.2 92.1 92.6  PLT 234 215 201 213 233     SIGNED: Time coordinating discharge: Over 30  minutes  Theodis Blaze, MD  [facility] 01/12/2014, 12:46 PM Pager [phone]  If 7PM-7AM, please contact night-coverage [email or web address] Password [email or web address]

## 2014-01-12 NOTE — Progress Notes (Signed)
Pt discharged to home in stable condition with niece.  Discharge paperwork reviewed and signed with pt and niece, who both deny further questions or concerns at this time.  Garry Heater, RN 01/12/2014

## 2014-01-12 NOTE — Interval H&P Note (Signed)
History and Physical Interval Note:  01/12/2014 9:06 AM  [name]  has presented today for surgery, with the diagnosis of Lower GI bleed  The various methods of treatment have been discussed with the patient and family. After consideration of risks, benefits and other options for treatment, the patient has consented to  Procedure(s) with comments: COLONOSCOPY (N/A) - MAC if available HOT HEMOSTASIS (ARGON PLASMA COAGULATION/BICAP) (N/A) as a surgical intervention .  The patient's history has been reviewed, patient examined, no change in status, stable for surgery.  I have reviewed the patient's chart and labs.  Questions were answered to the patient's satisfaction.     [name]

## 2014-01-12 NOTE — H&P (View-Only) (Signed)
Consultation  Referring Provider:  [facility]   Primary Care Physician:  Evalee Mutton, [place] [facility] Gastroenterologist: Formerly seen in [place] and also in [place]. No local GI.        Reason for Consultation:   GI bleed           HPI:   [name] is a 81 y.o. female with multiple medical problems, including COPD on home oxygen. Patient admitted yesterday with rectal bleeding and severe anemia. She has a longstanding history of occult GI bleeding and has required numerous transfusions in the last 10+ years. She has apparently had numerous EGDs as well as small bowel video capsule study. Patient has been followed by hematology for the last few years and gets periodic iron infusions ( last one was last Thursday). Ten years ago patient had overt hematochezia and apparently found to have colonic AVMS [facility]).  No AVMs on last colonoscopy in 2012. She had not had any overt bleeding since being seen at [facility] 10 + years ago until this Sunday. On Sunday she had two episodes of painless hematochezia. No bleeding yesterday and no BMs /bleeding today.  Niece present and reports that  on 4/2 hgb was around 44 ( drawn at [facility]), it was 9.6 yesterday, down to 8.5 today after IVF. Patient takes a daily  ASA, no other blood thinning agents. She feels a little weak.     Past Medical History  Diagnosis Date  . COPD (chronic obstructive pulmonary disease)   . Hypertrophic obstructive cardiomyopathy     Echo 10/10 EF 65-70% SW 1.6 PW 1.4 mild SAM no LVOT gradient at rest. Valsalva not performed Mild MR. No hyperenhance ment on MRI EF 69%.  . Coronary atherosclerosis of native coronary artery     Cath 1/11: RCA 50-60 ostial o/w normal. EF 70%.   . Pulmonary hypertension     Cath 1/11 RA 8, RV 43/6/14, PA 42/17 (29) PCWP 15 Fick  5.0/2.6. PVR 2.8    . Edema   . Occlusion and stenosis of carotid artery     s/p L CEA u/s 3/11. R 60-79%; L 40-59%. no  change since 3/10  . Peripheral vascular disease, unspecified   . Hypertension   . Emphysema   . Asthma   . Hypothyroidism   . Allergic rhinitis   . Gastric AVM   . Heart murmur   . Anginal pain     "pressure"  . Emphysema   . Oxygen dependent 04/23/2012    "concentrated at night; liquid during day"  . Dyspnea 04/23/2012    "all the time"  . Anemia 04/23/2012    "severe @ times; think caused by colonic AVMs"  . History of blood transfusion     "many times"  . H/O hiatal hernia   . Stroke ~ 2004    denies residual on 04/23/2012  . Arthritis     "probably"  . Breast cancer   . Pneumonia     Past Surgical History  Procedure Laterality Date  . Carotid endarterectomy  2005    left  . Thyroidectomy  2007  . Appendectomy  1950  . Breast lumpectomy  2002    right-hx of radiation  . Dilation and curettage of uterus  1950's  . Cataract extraction w/ intraocular lens implant  02/2011    right  . Excisional hemorrhoidectomy  1950's  . Cardiac catheterization      Family History  Problem Relation Age of Onset  . Emphysema Father   . COPD Sister   . Hypertension Sister     Pulmonary  . Emphysema Sister   . Asthma Sister   . Asthma Sister   . Emphysema Brother     x2  . Colon cancer Brother   . Colon cancer Mother   . Stroke Mother      History  Substance Use Topics  . Smoking status: Former Smoker -- 1.00 packs/day for 30 years    Types: Cigarettes    Quit date: 09/16/1997  . Smokeless tobacco: Never Used  . Alcohol Use: No    Prior to Admission medications   Medication Sig Start Date End Date Taking? Authorizing Provider  albuterol (PROVENTIL HFA;VENTOLIN HFA) 108 (90 BASE) MCG/ACT inhaler Inhale 2 puffs into the lungs every 6 (six) hours as needed for wheezing or shortness of breath.   Yes Historical Provider, MD  aspirin 81 MG EC tablet Take 81 mg by mouth daily.     Yes Historical Provider, MD  atorvastatin (LIPITOR) 20 MG tablet Take 20 mg by mouth daily.      Yes Historical Provider, MD  budesonide-formoterol (SYMBICORT) 160-4.5 MCG/ACT inhaler Inhale 2 puffs into the lungs 2 (two) times daily.   Yes Historical Provider, MD  Calcium Carbonate (CALCIUM 600) 1500 MG TABS Take 1 tablet by mouth daily.     Yes Historical Provider, MD  Cholecalciferol (VITAMIN D-3 PO) Take 1,000 Units by mouth daily.    Yes Historical Provider, MD  furosemide (LASIX) 20 MG tablet Take 20 mg by mouth daily.     Yes Historical Provider, MD  HYDROcodone-acetaminophen (NORCO/VICODIN) 5-325 MG per tablet Take 1 tablet by mouth every 6 (six) hours as needed for moderate pain.   Yes Historical Provider, MD  levothyroxine (SYNTHROID, LEVOTHROID) 50 MCG tablet Take 150 mcg by mouth daily.    Yes Historical Provider, MD  Multiple Vitamins-Minerals (ONE-A-DAY EXTRAS ANTIOXIDANT) CAPS Take 1 capsule by mouth daily.     Yes Historical Provider, MD  omeprazole (PRILOSEC) 20 MG capsule Take 20 mg by mouth daily.   Yes Historical Provider, MD  Pseudoephedrine HCl (SUDAFED 24 HOUR PO) Take 1 tablet by mouth daily.   Yes Historical Provider, MD  tiotropium (SPIRIVA) 18 MCG inhalation capsule Place 18 mcg into inhaler and inhale daily.   Yes Historical Provider, MD  tolterodine (DETROL LA) 2 MG 24 hr capsule Take 2 mg by mouth daily.   Yes Historical Provider, MD    Current Facility-Administered Medications  Medication Dose Route Frequency Provider Last Rate Last Dose  . 0.9 %  sodium chloride infusion   Intravenous Continuous Caren Griffins, MD 50 mL/hr at 01/11/14 0344    . albuterol (PROVENTIL) (2.5 MG/3ML) 0.083% nebulizer solution 3 mL  3 mL Inhalation Q6H PRN Costin Karlyne Greenspan, MD      . atorvastatin (LIPITOR) tablet 20 mg  20 mg Oral Daily Costin Karlyne Greenspan, MD      . budesonide-formoterol (SYMBICORT) 160-4.5 MCG/ACT inhaler 2 puff  2 puff Inhalation BID Caren Griffins, MD   2 puff at 01/11/14 0858  . HYDROcodone-acetaminophen (NORCO/VICODIN) 5-325 MG per tablet 1 tablet  1 tablet  Oral Q6H PRN Costin Karlyne Greenspan, MD      . levothyroxine (SYNTHROID, LEVOTHROID) tablet 150 mcg  150 mcg Oral QAC breakfast Caren Griffins, MD   150 mcg at 01/11/14 0830  . pantoprazole (PROTONIX) injection 40 mg  40 mg Intravenous  QHS Caren Griffins, MD   40 mg at 01/10/14 2224  . sodium chloride 0.9 % injection 3 mL  3 mL Intravenous Q12H Costin Karlyne Greenspan, MD      . tiotropium [facility]) inhalation capsule 18 mcg  18 mcg Inhalation Daily Caren Griffins, MD   18 mcg at 01/10/14 2134    Allergies as of 01/10/2014 - Review Complete 01/10/2014  Allergen Reaction Noted  . Latex Hives and Itching 12/11/2010    Review of Systems:    All systems reviewed and negative except where noted in HPI.   Physical Exam:  Vital signs in last 24 hours: Temp:  [98.1 F (36.7 C)-98.5 F (36.9 C)] 98.5 F (36.9 C) (04/28 0530) Pulse Rate:  [84-106] 84 (04/28 0530) Resp:  [20-26] 20 (04/28 0530) BP: (98-141)/(45-66) 98/45 mmHg (04/28 0530) SpO2:  [95 %-100 %] 95 % (04/28 0859) Weight:  [163 lb (73.936 kg)] 163 lb (73.936 kg) (04/27 1835) Last BM Date: 01/09/14 General:   Pleasant white female in NAD Head:  Normocephalic and atraumatic. Eyes:   No icterus.   Conjunctiva pink. Ears:  Normal auditory acuity. Neck:  Supple; no masses felt Lungs:  Respirations even and unlabored. Lungs clear to auscultation bilaterally.  No wheezes, crackles, or rhonchi.  Heart:  Regular rate and rhythm;  murmur heard. Abdomen:  Soft, nondistended, nontender. Normal bowel sounds. No appreciable masses or hepatomegaly.  Rectal:  No external lesions seen, no obvious hemorrhoids / fissures. Heme + in ED yesterday. Msk:  Symmetrical without gross deformities.  Extremities:  Without edema. Neurologic:  Alert and  oriented x4;  grossly normal neurologically. Skin:  Intact without significant lesions or rashes. Cervical Nodes:  No significant cervical adenopathy. Psych:  Alert and cooperative. Normal affect.  LAB  RESULTS:  Recent Labs  01/10/14 1439 01/10/14 2235 01/11/14 0450  WBC 9.8 10.3 8.6  HGB 9.6* 9.2* 8.5*  HCT 29.9* 28.8* 27.5*  PLT 234 215 201   BMET  Recent Labs  01/10/14 1439 01/11/14 0450  NA 139 142  K 3.7 3.9  CL 98 103  CO2 31 31  GLUCOSE 162* 116*  BUN 22 19  CREATININE 0.65 0.61  CALCIUM 9.4 8.6   LFT  Recent Labs  01/10/14 1439  PROT 7.1  ALBUMIN 3.5  AST 18  ALT 10  ALKPHOS 78  BILITOT 0.2*   STUDIES: Dg Chest Port 1 View  01/10/2014   CLINICAL DATA:  COPD. Bloody bowel movements. Hypertension. Asthma.  EXAM: PORTABLE CHEST - 1 VIEW  COMPARISON:  04/23/2012  FINDINGS: Numerous leads and wires project over the chest. Midline trachea. Normal heart size with a tortuous, atherosclerotic aorta. No pleural effusion or pneumothorax. Bullous disease at the apices, greater on the right. Lower lobe predominant interstitial thickening which is moderate and not significantly changed. No lobar consolidation. No congestive failure.  IMPRESSION: COPD/chronic bronchitis, without superimposed acute process.   Electronically Signed   By: Abigail Miyamoto M.D.   On: 01/10/2014 18:28    PREVIOUS ENDOSCOPIES:            EGDs / colonoscopies / Givens capsule - all done in [place] and [place]. Will locate reports.    Impression / Plan:   60. 80 year old female with painless hematochezia x2 on Sunday, none since. Bleeding associated with significant drop in hgb. This could be from recurrent colonic AVMs. Will locate last colonoscopy report, hopefully this will not need to be repeated.    2.  Acute on chronic normocytic anemia with drop of hgb from 14 to 9.6.  She has required numerous blood transfusions and iron infusions through the years. Followed by Hematology. Last iron infusion was last Thursday. Probably has chronic low grade GI blood loss from AVMs. Suspect she will need some blood prior to discharge.   3. Multiple medical problems as listed in PMH. On home 02.    Thanks   LOS: 1 day   Willia Craze  01/11/2014, 9:02 AM    [facility] Attending  I have also seen and assessed the patient and agree with the above note. Given her overt bleeding I think a diagnostic and possibly therapeutic colonoscopy make sense. The risks and benefits as well as alternatives of endoscopic procedure(s) have been discussed and reviewed. All questions answered. The patient agrees to proceed. Niece agrees also.  Gatha Mayer, MD, [facility] [phone] (pager) 01/11/2014 10:44 AM

## 2014-01-13 ENCOUNTER — Encounter (HOSPITAL_COMMUNITY): Payer: Self-pay | Admitting: Internal Medicine

## 2014-01-13 NOTE — ED Provider Notes (Signed)
Medical screening examination/treatment/procedure(s) were performed by non-physician practitioner and as supervising physician I was immediately available for consultation/collaboration.   EKG Interpretation None       Virgel Manifold, MD 01/13/14 [phone]

## 2014-01-14 ENCOUNTER — Inpatient Hospital Stay (HOSPITAL_COMMUNITY)
Admission: AD | Admit: 2014-01-14 | Discharge: 2014-01-20 | DRG: 871 | Disposition: A | Discharge status: Home Health | Payer: Medicare Other | Source: Ambulatory Visit | Attending: Internal Medicine | Admitting: Internal Medicine

## 2014-01-14 ENCOUNTER — Inpatient Hospital Stay (HOSPITAL_COMMUNITY): Payer: Medicare Other

## 2014-01-14 DIAGNOSIS — I6529 Occlusion and stenosis of unspecified carotid artery: Secondary | ICD-10-CM | POA: Diagnosis present

## 2014-01-14 DIAGNOSIS — Z823 Family history of stroke: Secondary | ICD-10-CM

## 2014-01-14 DIAGNOSIS — I10 Essential (primary) hypertension: Secondary | ICD-10-CM | POA: Diagnosis present

## 2014-01-14 DIAGNOSIS — Z8249 Family history of ischemic heart disease and other diseases of the circulatory system: Secondary | ICD-10-CM

## 2014-01-14 DIAGNOSIS — I509 Heart failure, unspecified: Secondary | ICD-10-CM | POA: Diagnosis present

## 2014-01-14 DIAGNOSIS — N179 Acute kidney failure, unspecified: Secondary | ICD-10-CM | POA: Diagnosis present

## 2014-01-14 DIAGNOSIS — E039 Hypothyroidism, unspecified: Secondary | ICD-10-CM | POA: Diagnosis present

## 2014-01-14 DIAGNOSIS — Z79899 Other long term (current) drug therapy: Secondary | ICD-10-CM

## 2014-01-14 DIAGNOSIS — Z8673 Personal history of transient ischemic attack (TIA), and cerebral infarction without residual deficits: Secondary | ICD-10-CM

## 2014-01-14 DIAGNOSIS — I5043 Acute on chronic combined systolic (congestive) and diastolic (congestive) heart failure: Secondary | ICD-10-CM | POA: Diagnosis present

## 2014-01-14 DIAGNOSIS — A419 Sepsis, unspecified organism: Principal | ICD-10-CM | POA: Diagnosis present

## 2014-01-14 DIAGNOSIS — D638 Anemia in other chronic diseases classified elsewhere: Secondary | ICD-10-CM | POA: Diagnosis present

## 2014-01-14 DIAGNOSIS — I421 Obstructive hypertrophic cardiomyopathy: Secondary | ICD-10-CM | POA: Diagnosis present

## 2014-01-14 DIAGNOSIS — R509 Fever, unspecified: Secondary | ICD-10-CM | POA: Diagnosis present

## 2014-01-14 DIAGNOSIS — Z825 Family history of asthma and other chronic lower respiratory diseases: Secondary | ICD-10-CM

## 2014-01-14 DIAGNOSIS — M129 Arthropathy, unspecified: Secondary | ICD-10-CM | POA: Diagnosis present

## 2014-01-14 DIAGNOSIS — I2510 Atherosclerotic heart disease of native coronary artery without angina pectoris: Secondary | ICD-10-CM | POA: Diagnosis present

## 2014-01-14 DIAGNOSIS — Z923 Personal history of irradiation: Secondary | ICD-10-CM

## 2014-01-14 DIAGNOSIS — Z800 Family history of malignant neoplasm of digestive organs: Secondary | ICD-10-CM

## 2014-01-14 DIAGNOSIS — Z9981 Dependence on supplemental oxygen: Secondary | ICD-10-CM

## 2014-01-14 DIAGNOSIS — E785 Hyperlipidemia, unspecified: Secondary | ICD-10-CM | POA: Diagnosis present

## 2014-01-14 DIAGNOSIS — Z87891 Personal history of nicotine dependence: Secondary | ICD-10-CM

## 2014-01-14 DIAGNOSIS — K219 Gastro-esophageal reflux disease without esophagitis: Secondary | ICD-10-CM | POA: Diagnosis present

## 2014-01-14 DIAGNOSIS — R6520 Severe sepsis without septic shock: Secondary | ICD-10-CM

## 2014-01-14 DIAGNOSIS — E876 Hypokalemia: Secondary | ICD-10-CM | POA: Diagnosis present

## 2014-01-14 DIAGNOSIS — M25569 Pain in unspecified knee: Secondary | ICD-10-CM | POA: Diagnosis present

## 2014-01-14 DIAGNOSIS — J309 Allergic rhinitis, unspecified: Secondary | ICD-10-CM

## 2014-01-14 DIAGNOSIS — J189 Pneumonia, unspecified organism: Secondary | ICD-10-CM | POA: Diagnosis present

## 2014-01-14 DIAGNOSIS — J4489 Other specified chronic obstructive pulmonary disease: Secondary | ICD-10-CM | POA: Diagnosis present

## 2014-01-14 DIAGNOSIS — D649 Anemia, unspecified: Secondary | ICD-10-CM | POA: Diagnosis present

## 2014-01-14 DIAGNOSIS — I5033 Acute on chronic diastolic (congestive) heart failure: Secondary | ICD-10-CM | POA: Diagnosis present

## 2014-01-14 DIAGNOSIS — R7989 Other specified abnormal findings of blood chemistry: Secondary | ICD-10-CM | POA: Diagnosis present

## 2014-01-14 DIAGNOSIS — D62 Acute posthemorrhagic anemia: Secondary | ICD-10-CM

## 2014-01-14 DIAGNOSIS — N390 Urinary tract infection, site not specified: Secondary | ICD-10-CM

## 2014-01-14 DIAGNOSIS — K922 Gastrointestinal hemorrhage, unspecified: Secondary | ICD-10-CM

## 2014-01-14 DIAGNOSIS — Z853 Personal history of malignant neoplasm of breast: Secondary | ICD-10-CM

## 2014-01-14 DIAGNOSIS — J449 Chronic obstructive pulmonary disease, unspecified: Secondary | ICD-10-CM | POA: Diagnosis present

## 2014-01-14 DIAGNOSIS — Z7982 Long term (current) use of aspirin: Secondary | ICD-10-CM

## 2014-01-14 DIAGNOSIS — I739 Peripheral vascular disease, unspecified: Secondary | ICD-10-CM | POA: Diagnosis present

## 2014-01-14 DIAGNOSIS — I6789 Other cerebrovascular disease: Secondary | ICD-10-CM

## 2014-01-14 DIAGNOSIS — J9600 Acute respiratory failure, unspecified whether with hypoxia or hypercapnia: Secondary | ICD-10-CM | POA: Diagnosis present

## 2014-01-14 DIAGNOSIS — I2789 Other specified pulmonary heart diseases: Secondary | ICD-10-CM | POA: Diagnosis present

## 2014-01-14 LAB — CBC
HCT: 26.4 % — ABNORMAL LOW (ref 36.0–46.0)
Hemoglobin: 8.3 g/dL — ABNORMAL LOW (ref 12.0–15.0)
MCH: 29.7 pg (ref 26.0–34.0)
MCHC: 31.4 g/dL (ref 30.0–36.0)
MCV: 94.6 fL (ref 78.0–100.0)
PLATELETS: 276 10*3/uL (ref 150–400)
RBC: 2.79 MIL/uL — AB (ref 3.87–5.11)
RDW: 14.3 % (ref 11.5–15.5)
WBC: 10.6 10*3/uL — AB (ref 4.0–10.5)

## 2014-01-14 LAB — LACTIC ACID, PLASMA: LACTIC ACID, VENOUS: 1.3 mmol/L (ref 0.5–2.2)

## 2014-01-14 MED ORDER — LEVOTHYROXINE SODIUM 150 MCG PO TABS
150.0000 ug | ORAL_TABLET | Freq: Every day | ORAL | Status: DC
  Administered 2014-01-15 – 2014-01-20 (×6): 150 ug via ORAL
  Filled 2014-01-14 (×7): qty 1

## 2014-01-14 MED ORDER — PIPERACILLIN-TAZOBACTAM 3.375 G IVPB
3.3750 g | Freq: Three times a day (TID) | INTRAVENOUS | Status: DC
  Administered 2014-01-14 – 2014-01-17 (×8): 3.375 g via INTRAVENOUS
  Filled 2014-01-14 (×10): qty 50

## 2014-01-14 MED ORDER — ASPIRIN EC 81 MG PO TBEC
81.0000 mg | DELAYED_RELEASE_TABLET | Freq: Every day | ORAL | Status: DC
  Administered 2014-01-15 – 2014-01-20 (×6): 81 mg via ORAL
  Filled 2014-01-14 (×6): qty 1

## 2014-01-14 MED ORDER — LORAZEPAM 2 MG/ML IJ SOLN
1.0000 mg | Freq: Once | INTRAMUSCULAR | Status: AC
  Administered 2014-01-15: 1 mg via INTRAVENOUS
  Filled 2014-01-14: qty 1

## 2014-01-14 MED ORDER — FESOTERODINE FUMARATE ER 4 MG PO TB24
4.0000 mg | ORAL_TABLET | Freq: Every day | ORAL | Status: DC
  Administered 2014-01-15 – 2014-01-20 (×6): 4 mg via ORAL
  Filled 2014-01-14 (×8): qty 1

## 2014-01-14 MED ORDER — SODIUM CHLORIDE 0.9 % IV SOLN
INTRAVENOUS | Status: DC
  Administered 2014-01-14 – 2014-01-15 (×2): via INTRAVENOUS

## 2014-01-14 MED ORDER — HYDROCODONE-ACETAMINOPHEN 5-325 MG PO TABS
1.0000 | ORAL_TABLET | ORAL | Status: DC | PRN
  Administered 2014-01-19: 1 via ORAL
  Filled 2014-01-14: qty 1

## 2014-01-14 MED ORDER — PANTOPRAZOLE SODIUM 40 MG PO TBEC
40.0000 mg | DELAYED_RELEASE_TABLET | Freq: Every day | ORAL | Status: DC
  Administered 2014-01-15 – 2014-01-20 (×6): 40 mg via ORAL
  Filled 2014-01-14 (×6): qty 1

## 2014-01-14 MED ORDER — TIOTROPIUM BROMIDE MONOHYDRATE 18 MCG IN CAPS
18.0000 ug | ORAL_CAPSULE | Freq: Every day | RESPIRATORY_TRACT | Status: DC
  Administered 2014-01-15 – 2014-01-19 (×5): 18 ug via RESPIRATORY_TRACT
  Filled 2014-01-14 (×2): qty 5

## 2014-01-14 MED ORDER — ONDANSETRON HCL 4 MG/2ML IJ SOLN: 4.0000 mg | Freq: Four times a day (QID) | INTRAMUSCULAR | Status: DC | PRN

## 2014-01-14 MED ORDER — VANCOMYCIN HCL IN DEXTROSE 750-5 MG/150ML-% IV SOLN
750.0000 mg | Freq: Two times a day (BID) | INTRAVENOUS | Status: DC
Start: 2014-01-14 — End: 2014-01-17
  Administered 2014-01-14 – 2014-01-16 (×5): 750 mg via INTRAVENOUS
  Filled 2014-01-14 (×6): qty 150

## 2014-01-14 MED ORDER — ENOXAPARIN SODIUM 40 MG/0.4ML ~~LOC~~ SOLN
40.0000 mg | Freq: Every day | SUBCUTANEOUS | Status: DC
  Administered 2014-01-15: 40 mg via SUBCUTANEOUS
  Filled 2014-01-14 (×2): qty 0.4

## 2014-01-14 MED ORDER — ONDANSETRON HCL 4 MG PO TABS
4.0000 mg | ORAL_TABLET | Freq: Four times a day (QID) | ORAL | Status: DC | PRN
  Administered 2014-01-18: 4 mg via ORAL
  Filled 2014-01-14: qty 1

## 2014-01-14 MED ORDER — BUDESONIDE-FORMOTEROL FUMARATE 160-4.5 MCG/ACT IN AERO
2.0000 | INHALATION_SPRAY | Freq: Two times a day (BID) | RESPIRATORY_TRACT | Status: DC
  Administered 2014-01-14 – 2014-01-20 (×12): 2 via RESPIRATORY_TRACT
  Filled 2014-01-14: qty 6

## 2014-01-14 MED ORDER — LEVALBUTEROL HCL 0.63 MG/3ML IN NEBU
0.6300 mg | INHALATION_SOLUTION | RESPIRATORY_TRACT | Status: DC | PRN
  Administered 2014-01-16: 0.63 mg via RESPIRATORY_TRACT
  Filled 2014-01-14: qty 3

## 2014-01-14 NOTE — H&P (Signed)
[facility] History and Physical  [name] SAY:[id number] DOB: 1933-04-22 DOA: 01/14/2014  Referring physician: ED physician PCP: Evalee Mutton, [facility]   Chief Complaint: fever   HPI:  Pt is 80 yo female recently discharged after hospitalization for GI bleed secondary to diverticulosis. Discharged and felt well but now came back with sudden onset of weakness and fevers T max 101. She has called me from home and explained her symptoms, I have advised her to come to the [facility] ED for direct admission and evaluation. She reports chest congestion with cough, productive of clear sputum, no specific abdominal or urinary concerns.   Assessment and Plan: Active Problems: Fever - unclear etiology - will admit to medical floor - start with checking CMET, CBC, lactic acid, procalcitonin, UA - urine culture, blood culture, CXR, sputum culture - empiric ABX Vancomycin and Zosyn    Radiological Exams on Admission: No results found.   Code Status: Full Family Communication: Pt at bedside Disposition Plan: Admit for further evaluation     Review of Systems:  Constitutional: Negative for fever, chills and malaise/fatigue. Negative for diaphoresis.  HENT: Negative for hearing loss, ear pain, nosebleeds, congestion, sore throat, neck pain, tinnitus and ear discharge.   Eyes: Negative for blurred vision, double vision, photophobia, pain, discharge and redness.  Respiratory: Negative for cough, hemoptysis, sputum production, shortness of breath, wheezing and stridor.   Cardiovascular: Negative for chest pain, palpitations, orthopnea, claudication and leg swelling.  Gastrointestinal: Negative for nausea, vomiting and abdominal pain. Negative for heartburn, constipation, blood in stool and melena.  Genitourinary: Negative for dysuria, urgency, frequency, hematuria and flank pain.  Musculoskeletal: Negative for myalgias, back pain, joint pain and falls.  Skin: Negative for itching and rash.   Neurological: Negative for dizziness and weakness. Negative for tingling, tremors, sensory change, speech change, focal weakness, loss of consciousness and headaches.  Endo/Heme/Allergies: Negative for environmental allergies and polydipsia. Does not bruise/bleed easily.  Psychiatric/Behavioral: Negative for suicidal ideas. The patient is not nervous/anxious.      Past Medical History  Diagnosis Date  . COPD (chronic obstructive pulmonary disease)   . Hypertrophic obstructive cardiomyopathy     Echo 10/10 EF 65-70% SW 1.6 PW 1.4 mild SAM no LVOT gradient at rest. Valsalva not performed Mild MR. No hyperenhance ment on MRI EF 69%.  . Coronary atherosclerosis of native coronary artery     Cath 1/11: RCA 50-60 ostial o/w normal. EF 70%.   . Pulmonary hypertension     Cath 1/11 RA 8, RV 43/6/14, PA 42/17 (29) PCWP 15 Fick  5.0/2.6. PVR 2.8    . Edema   . Occlusion and stenosis of carotid artery     s/p L CEA u/s 3/11. R 60-79%; L 40-59%. no change since 3/10  . Peripheral vascular disease, unspecified   . Hypertension   . Emphysema   . Asthma   . Hypothyroidism   . Allergic rhinitis   . Gastric AVM   . Heart murmur   . Anginal pain     "pressure"  . Emphysema   . Oxygen dependent 04/23/2012    "concentrated at night; liquid during day"  . Dyspnea 04/23/2012    "all the time"  . Anemia 04/23/2012    "severe @ times; think caused by colonic AVMs"  . History of blood transfusion     "many times"  . H/O hiatal hernia   . Stroke ~ 2004    denies residual on 04/23/2012  . Arthritis     "  probably"  . Breast cancer   . Pneumonia     Past Surgical History  Procedure Laterality Date  . Carotid endarterectomy  2005    left  . Thyroidectomy  2007  . Appendectomy  1950  . Breast lumpectomy  2002    right-hx of radiation  . Dilation and curettage of uterus  1950's  . Cataract extraction w/ intraocular lens implant  02/2011    right  . Excisional hemorrhoidectomy  1950's  . Cardiac  catheterization    . Colonoscopy N/A 01/12/2014    Procedure: COLONOSCOPY;  Surgeon: Gatha Mayer, MD;  Location: [facility] ENDOSCOPY;  Service: Endoscopy;  Laterality: N/A;  MAC if available    Social History:  reports that she quit smoking about 16 years ago. Her smoking use included Cigarettes. She has a 30 pack-year smoking history. She has never used smokeless tobacco. She reports that she does not drink alcohol or use illicit drugs.  Allergies  Allergen Reactions  . Latex Hives and Itching    Family History  Problem Relation Age of Onset  . Emphysema Father   . COPD Sister   . Hypertension Sister     Pulmonary  . Emphysema Sister   . Asthma Sister   . Asthma Sister   . Emphysema Brother     x2  . Colon cancer Brother   . Colon cancer Mother   . Stroke Mother     Prior to Admission medications   Medication Sig Start Date End Date Taking? Authorizing Provider  albuterol (PROVENTIL HFA;VENTOLIN HFA) 108 (90 BASE) MCG/ACT inhaler Inhale 2 puffs into the lungs every 6 (six) hours as needed for wheezing or shortness of breath.   Yes Historical Provider, MD  aspirin 81 MG EC tablet Take 81 mg by mouth daily.     Yes Historical Provider, MD  atorvastatin (LIPITOR) 20 MG tablet Take 20 mg by mouth daily.     Yes Historical Provider, MD  budesonide-formoterol (SYMBICORT) 160-4.5 MCG/ACT inhaler Inhale 2 puffs into the lungs 2 (two) times daily.   Yes Historical Provider, MD  Calcium Carbonate (CALCIUM 600) 1500 MG TABS Take 1 tablet by mouth daily.     Yes Historical Provider, MD  Cholecalciferol (VITAMIN D-3 PO) Take 1,000 Units by mouth daily.    Yes Historical Provider, MD  furosemide (LASIX) 20 MG tablet Take 20 mg by mouth daily.     Yes Historical Provider, MD  HYDROcodone-acetaminophen (NORCO/VICODIN) 5-325 MG per tablet Take 1 tablet by mouth every 6 (six) hours as needed for moderate pain.   Yes Historical Provider, MD  levothyroxine (SYNTHROID, LEVOTHROID) 50 MCG tablet Take 150  mcg by mouth daily.    Yes Historical Provider, MD  Multiple Vitamins-Minerals (ONE-A-DAY EXTRAS ANTIOXIDANT) CAPS Take 1 capsule by mouth daily.     Yes Historical Provider, MD  omeprazole (PRILOSEC) 20 MG capsule Take 20 mg by mouth daily.   Yes Historical Provider, MD  Pseudoephedrine HCl (SUDAFED 24 HOUR PO) Take 1 tablet by mouth daily.   Yes Historical Provider, MD  tiotropium (SPIRIVA) 18 MCG inhalation capsule Place 18 mcg into inhaler and inhale daily.   Yes Historical Provider, MD  tolterodine (DETROL LA) 2 MG 24 hr capsule Take 2 mg by mouth daily.   Yes Historical Provider, MD    Physical Exam: Filed Vitals:   01/14/14 2215 01/14/14 2222  BP: 106/48   Pulse: 118   Temp: 98.8 F (37.1 C)   TempSrc: Oral  Resp: 22   Height: 5\' 6"  (1.676 m)   Weight: 73.936 kg (163 lb)   SpO2: 76% 99%    Physical Exam  Constitutional: Appears well-developed and well-nourished. No distress.  HENT: Normocephalic. External right and left ear normal. Oropharynx is clear and moist.  Eyes: Conjunctivae and EOM are normal. PERRLA, no scleral icterus.  Neck: Normal ROM. Neck supple. No JVD. No tracheal deviation. No thyromegaly.  CVS: RRR, S1/S2 +, no murmurs, no gallops, no carotid bruit.  Pulmonary: Effort and breath sounds normal, no stridor, rhonchi, wheezes, rales.  Abdominal: Soft. BS +,  no distension, tenderness, rebound or guarding.  Musculoskeletal: Normal range of motion. No edema and no tenderness.  Lymphadenopathy: No lymphadenopathy noted, cervical, inguinal. Neuro: Alert. Normal reflexes, muscle tone coordination. No cranial nerve deficit. Skin: Skin is warm and dry. No rash noted. Not diaphoretic. No erythema. No pallor.  Psychiatric: Normal mood and affect. Behavior, judgment, thought content normal.   Labs on Admission:  Basic Metabolic Panel:  Recent Labs Lab 01/10/14 1439 01/11/14 0450  NA 139 142  K 3.7 3.9  CL 98 103  CO2 31 31  GLUCOSE 162* 116*  BUN 22 19   CREATININE 0.65 0.61  CALCIUM 9.4 8.6   Liver Function Tests:  Recent Labs Lab 01/10/14 1439  AST 18  ALT 10  ALKPHOS 78  BILITOT 0.2*  PROT 7.1  ALBUMIN 3.5   No results found for this basename: LIPASE, AMYLASE,  in the last 168 hours No results found for this basename: AMMONIA,  in the last 168 hours CBC:  Recent Labs Lab 01/10/14 1439 01/10/14 2235 01/11/14 0450 01/11/14 1325 01/12/14 0335  WBC 9.8 10.3 8.6 10.4 8.5  NEUTROABS 6.0  --   --   --   --   HGB 9.6* 9.2* 8.5* 8.7* 9.1*  HCT 29.9* 28.8* 27.5* 26.8* 28.7*  MCV 92.3 92.3 94.2 92.1 92.6  PLT 234 215 201 213 233   Cardiac Enzymes: No results found for this basename: CKTOTAL, CKMB, CKMBINDEX, TROPONINI,  in the last 168 hours BNP: No components found with this basename: POCBNP,  CBG: No results found for this basename: GLUCAP,  in the last 168 hours  EKG: Normal sinus rhythm, no ST/T wave changes  Theodis Blaze, MD  [facility] Pager [phone]  If 7PM-7AM, please contact night-coverage [email or web address] Password [facility] 01/14/2014, 10:24 PM

## 2014-01-15 ENCOUNTER — Encounter (HOSPITAL_COMMUNITY): Payer: Self-pay

## 2014-01-15 DIAGNOSIS — N390 Urinary tract infection, site not specified: Secondary | ICD-10-CM

## 2014-01-15 DIAGNOSIS — J189 Pneumonia, unspecified organism: Secondary | ICD-10-CM

## 2014-01-15 DIAGNOSIS — K219 Gastro-esophageal reflux disease without esophagitis: Secondary | ICD-10-CM

## 2014-01-15 LAB — BASIC METABOLIC PANEL
BUN: 11 mg/dL (ref 6–23)
CO2: 34 mEq/L — ABNORMAL HIGH (ref 19–32)
Calcium: 8.3 mg/dL — ABNORMAL LOW (ref 8.4–10.5)
Chloride: 104 mEq/L (ref 96–112)
Creatinine, Ser: 0.6 mg/dL (ref 0.50–1.10)
GFR, EST NON AFRICAN AMERICAN: 84 mL/min — AB (ref >90)
Glucose, Bld: 123 mg/dL — ABNORMAL HIGH (ref 70–99)
Potassium: 3.4 mEq/L — ABNORMAL LOW (ref 3.7–5.3)
SODIUM: 145 meq/L (ref 137–147)

## 2014-01-15 LAB — CBC
HCT: 26.1 % — ABNORMAL LOW (ref 36.0–46.0)
Hemoglobin: 8.1 g/dL — ABNORMAL LOW (ref 12.0–15.0)
MCH: 29.5 pg (ref 26.0–34.0)
MCHC: 31 g/dL (ref 30.0–36.0)
MCV: 94.9 fL (ref 78.0–100.0)
Platelets: 274 10*3/uL (ref 150–400)
RBC: 2.75 MIL/uL — ABNORMAL LOW (ref 3.87–5.11)
RDW: 14.6 % (ref 11.5–15.5)
WBC: 8.7 10*3/uL (ref 4.0–10.5)

## 2014-01-15 LAB — COMPREHENSIVE METABOLIC PANEL
ALT: 10 U/L (ref 0–35)
AST: 15 U/L (ref 0–37)
Albumin: 3 g/dL — ABNORMAL LOW (ref 3.5–5.2)
Alkaline Phosphatase: 79 U/L (ref 39–117)
BUN: 14 mg/dL (ref 6–23)
CALCIUM: 8.5 mg/dL (ref 8.4–10.5)
CO2: 33 meq/L — AB (ref 19–32)
CREATININE: 0.56 mg/dL (ref 0.50–1.10)
Chloride: 101 mEq/L (ref 96–112)
GFR calc non Af Amer: 86 mL/min — ABNORMAL LOW (ref >90)
GLUCOSE: 143 mg/dL — AB (ref 70–99)
Potassium: 3.8 mEq/L (ref 3.7–5.3)
SODIUM: 143 meq/L (ref 137–147)
TOTAL PROTEIN: 6.5 g/dL (ref 6.0–8.3)
Total Bilirubin: 0.3 mg/dL (ref 0.3–1.2)

## 2014-01-15 LAB — LEGIONELLA ANTIGEN, URINE: LEGIONELLA ANTIGEN, URINE: NEGATIVE

## 2014-01-15 LAB — TROPONIN I: Troponin I: <0.3 ng/mL (ref <0.30)

## 2014-01-15 LAB — URINALYSIS, ROUTINE W REFLEX MICROSCOPIC
BILIRUBIN URINE: NEGATIVE
Glucose, UA: NEGATIVE mg/dL
Hgb urine dipstick: NEGATIVE
Ketones, ur: NEGATIVE mg/dL
Nitrite: NEGATIVE
Protein, ur: NEGATIVE mg/dL
SPECIFIC GRAVITY, URINE: 1.028 (ref 1.005–1.030)
UROBILINOGEN UA: 0.2 mg/dL (ref 0.0–1.0)
pH: 5.5 (ref 5.0–8.0)

## 2014-01-15 LAB — PROCALCITONIN: Procalcitonin: <0.1 ng/mL

## 2014-01-15 LAB — TSH: TSH: 0.462 u[IU]/mL (ref 0.350–4.500)

## 2014-01-15 LAB — PHOSPHORUS: PHOSPHORUS: 2.5 mg/dL (ref 2.3–4.6)

## 2014-01-15 LAB — URINE MICROSCOPIC-ADD ON

## 2014-01-15 LAB — MAGNESIUM: Magnesium: 2.2 mg/dL (ref 1.5–2.5)

## 2014-01-15 LAB — PRO B NATRIURETIC PEPTIDE: Pro B Natriuretic peptide (BNP): 1418 pg/mL — ABNORMAL HIGH (ref 0–450)

## 2014-01-15 LAB — STREP PNEUMONIAE URINARY ANTIGEN: STREP PNEUMO URINARY ANTIGEN: NEGATIVE

## 2014-01-15 LAB — HIV ANTIBODY (ROUTINE TESTING W REFLEX): HIV 1&2 Ab, 4th Generation: NONREACTIVE

## 2014-01-15 MED ORDER — LORAZEPAM 2 MG/ML IJ SOLN
1.0000 mg | Freq: Once | INTRAMUSCULAR | Status: AC
  Administered 2014-01-15: 1 mg via INTRAVENOUS
  Filled 2014-01-15: qty 1

## 2014-01-15 MED ORDER — POTASSIUM CHLORIDE CRYS ER 20 MEQ PO TBCR
40.0000 meq | EXTENDED_RELEASE_TABLET | Freq: Once | ORAL | Status: AC
  Administered 2014-01-15: 40 meq via ORAL
  Filled 2014-01-15: qty 2

## 2014-01-15 MED ORDER — ZOLPIDEM TARTRATE 5 MG PO TABS
5.0000 mg | ORAL_TABLET | Freq: Every evening | ORAL | Status: DC | PRN
  Administered 2014-01-16 – 2014-01-19 (×4): 5 mg via ORAL
  Filled 2014-01-15 (×4): qty 1

## 2014-01-15 MED ORDER — BIOTENE DRY MOUTH MT LIQD
15.0000 mL | Freq: Two times a day (BID) | OROMUCOSAL | Status: DC
  Administered 2014-01-16 – 2014-01-20 (×9): 15 mL via OROMUCOSAL

## 2014-01-15 NOTE — Progress Notes (Signed)
ANTIBIOTIC CONSULT NOTE - INITIAL  Pharmacy Consult for Vancomycin and Zosyn  Indication: rule out sepsis  Allergies  Allergen Reactions  . Latex Hives and Itching    Patient Measurements: Height: 5\' 6"  (167.6 cm) Weight: 163 lb (73.936 kg) IBW/kg (Calculated) : 59.3 Adjusted Body Weight:   Vital Signs: Temp: 98.8 F (37.1 C) (05/02 0454) Temp src: Oral (05/02 0454) BP: 117/46 mmHg (05/02 0454) Pulse Rate: 85 (05/02 0454) Intake/Output from previous day: 05/01 0701 - 05/02 0700 In: 240 [P.O.:240] Out: -  Intake/Output from this shift: Total I/O In: 240 [P.O.:240] Out: -   Labs:  Recent Labs  01/14/14 2320 01/15/14 0455  WBC 10.6* 8.7  HGB 8.3* 8.1*  PLT 276 274  CREATININE 0.56  --    Estimated Creatinine Clearance: 58.6 ml/min (by C-G formula based on Cr of 0.56). No results found for this basename: VANCOTROUGH, VANCOPEAK, VANCORANDOM, GENTTROUGH, GENTPEAK, GENTRANDOM, TOBRATROUGH, TOBRAPEAK, TOBRARND, AMIKACINPEAK, AMIKACINTROU, AMIKACIN,  in the last 72 hours   Microbiology: No results found for this or any previous visit (from the past 720 hour(s)).  Medical History: Past Medical History  Diagnosis Date  . COPD (chronic obstructive pulmonary disease)   . Hypertrophic obstructive cardiomyopathy     Echo 10/10 EF 65-70% SW 1.6 PW 1.4 mild SAM no LVOT gradient at rest. Valsalva not performed Mild MR. No hyperenhance ment on MRI EF 69%.  . Coronary atherosclerosis of native coronary artery     Cath 1/11: RCA 50-60 ostial o/w normal. EF 70%.   . Pulmonary hypertension     Cath 1/11 RA 8, RV 43/6/14, PA 42/17 (29) PCWP 15 Fick  5.0/2.6. PVR 2.8    . Edema   . Occlusion and stenosis of carotid artery     s/p L CEA u/s 3/11. R 60-79%; L 40-59%. no change since 3/10  . Peripheral vascular disease, unspecified   . Hypertension   . Emphysema   . Asthma   . Hypothyroidism   . Allergic rhinitis   . Gastric AVM   . Heart murmur   . Anginal pain    "pressure"  . Emphysema   . Oxygen dependent 04/23/2012    "concentrated at night; liquid during day"  . Dyspnea 04/23/2012    "all the time"  . Anemia 04/23/2012    "severe @ times; think caused by colonic AVMs"  . History of blood transfusion     "many times"  . H/O hiatal hernia   . Stroke ~ 2004    denies residual on 04/23/2012  . Arthritis     "probably"  . Breast cancer   . Pneumonia     Medications:  Anti-infectives   Start     Dose/Rate Route Frequency Ordered Stop   01/14/14 2245  vancomycin (VANCOCIN) IVPB 750 mg/150 ml premix     750 mg 150 mL/hr over 60 Minutes Intravenous 2 times daily 01/14/14 2239     01/14/14 2245  piperacillin-tazobactam (ZOSYN) IVPB 3.375 g     3.375 g 12.5 mL/hr over 240 Minutes Intravenous 3 times per day 01/14/14 2239       Assessment: Patient with sepsis.  First dose of antibiotics already sent.  Goal of Therapy:  Vancomycin trough level 15-20 mcg/ml  Plan:  Measure antibiotic drug levels at steady state Follow up culture results Vancomycin 750mg  iv q12hr Zosyn 3.375g IV Q8H infused over 4hrs.   Shea Stakes [place]. 01/15/2014,6:26 AM

## 2014-01-15 NOTE — Progress Notes (Signed)
[facility] PROGRESS NOTE  [name] PJA:[id number] DOB: 08/31/1933 DOA: 01/14/2014 PCP: Evalee Mutton, CCT  Brief narrative: Pt is 80 yo female recently discharged after hospitalization for GI bleed secondary to diverticulosis. Discharged and felt well but now came back 01/14/2014 with sudden onset of weakness and fevers T max 101 F. Pt also reported chest congestion with cough, productive of clear sputum, no specific abdominal or urinary concerns. Her CXR showed possible pneumonia at bases and UA showed small leukocytes. She was started on broad spectrum abx, vanco and zosyn.  Assessment and Plan:   Active Problems:  Acute respiratory failure wit hypoxia / fever - likely pneumonia and UTI - follow up urine and blood culture results - continue vanco and zosyn for now - continue IV fluids for next 24 hours  Community acquired pneumonia - continue vanco and zosyn - follow up blood culture results - strep pneumo negative - legionella urine pending  UTI - continue current abx - follow up urine culture Hypokalemia - repleted - follow up BBMP in am  Code Status: Full  Family Communication: Family not at the bedside this am Disposition Plan: home when sable    Robbie Lis, MD  [facility] Pager [phone]  If 7PM-7AM, please contact night-coverage [email or web address] Password [email or web address] 01/15/2014, 4:58 PM   LOS: 1 day   Consultants:  None   Procedures:  None   Antibiotics:  Vanco 01/15/2104 -->  Zosyn 01/14/2014 -->  HPI/Subjective: Feels little better this am.  Objective: Filed Vitals:   01/14/14 2339 01/15/14 0454 01/15/14 0905 01/15/14 1427  BP:  117/46  117/56  Pulse:  85  86  Temp:  98.8 F (37.1 C)  98.8 F (37.1 C)  TempSrc:  Oral  Oral  Resp:  18  18  Height:      Weight:      SpO2: 93% 99% 90% 96%    Intake/Output Summary (Last 24 hours) at 01/15/14 1658 Last data filed at 01/15/14 0456  Gross per 24 hour  Intake    240 ml  Output       0 ml  Net    240 ml    Exam:   General:  Pt is alert, follows commands appropriately, not in acute distress  Cardiovascular: Regular rate and rhythm, S1/S2, no murmurs, no rubs, no gallops  Respiratory: coarse breath sounds at bases, no wheezing   Abdomen: Soft, non tender, non distended, bowel sounds present, no guarding  Extremities: No edema, pulses DP and PT palpable bilaterally  Neuro: Grossly nonfocal  Data Reviewed: Basic Metabolic Panel:  Recent Labs Lab 01/10/14 1439 01/11/14 0450 01/14/14 2320 01/15/14 0455  NA 139 142 143 145  K 3.7 3.9 3.8 3.4*  CL 98 103 101 104  CO2 31 31 33* 34*  GLUCOSE 162* 116* 143* 123*  BUN 22 19 14 11   CREATININE 0.65 0.61 0.56 0.60  CALCIUM 9.4 8.6 8.5 8.3*  MG  --   --  2.2  --   PHOS  --   --  2.5  --    Liver Function Tests:  Recent Labs Lab 01/10/14 1439 01/14/14 2320  AST 18 15  ALT 10 10  ALKPHOS 78 79  BILITOT 0.2* 0.3  PROT 7.1 6.5  ALBUMIN 3.5 3.0*   No results found for this basename: LIPASE, AMYLASE,  in the last 168 hours No results found for this basename: AMMONIA,  in the last 168 hours CBC:  Recent Labs Lab 01/10/14  1439  01/11/14 0450 01/11/14 1325 01/12/14 0335 01/14/14 2320 01/15/14 0455  WBC 9.8  < > 8.6 10.4 8.5 10.6* 8.7  NEUTROABS 6.0  --   --   --   --   --   --   HGB 9.6*  < > 8.5* 8.7* 9.1* 8.3* 8.1*  HCT 29.9*  < > 27.5* 26.8* 28.7* 26.4* 26.1*  MCV 92.3  < > 94.2 92.1 92.6 94.6 94.9  PLT 234  < > 201 213 233 276 274  < > = values in this interval not displayed. Cardiac Enzymes:  Recent Labs Lab 01/14/14 2320 01/15/14 0455 01/15/14 1005  TROPONINI <0.30 <0.30 <0.30   BNP: No components found with this basename: POCBNP,  CBG: No results found for this basename: GLUCAP,  in the last 168 hours  No results found for this or any previous visit (from the past 240 hour(s)).   Studies: Portable Chest 1 View 01/15/2014    IMPRESSION: Chronic interstitial  markings/emphysematous changes.  Mild left basilar opacity, likely atelectasis.    Scheduled Meds: . aspirin EC  81 mg Oral Daily  . budesonide-formoterol  2 puff Inhalation BID  . fesoterodine  4 mg Oral Daily  . levothyroxine  150 mcg Oral QAC breakfast  . pantoprazole  40 mg Oral Daily  . piperacillin-tazobactam   3.375 g Intravenous 3 times per day  . tiotropium  18 mcg Inhalation Daily  . vancomycin  750 mg Intravenous BID   Continuous Infusions: . sodium chloride 75 mL/hr at 01/15/14 1629

## 2014-01-16 ENCOUNTER — Inpatient Hospital Stay (HOSPITAL_COMMUNITY): Payer: Medicare Other

## 2014-01-16 DIAGNOSIS — J309 Allergic rhinitis, unspecified: Secondary | ICD-10-CM

## 2014-01-16 LAB — BASIC METABOLIC PANEL
BUN: 10 mg/dL (ref 6–23)
CALCIUM: 8.4 mg/dL (ref 8.4–10.5)
CO2: 30 mEq/L (ref 19–32)
Chloride: 101 mEq/L (ref 96–112)
Creatinine, Ser: 0.69 mg/dL (ref 0.50–1.10)
GFR calc Af Amer: >90 mL/min (ref >90)
GFR, EST NON AFRICAN AMERICAN: 81 mL/min — AB (ref >90)
GLUCOSE: 145 mg/dL — AB (ref 70–99)
POTASSIUM: 3.6 meq/L — AB (ref 3.7–5.3)
Sodium: 141 mEq/L (ref 137–147)

## 2014-01-16 LAB — CBC
HEMATOCRIT: 28.3 % — AB (ref 36.0–46.0)
HEMOGLOBIN: 8.9 g/dL — AB (ref 12.0–15.0)
MCH: 29.4 pg (ref 26.0–34.0)
MCHC: 31.4 g/dL (ref 30.0–36.0)
MCV: 93.4 fL (ref 78.0–100.0)
Platelets: 317 10*3/uL (ref 150–400)
RBC: 3.03 MIL/uL — ABNORMAL LOW (ref 3.87–5.11)
RDW: 14.2 % (ref 11.5–15.5)
WBC: 11.4 10*3/uL — ABNORMAL HIGH (ref 4.0–10.5)

## 2014-01-16 LAB — VANCOMYCIN, TROUGH: Vancomycin Tr: 8.7 ug/mL — ABNORMAL LOW (ref 10.0–20.0)

## 2014-01-16 LAB — PROCALCITONIN: Procalcitonin: <0.1 ng/mL

## 2014-01-16 MED ORDER — LORAZEPAM 2 MG/ML IJ SOLN: 1.0000 mg | Freq: Two times a day (BID) | INTRAMUSCULAR | Status: DC | PRN

## 2014-01-16 MED ORDER — POTASSIUM CHLORIDE CRYS ER 20 MEQ PO TBCR
40.0000 meq | EXTENDED_RELEASE_TABLET | Freq: Once | ORAL | Status: AC
  Administered 2014-01-16: 40 meq via ORAL
  Filled 2014-01-16: qty 2

## 2014-01-16 MED ORDER — FUROSEMIDE 10 MG/ML IJ SOLN
40.0000 mg | Freq: Once | INTRAMUSCULAR | Status: AC
  Administered 2014-01-16: 40 mg via INTRAVENOUS
  Filled 2014-01-16: qty 4

## 2014-01-16 MED ORDER — FUROSEMIDE 10 MG/ML IJ SOLN
40.0000 mg | Freq: Every day | INTRAMUSCULAR | Status: DC
  Filled 2014-01-16: qty 4

## 2014-01-16 NOTE — Progress Notes (Signed)
ANTIBIOTIC CONSULT NOTE - Follow UP  Pharmacy Consult for Vancomycin and Zosyn  Indication: HAP  Allergies  Allergen Reactions  . Latex Hives and Itching    Patient Measurements: Height: 5\' 6"  (167.6 cm) Weight: 162 lb 12.8 oz (73.846 kg) IBW/kg (Calculated) : 59.3 Adjusted Body Weight:   Vital Signs: Temp: 99.5 F (37.5 C) (05/03 0404) Temp src: Oral (05/03 0404) BP: 136/66 mmHg (05/03 0404) Pulse Rate: 115 (05/03 0404) Intake/Output from previous day: 05/02 0701 - 05/03 0700 In: 1250 [P.O.:600; I.V.:650] Out: 200 [Urine:200] Intake/Output from this shift: Total I/O In: -  Out: 400 [Urine:400]  Labs:  Recent Labs  01/14/14 2320 01/15/14 0455 01/16/14 0403  WBC 10.6* 8.7 11.4*  HGB 8.3* 8.1* 8.9*  PLT 276 274 317  CREATININE 0.56 0.60 0.69   Estimated Creatinine Clearance: 58.6 ml/min (by C-G formula based on Cr of 0.69). No results found for this basename: VANCOTROUGH, VANCOPEAK, VANCORANDOM, GENTTROUGH, GENTPEAK, GENTRANDOM, TOBRATROUGH, TOBRAPEAK, TOBRARND, AMIKACINPEAK, AMIKACINTROU, AMIKACIN,  in the last 72 hours   Microbiology: No results found for this or any previous visit (from the past 720 hour(s)).  Medical History: Past Medical History  Diagnosis Date  . COPD (chronic obstructive pulmonary disease)   . Hypertrophic obstructive cardiomyopathy     Echo 10/10 EF 65-70% SW 1.6 PW 1.4 mild SAM no LVOT gradient at rest. Valsalva not performed Mild MR. No hyperenhance ment on MRI EF 69%.  . Coronary atherosclerosis of native coronary artery     Cath 1/11: RCA 50-60 ostial o/w normal. EF 70%.   . Pulmonary hypertension     Cath 1/11 RA 8, RV 43/6/14, PA 42/17 (29) PCWP 15 Fick  5.0/2.6. PVR 2.8    . Edema   . Occlusion and stenosis of carotid artery     s/p L CEA u/s 3/11. R 60-79%; L 40-59%. no change since 3/10  . Peripheral vascular disease, unspecified   . Hypertension   . Emphysema   . Asthma   . Hypothyroidism   . Allergic rhinitis   .  Gastric AVM   . Heart murmur   . Anginal pain     "pressure"  . Emphysema   . Oxygen dependent 04/23/2012    "concentrated at night; liquid during day"  . Dyspnea 04/23/2012    "all the time"  . Anemia 04/23/2012    "severe @ times; think caused by colonic AVMs"  . History of blood transfusion     "many times"  . H/O hiatal hernia   . Stroke ~ 2004    denies residual on 04/23/2012  . Arthritis     "probably"  . Breast cancer   . Pneumonia     Medications:  Anti-infectives   Start     Dose/Rate Route Frequency Ordered Stop   01/14/14 2245  vancomycin (VANCOCIN) IVPB 750 mg/150 ml premix     750 mg 150 mL/hr over 60 Minutes Intravenous 2 times daily 01/14/14 2239     01/14/14 2245  piperacillin-tazobactam (ZOSYN) IVPB 3.375 g     3.375 g 12.5 mL/hr over 240 Minutes Intravenous 3 times per day 01/14/14 2239       Assessment: 80 yo female presented to ER with fever after recent discharge for GIB secondary to diverticulosis started on broad spectrum abx's for possible sepsis and very likely HAP.   Day #3 Vancomycin 750mg  q12/Zosyn 3.375g IV q8 (extended interval infusion)  Afebrile  WBC up slightly  Renal function stable   Goal of  Therapy:  Vancomycin trough level 15-20 mcg/ml  Plan:  1) Continue current vancomycin and zosyn doses for now 2) Check vancomycin trough tonight which will be prior to 5th dose   Adrian Saran, PharmD, BCPS Pager [phone] 01/16/2014 1:43 PM

## 2014-01-16 NOTE — Progress Notes (Signed)
Patient ID: [name], female   DOB: 08/31/1933, 80 y.o.   MRN: [id number]  [facility] PROGRESS NOTE  Shamyra Farias GHW:[id number] DOB: Apr 23, 1933 DOA: 01/14/2014 PCP: Evalee Mutton, CCT  Brief narrative: Pt is 80 yo female recently discharged after hospitalization for GI bleed secondary to diverticulosis. Discharged and felt well but now came back 01/14/2014 with sudden onset of weakness and fevers T max 101 F. Pt also reported chest congestion with cough, productive of clear sputum, no specific abdominal or urinary concerns. Her CXR showed possible pneumonia at bases and UA showed small leukocytes. She was started on broad spectrum abx, vanco and zosyn.   Assessment and Plan:  Active Problems:  Acute respiratory failure wit hypoxia / fever  - likely pneumonia - follow up blood culture results  - continue vanco and zosyn for now day #2 - stop IVF due to mild congestion on exam this AM, give one dose of Lasix 40 mg IV - repeat CXR for follow up Community acquired pneumonia  - continue vanco and zosyn as noted above  - follow up blood culture results  - strep pneumo negative  - legionella urine pending  UTI  - continue current abx  - follow up urine culture  Hypokalemia  - continue to supplement as indicated  - follow up BMP in am  Anemia of chronic disease - no signs of active bleeding, repeat CBC in AM  Code Status: Full  Family Communication: Family not at the bedside this am, daughter in law over the phone   Disposition Plan: home when sable   Consultants:  None Procedures/Studies: CXR  01/15/2014  Chronic interstitial markings/emphysematous changes.  Mild left basilar opacity, likely atelectasis.    Antibiotics:  Vancomycin 5/2 -->  Zosyn 5/2 -->   Code Status: Full Family Communication: Pt at bedside Disposition Plan: Home when medically stable  HPI/Subjective: No events overnight.   Objective: Filed Vitals:   01/16/14 0305 01/16/14 0404 01/16/14 0657  01/16/14 0948  BP:  136/66    Pulse:  115    Temp:  99.5 F (37.5 C)    TempSrc:  Oral    Resp:  20    Height:      Weight:   73.846 kg (162 lb 12.8 oz)   SpO2: 93% 95%  94%    Intake/Output Summary (Last 24 hours) at 01/16/14 1231 Last data filed at 01/16/14 0900  Gross per 24 hour  Intake   1250 ml  Output    400 ml  Net    850 ml    Exam:   General:  Pt is alert, follows commands appropriately, not in acute distress  Cardiovascular: Regular rate and rhythm, S1/S2, no murmurs, no rubs, no gallops  Respiratory: Course breath sounds bilaterally with crackles at bases   Abdomen: Soft, non tender, non distended, bowel sounds present, no guarding  Extremities: No edema, pulses DP and PT palpable bilaterally  Neuro: Grossly nonfocal  Data Reviewed: Basic Metabolic Panel:  Recent Labs Lab 01/10/14 1439 01/11/14 0450 01/14/14 2320 01/15/14 0455 01/16/14 0403  NA 139 142 143 145 141  K 3.7 3.9 3.8 3.4* 3.6*  CL 98 103 101 104 101  CO2 31 31 33* 34* 30  GLUCOSE 162* 116* 143* 123* 145*  BUN 22 19 14 11 10   CREATININE 0.65 0.61 0.56 0.60 0.69  CALCIUM 9.4 8.6 8.5 8.3* 8.4  MG  --   --  2.2  --   --   PHOS  --   --  2.5  --   --    Liver Function Tests:  Recent Labs Lab 01/10/14 1439 01/14/14 2320  AST 18 15  ALT 10 10  ALKPHOS 78 79  BILITOT 0.2* 0.3  PROT 7.1 6.5  ALBUMIN 3.5 3.0*   CBC:  Recent Labs Lab 01/10/14 1439  01/11/14 1325 01/12/14 0335 01/14/14 2320 01/15/14 0455 01/16/14 0403  WBC 9.8  < > 10.4 8.5 10.6* 8.7 11.4*  NEUTROABS 6.0  --   --   --   --   --   --   HGB 9.6*  < > 8.7* 9.1* 8.3* 8.1* 8.9*  HCT 29.9*  < > 26.8* 28.7* 26.4* 26.1* 28.3*  MCV 92.3  < > 92.1 92.6 94.6 94.9 93.4  PLT 234  < > 213 233 276 274 317  < > = values in this interval not displayed. Cardiac Enzymes:  Recent Labs Lab 01/14/14 2320 01/15/14 0455 01/15/14 1005  TROPONINI <0.30 <0.30 <0.30   Scheduled Meds: . antiseptic oral rinse  15 mL Mouth  Rinse BID  . aspirin EC  81 mg Oral Daily  . budesonide-formoterol  2 puff Inhalation BID  . fesoterodine  4 mg Oral Daily  . furosemide  40 mg Intravenous Once  . levothyroxine  150 mcg Oral QAC breakfast  . pantoprazole  40 mg Oral Daily  . piperacillin-tazobactam (ZOSYN)  IV  3.375 g Intravenous 3 times per day  . potassium chloride  40 mEq Oral Once  . tiotropium  18 mcg Inhalation Daily  . vancomycin  750 mg Intravenous BID   Continuous Infusions:    Theodis Blaze, MD  [facility] Pager [phone]  If 7PM-7AM, please contact night-coverage [email or web address] Password [email or web address] 01/16/2014, 12:31 PM   LOS: 2 days

## 2014-01-17 DIAGNOSIS — I359 Nonrheumatic aortic valve disorder, unspecified: Secondary | ICD-10-CM

## 2014-01-17 LAB — CBC
HCT: 29.8 % — ABNORMAL LOW (ref 36.0–46.0)
Hemoglobin: 9.2 g/dL — ABNORMAL LOW (ref 12.0–15.0)
MCH: 28.8 pg (ref 26.0–34.0)
MCHC: 30.9 g/dL (ref 30.0–36.0)
MCV: 93.4 fL (ref 78.0–100.0)
Platelets: 341 10*3/uL (ref 150–400)
RBC: 3.19 MIL/uL — AB (ref 3.87–5.11)
RDW: 14 % (ref 11.5–15.5)
WBC: 9.1 10*3/uL (ref 4.0–10.5)

## 2014-01-17 LAB — BASIC METABOLIC PANEL
BUN: 8 mg/dL (ref 6–23)
CALCIUM: 8.6 mg/dL (ref 8.4–10.5)
CO2: 34 meq/L — AB (ref 19–32)
CREATININE: 0.62 mg/dL (ref 0.50–1.10)
Chloride: 98 mEq/L (ref 96–112)
GFR calc Af Amer: >90 mL/min (ref >90)
GFR calc non Af Amer: 84 mL/min — ABNORMAL LOW (ref >90)
Glucose, Bld: 121 mg/dL — ABNORMAL HIGH (ref 70–99)
Potassium: 3.5 mEq/L — ABNORMAL LOW (ref 3.7–5.3)
Sodium: 142 mEq/L (ref 137–147)

## 2014-01-17 LAB — URINE CULTURE
COLONY COUNT: NO GROWTH
CULTURE: NO GROWTH

## 2014-01-17 LAB — PRO B NATRIURETIC PEPTIDE: PRO B NATRI PEPTIDE: 1120 pg/mL — AB (ref 0–450)

## 2014-01-17 MED ORDER — FUROSEMIDE 80 MG PO TABS
80.0000 mg | ORAL_TABLET | Freq: Every day | ORAL | Status: DC
  Administered 2014-01-17: 80 mg via ORAL
  Filled 2014-01-17 (×2): qty 1

## 2014-01-17 MED ORDER — VANCOMYCIN HCL IN DEXTROSE 1-5 GM/200ML-% IV SOLN
1000.0000 mg | Freq: Two times a day (BID) | INTRAVENOUS | Status: DC
  Administered 2014-01-17: 1000 mg via INTRAVENOUS
  Filled 2014-01-17: qty 200

## 2014-01-17 MED ORDER — POTASSIUM CHLORIDE CRYS ER 20 MEQ PO TBCR
40.0000 meq | EXTENDED_RELEASE_TABLET | Freq: Once | ORAL | Status: AC
  Administered 2014-01-17: 40 meq via ORAL
  Filled 2014-01-17: qty 2

## 2014-01-17 MED ORDER — VANCOMYCIN HCL 10 G IV SOLR
1250.0000 mg | Freq: Two times a day (BID) | INTRAVENOUS | Status: DC
  Filled 2014-01-17: qty 1250

## 2014-01-17 MED ORDER — LEVOFLOXACIN 500 MG PO TABS
500.0000 mg | ORAL_TABLET | Freq: Every day | ORAL | Status: DC
  Administered 2014-01-17 – 2014-01-18 (×2): 500 mg via ORAL
  Filled 2014-01-17 (×3): qty 1

## 2014-01-17 NOTE — Progress Notes (Signed)
  Echocardiogram 2D Echocardiogram has been performed.  [name] 01/17/2014, 4:34 PM

## 2014-01-17 NOTE — Evaluation (Signed)
Physical Therapy Evaluation Patient Details Name: [name] MRN: [id number] DOB: [birth year] Today's Date: 01/17/2014   History of Present Illness  80 yo female recently discharged after hospitalization for GI bleed secondary to diverticulosis and readmitted 5/1 for acute respiratory failure with hypoxia/fever and CAP with PMHx of arthritis, breast cancer, CVA, oxygen dependent, dyspnea, anemia, PVD, HTN.  Clinical Impression  Pt admitted for acute respiratory failure with hypoxia.  Pt currently with functional limitations due to the deficits listed below (see PT Problem List).  Pt desaturated with activity, therefore ambulated on 3L O2 with RW.  Pt agreeable to try progressing to ambulation with cane during next visit to reach her baseline.  Pt complained of R lateral knee pain reporting she may have twisted it getting in or out of the bed.  Tender to palpation at joint line with diffuse pain over lateral knee.  (-) valgus stress test, (+) McMurray's test for lateral meniscus.  Pt will benefit from skilled PT to increase their independence and safety with mobility to allow discharge.    Follow Up Recommendations No PT follow up    Equipment Recommendations  None recommended by PT    Recommendations for Other Services       Precautions / Restrictions Precautions Precautions: Fall Precaution Comments: O2 dependent      Mobility  Bed Mobility Overal bed mobility: Needs Assistance Bed Mobility: Supine to Sit     Supine to sit: Min assist;HOB elevated     General bed mobility comments: pt used therapist hand to pull trunk upright, verbal cues for repositioning  Transfers Overall transfer level: Needs assistance Equipment used: 1 person hand held assist;Rolling walker (2 wheeled) Transfers: Sit to/from Stand Sit to Stand: Min assist         General transfer comment: verbal cues for sequence and safety; SpO2 on RA upon standing 84%, applied 3L O2, SpO2 recovered  >90%  Ambulation/Gait Ambulation/Gait assistance: Min guard Ambulation Distance (Feet): 120 Feet Assistive device: Rolling walker (2 wheeled) Gait Pattern/deviations: Step-through pattern Gait velocity: decreased   General Gait Details: complained of R knee pain during flexion of ambulation; pt had difficulty steering RW but reported she was feeling steady on her feet and the RW was pulling to the right, ambulated on 3L O2, SpO2 95%  Stairs            Wheelchair Mobility    Modified Rankin (Stroke Patients Only)       Balance                                             Pertinent Vitals/Pain SpO2 on RA at rest: 90% SpO2 on RA upon standing: 84% SpO2 on 3L O2 standing: 91% SpO2 on 3L O2 ambulating: 95% Activity to tolerance.  Applied ice to R lateral knee.    Home Living Family/patient expects to be discharged to:: Private residence Living Arrangements: Other relatives (sister) Available Help at Discharge: Family (brother next door) Type of Home: House Home Access: Stairs to enter Entrance Stairs-Rails: Right;Left;Can reach both Entrance Stairs-Number of Steps: 2 Home Layout: One level Home Equipment: [place] - 4 wheels;Cane - single point      Prior Function           Comments: uses 3L O2 at night, uses O2 as needed during the day     Hand Dominance  Extremity/Trunk Assessment               Lower Extremity Assessment: RLE deficits/detail;Generalized weakness RLE Deficits / Details: c/o lateral knee pain with flexion and during ambulation; valgus stress test (-), McMurray's test for lateral meniscus (+)    Cervical / Trunk Assessment: Normal  Communication   Communication: No difficulties  Cognition Arousal/Alertness: Awake/alert Behavior During Therapy: WFL for tasks assessed/performed Overall Cognitive Status: Within Functional Limits for tasks assessed                      General Comments       Exercises        Assessment/Plan    PT Assessment Patient needs continued PT services  PT Diagnosis Difficulty walking   PT Problem List Decreased strength;Decreased range of motion;Decreased activity tolerance;Decreased balance;Decreased mobility;Decreased coordination;Decreased knowledge of use of DME  PT Treatment Interventions DME instruction;Gait training;Stair training;Functional mobility training;Therapeutic activities;Therapeutic exercise;Balance training;Patient/family education   PT Goals (Current goals can be found in the Care Plan section) Acute Rehab PT Goals Patient Stated Goal: be independently mobile PT Goal Formulation: With patient Time For Goal Achievement: 01/31/14 Potential to Achieve Goals: Good    Frequency Min 3X/week   Barriers to discharge        Co-evaluation               End of Session Equipment Utilized During Treatment: Gait belt;Oxygen Activity Tolerance: Patient tolerated treatment well Patient left: in chair;with call bell/phone within reach           Time: 1036-1055 PT Time Calculation (min): 19 min   Charges:   PT Evaluation $Initial PT Evaluation Tier I: 1 Procedure PT Treatments $Gait Training: 8-22 mins   PT G CodesJacqulyn Cane 02/01/2014, 12:11 PM Jacqulyn Cane SPT 01-Feb-2014

## 2014-01-17 NOTE — Progress Notes (Signed)
Patient ID: [name], female   DOB: 03-09-1933, 80 y.o.   MRN: [id number]  [facility] PROGRESS NOTE  [name] WCB:[id number] DOB: 1932-11-25 DOA: 01/14/2014 PCP: [name], CCT  Brief narrative:  Pt is 80 yo female recently discharged after hospitalization for GI bleed secondary to diverticulosis. Discharged and felt well but now came back 01/14/2014 with sudden onset of weakness and fevers T max 101 F. Pt also reported chest congestion with cough, productive of clear sputum, no specific abdominal or urinary concerns. Her CXR showed possible pneumonia at bases and UA showed small leukocytes. She was started on broad spectrum abx, vanco and zosyn.   Assessment and Plan:  Active Problems:  Acute respiratory failure wit hypoxia / fever  - likely pneumonia  - follow up blood culture results  - continue vanco and zosyn for now day #3, transition to oral Levaquin  - transition to oral Lasix today - weight trending down 163 lbs --> 161 lbs this AM ? CHF - weight trending down as noted above - transition to oral Lasix today - 2 D ECHO pending  Community acquired pneumonia  - transition to oral Levaquin today  - WBC trending down - strep pneumo negative  - legionella urine negative  UTI  - continue ABX as noted above  - urine culture with no growth  Hypokalemia  - continue to supplement as indicated  - follow up BMP in am  Anemia of chronic disease  - no signs of active bleeding, repeat CBC in AM   Code Status: Full  Family Communication: Family not at the bedside this am, daughter in law over the phone  Disposition Plan: home when sable   Consultants:  None Procedures/Studies:  CXR 01/15/2014 Chronic interstitial markings/emphysematous changes. Mild left basilar opacity, likely atelectasis.  Antibiotics:  Vancomycin 5/2 --> 5/4 Zosyn 5/2 --> 5/4 Levaquin 5/4 -->  Code Status: Full  Family Communication: Pt at bedside  Disposition Plan: Home when medically  stable  HPI/Subjective: No events overnight.   Objective: Filed Vitals:   01/16/14 0948 01/16/14 1952 01/16/14 2110 01/17/14 0524  BP:   92/54 121/60  Pulse:   100 90  Temp:   98.5 F (36.9 C) 99.7 F (37.6 C)  TempSrc:   Oral Oral  Resp:   18 16  Height:      Weight:    73.029 kg (161 lb)  SpO2: 94% 94% 92% 93%    Intake/Output Summary (Last 24 hours) at 01/17/14 0840 Last data filed at 01/16/14 2137  Gross per 24 hour  Intake    900 ml  Output   1650 ml  Net   -750 ml    Exam:   General:  Pt is alert, follows commands appropriately, not in acute distress  Cardiovascular: Regular rate and rhythm, S1/S2, SEM 4/6  Respiratory: Clear to auscultation bilaterally, no wheezing, bibasilar crackles   Abdomen: Soft, non tender, non distended, bowel sounds present, no guarding  Extremities: No edema, pulses DP and PT palpable bilaterally  Neuro: Grossly nonfocal  Data Reviewed: Basic Metabolic Panel:  Recent Labs Lab 01/11/14 0450 01/14/14 2320 01/15/14 0455 01/16/14 0403 01/17/14 0315  NA 142 143 145 141 142  K 3.9 3.8 3.4* 3.6* 3.5*  CL 103 101 104 101 98  CO2 31 33* 34* 30 34*  GLUCOSE 116* 143* 123* 145* 121*  BUN 19 14 11 10 8   CREATININE 0.61 0.56 0.60 0.69 0.62  CALCIUM 8.6 8.5 8.3* 8.4 8.6  MG  --  2.2  --   --   --   PHOS  --  2.5  --   --   --    Liver Function Tests:  Recent Labs Lab 01/10/14 1439 01/14/14 2320  AST 18 15  ALT 10 10  ALKPHOS 78 79  BILITOT 0.2* 0.3  PROT 7.1 6.5  ALBUMIN 3.5 3.0*   CBC:  Recent Labs Lab 01/10/14 1439  01/12/14 0335 01/14/14 2320 01/15/14 0455 01/16/14 0403 01/17/14 0315  WBC 9.8  < > 8.5 10.6* 8.7 11.4* 9.1  NEUTROABS 6.0  --   --   --   --   --   --   HGB 9.6*  < > 9.1* 8.3* 8.1* 8.9* 9.2*  HCT 29.9*  < > 28.7* 26.4* 26.1* 28.3* 29.8*  MCV 92.3  < > 92.6 94.6 94.9 93.4 93.4  PLT 234  < > 233 276 274 317 341  < > = values in this interval not displayed. Cardiac Enzymes:  Recent  Labs Lab 01/14/14 2320 01/15/14 0455 01/15/14 1005  TROPONINI <0.30 <0.30 <0.30   Recent Results (from the past 240 hour(s))  CULTURE, BLOOD (ROUTINE X 2)     Status: None   Collection Time    01/14/14 11:20 PM      Result Value Ref Range Status   Specimen Description BLOOD LEFT ANTECUBITAL   Final   Special Requests BOTTLES DRAWN AEROBIC ONLY 10CC   Final   Culture  Setup Time     Final   Value: 01/15/2014 03:30     Performed at [facility]   Culture     Final   Value:        BLOOD CULTURE RECEIVED NO GROWTH TO DATE CULTURE WILL BE HELD FOR 5 DAYS BEFORE ISSUING A FINAL NEGATIVE REPORT     Performed at [facility]   Report Status PENDING   Incomplete  CULTURE, BLOOD (ROUTINE X 2)     Status: None   Collection Time    01/14/14 11:30 PM      Result Value Ref Range Status   Specimen Description BLOOD LEFT HAND   Final   Special Requests BOTTLES DRAWN AEROBIC ONLY 4CC   Final   Culture  Setup Time     Final   Value: 01/15/2014 08:01     Performed at [facility]   Culture     Final   Value:        BLOOD CULTURE RECEIVED NO GROWTH TO DATE CULTURE WILL BE HELD FOR 5 DAYS BEFORE ISSUING A FINAL NEGATIVE REPORT     Performed at [facility]   Report Status PENDING   Incomplete  URINE CULTURE     Status: None   Collection Time    01/14/14 11:53 PM      Result Value Ref Range Status   Specimen Description URINE, CLEAN CATCH   Final   Special Requests NONE   Final   Culture  Setup Time     Final   Value: 01/15/2014 03:28     Performed at [place]     Final   Value: NO GROWTH     Performed at [facility]   Culture     Final   Value: NO GROWTH     Performed at [facility]   Report Status 01/17/2014 FINAL   Final     Scheduled Meds: .  antiseptic oral rinse  15 mL Mouth Rinse BID  . aspirin EC  81 mg Oral Daily  . budesonide-formoterol  2 puff Inhalation BID  . fesoterodine  4 mg Oral Daily  .  furosemide  40 mg Intravenous Daily  . levothyroxine  150 mcg Oral QAC breakfast  . pantoprazole  40 mg Oral Daily  . piperacillin-tazobactam (ZOSYN)  IV  3.375 g Intravenous 3 times per day  . tiotropium  18 mcg Inhalation Daily  . vancomycin  1,250 mg Intravenous Q12H   Continuous Infusions:    Victoria Blaze, MD  [facility] Pager [phone]  If 7PM-7AM, please contact night-coverage [email or web address] Password [email or web address] 01/17/2014, 8:40 AM   LOS: 3 days

## 2014-01-17 NOTE — Evaluation (Signed)
I have reviewed this note and agree with all findings. Kati Starling Jessie, PT, DPT Pager: [phone]   

## 2014-01-17 NOTE — Progress Notes (Signed)
ANTIBIOTIC CONSULT NOTE - FOLLOW UP  Pharmacy Consult for Vancomycin and Zosyn Indication: HAP  Allergies  Allergen Reactions  . Latex Hives and Itching    Patient Measurements: Height: 5\' 6"  (167.6 cm) Weight: 161 lb (73.029 kg) IBW/kg (Calculated) : 59.3   Recent Labs  01/16/14 2141  VANCOTROUGH 8.7*     Assessment: Patient with low vancomycin level after prior doses charted correctly.  Originally increased to 1g q12h, will further increase with worsening bilateral effusions seen on CXR on 5/3.  D4 antibiotics  Patient remains afebrile  WBC improved to WNL  Renal function stable  Cultures are negative to date  Goal of Therapy:  Vancomycin trough level 15-20 mcg/ml  Plan:  Measure antibiotic drug levels at steady state Follow up culture results Change to 1250mg  iv q12hr with next dose at 1800 Continue Zosyn 3.375G IV q8h, each dose to be infused over 4 hours  Johny Drilling, PharmD, BCPS Pager: [phone] Pharmacy: [phone] 01/17/2014 8:37 AM

## 2014-01-17 NOTE — Progress Notes (Signed)
ANTIBIOTIC CONSULT NOTE - FOLLOW UP  Pharmacy Consult for Vancomycin Indication: HAP  Allergies  Allergen Reactions  . Latex Hives and Itching    Patient Measurements: Height: 5\' 6"  (167.6 cm) Weight: 162 lb 12.8 oz (73.846 kg) IBW/kg (Calculated) : 59.3 Adjusted Body Weight:   Vital Signs: Temp: 98.5 F (36.9 C) (05/03 2110) Temp src: Oral (05/03 2110) BP: 92/54 mmHg (05/03 2110) Pulse Rate: 100 (05/03 2110) Intake/Output from previous day: 05/03 0701 - 05/04 0700 In: 900 [IV Piggyback:900] Out: 9935 [Urine:1650] Intake/Output from this shift: Total I/O In: -  Out: 300 [Urine:300]  Labs:  Recent Labs  01/14/14 2320 01/15/14 0455 01/16/14 0403  WBC 10.6* 8.7 11.4*  HGB 8.3* 8.1* 8.9*  PLT 276 274 317  CREATININE 0.56 0.60 0.69   Estimated Creatinine Clearance: 58.6 ml/min (by C-G formula based on Cr of 0.69).  Recent Labs  01/16/14 2141  [place] 8.7*     Microbiology: No results found for this or any previous visit (from the past 720 hour(s)).  Anti-infectives   Start     Dose/Rate Route Frequency Ordered Stop   01/17/14 0600  vancomycin (VANCOCIN) IVPB 1000 mg/200 mL premix     1,000 mg 200 mL/hr over 60 Minutes Intravenous Every 12 hours 01/17/14 0304     01/14/14 2245  vancomycin (VANCOCIN) IVPB 750 mg/150 ml premix     750 mg 150 mL/hr over 60 Minutes Intravenous 2 times daily 01/14/14 2239     01/14/14 2245  piperacillin-tazobactam (ZOSYN) IVPB 3.375 g     3.375 g 12.5 mL/hr over 240 Minutes Intravenous 3 times per day 01/14/14 2239        Assessment: Patient with low vancomycin level after prior doses charted correctly.    Goal of Therapy:  Vancomycin trough level 15-20 mcg/ml  Plan:  Measure antibiotic drug levels at steady state Follow up culture results Change to 1gm iv q12hr with next dose at [place]. 01/17/2014,3:05 AM

## 2014-01-18 ENCOUNTER — Inpatient Hospital Stay (HOSPITAL_COMMUNITY): Payer: Medicare Other

## 2014-01-18 DIAGNOSIS — D649 Anemia, unspecified: Secondary | ICD-10-CM

## 2014-01-18 LAB — CBC
HCT: 31.5 % — ABNORMAL LOW (ref 36.0–46.0)
HEMOGLOBIN: 9.8 g/dL — AB (ref 12.0–15.0)
MCH: 28.6 pg (ref 26.0–34.0)
MCHC: 31.1 g/dL (ref 30.0–36.0)
MCV: 91.8 fL (ref 78.0–100.0)
Platelets: 408 10*3/uL — ABNORMAL HIGH (ref 150–400)
RBC: 3.43 MIL/uL — ABNORMAL LOW (ref 3.87–5.11)
RDW: 14.1 % (ref 11.5–15.5)
WBC: 10 10*3/uL (ref 4.0–10.5)

## 2014-01-18 LAB — BASIC METABOLIC PANEL
BUN: 14 mg/dL (ref 6–23)
CHLORIDE: 94 meq/L — AB (ref 96–112)
CO2: 34 mEq/L — ABNORMAL HIGH (ref 19–32)
CREATININE: 1.02 mg/dL (ref 0.50–1.10)
Calcium: 8.9 mg/dL (ref 8.4–10.5)
GFR calc non Af Amer: 51 mL/min — ABNORMAL LOW (ref >90)
GFR, EST AFRICAN AMERICAN: 59 mL/min — AB (ref >90)
Glucose, Bld: 128 mg/dL — ABNORMAL HIGH (ref 70–99)
Potassium: 3.4 mEq/L — ABNORMAL LOW (ref 3.7–5.3)
SODIUM: 140 meq/L (ref 137–147)

## 2014-01-18 LAB — PROCALCITONIN: Procalcitonin: <0.1 ng/mL

## 2014-01-18 MED ORDER — POTASSIUM CHLORIDE CRYS ER 20 MEQ PO TBCR
40.0000 meq | EXTENDED_RELEASE_TABLET | Freq: Once | ORAL | Status: AC
  Administered 2014-01-18: 40 meq via ORAL
  Filled 2014-01-18: qty 2

## 2014-01-18 MED ORDER — SENNOSIDES-DOCUSATE SODIUM 8.6-50 MG PO TABS
1.0000 | ORAL_TABLET | Freq: Two times a day (BID) | ORAL | Status: DC
  Administered 2014-01-18 – 2014-01-20 (×4): 1 via ORAL
  Filled 2014-01-18 (×5): qty 1

## 2014-01-18 MED ORDER — POLYETHYLENE GLYCOL 3350 17 G PO PACK
17.0000 g | PACK | Freq: Every day | ORAL | Status: DC
  Administered 2014-01-18 – 2014-01-20 (×3): 17 g via ORAL
  Filled 2014-01-18 (×3): qty 1

## 2014-01-18 MED ORDER — FUROSEMIDE 40 MG PO TABS
40.0000 mg | ORAL_TABLET | Freq: Every day | ORAL | Status: DC
  Administered 2014-01-18: 40 mg via ORAL
  Filled 2014-01-18 (×2): qty 1

## 2014-01-18 NOTE — Care Management Note (Addendum)
Cm spoke with patient at the bedside concerning discharge planning. CM provided pt with Medicare Important Message concerning her rights upon discharge. Pt states unable to ambulate independently. No PT follow up recommended by physical therapy, Bedside RN Butch Penny to contact PT concerning re evaluation of pt, per family request. Pt request home health services for medication management. Pt offered choice for [facility]. Per pt choice [facility] to provide [facility] services at dc. Pt has rw, bsc, and home oxygen for home dme use supplied by [facility]. Pt requires loaner tank from [facility] prior to discharge. Agency notified at [phone].    Venita Lick Johnjoseph Rolfe,MSN,RN [phone]

## 2014-01-18 NOTE — Progress Notes (Signed)
PT Cancellation Note  Patient Details Name: [name] MRN: [id number] DOB: 01/17/1933   Cancelled Treatment:    Reason Eval/Treat Not Completed: Pain limiting ability to participate;Medical issues which prohibited therapy  Multiple staff members alerted PT to see pt for updating d/c recommendations.  Discussed plan of care with pt and pt's niece.  Pt reporting increased pain today and unable to ambulate, states +2 to get to [facility] or recliner.  MRI R knee ordered so will await results prior to mobilizing further.  Discussed possible need for ST-SNF as pt now with increased pain and decreased mobility and sister is unable to assist her at home.  Will check back on pt tomorrow pending results and update d/c recommendations accordingly.   Junius Argyle 01/18/2014, 3:17 PM Carmelia Bake, PT, DPT 01/18/2014 Pager: [phone]

## 2014-01-18 NOTE — Progress Notes (Signed)
Patient ID: [name], female   DOB: [birth year], 80 y.o.   MRN: [id number]  [facility] PROGRESS NOTE  [name] GYI:[id number] DOB: [birth year] DOA: 01/14/2014 PCP: Evalee Mutton, CCT  Brief narrative:  Pt is 80 yo female recently discharged after hospitalization for GI bleed secondary to diverticulosis. Discharged and felt well but now came back 01/14/2014 with sudden onset of weakness and fevers T max 101 F. Pt also reported chest congestion with cough, productive of clear sputum, no specific abdominal or urinary concerns. Her CXR showed possible pneumonia at bases and UA showed small leukocytes. She was started on broad spectrum abx, vanco and zosyn.   Assessment and Plan:  Active Problems:  Acute respiratory failure with hypoxia / fever  - likely pneumonia  - blood culture negative to date  - continude vanco and zosyn for day #3, transitioned to oral Levaquin 5/4 and to be continued until 5/7, this will complete 7 days therapy  Acute on chronic systolic CHF  - per last 2 D ECHO EF now 30 -35%, which is down from 35 - 40% one year ago - weight trending down 163 lbs --> 161 lbs this AM  - pt started on lasix 40 mg IV and transitioned to oral lasix 80 mg po QD - pt has responded well and clinically looks better this AM - will lower the dose of Lasix to 40 mg PO QD and will continue to monitor clinical response  Right knee pain - difficulty with weight bearing - MRI right knee requested  - PT evaluation, pt will likely need SNF upon discharge  Community acquired pneumonia  - transition to oral Levaquin 5/4 and stop 5/7 - WBC trending down  - strep pneumo negative  - legionella urine negative  UTI  - continue ABX as noted above  - urine culture with no growth  Hypokalemia  - continue to supplement as indicated  - follow up BMP in am  Anemia of chronic disease  - no signs of active bleeding, repeat CBC in AM   Code Status: Full  Family Communication: Family not at the  bedside this am, daughter in law over the phone  Disposition Plan: SNF in 24 - 48 hours   Consultants:  None Procedures/Studies:  CXR 01/15/2014 Chronic interstitial markings/emphysematous changes. Mild left basilar opacity, likely atelectasis.  Antibiotics:  Vancomycin 5/2 --> 5/4  Zosyn 5/2 --> 5/4  Levaquin 5/4 --> 5/7  HPI/Subjective: No events overnight.   Objective: Filed Vitals:   01/17/14 2140 01/18/14 0425 01/18/14 0500 01/18/14 1310  BP: 107/44 109/55  111/59  Pulse: 104 106  106  Temp: 97.9 F (36.6 C) 97.9 F (36.6 C)  98.4 F (36.9 C)  TempSrc: Oral Oral  Oral  Resp: 18 21  18   Height:      Weight:   73.256 kg (161 lb 8 oz)   SpO2: 98% 93%  98%    Intake/Output Summary (Last 24 hours) at 01/18/14 1817 Last data filed at 01/18/14 1327  Gross per 24 hour  Intake    240 ml  Output   1250 ml  Net  -1010 ml    Exam:   General:  Pt is alert, follows commands appropriately, not in acute distress  Cardiovascular: Regular rhythm, tachycardia, S1/S2, no murmurs, no rubs, no gallops  Respiratory: Clear to auscultation bilaterally, no wheezing, bibasilar crackles   Abdomen: Soft, non tender, non distended, bowel sounds present, no guarding  Extremities: No edema, pulses DP and  PT palpable bilaterally  Neuro: Grossly nonfocal  Data Reviewed: Basic Metabolic Panel:  Recent Labs Lab 01/14/14 2320 01/15/14 0455 01/16/14 0403 01/17/14 0315 01/18/14 0815  NA 143 145 141 142 140  K 3.8 3.4* 3.6* 3.5* 3.4*  CL 101 104 101 98 94*  CO2 33* 34* 30 34* 34*  GLUCOSE 143* 123* 145* 121* 128*  BUN 14 11 10 8 14   CREATININE 0.56 0.60 0.69 0.62 1.02  CALCIUM 8.5 8.3* 8.4 8.6 8.9  MG 2.2  --   --   --   --   PHOS 2.5  --   --   --   --    Liver Function Tests:  Recent Labs Lab 01/14/14 2320  AST 15  ALT 10  ALKPHOS 79  BILITOT 0.3  PROT 6.5  ALBUMIN 3.0*   CBC:  Recent Labs Lab 01/14/14 2320 01/15/14 0455 01/16/14 0403 01/17/14 0315  01/18/14 0815  WBC 10.6* 8.7 11.4* 9.1 10.0  HGB 8.3* 8.1* 8.9* 9.2* 9.8*  HCT 26.4* 26.1* 28.3* 29.8* 31.5*  MCV 94.6 94.9 93.4 93.4 91.8  PLT 276 274 317 341 408*   Cardiac Enzymes:  Recent Labs Lab 01/14/14 2320 01/15/14 0455 01/15/14 1005  TROPONINI <0.30 <0.30 <0.30     Recent Results (from the past 240 hour(s))  CULTURE, BLOOD (ROUTINE X 2)     Status: None   Collection Time    01/14/14 11:20 PM      Result Value Ref Range Status   Specimen Description BLOOD LEFT ANTECUBITAL   Final   Special Requests BOTTLES DRAWN AEROBIC ONLY 10CC   Final   Culture  Setup Time     Final   Value: 01/15/2014 03:30     Performed at [facility]   Culture     Final   Value:        BLOOD CULTURE RECEIVED NO GROWTH TO DATE CULTURE WILL BE HELD FOR 5 DAYS BEFORE ISSUING A FINAL NEGATIVE REPORT     Performed at [facility]   Report Status PENDING   Incomplete  CULTURE, BLOOD (ROUTINE X 2)     Status: None   Collection Time    01/14/14 11:30 PM      Result Value Ref Range Status   Specimen Description BLOOD LEFT HAND   Final   Special Requests BOTTLES DRAWN AEROBIC ONLY 4CC   Final   Culture  Setup Time     Final   Value: 01/15/2014 08:01     Performed at [facility]   Culture     Final   Value:        BLOOD CULTURE RECEIVED NO GROWTH TO DATE CULTURE WILL BE HELD FOR 5 DAYS BEFORE ISSUING A FINAL NEGATIVE REPORT     Performed at [facility]   Report Status PENDING   Incomplete  URINE CULTURE     Status: None   Collection Time    01/14/14 11:53 PM      Result Value Ref Range Status   Specimen Description URINE, CLEAN CATCH   Final   Special Requests NONE   Final   Culture  Setup Time     Final   Value: 01/15/2014 03:28     Performed at [facility]     Final   Value: NO GROWTH     Performed at [facility]   Culture     Final   Value: NO  GROWTH     Performed at [facility]   Report Status 01/17/2014  FINAL   Final     Scheduled Meds: . antiseptic oral rinse  15 mL Mouth Rinse BID  . aspirin EC  81 mg Oral Daily  . budesonide-formoterol  2 puff Inhalation BID  . fesoterodine  4 mg Oral Daily  . furosemide  40 mg Oral Daily  . levofloxacin  500 mg Oral Daily  . levothyroxine  150 mcg Oral QAC breakfast  . pantoprazole  40 mg Oral Daily  . tiotropium  18 mcg Inhalation Daily   Continuous Infusions:   Theodis Blaze, MD  [facility] Pager [phone]  If 7PM-7AM, please contact night-coverage [email or web address] Password [email or web address] 01/18/2014, 6:17 PM   LOS: 4 days

## 2014-01-18 NOTE — Progress Notes (Signed)
Noted niece requesting inpt rehab consult. Please place order for assessment by our physician. I will follow up tomorrow. [phone]

## 2014-01-18 NOTE — Progress Notes (Signed)
OT Cancellation Note  Patient Details Name: [name] MRN: [id number] DOB: 1933/01/17   Cancelled Treatment:    Reason Eval/Treat Not Completed: Other (comment).  Noted pt to get MRI later today:  Needed A x 2 with PT to transfer due to pain.  Will check back tomorrow.  Lesle Chris 01/18/2014, 3:16 PM Lesle Chris, OTR/L [phone] 01/18/2014

## 2014-01-18 NOTE — Progress Notes (Addendum)
Clinical Social Work Department BRIEF PSYCHOSOCIAL ASSESSMENT 01/19/2014  Patient:  [name], [name]     Account Number:  [id number]     Admit date:  01/14/2014  Clinical Social Worker:  Ulyess Blossom  Date/Time:  01/18/2014 04:30 PM  Referred by:  Physician  Date Referred:  01/19/2014 Referred for  SNF Placement   Other Referral:   Interview type:  Patient Other interview type:   and patient niece via telephone    PSYCHOSOCIAL DATA Living Status:  [place] Admitted from facility:   Level of care:   Primary support name:  [name]/niece/[phone] Primary support relationship to patient:  FAMILY Degree of support available:   strong    CURRENT CONCERNS Current Concerns  Post-Acute Placement   Other Concerns:    SOCIAL WORK ASSESSMENT / PLAN CSW received notification from RN that pt and pt niece requesting to speak with CSW re: short term rehab.    CSW reviewed chart and noted that initial PT evaluation recommended no PT follow up, but since PT evaluation pt has been reporting increased pain today and unable to ambulate, states +2 to get to [facility] or recliner. Per MD, plan is for MRI R knee ordered and PT will await results prior re-evaluating pt. Per PT and MD, possible need for ST-SNF as pt now with increased pain and decreased mobility and sister is unable to assist her at home.    CSW met with pt at bedside to discuss. CSW discussed that PT will re-evaluate following MRI of the knee, but likely pt will need short term rehab. Pt is agreeable to SNF search in [facility] as pt niece lives in [place]. Pt specifically expressed interest in [facility]. Pt niece was not present at this time, but CSW left CSW contact information.    CSW received phone call from pt niece, [name]. Pt niece expressed concern about pt decline in mobility. Pt niece discussed that attending MD mentioned potential for CIR. CSW discussed that PT will evaluate pt appropriateness for CIR, but CSW  always does SNF search as secondary option in the instance that pt is not appropriate for CIR. Pt niece expressed understanding, but hopeful that pt will qualify for CIR.    CSW completed FL2 and initiated SNF search to [facility].    CSW contacted [facility] and notified facility of pt interest in facility.    CSW to follow up on re-evaluation from PT.    CSW to follow up with pt and pt niece regarding disposition plans.   Assessment/plan status:  Psychosocial Support/Ongoing Assessment of Needs Other assessment/ plan:   discharge planning   Information/referral to community resources:   [facility] list    PATIENT'S/FAMILY'S RESPONSE TO PLAN OF CARE: Pt alert and oriented x 4. Pt was initially hopeful to return home, but is having difficulty with her knee which has impacted her ambulation. Pt and pt niece hopeful for CIR, but recognize need to explore SNF rehab if CIR unable to accept pt. Pt and pt niece appreciative of CSW assistance and support.    [name], MSW, [place] Work [phone]

## 2014-01-19 ENCOUNTER — Encounter: Payer: Self-pay | Admitting: Internal Medicine

## 2014-01-19 DIAGNOSIS — J189 Pneumonia, unspecified organism: Secondary | ICD-10-CM | POA: Diagnosis present

## 2014-01-19 DIAGNOSIS — D649 Anemia, unspecified: Secondary | ICD-10-CM | POA: Diagnosis present

## 2014-01-19 DIAGNOSIS — R7989 Other specified abnormal findings of blood chemistry: Secondary | ICD-10-CM | POA: Diagnosis present

## 2014-01-19 DIAGNOSIS — I5033 Acute on chronic diastolic (congestive) heart failure: Secondary | ICD-10-CM | POA: Diagnosis present

## 2014-01-19 DIAGNOSIS — A419 Sepsis, unspecified organism: Principal | ICD-10-CM

## 2014-01-19 DIAGNOSIS — S83429A Sprain of lateral collateral ligament of unspecified knee, initial encounter: Secondary | ICD-10-CM

## 2014-01-19 DIAGNOSIS — N179 Acute kidney failure, unspecified: Secondary | ICD-10-CM | POA: Diagnosis present

## 2014-01-19 DIAGNOSIS — R5381 Other malaise: Secondary | ICD-10-CM

## 2014-01-19 LAB — CBC
HCT: 32.7 % — ABNORMAL LOW (ref 36.0–46.0)
Hemoglobin: 10.2 g/dL — ABNORMAL LOW (ref 12.0–15.0)
MCH: 28.8 pg (ref 26.0–34.0)
MCHC: 31.2 g/dL (ref 30.0–36.0)
MCV: 92.4 fL (ref 78.0–100.0)
PLATELETS: 401 10*3/uL — AB (ref 150–400)
RBC: 3.54 MIL/uL — AB (ref 3.87–5.11)
RDW: 14.1 % (ref 11.5–15.5)
WBC: 10.2 10*3/uL (ref 4.0–10.5)

## 2014-01-19 LAB — BASIC METABOLIC PANEL
BUN: 17 mg/dL (ref 6–23)
CHLORIDE: 94 meq/L — AB (ref 96–112)
CO2: 35 meq/L — AB (ref 19–32)
Calcium: 9.2 mg/dL (ref 8.4–10.5)
Creatinine, Ser: 1.37 mg/dL — ABNORMAL HIGH (ref 0.50–1.10)
GFR calc non Af Amer: 36 mL/min — ABNORMAL LOW (ref >90)
GFR, EST AFRICAN AMERICAN: 41 mL/min — AB (ref >90)
Glucose, Bld: 121 mg/dL — ABNORMAL HIGH (ref 70–99)
Potassium: 4 mEq/L (ref 3.7–5.3)
Sodium: 139 mEq/L (ref 137–147)

## 2014-01-19 MED ORDER — ATORVASTATIN CALCIUM 20 MG PO TABS
20.0000 mg | ORAL_TABLET | Freq: Every day | ORAL | Status: DC
  Administered 2014-01-19 – 2014-01-20 (×2): 20 mg via ORAL
  Filled 2014-01-19 (×2): qty 1

## 2014-01-19 MED ORDER — SALINE SPRAY 0.65 % NA SOLN
1.0000 | NASAL | Status: DC | PRN
  Filled 2014-01-19: qty 44

## 2014-01-19 MED ORDER — LEVOFLOXACIN 500 MG PO TABS
500.0000 mg | ORAL_TABLET | ORAL | Status: DC
  Administered 2014-01-20: 500 mg via ORAL
  Filled 2014-01-19: qty 1

## 2014-01-19 NOTE — Progress Notes (Signed)
Progress Note   [name] PYP:[id number] DOB: 1933/05/10 DOA: 01/14/2014 PCP: Evalee Mutton, CCT   Brief Narrative:   [name] is an 80 y.o. female with PMH of diverticular bleed status post hospitalization 01/10/14-01/12/14, readmitted 01/14/14 chief complaint of the sudden onset of weakness and fever, chest congestion, cough with clear sputum. Initial evaluation in the ED showed possible pneumonia based on chest radiography and urinalysis with possible signs of infection.  Assessment/Plan:   Principal Problem:   Sepsis with fever secondary to healthcare associated pneumonia  Patient met the criteria for sepsis with tachycardia, tachypnea, fever and findings concerning for pneumonia on chest radiography.  Was also hypoxic on admission.  Treated with IV vancomycin/Zosyn for 3 days, transitioned to oral Levaquin 01/17/14 with planned treatment course through 01/20/14.  Blood cultures negative to date. Strep pneumonia and Legionella urinary antigens negative. Active Problems:   COPD  Continue albuterol as needed, Symbicort, and Spiriva.   Hyperlipidemia / coronary artery disease  Continue aspirin. Resume statin.   Hypokalemia  Monitor and replace potassium as needed.   Right knee pain   MRI done 01/18/14: Acute grade 1 sprain of the proximal LCL superimposed on chronic degenerative changes of the proximal LCL.Otherwise normal exam.  PT consultation requested. Seen by physical medicine/rehabilitation, not a candidate for CIR.  Hinged knee brace requested per ortho tech.   Acute renal failure  Likely from diuretics. Hold Lasix.   HYPOTHYROIDISM  Continue Synthroid.   GERD (gastroesophageal reflux disease)  Continue PPI therapy.   Normocytic anemia  Hemoglobin stable status post recent diverticular bleed.   Acute on chronic diastolic CHF / Elevated brain natriuretic peptide (BNP) level  2-D echocardiogram done 01/17/14. EF 65-70% with no regional wall motion  abnormalities and mild mitral valve stenosis.  Prior echocardiogram done 06/09/12, EF T5-70% with grade 1 diastolic dysfunction.  Discontinue Lasix given rise in creatinine.   DVT Prophylaxis  We'll start SCDs.   Code Status: Full. Family Communication: Niece at bedside. Disposition Plan: SNF when stable.   IV Access:    Peripheral IV   Procedures:    None.   Medical Consultants:    Dr. Alger Simons, Physical Medicine & Rehabilitation   Other Consultants:    Physical therapy   Anti-Infectives:    Vancomycin 5/2 --> 5/4   Zosyn 5/2 --> 5/4   Levaquin 5/4 --> 5/7  Subjective:    [name] is having some right-sided knee pain, and some nasal irritation from the drying effects of her oxygen. She tells me she uses oxygen at night chronically. Appetite remains poor. Her bowels have not moved since admission, but does not feel she needs a stronger laxative at this point. Has some nausea but no abdominal discomfort.  Objective:    Filed Vitals:   01/18/14 1938 01/19/14 0500 01/19/14 0550 01/19/14 0915  BP:   104/54   Pulse:   99   Temp:   98.8 F (37.1 C)   TempSrc:   Oral   Resp:   18   Height:      Weight:  73.4 kg (161 lb 13.1 oz)    SpO2: 93%  93% 95%    Intake/Output Summary (Last 24 hours) at 01/19/14 1104 Last data filed at 01/19/14 0933  Gross per 24 hour  Intake    600 ml  Output   1400 ml  Net   -800 ml    Exam: Gen:  NAD Cardiovascular:  RRR, grade 3/6 systolic  ejection murmur Respiratory:  Lungs CTAB Gastrointestinal:  Abdomen softly distended, + BS Extremities:  No C/E/C   Data Reviewed:    Labs: Basic Metabolic Panel:  Recent Labs Lab 01/14/14 2320 01/15/14 0455 01/16/14 0403 01/17/14 0315 01/18/14 0815 01/19/14 0312  NA 143 145 141 142 140 139  K 3.8 3.4* 3.6* 3.5* 3.4* 4.0  CL 101 104 101 98 94* 94*  CO2 33* 34* 30 34* 34* 35*  GLUCOSE 143* 123* 145* 121* 128* 121*  BUN $Re'14 11 10 8 14 17  'ztf$ CREATININE  0.56 0.60 0.69 0.62 1.02 1.37*  CALCIUM 8.5 8.3* 8.4 8.6 8.9 9.2  MG 2.2  --   --   --   --   --   PHOS 2.5  --   --   --   --   --    GFR Estimated Creatinine Clearance: 34.1 ml/min (by C-G formula based on Cr of 1.37). Liver Function Tests:  Recent Labs Lab 01/14/14 2320  AST 15  ALT 10  ALKPHOS 79  BILITOT 0.3  PROT 6.5  ALBUMIN 3.0*   CBC:  Recent Labs Lab 01/15/14 0455 01/16/14 0403 01/17/14 0315 01/18/14 0815 01/19/14 0312  WBC 8.7 11.4* 9.1 10.0 10.2  HGB 8.1* 8.9* 9.2* 9.8* 10.2*  HCT 26.1* 28.3* 29.8* 31.5* 32.7*  MCV 94.9 93.4 93.4 91.8 92.4  PLT 274 317 341 408* 401*   Cardiac Enzymes:  Recent Labs Lab 01/14/14 2320 01/15/14 0455 01/15/14 1005  TROPONINI <0.30 <0.30 <0.30   BNP (last 3 results)  Recent Labs  01/14/14 2320 01/17/14 0315  PROBNP 1418.0* 1120.0*   Sepsis Labs:  Recent Labs Lab 01/14/14 2320 01/14/14 2330  01/16/14 0403 01/17/14 0315 01/18/14 0815 01/19/14 0312  PROCALCITON  --  <0.10  --  <0.10  --  <0.10  --   WBC 10.6*  --   < > 11.4* 9.1 10.0 10.2  LATICACIDVEN  --  1.3  --   --   --   --   --   < > = values in this interval not displayed. Microbiology Recent Results (from the past 240 hour(s))  CULTURE, BLOOD (ROUTINE X 2)     Status: None   Collection Time    01/14/14 11:20 PM      Result Value Ref Range Status   Specimen Description BLOOD LEFT ANTECUBITAL   Final   Special Requests BOTTLES DRAWN AEROBIC ONLY 10CC   Final   Culture  Setup Time     Final   Value: 01/15/2014 03:30     Performed at [facility]   Culture     Final   Value:        BLOOD CULTURE RECEIVED NO GROWTH TO DATE CULTURE WILL BE HELD FOR 5 DAYS BEFORE ISSUING A FINAL NEGATIVE REPORT     Performed at [facility]   Report Status PENDING   Incomplete  CULTURE, BLOOD (ROUTINE X 2)     Status: None   Collection Time    01/14/14 11:30 PM      Result Value Ref Range Status   Specimen Description BLOOD LEFT HAND   Final    Special Requests BOTTLES DRAWN AEROBIC ONLY 4CC   Final   Culture  Setup Time     Final   Value: 01/15/2014 08:01     Performed at [facility]   Culture     Final   Value:        BLOOD  CULTURE RECEIVED NO GROWTH TO DATE CULTURE WILL BE HELD FOR 5 DAYS BEFORE ISSUING A FINAL NEGATIVE REPORT     Performed at [facility]   Report Status PENDING   Incomplete  URINE CULTURE     Status: None   Collection Time    01/14/14 11:53 PM      Result Value Ref Range Status   Specimen Description URINE, CLEAN CATCH   Final   Special Requests NONE   Final   Culture  Setup Time     Final   Value: 01/15/2014 03:28     Performed at [facility]     Final   Value: NO GROWTH     Performed at [facility]   Culture     Final   Value: NO GROWTH     Performed at [facility]   Report Status 01/17/2014 FINAL   Final     Radiographs/Studies:   Mr Knee Right Wo Contrast  01/18/2014   CLINICAL DATA:  Acute onset of right lateral knee pain.  EXAM: MRI OF THE RIGHT KNEE WITHOUT CONTRAST  TECHNIQUE: Multiplanar, multisequence MR imaging of the knee was performed. No intravenous contrast was administered.  COMPARISON:  None.  FINDINGS: MENISCI  Medial meniscus:  Normal.  Lateral meniscus:  Normal.  LIGAMENTS  Cruciates:  Normal.  Collaterals: There is an acute grade 1 sprain of the proximal LCL. This is superimposed on chronic changes in the proximal LCL indicating prior injury.  The MCL is normal.  CARTILAGE  Patellofemoral:  Normal.  Medial:  Normal.  Lateral:  Normal.  Joint:  Normal.  Popliteal Fossa:  Tiny Baker's cyst.  Extensor Mechanism:  Normal.  IMPRESSION: Acute grade 1 sprain of the proximal LCL superimposed on chronic degenerative changes of the proximal LCL.Otherwise normal exam.   Electronically Signed   By: Rozetta Nunnery M.D.   On: 01/18/2014 18:51   Dg Chest Port 1 View  01/16/2014   CLINICAL DATA:  Cough, shortness of breath  EXAM: PORTABLE  CHEST - 1 VIEW  COMPARISON:  DG CHEST 1V PORT dated 01/14/2014; DG CHEST 1V PORT dated 01/10/2014; DG CHEST 1V PORT dated 04/23/2012  FINDINGS: Grossly unchanged enlarged cardiac silhouette and mediastinal contours with atherosclerotic plaque within the thoracic aorta. Lungs remain hyperexpanded with flattening of the bilateral hemidiaphragms and thinning of the biapical pulmonary parenchyma, right greater than left. Pulmonary vasculature is indistinct with cephalization of flow. Worsening of bibasilar heterogeneous opacities, left greater than right. Suspected increased in small / trace bilateral effusions. No pneumothorax. Unchanged bones.  IMPRESSION: 1. Findings suggestive of worsening of pulmonary edema superimposed on advanced emphysematous change. 2. Worsening small/trace bilateral effusions and associated bibasilar opacities, left greater than right, atelectasis versus infiltrate.   Electronically Signed   By: Sandi Mariscal M.D.   On: 01/16/2014 14:15   Portable Chest 1 View  01/15/2014   CLINICAL DATA:  Fever, COPD  EXAM: PORTABLE CHEST - 1 VIEW  COMPARISON:  02/09/2014  FINDINGS: Chronic interstitial markings/emphysematous changes with bullous changes in the right upper lobe.  Mild left basilar opacity, likely atelectasis.  No pleural effusion or pneumothorax.  Cardiomegaly.  IMPRESSION: Chronic interstitial markings/emphysematous changes.  Mild left basilar opacity, likely atelectasis.   Electronically Signed   By: Julian Hy M.D.   On: 01/15/2014 00:06    Medications:   . antiseptic oral rinse  15 mL Mouth Rinse BID  . aspirin EC  81  mg Oral Daily  . budesonide-formoterol  2 puff Inhalation BID  . fesoterodine  4 mg Oral Daily  . [START ON 01/20/2014] levofloxacin  500 mg Oral Q48H  . levothyroxine  150 mcg Oral QAC breakfast  . pantoprazole  40 mg Oral Daily  . polyethylene glycol  17 g Oral Daily  . senna-docusate  1 tablet Oral BID  . tiotropium  18 mcg Inhalation Daily   Continuous  Infusions:   Time spent: 25 minutes.   LOS: 5 days   Kersey  [facility] Pager [phone]. If unable to reach me by pager, please call my cell phone at [phone].  *Please refer to [email or web address], password [email or web address] to get updated schedule on who will round on this patient, as hospitalists switch teams weekly. If 7PM-7AM, please contact night-coverage at [email or web address], password [email or web address] for any overnight needs.  01/19/2014, 11:04 AM    **Disclaimer: This note was dictated with voice recognition software. Similar sounding words can inadvertently be transcribed and this note may contain transcription errors which may not have been corrected upon publication of note.**

## 2014-01-19 NOTE — Progress Notes (Signed)
CSW continuing to follow for disposition planning.  CSW received notification from Danne Baxter with [facility] notifying that pt not appropriate for [facility].  CSW met with pt and pt niece at bedside. CSW provided SNF bed offers.  Pt chooses bed at [facility].  CSW notified [facility] [facility] of pt acceptance of bed offer.  Per MD, pt not yet medically stable for discharge. Anticipate tomorrow.  CSW to continue to follow and facilitate pt discharge needs to [facility] when pt medically ready for discharge.  Alison Murray, MSW, [place] Work [phone]

## 2014-01-19 NOTE — Evaluation (Signed)
Occupational Therapy Evaluation Patient Details Name: [name] MRN: [id number] DOB: 07-23-1933 Today's Date: 01/19/2014    History of Present Illness 80 yo female recently discharged after hospitalization for GI bleed secondary to diverticulosis and readmitted 5/1 for acute respiratory failure with hypoxia/fever and CAP, recently reporting R lateral knee pain after transferring with staff at [facility], MRI shows grade 1 sprain of LCL; PMHx of arthritis, breast cancer, CVA, oxygen dependent, dyspnea, anemia, PVD, HTN   Clinical Impression   Pt was readmitted with new dx of respiratory failure/cap.  She c/o lateral knee pain while in the hospital and now has neoprene brace on R knee.  She was mod I with adls prior to admission and is now at a min A level.  Goals are set for supervision.  She will benefit from skilled OT at snf after acute hospitalization as she lives with 67 year old sister and needs to be mod I.    Follow Up Recommendations  SNF    Equipment Recommendations  None recommended by OT    Recommendations for Other Services       Precautions / Restrictions Precautions Precautions: Fall Precaution Comments: O2 dependent Required Braces or Orthoses:  (wearing neoprene stabilizing brace) Restrictions Weight Bearing Restrictions: No      Mobility Bed Mobility Overal bed mobility: Needs Assistance       Supine to sit: Min assist;HOB elevated     General bed mobility comments: sitting on EOB upon entering room  Transfers Overall transfer level: Needs assistance Equipment used: Rolling walker (2 wheeled) Transfers: Sit to/from Stand Sit to Stand: Min guard         General transfer comment: verbal cues for hand and R LE positioning to minimize pain during transfer    Balance                                            ADL Overall ADL's : Needs assistance/impaired     Grooming: Oral care;Wash/dry hands;Standing;Supervision/safety   Upper Body  Bathing: Set up;Sitting   Lower Body Bathing: Minimal assistance;Sit to/from stand   Upper Body Dressing : Set up;Sitting   Lower Body Dressing: Minimal assistance;Sit to/from stand   Toilet Transfer: Min guard;Ambulation;RW   Toileting- Clothing Manipulation and Hygiene: Supervision/safety;Sit to/from stand       Functional mobility during ADLs: Min guard;Rolling walker General ADL Comments: pt is wearing knee neoprene brace on R knee--helping with pain.  Min A for adls--difficulty reaching this foot.  Pt ambulated to bathroom and tolerated OT session well     Vision                     Perception     Praxis      Pertinent Vitals/Pain R knee sore; repositioned     Hand Dominance     Extremity/Trunk Assessment Upper Extremity Assessment Upper Extremity Assessment: Overall WFL for tasks assessed           Communication Communication Communication: No difficulties   Cognition Arousal/Alertness: Awake/alert Behavior During Therapy: WFL for tasks assessed/performed Overall Cognitive Status: Within Functional Limits for tasks assessed                     General Comments       Exercises       Shoulder Instructions      Home  Living Family/patient expects to be discharged to:: Private residence Living Arrangements: Other relatives (sister who uses walker)   Type of Home: House Home Access: Stairs to enter [employer] of Steps: 2 Entrance Stairs-Rails: Right;Left;Can reach both Home Layout: One level     Bathroom Shower/Tub: Walk-in shower;Tub/shower unit   Bathroom Toilet: Handicapped height         Additional Comments: walk in shower is in sister's bathroom; this has a built in seat      Prior Functioning/Environment Level of Independence: Independent with assistive device(s)             OT Diagnosis: Generalized weakness   OT Problem List: Pain;Decreased strength;Decreased knowledge of use of DME or AE   OT  Treatment/Interventions: Self-care/ADL training;DME and/or AE instruction;Patient/family education    OT Goals(Current goals can be found in the care plan section) Acute Rehab OT Goals Patient Stated Goal: home OT Goal Formulation: With patient Time For Goal Achievement: 02/02/14 Potential to Achieve Goals: Good ADL Goals Pt Will Transfer to Toilet: with supervision;ambulating (comfort height) Pt Will Perform Toileting - Clothing Manipulation and hygiene: with supervision;sit to/from stand Additional ADL Goal #1: pt will gather clothes at supervision level and complete adls with supervision, sit to stand  OT Frequency: Min 2X/week   Barriers to D/C:            Co-evaluation              End of Session    Activity Tolerance: Patient tolerated treatment well Patient left: in bed;with call bell/phone within reach   Time: 1253-1322 OT Time Calculation (min): 29 min Charges:  OT General Charges $OT Visit: 1 Procedure OT Evaluation $Initial OT Evaluation Tier I: 1 Procedure OT Treatments $Self Care/Home Management : 23-37 mins G-Codes:    Lesle Chris 20-Jan-2014, 1:49 PM  Lesle Chris, OTR/L [phone] 01-20-14

## 2014-01-19 NOTE — Progress Notes (Addendum)
Clinical Social Work Department CLINICAL SOCIAL WORK PLACEMENT NOTE 01/19/2014  Patient:  [name], [name]  Account Number:  [id number] Admit date:  01/14/2014  Clinical Social Worker:  Ulyess Blossom  Date/time:  01/18/2014 04:30 PM  Clinical Social Work is seeking post-discharge placement for this patient at the following level of care:   SKILLED NURSING   (*CSW will update this form in Epic as items are completed)   01/18/2014  Patient/family provided with [place] Department of Clinical Social Work's list of facilities offering this level of care within the geographic area requested by the patient (or if unable, by the patient's family).  01/18/2014  Patient/family informed of their freedom to choose among providers that offer the needed level of care, that participate in Medicare, Medicaid or managed care program needed by the patient, have an available bed and are willing to accept the patient.  01/18/2014  Patient/family informed of [facility]' ownership interest in [facility], as well as of the fact that they are under no obligation to receive care at this facility.  PASARR submitted to EDS on 01/18/2014 PASARR number received from [employer] on 01/18/2014  FL2 transmitted to all facilities in geographic area requested by pt/family on  01/18/2014 FL2 transmitted to all facilities within larger geographic area on   Patient informed that his/her managed care company has contracts with or will negotiate with  certain facilities, including the following:     Patient/family informed of bed offers received:  01/19/2014 Patient chooses bed at [facility] Physician recommends and patient chooses bed at    Patient to be transferred to [facility] on  01/20/2014 Patient to be transferred to facility by ambulance Corey Harold)  The following physician request were entered in Epic:   Additional Comments:    Alison Murray, MSW, [place] Work [phone]

## 2014-01-19 NOTE — Progress Notes (Signed)
Physical Therapy Treatment Patient Details Name: [name] MRN: [id number] DOB: 1933-02-06 Today's Date: 01/19/2014    History of Present Illness 80 yo female recently discharged after hospitalization for GI bleed secondary to diverticulosis and readmitted 5/1 for acute respiratory failure with hypoxia/fever and CAP, recently reporting R lateral knee pain after transferring with staff at [facility], MRI shows grade 1 sprain of LCL; PMHx of arthritis, breast cancer, CVA, oxygen dependent, dyspnea, anemia, PVD, HTN    PT Comments    Pt ambulated in hallway with RW and 3L O2.  Provided cues for transfers and ambulation to minimize pt knee pain.  Discussed ambulation in hallway with niece, advised to walk with staff to make sure medication does not make her dizzy.  Provided education to pt and niece on using ice, sit-to-stand transfers, and exercises (knee presses and ankle pumps).  Updated follow-up recommendations to SNF due to increased difficulty walking and decreased caregiver support at home.  Follow Up Recommendations  SNF     Equipment Recommendations  None recommended by PT    Recommendations for Other Services       Precautions / Restrictions Precautions Precautions: Fall Precaution Comments: O2 dependent Restrictions Weight Bearing Restrictions: No    Mobility  Bed Mobility Overal bed mobility: Needs Assistance       Supine to sit: Min assist;HOB elevated     General bed mobility comments: sitting on EOB upon entering room  Transfers Overall transfer level: Needs assistance Equipment used: Rolling walker (2 wheeled) Transfers: Sit to/from Stand Sit to Stand: Min guard         General transfer comment: verbal cues for hand and R LE positioning to minimize pain during transfer  Ambulation/Gait Ambulation/Gait assistance: Min guard Ambulation Distance (Feet): 120 Feet Assistive device: Rolling walker (2 wheeled) Gait Pattern/deviations: Step-to pattern;Decreased  step length - right;Antalgic Gait velocity: 0.33 ft/sec improved to 0.457 ft/sec Gait velocity interpretation: <1.8 ft/sec, indicative of risk for recurrent falls General Gait Details: pt required multiple multi-modal cues to explain and reinforce sequencing with RW; pt had decreased pain with ambulation with appropriate use of RW, pt steady with ambulation, gait speed improved slightly; ambulating on 3L O2   Stairs            Wheelchair Mobility    Modified Rankin (Stroke Patients Only)       Balance                                    Cognition Arousal/Alertness: Awake/alert Behavior During Therapy: WFL for tasks assessed/performed Overall Cognitive Status: Within Functional Limits for tasks assessed                      Exercises      General Comments        Pertinent Vitals/Pain Pain 6/10.  Activity to tolerance.  Applied ice at end of treatment.    Home Living Family/patient expects to be discharged to:: Private residence Living Arrangements: Other relatives             Additional Comments: pt will have sister, son and hired help assist her    Prior Function Level of Independence: Independent with assistive device(s)          PT Goals (current goals can now be found in the care plan section) Progress towards PT goals: Progressing toward goals    Frequency  Min 3X/week  PT Plan Discharge plan needs to be updated    Co-evaluation             End of Session Equipment Utilized During Treatment: Oxygen Activity Tolerance: Patient tolerated treatment well Patient left: with call bell/phone within reach;with family/visitor present;in chair     Time: 7510-2585 PT Time Calculation (min): 26 min  Charges:  $Gait Training: 23-37 mins                    G CodesJacqulyn Cane 02-01-2014, 12:40 PM Jacqulyn Cane SPT 02/01/2014

## 2014-01-19 NOTE — Progress Notes (Signed)
I have reviewed this note and agree with all findings. Kati Delonte Musich, PT, DPT Pager: [phone]   

## 2014-01-19 NOTE — Consult Note (Signed)
Physical Medicine and Rehabilitation Consult Reason for Consult: Deconditioning/acute respiratory failure/recent GI bleed Referring Physician: [facility]   HPI: [name] is a 80 y.o. right-handed female with history of COPD and oxygen dependent, chronic systolic congestive heart failure, CAD and recent hospitalization for GI bleed secondary to diverticulosis with discharge 01/12/2014. Presented 01/14/2014 with increasing weakness, productive cough and fever of 101. Chest x-ray with chronic interstitial markings mild left basilar opacity suspect pneumonia. Placed on broad-spectrum antibiotics. Strep pneumonia negative. Blood cultures remain negative. Antibiotics transitioned to oral Levaquin for suspect community-acquired pneumonia. Hemoglobin and hematocrit remained stable after recent GI bleed latest hemoglobin 10.2. Patient with ongoing complaints of right knee after she twisted it getting out of bed and noted progressive pain limiting mobility. MRI of right knee 01/18/2014 shows acute grade 1 sprain of the proximal LCL  superimposed on chronic degenerative changes. Physical therapy occupational therapy evaluations completed an ongoing. M.D.has requested physical medicine rehabilitation consult.   Review of Systems  Constitutional: Positive for fever.  Respiratory: Positive for cough, sputum production and shortness of breath.   Cardiovascular: Positive for leg swelling.  Gastrointestinal:       GERD  All other systems reviewed and are negative.  Past Medical History  Diagnosis Date  . COPD (chronic obstructive pulmonary disease)   . Hypertrophic obstructive cardiomyopathy     Echo 10/10 EF 65-70% SW 1.6 PW 1.4 mild SAM no LVOT gradient at rest. Valsalva not performed Mild MR. No hyperenhance ment on MRI EF 69%.  . Coronary atherosclerosis of native coronary artery     Cath 1/11: RCA 50-60 ostial o/w normal. EF 70%.   . Pulmonary hypertension     Cath 1/11 RA 8, RV 43/6/14, PA  42/17 (29) PCWP 15 Fick  5.0/2.6. PVR 2.8    . Edema   . Occlusion and stenosis of carotid artery     s/p L CEA u/s 3/11. R 60-79%; L 40-59%. no change since 3/10  . Peripheral vascular disease, unspecified   . Hypertension   . Emphysema   . Asthma   . Hypothyroidism   . Allergic rhinitis   . Gastric AVM   . Heart murmur   . Anginal pain     "pressure"  . Emphysema   . Oxygen dependent 04/23/2012    "concentrated at night; liquid during day"  . Dyspnea 04/23/2012    "all the time"  . Anemia 04/23/2012    "severe @ times; think caused by colonic AVMs"  . History of blood transfusion     "many times"  . H/O hiatal hernia   . Stroke ~ 2004    denies residual on 04/23/2012  . Arthritis     "probably"  . Breast cancer   . Pneumonia    Past Surgical History  Procedure Laterality Date  . Carotid endarterectomy  2005    left  . Thyroidectomy  2007  . Appendectomy  1950  . Breast lumpectomy  2002    right-hx of radiation  . Dilation and curettage of uterus  1950's  . Cataract extraction w/ intraocular lens implant  02/2011    right  . Excisional hemorrhoidectomy  1950's  . Cardiac catheterization    . Colonoscopy N/A 01/12/2014    Procedure: COLONOSCOPY;  Surgeon: Gatha Mayer, MD;  Location: [facility];  Service: Endoscopy;  Laterality: N/A;  MAC if available   Family History  Problem Relation Age of Onset  . Emphysema Father   .  COPD Sister   . Hypertension Sister     Pulmonary  . Emphysema Sister   . Asthma Sister   . Asthma Sister   . Emphysema Brother     x2  . Colon cancer Brother   . Colon cancer Mother   . Stroke Mother    Social History:  reports that she quit smoking about 16 years ago. Her smoking use included Cigarettes. She has a 30 pack-year smoking history. She has never used smokeless tobacco. She reports that she does not drink alcohol or use illicit drugs. Allergies:  Allergies  Allergen Reactions  . Latex Hives and Itching   Medications Prior to  Admission  Medication Sig Dispense Refill  . albuterol (PROVENTIL HFA;VENTOLIN HFA) 108 (90 BASE) MCG/ACT inhaler Inhale 2 puffs into the lungs every 6 (six) hours as needed for wheezing or shortness of breath.      Marland Kitchen aspirin 81 MG EC tablet Take 81 mg by mouth Victoria.        Marland Kitchen atorvastatin (LIPITOR) 20 MG tablet Take 20 mg by mouth Victoria.        . budesonide-formoterol (SYMBICORT) 160-4.5 MCG/ACT inhaler Inhale 2 puffs into the lungs 2 (two) times Victoria.      . Calcium Carbonate (CALCIUM 600) 1500 MG TABS Take 1 tablet by mouth Victoria.        . Cholecalciferol (VITAMIN D-3 PO) Take 1,000 Units by mouth Victoria.       . furosemide (LASIX) 20 MG tablet Take 20 mg by mouth Victoria.        Marland Kitchen HYDROcodone-acetaminophen (NORCO/VICODIN) 5-325 MG per tablet Take 1 tablet by mouth every 6 (six) hours as needed for moderate pain.      Marland Kitchen levothyroxine (SYNTHROID, LEVOTHROID) 50 MCG tablet Take 150 mcg by mouth Victoria.       . Multiple Vitamins-Minerals (ONE-A-DAY EXTRAS ANTIOXIDANT) CAPS Take 1 capsule by mouth Victoria.        Marland Kitchen omeprazole (PRILOSEC) 20 MG capsule Take 20 mg by mouth Victoria.      . Pseudoephedrine HCl (SUDAFED 24 HOUR PO) Take 1 tablet by mouth Victoria.      Marland Kitchen tiotropium (SPIRIVA) 18 MCG inhalation capsule Place 18 mcg into inhaler and inhale Victoria.      Marland Kitchen tolterodine (DETROL LA) 2 MG 24 hr capsule Take 2 mg by mouth Victoria.        Home: Home Living Family/patient expects to be discharged to:: Private residence Living Arrangements: Other relatives (sister) Available Help at Discharge: Family (brother next door) Type of Home: House Home Access: Stairs to enter Technical brewer of Steps: 2 Entrance Stairs-Rails: Right;Left;Can reach both Home Layout: One [place]: [place] - 4 wheels;Cane - single point  Functional History: Prior Function Comments: uses 3L O2 at night, uses O2 as needed during the day Functional Status:  Mobility: Bed Mobility Overal bed mobility: Needs  Assistance Bed Mobility: Supine to Sit Supine to sit: Min assist;HOB elevated General bed mobility comments: pt used therapist hand to pull trunk upright, verbal cues for repositioning Transfers Overall transfer level: Needs assistance Equipment used: 1 person hand held assist;Rolling walker (2 wheeled) Transfers: Sit to/from Stand Sit to Stand: Min assist General transfer comment: verbal cues for sequence and safety; SpO2 on RA upon standing 84%, applied 3L O2, SpO2 recovered >90% Ambulation/Gait Ambulation/Gait assistance: Min guard Ambulation Distance (Feet): 120 Feet Assistive device: Rolling walker (2 wheeled) Gait Pattern/deviations: Step-through pattern Gait velocity: decreased General Gait Details: complained of  R knee pain during flexion of ambulation; pt had difficulty steering RW but reported she was feeling steady on her feet and the RW was pulling to the right, ambulated on 3L O2, SpO2 95%    ADL:    Cognition: Cognition Overall Cognitive Status: Within Functional Limits for tasks assessed Orientation Level: Oriented X4 Cognition Arousal/Alertness: Awake/alert Behavior During Therapy: WFL for tasks assessed/performed Overall Cognitive Status: Within Functional Limits for tasks assessed  Blood pressure 111/59, pulse 106, temperature 98.4 F (36.9 C), temperature source Oral, resp. rate 18, height 5\' 6"  (1.676 m), weight 73.256 kg (161 lb 8 oz), SpO2 93.00%. Physical Exam  Vitals reviewed. Constitutional: She is oriented to person, place, and time.  HENT:  Head: Normocephalic.  Eyes: EOM are normal.  Neck: Normal range of motion. Neck supple. No thyromegaly present.  Cardiovascular: Normal rate and regular rhythm.   Respiratory:  Decreased breath sounds but clear to auscultation  GI: Soft. Bowel sounds are normal. She exhibits no distension.  Musculoskeletal:  Right knee with increased pain laterally. Tolerated minimal palpation and PROM of joint. Effusion  noted laterally. Cannot lift leg off the bed.  Neurological: She is alert and oriented to person, place, and time.  Follows full commands  Skin: Skin is warm and dry.  Psychiatric: She has a normal mood and affect.    Results for orders placed during the hospital encounter of 01/14/14 (from the past 24 hour(s))  PROCALCITONIN     Status: None   Collection Time    01/18/14  8:15 AM      Result Value Ref Range   Procalcitonin <0.10    CBC     Status: Abnormal   Collection Time    01/18/14  8:15 AM      Result Value Ref Range   WBC 10.0  4.0 - 10.5 K/uL   RBC 3.43 (*) 3.87 - 5.11 MIL/uL   Hemoglobin 9.8 (*) 12.0 - 15.0 g/dL   HCT 31.5 (*) 36.0 - 46.0 %   MCV 91.8  78.0 - 100.0 fL   MCH 28.6  26.0 - 34.0 pg   MCHC 31.1  30.0 - 36.0 g/dL   RDW 14.1  11.5 - 15.5 %   Platelets 408 (*) 150 - 400 K/uL  BASIC METABOLIC PANEL     Status: Abnormal   Collection Time    01/18/14  8:15 AM      Result Value Ref Range   Sodium 140  137 - 147 mEq/L   Potassium 3.4 (*) 3.7 - 5.3 mEq/L   Chloride 94 (*) 96 - 112 mEq/L   CO2 34 (*) 19 - 32 mEq/L   Glucose, Bld 128 (*) 70 - 99 mg/dL   BUN 14  6 - 23 mg/dL   Creatinine, Ser 1.02  0.50 - 1.10 mg/dL   Calcium 8.9  8.4 - 10.5 mg/dL   GFR calc non Af Amer 51 (*) >90 mL/min   GFR calc Af Amer 59 (*) >90 mL/min  CBC     Status: Abnormal   Collection Time    01/19/14  3:12 AM      Result Value Ref Range   WBC 10.2  4.0 - 10.5 K/uL   RBC 3.54 (*) 3.87 - 5.11 MIL/uL   Hemoglobin 10.2 (*) 12.0 - 15.0 g/dL   HCT 32.7 (*) 36.0 - 46.0 %   MCV 92.4  78.0 - 100.0 fL   MCH 28.8  26.0 - 34.0 pg   MCHC  31.2  30.0 - 36.0 g/dL   RDW 14.1  11.5 - 15.5 %   Platelets 401 (*) 150 - 400 K/uL  BASIC METABOLIC PANEL     Status: Abnormal   Collection Time    01/19/14  3:12 AM      Result Value Ref Range   Sodium 139  137 - 147 mEq/L   Potassium 4.0  3.7 - 5.3 mEq/L   Chloride 94 (*) 96 - 112 mEq/L   CO2 35 (*) 19 - 32 mEq/L   Glucose, Bld 121 (*) 70 - 99  mg/dL   BUN 17  6 - 23 mg/dL   Creatinine, Ser 1.37 (*) 0.50 - 1.10 mg/dL   Calcium 9.2  8.4 - 10.5 mg/dL   GFR calc non Af Amer 36 (*) >90 mL/min   GFR calc Af Amer 41 (*) >90 mL/min   Mr Knee Right Wo Contrast  01/18/2014   CLINICAL DATA:  Acute onset of right lateral knee pain.  EXAM: MRI OF THE RIGHT KNEE WITHOUT CONTRAST  TECHNIQUE: Multiplanar, multisequence MR imaging of the knee was performed. No intravenous contrast was administered.  COMPARISON:  None.  FINDINGS: MENISCI  Medial meniscus:  Normal.  Lateral meniscus:  Normal.  LIGAMENTS  Cruciates:  Normal.  Collaterals: There is an acute grade 1 sprain of the proximal LCL. This is superimposed on chronic changes in the proximal LCL indicating prior injury.  The MCL is normal.  CARTILAGE  Patellofemoral:  Normal.  Medial:  Normal.  Lateral:  Normal.  Joint:  Normal.  Popliteal Fossa:  Tiny Baker's cyst.  Extensor Mechanism:  Normal.  IMPRESSION: Acute grade 1 sprain of the proximal LCL superimposed on chronic degenerative changes of the proximal LCL.Otherwise normal exam.   Electronically Signed   By: Rozetta Nunnery M.D.   On: 01/18/2014 18:51    Assessment/Plan: Diagnosis: weakness after pneumonia, right LCL sprain 1. Does the need for close, 24 hr/day medical supervision in concert with the patient's rehab needs make it unreasonable for this patient to be served in a less intensive setting? No 2. Co-Morbidities requiring supervision/potential complications: hx of renal failure, pain 3. Due to bladder management, bowel management, safety, skin/wound care, medication administration and pain management, does the patient require 24 hr/day rehab nursing? No and Potentially 4. Does the patient require coordinated care of a physician, rehab nurse, PT, OT to address physical and functional deficits in the context of the above medical diagnosis(es)? No Addressing deficits in the following areas: balance, endurance, locomotion, strength, transferring,  dressing and toileting 5. Can the patient actively participate in an intensive therapy program of at least 3 hrs of therapy per day at least 5 days per week? Potentially 6. The potential for patient to make measurable gains while on inpatient rehab is fair 7. Anticipated functional outcomes upon discharge from inpatient rehab are n/a  with PT, n/a with OT, n/a with SLP. 8. Estimated rehab length of stay to reach the above functional goals is: n/a 9. Does the patient have adequate social supports to accommodate these discharge functional goals? No 10. Anticipated D/C setting: Other 11. Anticipated post D/C treatments: HH therapy, Outpatient therapy and Home excercise program 12. Overall Rehab/Functional Prognosis: excellent  RECOMMENDATIONS: This patient's condition is appropriate for continued rehabilitative care in the following setting: SNF Patient has agreed to participate in recommended program. Yes Note that insurance prior authorization may be required for reimbursement for recommended care.  Comment: Pt lacks medical necessity to require  CIR. Recommend short term SNF. In the meantime, she needs a hinged knee brace to offer support and stability for the right knee. I would be happy to contact the orthotist and order the brace if needed-----just give me a call [phone]).  Thanks Meredith Staggers, MD, [facility]     01/19/2014

## 2014-01-19 NOTE — Progress Notes (Signed)
Pt most appropriate for SNF level rehab. No medical need for continued in hospital rehab at inpt rehab. I have notified SW. Please call me with any questions. [phone]

## 2014-01-19 NOTE — Evaluation (Deleted)
Occupational Therapy Evaluation Patient Details Name: Roseanna Koplin MRN: [id number] DOB: October 02, 1932 Today's Date: 01/19/2014    History of Present Illness 80 yo female recently discharged after hospitalization for GI bleed secondary to diverticulosis and readmitted 5/1 for acute respiratory failure with hypoxia/fever and CAP with PMHx of arthritis, breast cancer, CVA, oxygen dependent, dyspnea, anemia, PVD, HTN.   Clinical Impression   Pt was admitted for the above surgery.  She has had several surgeries and is able to transfer to commode at supervision level.  All education was completed and no further OT is needed at this time.      Follow Up Recommendations  No OT follow up;Supervision/Assistance - 24 hour    Equipment Recommendations  None recommended by OT    Recommendations for Other Services       Precautions / Restrictions Precautions Precautions: Fall Restrictions Weight Bearing Restrictions: No      Mobility Bed Mobility         Supine to sit: Min assist;HOB elevated     General bed mobility comments: assist for trunk  Transfers       Sit to Stand: Supervision         General transfer comment: vcs for hand position    Balance                                            ADL Overall ADL's : Needs assistance/impaired                         Toilet Transfer: Supervision/safety;Comfort height toilet;Ambulation   Toileting- Clothing Manipulation and Hygiene: Modified independent;Sitting/lateral lean       Functional mobility during ADLs: Supervision/safety;Rolling walker General ADL Comments: pt will have assist for adls as needed.  She is able to lift LLE.  She did not want to practice shower stall.  Her ledge is 2" and she has been able to step in forward; described going in backwards, if she has difficulty forward     Vision                     Perception     Praxis      Pertinent Vitals/Pain L knee is  quite sore; repositioned with ice     Hand Dominance     Extremity/Trunk Assessment             Communication Communication Communication: No difficulties   Cognition Arousal/Alertness: Awake/alert Behavior During Therapy: WFL for tasks assessed/performed Overall Cognitive Status: Within Functional Limits for tasks assessed                     General Comments       Exercises       Shoulder Instructions      Home Living Family/patient expects to be discharged to:: Private residence Living Arrangements: Other relatives                 Bathroom Shower/Tub: Occupational psychologist: Handicapped height         Additional Comments: pt will have sister, son and hired help assist her      Prior Functioning/Environment Level of Independence: Independent with assistive device(s)             OT Diagnosis:     OT Problem List:  OT Treatment/Interventions:      OT Goals(Current goals can be found in the care plan section)    OT Frequency:     Barriers to D/C:            Co-evaluation              End of Session    Activity Tolerance: Patient tolerated treatment well Patient left: in bed;with call bell/phone within reach   Time: 1122-1135 OT Time Calculation (min): 13 min Charges:  OT General Charges $OT Visit: 1 Procedure OT Evaluation $Initial OT Evaluation Tier I: 1 Procedure G-Codes:    Lesle Chris Feb 02, 2014, 11:40 AM  Lesle Chris, OTR/L [phone] 2014/02/02

## 2014-01-20 DIAGNOSIS — I509 Heart failure, unspecified: Secondary | ICD-10-CM

## 2014-01-20 DIAGNOSIS — I5033 Acute on chronic diastolic (congestive) heart failure: Secondary | ICD-10-CM

## 2014-01-20 LAB — BASIC METABOLIC PANEL
BUN: 22 mg/dL (ref 6–23)
CHLORIDE: 95 meq/L — AB (ref 96–112)
CO2: 35 meq/L — AB (ref 19–32)
Calcium: 9.3 mg/dL (ref 8.4–10.5)
Creatinine, Ser: 1.32 mg/dL — ABNORMAL HIGH (ref 0.50–1.10)
GFR calc Af Amer: 43 mL/min — ABNORMAL LOW (ref >90)
GFR calc non Af Amer: 37 mL/min — ABNORMAL LOW (ref >90)
Glucose, Bld: 128 mg/dL — ABNORMAL HIGH (ref 70–99)
Potassium: 3.6 mEq/L — ABNORMAL LOW (ref 3.7–5.3)
SODIUM: 139 meq/L (ref 137–147)

## 2014-01-20 MED ORDER — SENNOSIDES-DOCUSATE SODIUM 8.6-50 MG PO TABS: 1.0000 | ORAL_TABLET | Freq: Two times a day (BID) | ORAL | Status: DC

## 2014-01-20 MED ORDER — ZOLPIDEM TARTRATE 5 MG PO TABS: 5.0000 mg | ORAL_TABLET | Freq: Every evening | ORAL | Status: DC | PRN

## 2014-01-20 MED ORDER — SALINE SPRAY 0.65 % NA SOLN: 1.0000 | NASAL | Status: DC | PRN

## 2014-01-20 MED ORDER — ONDANSETRON HCL 4 MG PO TABS: 4.0000 mg | ORAL_TABLET | Freq: Four times a day (QID) | ORAL | Status: DC | PRN

## 2014-01-20 MED ORDER — POLYETHYLENE GLYCOL 3350 17 G PO PACK: 17.0000 g | PACK | Freq: Every day | ORAL | Status: DC

## 2014-01-20 MED ORDER — POTASSIUM CHLORIDE CRYS ER 20 MEQ PO TBCR
40.0000 meq | EXTENDED_RELEASE_TABLET | Freq: Once | ORAL | Status: AC
  Administered 2014-01-20: 40 meq via ORAL
  Filled 2014-01-20: qty 2

## 2014-01-20 MED ORDER — HYDROCODONE-ACETAMINOPHEN 5-325 MG PO TABS: 1.0000 | ORAL_TABLET | Freq: Four times a day (QID) | ORAL | Status: DC | PRN

## 2014-01-20 NOTE — Progress Notes (Signed)
Report was called to Sharyn Lull, Therapist, sports at [facility]. Patient to be transported via [facility]. Patient stable from AM assessment.

## 2014-01-20 NOTE — Discharge Instructions (Signed)

## 2014-01-20 NOTE — Discharge Summary (Signed)
Physician Discharge Summary  [name] CBU:[id number] DOB: [birth year] DOA: 01/14/2014  PCP: [name], MD  Admit date: 01/14/2014 Discharge date: 01/20/2014   Recommendations for Outpatient Follow-Up:   1. The patient is being discharged to [facility] for rehab. 2. Hold Lasix until creatinine back to usual baseline values, re-check in 48 hours. 3. F/U with PCP in 2 weeks.   Discharge Diagnosis:   Principal Problem:    Sepsis secondary to healthcare associated pneumonia Active Problems:    CAROTID ARTERY DISEASE    COPD    HYPOTHYROIDISM    GERD (gastroesophageal reflux disease)    Fever    Acute renal failure    Normocytic anemia    Elevated brain natriuretic peptide (BNP) level    HCAP (healthcare-associated pneumonia)    Diastolic CHF, acute on chronic   Discharge Condition: Improved.  Diet recommendation: Low sodium, heart healthy.     History of Present Illness:   [name] is an 80 y.o. female with PMH of diverticular bleed status post hospitalization 01/10/14-01/12/14, readmitted 01/14/14 chief complaint of the sudden onset of weakness and fever, chest congestion, cough with clear sputum. Initial evaluation in the ED showed possible pneumonia based on chest radiography and urinalysis with possible signs of infection.  Hospital Course by Problem:   Principal Problem:  Sepsis with fever secondary to healthcare associated pneumonia  Patient met the criteria for sepsis with tachycardia, tachypnea, fever and findings concerning for pneumonia on chest radiography.  Was also hypoxic on admission.  Treated with IV vancomycin/Zosyn for 3 days, transitioned to oral Levaquin 01/17/14 with planned treatment course through 01/20/14.  Blood cultures negative to date. Strep pneumonia and Legionella urinary antigens negative. Active Problems:  COPD  Continue albuterol as needed, Symbicort, and Spiriva. Hyperlipidemia / coronary artery disease  Continue  aspirin and statin. Hypokalemia  Monitor and replace potassium as needed. Right knee pain  MRI done 01/18/14: Acute grade 1 sprain of the proximal LCL superimposed on chronic degenerative changes of the proximal LCL.Otherwise normal exam.  PT consultation requested. Seen by physical medicine/rehabilitation, not a candidate for CIR.  Hinged knee brace provided. Acute renal failure  Likely from diuretics. Hold Lasix for 2 more days, recheck creatinine on 01/22/14 and resume Lasix at normal dose if creatinine back to usual baseline values. HYPOTHYROIDISM  Continue Synthroid. GERD (gastroesophageal reflux disease)  Continue PPI therapy. Normocytic anemia  Hemoglobin stable status post recent diverticular bleed. Acute on chronic diastolic CHF / Elevated brain natriuretic peptide (BNP) level  2-D echocardiogram done 01/17/14. EF 65-70% with no regional wall motion abnormalities and mild mitral valve stenosis.  Prior echocardiogram done 06/09/12, EF T5-70% with grade 1 diastolic dysfunction.  Lasix on hold secondary to acute kidney injury from recent diuresis.   Procedures:    None.   Medical Consultants:    Dr. Alger Simons, Physical Medicine & Rehabilitation    Discharge Exam:   Filed Vitals:   01/20/14 0455  BP: 113/54  Pulse: 104  Temp: 98.9 F (37.2 C)  Resp:    Filed Vitals:   01/19/14 2113 01/20/14 0455 01/20/14 0656 01/20/14 0840  BP: 124/46 113/54    Pulse: 95 104    Temp: 98.3 F (36.8 C) 98.9 F (37.2 C)    TempSrc: Oral Oral    Resp: 16     Height:      Weight:   72.8 kg (160 lb 7.9 oz)   SpO2: 95% 94%  95%    Gen:  NAD Cardiovascular:  RRR, grade 3/6 systolic ejection murmur Respiratory: Lungs CTAB Gastrointestinal: Abdomen soft, NT/ND with normal active bowel sounds. Extremities: No C/E/C    Discharge Instructions:       Discharge Orders   Future Appointments Provider Department Dept Phone   02/16/2014 10:00 AM [facility] [phone]   02/16/2014 10:30 AM [facility] Covering Provider [facility] [phone]   Future Orders Complete By Expires   Call MD for:  difficulty breathing, headache or visual disturbances  As directed    Call MD for:  extreme fatigue  As directed    Call MD for:  persistant nausea and vomiting  As directed    Call MD for:  temperature >100.4  As directed    Diet - low sodium heart healthy  As directed    Discharge instructions  As directed    Increase activity slowly  As directed        Medication List    STOP taking these medications       furosemide 20 MG tablet  Commonly known as:  LASIX      TAKE these medications       albuterol 108 (90 BASE) MCG/ACT inhaler  Commonly known as:  PROVENTIL HFA;VENTOLIN HFA  Inhale 2 puffs into the lungs every 6 (six) hours as needed for wheezing or shortness of breath.     aspirin 81 MG EC tablet  Take 81 mg by mouth daily.     atorvastatin 20 MG tablet  Commonly known as:  LIPITOR  Take 20 mg by mouth daily.     budesonide-formoterol 160-4.5 MCG/ACT inhaler  Commonly known as:  SYMBICORT  Inhale 2 puffs into the lungs 2 (two) times daily.     CALCIUM 600 1500 MG Tabs  Generic drug:  Calcium Carbonate  Take 1 tablet by mouth daily.     HYDROcodone-acetaminophen 5-325 MG per tablet  Commonly known as:  NORCO/VICODIN  Take 1 tablet by mouth every 6 (six) hours as needed for moderate pain.     levothyroxine 50 MCG tablet  Commonly known as:  SYNTHROID, LEVOTHROID  Take 150 mcg by mouth daily.     omeprazole 20 MG capsule  Commonly known as:  PRILOSEC  Take 20 mg by mouth daily.     ondansetron 4 MG tablet  Commonly known as:  ZOFRAN  Take 1 tablet (4 mg total) by mouth every 6 (six) hours as needed for nausea.     ONE-A-DAY EXTRAS ANTIOXIDANT Caps  Take 1 capsule by mouth daily.     polyethylene glycol packet  Commonly known as:  MIRALAX / GLYCOLAX    Take 17 g by mouth daily.     senna-docusate 8.6-50 MG per tablet  Commonly known as:  Senokot-S  Take 1 tablet by mouth 2 (two) times daily.     sodium chloride 0.65 % Soln nasal spray  Commonly known as:  OCEAN  Place 1 spray into both nostrils as needed for congestion.     SUDAFED 24 HOUR PO  Take 1 tablet by mouth daily.     tiotropium 18 MCG inhalation capsule  Commonly known as:  SPIRIVA  Place 18 mcg into inhaler and inhale daily.     tolterodine 2 MG 24 hr capsule  Commonly known as:  DETROL LA  Take 2 mg by mouth daily.     VITAMIN D-3 PO  Take 1,000 Units by  mouth daily.     zolpidem 5 MG tablet  Commonly known as:  AMBIEN  Take 1 tablet (5 mg total) by mouth at bedtime as needed for sleep.       Follow-up Information   Follow up with ROBERTS, ALYSIA M, CCT. Schedule an appointment as soon as possible for a visit in 2 weeks. [facility] follow up.)    Contact information:   [place] [id number]        The results of significant diagnostics from this hospitalization (including imaging, microbiology, ancillary and laboratory) are listed below for reference.     Significant Diagnostic Studies:   Radiographs: Mr Knee Right Wo Contrast  01/18/2014   CLINICAL DATA:  Acute onset of right lateral knee pain.  EXAM: MRI OF THE RIGHT KNEE WITHOUT CONTRAST  TECHNIQUE: Multiplanar, multisequence MR imaging of the knee was performed. No intravenous contrast was administered.  COMPARISON:  None.  FINDINGS: MENISCI  Medial meniscus:  Normal.  Lateral meniscus:  Normal.  LIGAMENTS  Cruciates:  Normal.  Collaterals: There is an acute grade 1 sprain of the proximal LCL. This is superimposed on chronic changes in the proximal LCL indicating prior injury.  The MCL is normal.  CARTILAGE  Patellofemoral:  Normal.  Medial:  Normal.  Lateral:  Normal.  Joint:  Normal.  Popliteal Fossa:  Tiny Baker's cyst.  Extensor Mechanism:  Normal.  IMPRESSION: Acute grade 1 sprain  of the proximal LCL superimposed on chronic degenerative changes of the proximal LCL.Otherwise normal exam.   Electronically Signed   By: Rozetta Nunnery M.D.   On: 01/18/2014 18:51   Dg Chest Port 1 View  01/16/2014   CLINICAL DATA:  Cough, shortness of breath  EXAM: PORTABLE CHEST - 1 VIEW  COMPARISON:  DG CHEST 1V PORT dated 01/14/2014; DG CHEST 1V PORT dated 01/10/2014; DG CHEST 1V PORT dated 04/23/2012  FINDINGS: Grossly unchanged enlarged cardiac silhouette and mediastinal contours with atherosclerotic plaque within the thoracic aorta. Lungs remain hyperexpanded with flattening of the bilateral hemidiaphragms and thinning of the biapical pulmonary parenchyma, right greater than left. Pulmonary vasculature is indistinct with cephalization of flow. Worsening of bibasilar heterogeneous opacities, left greater than right. Suspected increased in small / trace bilateral effusions. No pneumothorax. Unchanged bones.  IMPRESSION: 1. Findings suggestive of worsening of pulmonary edema superimposed on advanced emphysematous change. 2. Worsening small/trace bilateral effusions and associated bibasilar opacities, left greater than right, atelectasis versus infiltrate.   Electronically Signed   By: Sandi Mariscal M.D.   On: 01/16/2014 14:15   Portable Chest 1 View  01/15/2014   CLINICAL DATA:  Fever, COPD  EXAM: PORTABLE CHEST - 1 VIEW  COMPARISON:  02/09/2014  FINDINGS: Chronic interstitial markings/emphysematous changes with bullous changes in the right upper lobe.  Mild left basilar opacity, likely atelectasis.  No pleural effusion or pneumothorax.  Cardiomegaly.  IMPRESSION: Chronic interstitial markings/emphysematous changes.  Mild left basilar opacity, likely atelectasis.   Electronically Signed   By: Julian Hy M.D.   On: 01/15/2014 00:06   Dg Chest Port 1 View  01/10/2014   CLINICAL DATA:  COPD. Bloody bowel movements. Hypertension. Asthma.  EXAM: PORTABLE CHEST - 1 VIEW  COMPARISON:  04/23/2012  FINDINGS:  Numerous leads and wires project over the chest. Midline trachea. Normal heart size with a tortuous, atherosclerotic aorta. No pleural effusion or pneumothorax. Bullous disease at the apices, greater on the right. Lower lobe predominant interstitial thickening which is moderate and not significantly  changed. No lobar consolidation. No congestive failure.  IMPRESSION: COPD/chronic bronchitis, without superimposed acute process.   Electronically Signed   By: Abigail Miyamoto M.D.   On: 01/10/2014 18:28    Labs:  Basic Metabolic Panel:  Recent Labs Lab 01/14/14 2320  01/16/14 0403 01/17/14 0315 01/18/14 0815 01/19/14 0312 01/20/14 0347  NA 143  < > 141 142 140 139 139  K 3.8  < > 3.6* 3.5* 3.4* 4.0 3.6*  CL 101  < > 101 98 94* 94* 95*  CO2 33*  < > 30 34* 34* 35* 35*  GLUCOSE 143*  < > 145* 121* 128* 121* 128*  BUN 14  < > _0 CREATININE 0.56  < > 0.69 0.62 1.02 1.37* 1.32*  CALCIUM 8.5  < > 8.4 8.6 8.9 9.2 9.3  MG 2.2  --   --   --   --   --   --   PHOS 2.5  --   --   --   --   --   --   < > = values in this interval not displayed. GFR Estimated Creatinine Clearance: 35.3 ml/min (by C-G formula based on Cr of 1.32). Liver Function Tests:  Recent Labs Lab 01/14/14 2320  AST 15  ALT 10  ALKPHOS 79  BILITOT 0.3  PROT 6.5  ALBUMIN 3.0*   CBC:  Recent Labs Lab 01/15/14 0455 01/16/14 0403 01/17/14 0315 01/18/14 0815 01/19/14 0312  WBC 8.7 11.4* 9.1 10.0 10.2  HGB 8.1* 8.9* 9.2* 9.8* 10.2*  HCT 26.1* 28.3* 29.8* 31.5* 32.7*  MCV 94.9 93.4 93.4 91.8 92.4  PLT 274 317 341 408* 401*   Cardiac Enzymes:  Recent Labs Lab 01/14/14 2320 01/15/14 0455 01/15/14 1005  TROPONINI <0.30 <0.30 <0.30   Microbiology Recent Results (from the past 240 hour(s))  CULTURE, BLOOD (ROUTINE X 2)     Status: None   Collection Time    01/14/14 11:20 PM      Result Value Ref Range Status   Specimen Description BLOOD LEFT ANTECUBITAL   Final   Special Requests BOTTLES DRAWN  AEROBIC ONLY 10CC   Final   Culture  Setup Time     Final   Value: 01/15/2014 03:30     Performed at [facility]   Culture     Final   Value:        BLOOD CULTURE RECEIVED NO GROWTH TO DATE CULTURE WILL BE HELD FOR 5 DAYS BEFORE ISSUING A FINAL NEGATIVE REPORT     Performed at [facility]   Report Status PENDING   Incomplete  CULTURE, BLOOD (ROUTINE X 2)     Status: None   Collection Time    01/14/14 11:30 PM      Result Value Ref Range Status   Specimen Description BLOOD LEFT HAND   Final   Special Requests BOTTLES DRAWN AEROBIC ONLY 4CC   Final   Culture  Setup Time     Final   Value: 01/15/2014 08:01     Performed at [facility]   Culture     Final   Value:        BLOOD CULTURE RECEIVED NO GROWTH TO DATE CULTURE WILL BE HELD FOR 5 DAYS BEFORE ISSUING A FINAL NEGATIVE REPORT     Performed at [facility]   Report Status PENDING   Incomplete  URINE CULTURE     Status: None   Collection Time  01/14/14 11:53 PM      Result Value Ref Range Status   Specimen Description URINE, CLEAN CATCH   Final   Special Requests NONE   Final   Culture  Setup Time     Final   Value: 01/15/2014 03:28     Performed at [facility]     Final   Value: NO GROWTH     Performed at [facility]   Culture     Final   Value: NO GROWTH     Performed at [facility]   Report Status 01/17/2014 FINAL   Final    Time coordinating discharge: 35 minutes.  SignedVenetia Maxon Millie Forde  Pager [phone] [facility] 01/20/2014, 10:12 AM

## 2014-01-20 NOTE — Progress Notes (Signed)
Occupational Therapy Treatment Patient Details Name: [name] MRN: [id number] DOB: 1933/07/01 Today's Date: 01/20/2014    History of present illness 80 yo female recently discharged after hospitalization for GI bleed secondary to diverticulosis and readmitted 5/1 for acute respiratory failure with hypoxia/fever and CAP, recently reporting R lateral knee pain after transferring with staff at [facility], MRI shows grade 1 sprain of LCL; PMHx of arthritis, breast cancer, CVA, oxygen dependent, dyspnea, anemia, PVD, HTN      Follow Up Recommendations  SNF    Equipment Recommendations  None recommended by OT       Precautions / Restrictions Precautions Precautions: Fall Precaution Comments: O2 dependent Required Braces or Orthoses:  (wearing neoprene stabilizing brace) Restrictions Weight Bearing Restrictions: No       Mobility Bed Mobility                  Transfers Overall transfer level: Needs assistance Equipment used: Rolling walker (2 wheeled) Transfers: Sit to/from Stand Sit to Stand: Min assist              Balance                                   ADL                           Toilet Transfer: Min guard;Ambulation;RW   Toileting- Clothing Manipulation and Hygiene: Supervision/safety;Sit to/from stand         General ADL Comments: Pt with many questions regarding DC, therapy at SNF and equipment for home. Pt most concerned about getting in and out of her bed at home which I assured her therapy would work on at [facility] prior to [place] [facility]. Instructed pt tp have sister measure  bed at home in preparation for practice.  Also provided bed rail option and pictures for home use which pt very appreciative.        Vision                     Perception     Praxis      Cognition   Behavior During Therapy: WFL for tasks assessed/performed Overall Cognitive Status: Within Functional Limits for tasks assessed                        Extremity/Trunk Assessment               Exercises     Shoulder Instructions        Prior Functioning/Environment              Frequency Min 2X/week     Progress Toward Goals  OT Goals(current goals can now be found in the care plan section)  Progress towards OT goals: Progressing toward goals     Plan      Co-evaluation                 End of Session     Activity Tolerance Patient tolerated treatment well   Patient Left with call bell/phone within reach;in chair   Nurse Communication          Time: 2440-1027 OT Time Calculation (min): 19 min  Charges: OT General Charges $OT Visit: 1 Procedure OT Treatments $Self Care/Home Management : 8-22 mins  Betsy Pries 01/20/2014, 10:15 AM

## 2014-01-20 NOTE — Progress Notes (Signed)
Pt for discharge to [facility].  CSW facilitated pt discharge needs including contacting facility, faxing pt discharge information via [facility], discussing with pt and pt niece at bedside, providing RN phone number to call report, and arranging ambulance transport for pt to [facility]. (Service Request ID#: [id number]).   No further social work needs identified at this time.  CSW signing off.   Alison Murray, MSW, [place] [facility] [phone]

## 2014-01-21 LAB — CULTURE, BLOOD (ROUTINE X 2)
CULTURE: NO GROWTH
Culture: NO GROWTH

## 2014-01-27 LAB — CREATININE, SERUM
Creatinine: 1.14 mg/dL (ref 0.60–1.30)
EGFR (African American): 53 — ABNORMAL LOW
GFR CALC NON AF AMER: 46 — AB

## 2014-01-31 ENCOUNTER — Telehealth: Payer: Self-pay

## 2014-01-31 LAB — CBC WITH DIFFERENTIAL/PLATELET
BASOS ABS: 0.1 10*3/uL (ref 0.0–0.1)
Basophil %: 0.6 %
Eosinophil #: 0.4 10*3/uL (ref 0.0–0.7)
Eosinophil %: 2.7 %
HCT: 32.5 % — ABNORMAL LOW (ref 35.0–47.0)
HGB: 10.5 g/dL — AB (ref 12.0–16.0)
LYMPHS ABS: 3 10*3/uL (ref 1.0–3.6)
LYMPHS PCT: 20 %
MCH: 29.1 pg (ref 26.0–34.0)
MCHC: 32.4 g/dL (ref 32.0–36.0)
MCV: 90 fL (ref 80–100)
Monocyte #: 1.4 x10 3/mm — ABNORMAL HIGH (ref 0.2–0.9)
Monocyte %: 9.1 %
NEUTROS PCT: 67.6 %
Neutrophil #: 10.1 10*3/uL — ABNORMAL HIGH (ref 1.4–6.5)
Platelet: 458 10*3/uL — ABNORMAL HIGH (ref 150–440)
RBC: 3.61 10*6/uL — AB (ref 3.80–5.20)
RDW: 15.6 % — ABNORMAL HIGH (ref 11.5–14.5)
WBC: 14.9 10*3/uL — AB (ref 3.6–11.0)

## 2014-01-31 LAB — FERRITIN: FERRITIN (ARMC): 45 ng/mL (ref 8–388)

## 2014-01-31 NOTE — Telephone Encounter (Signed)
S/w melissa we got hgb back and it is 10.5. Melissa was relieved at the value.

## 2014-01-31 NOTE — Telephone Encounter (Signed)
S/w melissa that pt is being dc'd from [facility] in [place] today. She is concerned b/c pt told her about dark stools this weekend. Cbc drawn but [name] told it would take 48 hours to get results. i called [facility] and was told the cbc is run at [facility] and i can call the lab later today to get results. Melissa was also concerned about 3 months between cbc checks at [facility]. The pt next appt is in 2 weeks and she can discuss with MD at that time.[name] expressed understanding and agreement with this plan.

## 2014-02-01 ENCOUNTER — Other Ambulatory Visit: Payer: Self-pay

## 2014-02-01 ENCOUNTER — Other Ambulatory Visit: Payer: Self-pay | Admitting: Internal Medicine

## 2014-02-01 ENCOUNTER — Telehealth: Payer: Self-pay | Admitting: *Deleted

## 2014-02-01 NOTE — Telephone Encounter (Signed)
Per staff phone call and POF I have schedueld appts.  JMW  

## 2014-02-02 ENCOUNTER — Telehealth: Payer: Self-pay | Admitting: *Deleted

## 2014-02-02 ENCOUNTER — Ambulatory Visit (HOSPITAL_BASED_OUTPATIENT_CLINIC_OR_DEPARTMENT_OTHER): Payer: Medicare Other

## 2014-02-02 VITALS — BP 147/80 | HR 94 | Temp 97.1°F

## 2014-02-02 DIAGNOSIS — D62 Acute posthemorrhagic anemia: Secondary | ICD-10-CM

## 2014-02-02 DIAGNOSIS — D509 Iron deficiency anemia, unspecified: Secondary | ICD-10-CM

## 2014-02-02 DIAGNOSIS — K922 Gastrointestinal hemorrhage, unspecified: Secondary | ICD-10-CM

## 2014-02-02 MED ORDER — SODIUM CHLORIDE 0.9 % IV SOLN
Freq: Once | INTRAVENOUS | Status: AC
  Administered 2014-02-02: 12:00:00 via INTRAVENOUS

## 2014-02-02 MED ORDER — SODIUM CHLORIDE 0.9 % IV SOLN
1020.0000 mg | Freq: Once | INTRAVENOUS | Status: AC
  Administered 2014-02-02: 1020 mg via INTRAVENOUS
  Filled 2014-02-02: qty 34

## 2014-02-02 NOTE — Patient Instructions (Signed)

## 2014-02-02 NOTE — Telephone Encounter (Signed)
Lmom to sched an appt for 1 yr f/u on carotid doppler

## 2014-02-15 ENCOUNTER — Telehealth: Payer: Self-pay

## 2014-02-15 NOTE — Telephone Encounter (Signed)
Labs dated 02/14/14 received and forwarded to Dr Juliann Mule

## 2014-02-16 ENCOUNTER — Telehealth: Payer: Self-pay | Admitting: Internal Medicine

## 2014-02-16 ENCOUNTER — Ambulatory Visit (HOSPITAL_BASED_OUTPATIENT_CLINIC_OR_DEPARTMENT_OTHER): Payer: Medicare Other | Admitting: Internal Medicine

## 2014-02-16 ENCOUNTER — Other Ambulatory Visit: Payer: Medicare Other

## 2014-02-16 VITALS — BP 138/71 | HR 108 | Temp 97.6°F | Resp 18 | Ht 66.0 in | Wt 161.1 lb

## 2014-02-16 DIAGNOSIS — K922 Gastrointestinal hemorrhage, unspecified: Secondary | ICD-10-CM

## 2014-02-16 DIAGNOSIS — D500 Iron deficiency anemia secondary to blood loss (chronic): Secondary | ICD-10-CM

## 2014-02-16 NOTE — Progress Notes (Signed)
[facility] OFFICE PROGRESS NOTE  [name], MD [place] [address]  DIAGNOSIS: GI bleed - Plan: CBC with Differential, Comprehensive metabolic panel (Cmet) - [facility], Lactate dehydrogenase (LDH) - [facility], Iron and TIBC [facility], Ferritin  Chief Complaint  Patient presents with  . Follow-up    CURRENT THERAPY: Feraheme 1020 mg IV prn.  Currently we are checking CBC and ferritin every month (per her PCP's office).  INTERVAL HISTORY: [name] 80 y.o. female with a a history of anemia secondary to iron deficiency and gastrointestinal bleeding from colonic arteriovenous malformations is here for follow-up.  She was last seen by me on 10/19/2013.   She had two hospitalizations.  She was admitted for  anemia on 04/27 and discharged on 04/29.  She was followed by Dr. Carlean Purl with a colonoscopy on 04/29 revealing a redundant colon with inability to access the cecum and severe diverticulosis. She had a second more hospitalization due to hospital acquired pnuemonia (from 05/01 till 05/07).  Today, she is accompanied by her niece [place].   She continues to check her labs every one month.   On 02/02/2014, the patient came in for IV Feraheme 1020 mg. She tolerates her infusions well. She denies pagophagia or any hematochezia or melena. She remains asymptomatic for symptoms of anemia   MEDICAL HISTORY: Past Medical History  Diagnosis Date  . COPD (chronic obstructive pulmonary disease)   . Hypertrophic obstructive cardiomyopathy     Echo 10/10 EF 65-70% SW 1.6 PW 1.4 mild SAM no LVOT gradient at rest. Valsalva not performed Mild MR. No hyperenhance ment on MRI EF 69%.  . Coronary atherosclerosis of native coronary artery     Cath 1/11: RCA 50-60 ostial o/w normal. EF 70%.   . Pulmonary hypertension     Cath 1/11 RA 8, RV 43/6/14, PA 42/17 (29) PCWP 15 Fick  5.0/2.6. PVR 2.8    . Edema   . Occlusion and stenosis of carotid artery     s/p L CEA u/s 3/11. R 60-79%;  L 40-59%. no change since 3/10  . Peripheral vascular disease, unspecified   . Hypertension   . Emphysema   . Asthma   . Hypothyroidism   . Allergic rhinitis   . Gastric AVM   . Heart murmur   . Anginal pain     "pressure"  . Emphysema   . Oxygen dependent 04/23/2012    "concentrated at night; liquid during day"  . Dyspnea 04/23/2012    "all the time"  . Anemia 04/23/2012    "severe @ times; think caused by colonic AVMs"  . History of blood transfusion     "many times"  . H/O hiatal hernia   . Stroke ~ 2004    denies residual on 04/23/2012  . Arthritis     "probably"  . Breast cancer   . Pneumonia     INTERIM HISTORY: has HYPOTHYROIDISM; HYPERTENSION; CAD, NATIVE VESSEL; PULMONARY HYPERTENSION, SECONDARY; Hypertrophic obstructive cardiomyopathy; CAROTID ARTERY DISEASE; C V A / STROKE; PVD; ALLERGIC RHINITIS; EMPHYSEMA; ASTHMA; COPD; EDEMA; DYSPNEA; ABNORMAL ECHOCARDIOGRAM; ABNORMAL EKG; BREAST CANCER, HX OF; HYPOTHYROIDISM; GERD (gastroesophageal reflux disease); Exertional angina; Chest pain on exertion; CAD (coronary artery disease); Microcytic anemia; Angiodysplasia of intestine with hemorrhage; Nonspecific abnormal finding in stool contents; GI bleed; Acute blood loss anemia; Fever; Sepsis; Acute renal failure; Normocytic anemia; Elevated brain natriuretic peptide (BNP) level; HCAP (healthcare-associated pneumonia); and Diastolic CHF, acute on chronic on her problem list.  ALLERGIES:  is allergic to latex.  MEDICATIONS: has a current medication list which includes the following prescription(s): albuterol, aspirin, atorvastatin, budesonide-formoterol, calcium carbonate, cholecalciferol, hydrocodone-acetaminophen, levothyroxine, one-a-day extras antioxidant, omeprazole, ondansetron, polyethylene glycol, pseudoephedrine hcl, senna-docusate, sodium chloride, tiotropium, tolterodine, and zolpidem.  SURGICAL HISTORY:  Past Surgical History  Procedure Laterality Date  . Carotid  endarterectomy  2005    left  . Thyroidectomy  2007  . Appendectomy  1950  . Breast lumpectomy  2002    right-hx of radiation  . Dilation and curettage of uterus  1950's  . Cataract extraction w/ intraocular lens implant  02/2011    right  . Excisional hemorrhoidectomy  1950's  . Cardiac catheterization    . Colonoscopy N/A 01/12/2014    Procedure: COLONOSCOPY;  Surgeon: Gatha Mayer, MD;  Location: [facility];  Service: Endoscopy;  Laterality: N/A;  MAC if available   PROBLEM LIST:  1. Anemia secondary to iron deficiency and gastrointestinal bleeding from colonic arteriovenous malformations. The patient received IV INFeD 1025 mg on 04/24/2012 when she was hospitalized. She has also received Feraheme 1020 mg IV on 07/17/2012, 08/21/2012 and 12/07/2012.  2. Chronic obstructive pulmonary disease (COPD) diagnosed in 1999, currently on home oxygen at 3 L/minute at rest and during sleep. The patient uses 4 L per minute with exertion. She has been on home oxygen for approximately the past 5 years.  3. Pulmonary hypertension.  4. Right frontal arteriovenous malformation (AVM).  5. Coronary artery disease.  6. Diastolic dysfunction.  7. Past history of multiple strokes.  8. History of left carotid stenosis with left carotid endarterectomy  in 2006 at [facility].  9. Hypertension.  10. Dyslipidemia.  11. Ductal carcinoma in situ involving the right breast, status post  lumpectomy, radiation, and 5 years of tamoxifen.  12. Peripheral vascular disease.  13. Hiatal hernia.  14. Status post thyroidectomy in 2009 or 2010.  15. Osteoporosis.  16. Stroke involving right eye in April 2011, now with vision restored.  17. Diverticulosis.  18. Hearing impairment.  19. Systolic ejection murmur.  20. Hoarseness noted around December 2013. This was evaluated by an ENT specialist.   REVIEW OF SYSTEMS:   Constitutional: Denies fevers, chills or abnormal weight loss Eyes: Denies blurriness of  vision Ears, nose, mouth, throat, and face: Denies mucositis or sore throat Respiratory: Denies cough, dyspnea or wheezes Cardiovascular: Denies palpitation, chest discomfort or lower extremity swelling Gastrointestinal:  Denies nausea, heartburn or change in bowel habits Skin: Denies abnormal skin rashes Lymphatics: Denies new lymphadenopathy or easy bruising Neurological:Denies numbness, tingling or new weaknesses Behavioral/Psych: Mood is stable, no new changes  All other systems were reviewed with the patient and are negative.  PHYSICAL EXAMINATION: ECOG PERFORMANCE STATUS: 0 - Asymptomatic  Blood pressure 138/71, pulse 108, temperature 97.6 F (36.4 C), temperature source Oral, resp. rate 18, height 5\' 6"  (1.676 m), weight 161 lb 1.6 oz (73.074 kg), SpO2 97.00%.  GENERAL:alert, no distress and comfortable; elderly female who appears her stated age.  SKIN: skin color, texture, turgor are normal, no rashes or significant lesions EYES: normal, Conjunctiva are pink and non-injected, sclera clear OROPHARYNX:no exudate, no erythema and lips, buccal mucosa, and tongue normal  NECK: supple, thyroid normal size, non-tender, without nodularity LYMPH:  no palpable lymphadenopathy in the cervical, axillary or supraclavicular LUNGS: clear to auscultation and percussion with normal breathing effort HEART: regular rate & rhythm and no murmurs and no lower extremity edema ABDOMEN:abdomen soft, non-tender and normal bowel sounds  Musculoskeletal:no cyanosis of digits and no clubbing  NEURO: alert & oriented x 3 with fluent speech, no focal motor/sensory deficits  LABORATORY DATA: No results found for this or any previous visit (from the past 48 hour(s)).  Labs:  Lab Results  Component Value Date   WBC 10.2 01/19/2014   HGB 10.2* 01/19/2014   HCT 32.7* 01/19/2014   MCV 92.4 01/19/2014   PLT 401* 01/19/2014   NEUTROABS 6.0 01/10/2014      Chemistry      Component Value Date/Time   NA 139 01/20/2014  0347   NA 143 10/19/2013 0958   NA 144 06/29/2012 1130   K 3.6* 01/20/2014 0347   K 4.2 10/19/2013 0958   CL 95* 01/20/2014 0347   CL 101 03/08/2013 1333   CO2 35* 01/20/2014 0347   CO2 32* 10/19/2013 0958   BUN 22 01/20/2014 0347   BUN 14.9 10/19/2013 0958   BUN 17 06/29/2012 1130   CREATININE 1.32* 01/20/2014 0347   CREATININE 0.7 10/19/2013 0958      Component Value Date/Time   CALCIUM 9.3 01/20/2014 0347   CALCIUM 9.8 10/19/2013 0958   ALKPHOS 79 01/14/2014 2320   ALKPHOS 92 10/19/2013 0958   AST 15 01/14/2014 2320   AST 22 10/19/2013 0958   ALT 10 01/14/2014 2320   ALT 17 10/19/2013 0958   BILITOT 0.3 01/14/2014 2320   BILITOT 0.53 10/19/2013 0958     Basic Metabolic Panel: No results found for this basename: NA, K, CL, CO2, GLUCOSE, BUN, CREATININE, CALCIUM, MG, PHOS,  in the last 168 hours GFR Estimated Creatinine Clearance: 35.4 ml/min (by C-G formula based on Cr of 1.32). Liver Function Tests: No results found for this basename: AST, ALT, ALKPHOS, BILITOT, PROT, ALBUMIN,  in the last 168 hours CBC: No results found for this basename: WBC, NEUTROABS, HGB, HCT, MCV, PLT,  in the last 168 hours Anemia work up No results found for this basename: VITAMINB12, FOLATE, FERRITIN, TIBC, IRON, RETICCTPCT,  in the last 72 hours Studies:  No results found.   RADIOGRAPHIC STUDIES: No results found.  ASSESSMENT: [name] 80 y.o. female with a history of GI bleed - Plan: CBC with Differential, Comprehensive metabolic panel (Cmet) - [facility], Lactate dehydrogenase (LDH) - [facility], Iron and TIBC [facility], Ferritin   PLAN:  1. Anemia secondary to iron deficiency and GI bleeding.  --She continues to recover from her recent hospitalizations.   Her iron studies done at her PCP's office showed a ferritin of 455.  Her hemoglobin was 10.6.  She remains asymptomatic for anemia. We will check her labs every monthly.  She was counseled should she experience any symptoms of anemia, to please call and we will arrange for complete  blood count to be collected.   2. Follow-up. -- Ms. Bold will return in 2 months, at which time we will check CBC, chemistries, iron TIBC and ferritin.  All questions were answered. The patient knows to call the clinic with any problems, questions or concerns. We can certainly see the patient much sooner if necessary.  I spent 15 minutes counseling the patient face to face. The total time spent in the appointment was 25 minutes.    Concha Norway, MD 02/16/2014 2:30 PM

## 2014-02-16 NOTE — Patient Instructions (Signed)
Hyperkalemia Hyperkalemia is when you have too much potassium in your blood. This can be a life-threatening condition. Potassium is normally removed (excreted) from the body by the kidneys. CAUSES  The potassium level in your body can become too high for the following reasons:  You take in too much potassium. You can do this by:  Using salt substitutes. They contain large amounts of potassium.  Taking potassium supplements from your caregiver. The dose may be too high for you.  Eating foods or taking nutritional products with potassium.  You excrete too little potassium. This can happen if:  Your kidneys are not functioning properly. Kidney (renal) disease is a very common cause of hyperkalemia.  You are taking medicines that lower your excretion of potassium, such as certain diuretic medicines.  You have an adrenal gland disease called Addison's disease.  You have a urinary tract obstruction, such as kidney stones.  You are on treatment to mechanically clean your blood (dialysis) and you skip a treatment.  You release a high amount of potassium from your cells into your blood. You may have a condition that causes potassium to move from your cells to your bloodstream. This can happen with:  Injury to muscles or other tissues. Most potassium is stored in the muscles.  Severe burns or infections.  Acidic blood plasma (acidosis). Acidosis can result from many diseases, such as uncontrolled diabetes. SYMPTOMS  Usually, there are no symptoms unless the potassium is dangerously high or has risen very quickly. Symptoms may include:  Irregular or very slow heartbeat.  Feeling sick to your stomach (nauseous).  Tiredness (fatigue).  Nerve problems such as tingling of the skin, numbness of the hands or feet, weakness, or paralysis. DIAGNOSIS  A simple blood test can measure the amount of potassium in your body. An electrocardiogram test of the heart can also help make the diagnosis.  The heart may beat dangerously fast or slow down and stop beating with severe hyperkalemia.  TREATMENT  Treatment depends on how bad the condition is and on the underlying cause.  If the hyperkalemia is an emergency (causing heart problems or paralysis), many different medicines can be used alone or together to lower the potassium level briefly. This may include an insulin injection even if you are not diabetic. Emergency dialysis may be needed to remove potassium from the body.  If the hyperkalemia is less severe or dangerous, the underlying cause is treated. This can include taking medicines if needed. Your prescription medicines may be changed. You may also need to take a medicine to help your body get rid of potassium. You may need to eat a diet low in potassium. HOME CARE INSTRUCTIONS   Take medicines and supplements as directed by your caregiver.  Do not take any over-the-counter medicines, supplements, natural products, herbs, or vitamins without reviewing them with your caregiver. Certain supplements and natural food products can have high amounts of potassium. Other products (such as ibuprofen) can damage weak kidneys and raise your potassium.  You may be asked to do repeat lab tests. Be sure to follow these directions.  If you have kidney disease, you may need to follow a low potassium diet. SEEK MEDICAL CARE IF:   You notice an irregular or very slow heartbeat.  You feel lightheaded.  You develop weakness that is unusual for you. SEEK IMMEDIATE MEDICAL CARE IF:   You have shortness of breath.  You have chest discomfort.  You pass out (faint). MAKE SURE YOU:   Understand   You develop weakness that is unusual for you.  SEEK IMMEDIATE MEDICAL CARE IF:    You have shortness of breath.   You have chest discomfort.   You pass out (faint).  MAKE SURE YOU:    Understand these instructions.   Will watch your condition.   Will get help right away if you are not doing well or get worse.  Document Released: 08/23/2002 Document Revised: 11/25/2011 Document Reviewed: 02/07/2011  [employer] Patient Information 2014 [employer].

## 2014-02-16 NOTE — Telephone Encounter (Signed)
gv and printed appt sched and avs for pt for Aug °

## 2014-03-21 ENCOUNTER — Telehealth: Payer: Self-pay

## 2014-03-21 NOTE — Telephone Encounter (Signed)
Labs dated 03/15/14 received from [facility] and forwarded to Dr Juliann Mule

## 2014-03-25 ENCOUNTER — Ambulatory Visit (INDEPENDENT_AMBULATORY_CARE_PROVIDER_SITE_OTHER): Payer: Medicare Other | Admitting: Emergency Medicine

## 2014-03-25 ENCOUNTER — Encounter: Payer: Self-pay | Admitting: Emergency Medicine

## 2014-03-25 VITALS — BP 126/74 | HR 109 | Ht 66.0 in | Wt 162.0 lb

## 2014-03-25 DIAGNOSIS — I2510 Atherosclerotic heart disease of native coronary artery without angina pectoris: Secondary | ICD-10-CM

## 2014-03-25 DIAGNOSIS — J449 Chronic obstructive pulmonary disease, unspecified: Secondary | ICD-10-CM

## 2014-03-25 NOTE — Progress Notes (Signed)
Subjective:    Patient ID: [name], female    DOB: May 01, 1933, 80 y.o.   MRN: [id number] HPI 21 former smoker, dx with COPD about 1999, also hx breast CA, CVA with R frontal AVM, colonic AVM's. Also diastolic dysfxn followed Dr Haroldine Laws. Was treated with Spiriva + Advair, since 2003. Now on Symbicort  10/30/10 -- follow up severe COPD. Flare last visit, treated with pred and abx, changed Advair to Symbicort to see if this helped with UA irritation and cough. We left her on Pred 10mg  by mouth once daily with plans to f/u and decide whether to stay on chronically.   02/14/11 Acute OV  Pt presents for a work in visit. Complains of increased SOB w/ exertion and sats into 70s w/ exertion, HR varying from the upper 40s to the 120s. Pt complains that over last couple of weeks she feels tired , low energy and more short of breath with walking. She has marathon helios that she has noticed she desats with walking into the 70s. She uses the demand setting. On the continuous flow she does not desat. Today in the office no desaturation with walking on 3 l/m continuous flow. HR ranges 100-120 with walking and at rest. On her portable oximeter her HR avg 40-120 , in the office HR maintained ~106-120 with walking . EKG w/ no acute changes w/ HR ~90 at rest. Does feel tight in chest with walking. Has some occasional wheezing. No discolored mucus or fever. Ankles have been puffy for last several days.  Recently changed from her bisoprolol to cardizem by cardiology.  ROV 03/05/11 -- severe COPD w hypoxemia. As above, recently seen for DOE and labile HR. Since last time was admitted for anemia, Hgb 6.0. In aftermath feels her breathing and fatigue are improved. Returns today for f/u. She had to have AVM's cauterized. Has been on chronic pred for last 6 months, currently on 5mg  daily. Her goal is to taper to zero. Currently on Symbicort, Spiriva, uses SABA prn not every day.   ROV 04/03/11 -- severe COPD. Last time we  decreased Pred to 3mg  qd. Since our visit she was readmitted for anemia, she is being followed by GI (Dr Vira Agar). She has had endoscopy, CSY, capsule endoscopy. She feels washed out from the anemia. No real breathing change, wheezing, coughing.   ROV 06/04/11 - written note. Decreased Pred to 1mg  qd.  ROV 07/29/11 -- severe COPD with hypoxemia, tapering steroids. Last time we decreased to 1mg  daily.  She presents with worsening dyspnea, wheeze. No real cough. Her SABA use has increased over the last week. She is less mobile in the home, needed wheelchair.  >>admitted   08/12/2011 [facility]  Admitted 11/12-11/15 for AECOPD , tx w/ IV abx , steroids and nebs. CXR without acute process.  Discharged on zpack and steroid taper. She is feeling better with decreased cough and dyspnea  Continued on Spiriva and Symbicort .  Currently on prednisone 10mg  daily  No chest pain , discolored mucus or fever   ROV 09/02/11 -- Severe COPD, has been tapering chronic pred. Hosp for AE in early Nov '12. Prednisone is down to 5mg  now. She has experienced B LE edema over the last month (? Can due to pred).   ROV 10/03/11 -- Severe COPD, has been tapering chronic pred. Last time we decreased pred from 5mg  to 3mg  daily. She tells me that she is still having problems w anemia (has had colonic AVM cauterized before). Hx  of diastolic dysfxn, ? R heart failure. She is planning to see cards today, may need diuretics adjusted.   ROV 12/03/11 -- Severe COPD, diastolic dysfxn, tapering chronic pred currently on 3mg . She is on Zyrtec, has been having some red eyes. Wearing O2 at night and prn. On spiriva + symbicort + SABA, averages once every few weeks.   ROV 01/28/12 -- Severe COPD, diastolic dysfxn, tapering chronic pred, last time decreased to 2mg  qd. She is a bit less active over the last year. Having good days and bad. She uses her O2 prn and at night. She has not flared.   02/24/2012 Acute OV  Complains of productive  cough,sob ,wheezing for 4 days.  Barky cough , worse for last few days Currently on prednisone 2mg   No hemotpysis or increased edema. No fever.   ROV 03/06/12 -- Severe COPD, diastolic dysfxn, tag pering chronic pred, last time decreased to 2mg  qd.  She was recently  Caught URI, was seen and treated as above. Now tapered back down to 2mg  Pred qd.  She continues to have cough and fatigue. The cough is non-prod. She is still having nasal gtt and throat congestion.   Follow up 03/13/12 -  Last visit with COPD flare , tx Levaquin and Prednisone taper  Returns today feeling better. Has few days left of prednisone. Last day of Levaquin.  CXR last ov w/ no acute finding.  Cough and wheezing are decreased but not gone.  Mucus is clear only. No fever.  Currently on prednisone 20mg  daily  No hemoptysis or edema.   ROV 04/23/12 -- Severe COPD, HTN + diastolic dysfxn, hypoxemia. Has been on chronic pred, recent AE as above. Tapered down to Pred 5mg  since another burst 6/28. She presents today with worsening SOB, chest pain especially with exertion or talking. This has been happening for about 1.5 week. She also has more LE edema, more wheezing. She has increased her ProAir use but it hasn't helped very much. Taking protonix qd.    ROV 06/01/12 -- Severe COPD, HTN + diastolic dysfxn, hypoxemia. Has been on chronic pred. Last time I saw her she was admitted to [facility] for suspected angina related to anemia. She is having exertional CP and dyspnea. Her last Hgb (her report) was 12.2. Seen by Dr Olevia Perches in the hospital.   ROV 08/03/12 -- Severe COPD, HTN + diastolic dysfxn, hypoxemia. Also with CAD and hx symptomatic anemia. Has been on chronic pred > current dose 5mg . Stable breathing (still w DOE). No flares since last hospitalization. Recently started on diuretic by PCP, following wt.   ROV 09/17/12 -- Severe COPD, HTN + diastolic dysfxn, hypoxemia. Also with CAD and hx symptomatic anemia. Last time we decreased pred  from 5mg  to 3mg  qd. No flares. Exercise tolerance is stable - does the step machine every day. Wears her o2 w exertion and w sleep. We have had her pred as low as 1mg  in the past, but have not weaned completely off in over 2 years.   ROV 11/12/12 -- Severe COPD, HTN + diastolic dysfxn, hypoxemia. Also with CAD and hx symptomatic anemia. We have been weaning pred - had to go back up in January when she had some flaring, but now has weaned back down to 2mg  qd. She is having hoarse throat, lots of clearing. On flonase and protonix.   ROV 03/04/13 -- Severe COPD, HTN + diastolic dysfxn, hypoxemia. Also with CAD and hx symptomatic anemia. We have weaned pred down  to 1mg  daily. Her breathing is at baseline. Spiriva + symbicort, rare albuterol. She has some fatigue, low appetite. She has an episode of L lower CP 6/18. She thought is was GERD.   ROV 04/14/13 -- Severe COPD, HTN + diastolic dysfxn, hypoxemia. Also with CAD and hx symptomatic anemia (with dyspnea). Last visit we weaned her prednisone to off, this has been done slowly over several months. Remains on Symbicort, Spiriva. Has been taking Symbicort qhs instead of BID. She has done well off the pred x 1 month, but for the last 3 days more DOE and wheeze, resolved by albuterol. No cough or URI sx, no PND.    ROV 05/14/13 -- Severe COPD, HTN + diastolic dysfxn, hypoxemia, symptomatic anemia. Last time we increased her symbicort to bid. Her breathing is doing well. She is able to exert some, but limited. She is not wheezing.   ROV 10/08/13 -- Severe COPD, HTN + diastolic dysfxn, hypoxemia, symptomatic anemia. She follows with Dr Juliann Mule with H/O for her Fe-def and anemia. She is breathing fairly well, did have some difficulty today with walking into the office. She is on Symbicort, Spiriva. Off prednisone. Uses albuterol a couple times a week.   ROV 03/25/14 -- Severe COPD, HTN + diastolic dysfxn, hypoxemia, anemia.  She was unfortunately admitted for PNA after  she was seen in ED for GIB. She went to rehab x 14 days and now back home. Her breathing is back to baseline. She is using O2 for goal SpO2 > 90%.  On symbicort + spiriva, uses albuterol once a week.   Objective:  Physical Exam Filed Vitals:   03/25/14 1346  BP: 126/74  Pulse: 109  Height: 5\' 6"  (1.676 m)  Weight: 162 lb (73.483 kg)  SpO2: 90%   GEN: A/Ox3; pleasant , NAD, elderly female   HEENT:  /AT,  EACs-clear, TMs-wnl, NOSE-clear, THROAT-clear, no lesions, no postnasal drip or exudate noted.   NECK:  Supple w/ fair ROM; no JVD; normal carotid impulses w/o bruits; no thyromegaly or nodules palpated; no lymphadenopathy.  RESP  Few soft basilar insp crackles B, B exp wheezes L > R  CARD:  RRR, Gr 3/6 holosystolic blowing M without an S2, loudest at R sternal border  Musco: Warm bil, no deformities or joint swelling noted.   Neuro: alert, no focal deficits noted.    Skin: Warm, no lesions or rashes   Assessment & Plan:   COPD Currently stable from a resp standpoint, although she did have a hospitalization for GIB, possible PNA in March. She is now back to baseline.   Please continue your medications as you are taking them  Wear your oxygen with exertion  Follow with Dr Lamonte Sakai in 4 months  Get the flu shot this Fall

## 2014-03-25 NOTE — Assessment & Plan Note (Addendum)
Currently stable from a resp standpoint, although she did have a hospitalization for GIB, possible PNA in March. She is now back to baseline.   Please continue your medications as you are taking them  Wear your oxygen with exertion  Follow with Dr Lamonte Sakai in 4 months  Get the flu shot this Fall

## 2014-03-25 NOTE — Patient Instructions (Signed)
Please continue your medications as you are taking them  Wear your oxygen with exertion  Follow with Dr Lamonte Sakai in 4 months  Get the flu shot this Fall

## 2014-03-28 ENCOUNTER — Telehealth: Payer: Self-pay | Admitting: Medical Oncology

## 2014-03-28 ENCOUNTER — Other Ambulatory Visit: Payer: Self-pay | Admitting: Internal Medicine

## 2014-03-28 ENCOUNTER — Other Ambulatory Visit: Payer: Self-pay | Admitting: Medical Oncology

## 2014-03-28 NOTE — Telephone Encounter (Signed)
[name]- niece called stating her aunt just had labs drawn July 1 at [facility]. Her ferritin was 62. Her last ferritin was drawn here at [facility] 02/14/14 and her level was 450. She would like to know if Dr. Juliann Mule could review and set her up for feraheme. I spoke with Dr. Juliann Mule and he is going to set the pt up for IV feraheme. Melissa and pt are aware they will get a call for her for appointment times.

## 2014-03-29 ENCOUNTER — Telehealth: Payer: Self-pay | Admitting: *Deleted

## 2014-03-29 NOTE — Telephone Encounter (Signed)
Per staff message and POF I have scheduled appts. Patient called. JMW  

## 2014-03-31 NOTE — Telephone Encounter (Signed)
Close encounter 

## 2014-04-01 ENCOUNTER — Ambulatory Visit (HOSPITAL_BASED_OUTPATIENT_CLINIC_OR_DEPARTMENT_OTHER): Payer: Medicare Other

## 2014-04-01 VITALS — BP 135/71 | HR 92 | Temp 98.1°F | Resp 18

## 2014-04-01 DIAGNOSIS — D62 Acute posthemorrhagic anemia: Secondary | ICD-10-CM

## 2014-04-01 DIAGNOSIS — D500 Iron deficiency anemia secondary to blood loss (chronic): Secondary | ICD-10-CM

## 2014-04-01 DIAGNOSIS — K922 Gastrointestinal hemorrhage, unspecified: Secondary | ICD-10-CM

## 2014-04-01 DIAGNOSIS — K31811 Angiodysplasia of stomach and duodenum with bleeding: Secondary | ICD-10-CM

## 2014-04-01 MED ORDER — SODIUM CHLORIDE 0.9 % IJ SOLN
3.0000 mL | Freq: Once | INTRAMUSCULAR | Status: DC | PRN
  Filled 2014-04-01: qty 10

## 2014-04-01 MED ORDER — SODIUM CHLORIDE 0.9 % IV SOLN
INTRAVENOUS | Status: DC
Start: 2014-04-01 — End: 2014-04-01
  Administered 2014-04-01: 11:00:00 via INTRAVENOUS

## 2014-04-01 MED ORDER — FERUMOXYTOL INJECTION 510 MG/17 ML
1020.0000 mg | Freq: Once | INTRAVENOUS | Status: AC
  Administered 2014-04-01: 1020 mg via INTRAVENOUS
  Filled 2014-04-01: qty 34

## 2014-04-01 NOTE — Patient Instructions (Signed)

## 2014-04-28 ENCOUNTER — Telehealth: Payer: Self-pay

## 2014-04-28 NOTE — Telephone Encounter (Signed)
Labs results from 04/15/14 received. Shown to Dr Juliann Mule. No feraheme needed at present. Continue monthly labs. F/w visit 8/26.

## 2014-05-11 ENCOUNTER — Other Ambulatory Visit (HOSPITAL_BASED_OUTPATIENT_CLINIC_OR_DEPARTMENT_OTHER): Payer: Medicare Other

## 2014-05-11 ENCOUNTER — Ambulatory Visit (HOSPITAL_BASED_OUTPATIENT_CLINIC_OR_DEPARTMENT_OTHER): Payer: Medicare Other | Admitting: Internal Medicine

## 2014-05-11 ENCOUNTER — Telehealth: Payer: Self-pay | Admitting: Internal Medicine

## 2014-05-11 VITALS — BP 136/56 | HR 86 | Temp 97.9°F | Resp 18 | Ht 66.0 in | Wt 162.3 lb

## 2014-05-11 DIAGNOSIS — D509 Iron deficiency anemia, unspecified: Secondary | ICD-10-CM

## 2014-05-11 DIAGNOSIS — K922 Gastrointestinal hemorrhage, unspecified: Secondary | ICD-10-CM

## 2014-05-11 LAB — IRON AND TIBC CHCC
%SAT: 28 % (ref 21–57)
IRON: 90 ug/dL (ref 41–142)
TIBC: 318 ug/dL (ref 236–444)
UIBC: 227 ug/dL (ref 120–384)

## 2014-05-11 LAB — COMPREHENSIVE METABOLIC PANEL (CC13)
ALT: 13 U/L (ref 0–55)
AST: 18 U/L (ref 5–34)
Albumin: 4.1 g/dL (ref 3.5–5.0)
Alkaline Phosphatase: 84 U/L (ref 40–150)
Anion Gap: 7 mEq/L (ref 3–11)
BILIRUBIN TOTAL: 0.39 mg/dL (ref 0.20–1.20)
BUN: 20.7 mg/dL (ref 7.0–26.0)
CALCIUM: 9.5 mg/dL (ref 8.4–10.4)
CHLORIDE: 100 meq/L (ref 98–109)
CO2: 35 mEq/L — ABNORMAL HIGH (ref 22–29)
Creatinine: 0.8 mg/dL (ref 0.6–1.1)
GLUCOSE: 100 mg/dL (ref 70–140)
Potassium: 4.3 mEq/L (ref 3.5–5.1)
Sodium: 142 mEq/L (ref 136–145)
Total Protein: 7.4 g/dL (ref 6.4–8.3)

## 2014-05-11 LAB — CBC WITH DIFFERENTIAL/PLATELET
BASO%: 1.3 % (ref 0.0–2.0)
BASOS ABS: 0.1 10*3/uL (ref 0.0–0.1)
EOS ABS: 0.3 10*3/uL (ref 0.0–0.5)
EOS%: 3 % (ref 0.0–7.0)
HEMATOCRIT: 41.6 % (ref 34.8–46.6)
HEMOGLOBIN: 13.4 g/dL (ref 11.6–15.9)
LYMPH%: 29.8 % (ref 14.0–49.7)
MCH: 29.7 pg (ref 25.1–34.0)
MCHC: 32.3 g/dL (ref 31.5–36.0)
MCV: 91.9 fL (ref 79.5–101.0)
MONO#: 0.9 10*3/uL (ref 0.1–0.9)
MONO%: 10 % (ref 0.0–14.0)
NEUT#: 4.8 10*3/uL (ref 1.5–6.5)
NEUT%: 55.9 % (ref 38.4–76.8)
PLATELETS: 250 10*3/uL (ref 145–400)
RBC: 4.52 10*6/uL (ref 3.70–5.45)
RDW: 15.2 % — ABNORMAL HIGH (ref 11.2–14.5)
WBC: 8.7 10*3/uL (ref 3.9–10.3)
lymph#: 2.6 10*3/uL (ref 0.9–3.3)

## 2014-05-11 LAB — FERRITIN CHCC: Ferritin: 142 ng/ml (ref 9–269)

## 2014-05-11 LAB — LACTATE DEHYDROGENASE (CC13): LDH: 170 U/L (ref 125–245)

## 2014-05-11 NOTE — Telephone Encounter (Signed)
[facility] PT APPT SCHEDULE FOR NOV.  °

## 2014-05-11 NOTE — Progress Notes (Signed)
[facility] OFFICE PROGRESS NOTE  Victoria Nakayama, MD [place] [address]  DIAGNOSIS: Microcytic anemia  Chief Complaint  Patient presents with  . Microcytic anemia    CURRENT THERAPY: Feraheme 1020 mg IV prn.  Currently we are checking CBC and ferritin every month (per her PCP's office).  INTERVAL HISTORY: [name] 80 y.o. female with a a history of anemia secondary to iron deficiency and gastrointestinal bleeding from colonic arteriovenous malformations is here for follow-up.  She was last seen by me on 02/16/2014.  Today, she is doing well overall. She does report R heel pain which started Sunday. She is accompanied by her niece [name].    She has had two hospitalizations.  She was admitted for  anemia on 04/27 and discharged on 04/29.  She was followed by Dr. Carlean Purl with a colonoscopy on 04/29 revealing a redundant colon with inability to access the cecum and severe diverticulosis. She had a second more hospitalization due to hospital acquired pnuemonia (from 05/01 till 05/07).     She continues to check her labs every one month.   On 02/02/2014, the patient came in for IV Feraheme 1020 mg. She tolerates her infusions well. She denies pagophagia or any hematochezia or melena. She remains asymptomatic for symptoms of anemia   MEDICAL HISTORY: Past Medical History  Diagnosis Date  . COPD (chronic obstructive pulmonary disease)   . Hypertrophic obstructive cardiomyopathy     Echo 10/10 EF 65-70% SW 1.6 PW 1.4 mild SAM no LVOT gradient at rest. Valsalva not performed Mild MR. No hyperenhance ment on MRI EF 69%.  . Coronary atherosclerosis of native coronary artery     Cath 1/11: RCA 50-60 ostial o/w normal. EF 70%.   . Pulmonary hypertension     Cath 1/11 RA 8, RV 43/6/14, PA 42/17 (29) PCWP 15 Fick  5.0/2.6. PVR 2.8    . Edema   . Occlusion and stenosis of carotid artery     s/p L CEA u/s 3/11. R 60-79%; L 40-59%. no change since 3/10   . Peripheral vascular disease, unspecified   . Hypertension   . Emphysema   . Asthma   . Hypothyroidism   . Allergic rhinitis   . Gastric AVM   . Heart murmur   . Anginal pain     "pressure"  . Emphysema   . Oxygen dependent 04/23/2012    "concentrated at night; liquid during day"  . Dyspnea 04/23/2012    "all the time"  . Anemia 04/23/2012    "severe @ times; think caused by colonic AVMs"  . History of blood transfusion     "many times"  . H/O hiatal hernia   . Stroke ~ 2004    denies residual on 04/23/2012  . Arthritis     "probably"  . Breast cancer   . Pneumonia     INTERIM HISTORY: has HYPOTHYROIDISM; HYPERTENSION; CAD, NATIVE VESSEL; PULMONARY HYPERTENSION, SECONDARY; Hypertrophic obstructive cardiomyopathy; CAROTID ARTERY DISEASE; C V A / STROKE; PVD; ALLERGIC RHINITIS; EMPHYSEMA; ASTHMA; COPD; EDEMA; DYSPNEA; ABNORMAL ECHOCARDIOGRAM; ABNORMAL EKG; BREAST CANCER, HX OF; HYPOTHYROIDISM; GERD (gastroesophageal reflux disease); Exertional angina; Chest pain on exertion; CAD (coronary artery disease); Microcytic anemia; Angiodysplasia of intestine with hemorrhage; Nonspecific abnormal finding in stool contents; GI bleed; Acute blood loss anemia; Fever; Sepsis; Acute renal failure; Normocytic anemia; Elevated brain natriuretic peptide (BNP) level; HCAP (healthcare-associated pneumonia); and Diastolic CHF, acute on chronic on her problem list.  ALLERGIES:  is allergic to latex.  MEDICATIONS: has a current medication list which includes the following prescription(s): albuterol, aspirin, atorvastatin, budesonide-formoterol, calcium carbonate, cetirizine, cholecalciferol, hydrocodone-acetaminophen, levothyroxine, one-a-day extras antioxidant, NON FORMULARY, omeprazole, ondansetron, polyethylene glycol, senna-docusate, tiotropium, tolterodine, and zolpidem.  SURGICAL HISTORY:  Past Surgical History  Procedure Laterality Date  . Carotid endarterectomy  2005    left  . Thyroidectomy   2007  . Appendectomy  1950  . Breast lumpectomy  2002    right-hx of radiation  . Dilation and curettage of uterus  1950's  . Cataract extraction w/ intraocular lens implant  02/2011    right  . Excisional hemorrhoidectomy  1950's  . Cardiac catheterization    . Colonoscopy N/A 01/12/2014    Procedure: COLONOSCOPY;  Surgeon: Victoria Mayer, MD;  Location: [facility];  Service: Endoscopy;  Laterality: N/A;  MAC if available   PROBLEM LIST:  1. Anemia secondary to iron deficiency and gastrointestinal bleeding from colonic arteriovenous malformations. The patient received IV INFeD 1025 mg on 04/24/2012 when she was hospitalized. She has also received Feraheme 1020 mg IV on 07/17/2012, 08/21/2012 and 12/07/2012.  2. Chronic obstructive pulmonary disease (COPD) diagnosed in 1999, currently on home oxygen at 3 L/minute at rest and during sleep. The patient uses 4 L per minute with exertion. She has been on home oxygen for approximately the past 5 years.  3. Pulmonary hypertension.  4. Right frontal arteriovenous malformation (AVM).  5. Coronary artery disease.  6. Diastolic dysfunction.  7. Past history of multiple strokes.  8. History of left carotid stenosis with left carotid endarterectomy  in 2006 at [facility].  9. Hypertension.  10. Dyslipidemia.  11. Ductal carcinoma in situ involving the right breast, status post  lumpectomy, radiation, and 5 years of tamoxifen.  12. Peripheral vascular disease.  13. Hiatal hernia.  14. Status post thyroidectomy in 2009 or 2010.  15. Osteoporosis.  16. Stroke involving right eye in April 2011, now with vision restored.  17. Diverticulosis.  18. Hearing impairment.  19. Systolic ejection murmur.  20. Hoarseness noted around December 2013. This was evaluated by an ENT specialist.   REVIEW OF SYSTEMS:   Constitutional: Denies fevers, chills or abnormal weight loss Eyes: Denies blurriness of vision Ears, nose, mouth, throat, and face: Denies  mucositis or sore throat Respiratory: Denies cough, dyspnea or wheezes Cardiovascular: Denies palpitation, chest discomfort or lower extremity swelling Gastrointestinal:  Denies nausea, heartburn or change in bowel habits Skin: Denies abnormal skin rashes Lymphatics: Denies new lymphadenopathy or easy bruising Neurological:Denies numbness, tingling or new weaknesses Behavioral/Psych: Mood is stable, no new changes  All other systems were reviewed with the patient and are negative.  PHYSICAL EXAMINATION: ECOG PERFORMANCE STATUS: 0 - Asymptomatic  Blood pressure 136/56, pulse 86, temperature 97.9 F (36.6 C), temperature source Oral, resp. rate 18, height 5\' 6"  (1.676 m), weight 162 lb 4.8 oz (73.619 kg).  GENERAL:alert, no distress and comfortable; elderly female who appears her stated age.  SKIN: skin color, texture, turgor are normal, no rashes or significant lesions EYES: normal, Conjunctiva are pink and non-injected, sclera clear OROPHARYNX:no exudate, no erythema and lips, buccal mucosa, and tongue normal  NECK: supple, thyroid normal size, non-tender, without nodularity LYMPH:  no palpable lymphadenopathy in the cervical, axillary or supraclavicular LUNGS: clear to auscultation and percussion with normal breathing effort HEART: regular rate & rhythm and no murmurs and no lower extremity edema ABDOMEN:abdomen soft, non-tender and normal bowel sounds Musculoskeletal:no cyanosis of  digits and no clubbing  NEURO: alert & oriented x 3 with fluent speech, no focal motor/sensory deficits  LABORATORY DATA: Results for orders placed in visit on 05/11/14 (from the past 48 hour(s))  CBC WITH DIFFERENTIAL     Status: Abnormal   Collection Time    05/11/14  9:58 AM      Result Value Ref Range   WBC 8.7  3.9 - 10.3 10e3/uL   NEUT# 4.8  1.5 - 6.5 10e3/uL   HGB 13.4  11.6 - 15.9 g/dL   HCT 41.6  34.8 - 46.6 %   Platelets 250  145 - 400 10e3/uL   MCV 91.9  79.5 - 101.0 fL   MCH 29.7  25.1  - 34.0 pg   MCHC 32.3  31.5 - 36.0 g/dL   RBC 4.52  3.70 - 5.45 10e6/uL   RDW 15.2 (*) 11.2 - 14.5 %   lymph# 2.6  0.9 - 3.3 10e3/uL   MONO# 0.9  0.1 - 0.9 10e3/uL   Eosinophils Absolute 0.3  0.0 - 0.5 10e3/uL   Basophils Absolute 0.1  0.0 - 0.1 10e3/uL   NEUT% 55.9  38.4 - 76.8 %   LYMPH% 29.8  14.0 - 49.7 %   MONO% 10.0  0.0 - 14.0 %   EOS% 3.0  0.0 - 7.0 %   BASO% 1.3  0.0 - 2.0 %  COMPREHENSIVE METABOLIC PANEL (YQ65)     Status: Abnormal   Collection Time    05/11/14  9:58 AM      Result Value Ref Range   Sodium 142  136 - 145 mEq/L   Potassium 4.3  3.5 - 5.1 mEq/L   Chloride 100  98 - 109 mEq/L   CO2 35 (*) 22 - 29 mEq/L   Glucose 100  70 - 140 mg/dl   BUN 20.7  7.0 - 26.0 mg/dL   Creatinine 0.8  0.6 - 1.1 mg/dL   Total Bilirubin 0.39  0.20 - 1.20 mg/dL   Alkaline Phosphatase 84  40 - 150 U/L   AST 18  5 - 34 U/L   ALT 13  0 - 55 U/L   Total Protein 7.4  6.4 - 8.3 g/dL   Albumin 4.1  3.5 - 5.0 g/dL   Calcium 9.5  8.4 - 10.4 mg/dL   Anion Gap 7  3 - 11 mEq/L  LACTATE DEHYDROGENASE (CC13)     Status: None   Collection Time    05/11/14  9:58 AM      Result Value Ref Range   LDH 170  125 - 245 U/L    Labs:  Lab Results  Component Value Date   WBC 8.7 05/11/2014   HGB 13.4 05/11/2014   HCT 41.6 05/11/2014   MCV 91.9 05/11/2014   PLT 250 05/11/2014   NEUTROABS 4.8 05/11/2014      Chemistry      Component Value Date/Time   NA 142 05/11/2014 0958   NA 139 01/20/2014 0347   NA 144 06/29/2012 1130   K 4.3 05/11/2014 0958   K 3.6* 01/20/2014 0347   CL 95* 01/20/2014 0347   CL 101 03/08/2013 1333   CO2 35* 05/11/2014 0958   CO2 35* 01/20/2014 0347   BUN 20.7 05/11/2014 0958   BUN 22 01/20/2014 0347   BUN 17 06/29/2012 1130   CREATININE 0.8 05/11/2014 0958   CREATININE 1.32* 01/20/2014 0347      Component Value Date/Time   CALCIUM 9.5 05/11/2014 0958  CALCIUM 9.3 01/20/2014 0347   ALKPHOS 84 05/11/2014 0958   ALKPHOS 79 01/14/2014 2320   AST 18 05/11/2014 0958   AST 15 01/14/2014  2320   ALT 13 05/11/2014 0958   ALT 10 01/14/2014 2320   BILITOT 0.39 05/11/2014 0958   BILITOT 0.3 01/14/2014 2320     Basic Metabolic Panel:  Recent Labs Lab 05/11/14 0958  NA 142  K 4.3  CO2 35*  GLUCOSE 100  BUN 20.7  CREATININE 0.8  CALCIUM 9.5   GFR Estimated Creatinine Clearance: 58.5 ml/min (by C-G formula based on Cr of 0.8). Liver Function Tests:  Recent Labs Lab 05/11/14 0958  AST 18  ALT 13  ALKPHOS 84  BILITOT 0.39  PROT 7.4  ALBUMIN 4.1   CBC:  Recent Labs Lab 05/11/14 0958  WBC 8.7  NEUTROABS 4.8  HGB 13.4  HCT 41.6  MCV 91.9  PLT 250   Anemia work up Results for [name], [name] (MRN [id number]) as of 05/11/2014 13:00  Ref. Range 05/11/2014 09:58  Iron Latest Range: 42-145 ug/dL 90  UIBC Latest Range: 125-400 ug/dL 227  TIBC Latest Range: 250-470 ug/dL 318  %SAT Latest Range: 20-55 % 28  Ferritin Latest Range: 10-291 ng/mL 142    RADIOGRAPHIC STUDIES: No results found.  ASSESSMENT: [name] 80 y.o. female with a history of Microcytic anemia   PLAN:  1. Anemia secondary to iron deficiency and GI bleeding.  --She continues to recover from her recent hospitalizations.   Her iron studies done at today showed a ferritin of 142.  Her hemoglobin is 13.6.  She remains asymptomatic for anemia. We will check her labs every monthly.  She was counseled should she experience any symptoms of anemia, to please call and we will arrange for complete blood count to be collected. She will have labs drawn at her PCP's office and faxed to [place] for review.   2. Follow-up. -- [name] will return in 3 months, at which time we will check CBC, chemistries, iron TIBC and ferritin.  All questions were answered. The patient knows to call the clinic with any problems, questions or concerns. We can certainly see the patient much sooner if necessary.  I spent 10 minutes counseling the patient face to face. The total time spent in the appointment was 15  minutes.    Elius Etheredge, MD 05/11/2014 11:14 AM

## 2014-05-11 NOTE — Patient Instructions (Signed)
Heel Spur A heel spur is a hook of bone that can form on the calcaneus (the heel bone and the largest bone of the foot). Heel spurs are often associated with plantar fasciitis and usually come in people who have had the problem for an extended period of time. The cause of the relationship is unknown. The pain associated with them is thought to be caused by an inflammation (soreness and redness) of the plantar fascia rather than the spur itself. The plantar fascia is a thick fibrous like tissue that runs from the calcaneus (heel bone) to the ball of the foot. This strong, tight tissue helps maintain the arch of your foot. It helps distribute the weight across your foot as you walk or run. Stresses placed on the plantar fascia can be tremendous. When it is inflamed normal activities become painful. Pain is worse in the morning after sleeping. After sleeping the plantar fascia is tight. The first movements stretch the fascia and this causes pain. As the tendon loosens, the pain usually gets better. It often returns with too much standing or walking.  About 70% of patients with plantar fasciitis have a heel spur. About half of people without foot pain also have heel spurs. DIAGNOSIS  The diagnosis of a heel spur is made by X-ray. The X-ray shows a hook of bone protruding from the bottom of the calcaneus at the point where the plantar fascia is attached to the heel bone.  TREATMENT  It is necessary to find out what is causing the stretching of the plantar fascia. If the cause is over-pronation (flat feet), orthotics and proper foot ware may help.  Stretching exercises, losing weight, wearing shoes that have a cushioned heel that absorbs shock, and elevating the heel with the use of a heel cradle, heel cup, or orthotics may all help. Heel cradles and heel cups provide extra comfort and cushion to the heel, and reduce the amount of shock to the sore area. AVOIDING THE PAIN OF PLANTAR FASCIITIS AND HEEL  SPURS  Consult a sports medicine professional before beginning a new exercise program.  Walking programs offer a good workout. There is a lower chance of overuse injuries common to the runners. There is less impact and less jarring of the joints.  Begin all new exercise programs slowly. If problems or pains develop, decrease the amount of time or distance until you are at a comfortable level.  Wear good shoes and replace them regularly.  Stretch your foot and the heel cords at the back of the ankle (Achilles tendons) both before and after exercise.  Run or exercise on even surfaces that are not hard. For example, asphalt is better than pavement.  Do not run barefoot on hard surfaces.  If using a treadmill, vary the incline.  Do not continue to workout if you have foot or joint problems. Seek professional help if they do not improve. HOME CARE INSTRUCTIONS   Avoid activities that cause you pain until you recover.  Use ice or cold packs to the problem or painful areas after working out.  Only take over-the-counter or prescription medicines for pain, discomfort, or fever as directed by your caregiver.  Soft shoe inserts or athletic shoes with air or gel sole cushions may be helpful.  If problems continue or become more severe, consult a sports medicine caregiver. Cortisone is a potent anti-inflammatory medication that may be injected into the painful area. You can discuss this treatment with your caregiver. MAKE SURE YOU:  Understand these instructions.  Will watch your condition.  Will get help right away if you are not doing well or get worse. Document Released: 10/09/2005 Document Revised: 11/25/2011 Document Reviewed: 11/03/2013 Exit[employer] Patient Information 2015 [employer]. This information is not intended to replace advice given to you by your health care provider. Make sure you discuss any questions you have with your health care provider.  

## 2014-05-17 ENCOUNTER — Ambulatory Visit: Payer: Self-pay | Admitting: Podiatry

## 2014-05-24 ENCOUNTER — Ambulatory Visit: Payer: Self-pay

## 2014-07-06 ENCOUNTER — Other Ambulatory Visit: Payer: Self-pay | Admitting: Emergency Medicine

## 2014-07-20 ENCOUNTER — Ambulatory Visit (INDEPENDENT_AMBULATORY_CARE_PROVIDER_SITE_OTHER): Payer: Medicare Other | Admitting: Emergency Medicine

## 2014-07-20 ENCOUNTER — Encounter: Payer: Self-pay | Admitting: Emergency Medicine

## 2014-07-20 VITALS — BP 118/86 | HR 97 | Temp 97.1°F | Ht 64.0 in | Wt 170.2 lb

## 2014-07-20 DIAGNOSIS — I2510 Atherosclerotic heart disease of native coronary artery without angina pectoris: Secondary | ICD-10-CM

## 2014-07-20 DIAGNOSIS — J449 Chronic obstructive pulmonary disease, unspecified: Secondary | ICD-10-CM

## 2014-07-20 NOTE — Assessment & Plan Note (Signed)
she has been stable and doing well on current regimen. Her insurance wants to change to Incruse from Spiriva in 2016. Will follow with her in January to discuss. I asked her to wear her o2 more reliably with exertion.

## 2014-07-20 NOTE — Patient Instructions (Signed)
Please continue your spiriva and symbicort for now.  We will follow in early January to discuss changing Spiriva to Incruse given your insurance formulary. If you do not do well on the Incruse then we will change back to Spiriva Continue your other medications as you have been taking them You may benefit from wearing your oxygen with ALL exertion.

## 2014-07-20 NOTE — Progress Notes (Signed)
  Subjective:    Patient ID: [name], female    DOB: Mar 26, 1933, 80 y.o.   MRN: [id number] HPI 14 former smoker, dx with COPD about 1999, also hx breast CA, CVA with R frontal AVM, colonic AVM's. Also diastolic dysfxn followed Dr Haroldine Laws. Was treated with Spiriva + Advair, since 2003. Now on Symbicort  ROV 05/14/13 -- Severe COPD, HTN + diastolic dysfxn, hypoxemia, symptomatic anemia. Last time we increased her symbicort to bid. Her breathing is doing well. She is able to exert some, but limited. She is not wheezing.   ROV 10/08/13 -- Severe COPD, HTN + diastolic dysfxn, hypoxemia, symptomatic anemia. She follows with Dr Juliann Mule with H/O for her Fe-def and anemia. She is breathing fairly well, did have some difficulty today with walking into the office. She is on Symbicort, Spiriva. Off prednisone. Uses albuterol a couple times a week.   ROV 03/25/14 -- Severe COPD, HTN + diastolic dysfxn, hypoxemia, anemia.  She was unfortunately admitted for PNA after she was seen in ED for GIB. She went to rehab x 14 days and now back home. Her breathing is back to baseline. She is using O2 for goal SpO2 > 90%.  On symbicort + spiriva, uses albuterol once a week.   ROV 07/20/14 -- follow up visit for Severe COPD, HTN + diastolic dysfxn, hypoxemia, anemia. She has been doing well since last visit. She has been wearing O2 at night and in the day prn. She is on spiriva and symbicort, but has received info that says her formulary is changing Spiriva to Incruse. She needs new script for all of her inhalers. No exacerbations since last time. Uses SABA about once every day.   Objective:  Physical Exam Filed Vitals:   07/20/14 1350  BP: 118/86  Pulse: 97  Temp: 97.1 F (36.2 C)  TempSrc: Oral  Height: 5\' 4"  (1.626 m)  Weight: 170 lb 3.2 oz (77.202 kg)  SpO2: 97%   GEN: A/Ox3; pleasant , NAD, elderly female   HEENT:  [place]/AT,  EACs-clear, TMs-wnl, NOSE-clear, THROAT-clear, no lesions, no postnasal drip or exudate  noted.   NECK:  Supple w/ fair ROM; no JVD; normal carotid impulses w/o bruits; no thyromegaly or nodules palpated; no lymphadenopathy.  RESP  Few soft basilar insp crackles B, B exp wheezes L > R  CARD:  RRR, Gr 3/6 holosystolic blowing M without an S2, loudest at R sternal border  Musco: Warm bil, no deformities or joint swelling noted.   Neuro: alert, no focal deficits noted.    Skin: Warm, no lesions or rashes   Assessment & Plan:   COPD (chronic obstructive pulmonary disease) she has been stable and doing well on current regimen. Her insurance wants to change to Incruse from Spiriva in 2016. Will follow with her in January to discuss. I asked her to wear her o2 more reliably with exertion.

## 2014-07-28 ENCOUNTER — Telehealth: Payer: Self-pay

## 2014-07-28 NOTE — Telephone Encounter (Signed)
Melissa Amash called to schedule feraheme appt for her aunt. S/w Dr Lona Kettle and OV will be changed to coincide with feraheme infusion. POF to scheduler. Tried to call Melissa back with inability to leave message on cell phone. LVM on home phone.

## 2014-07-29 ENCOUNTER — Telehealth: Payer: Self-pay | Admitting: Hematology

## 2014-07-29 ENCOUNTER — Telehealth: Payer: Self-pay | Admitting: *Deleted

## 2014-07-29 NOTE — Telephone Encounter (Signed)
Per staff message and POF I have scheduled appts. Advised scheduler of appts. JMW  

## 2014-07-29 NOTE — Telephone Encounter (Signed)
,,

## 2014-08-01 ENCOUNTER — Telehealth: Payer: Self-pay

## 2014-08-01 ENCOUNTER — Telehealth: Payer: Self-pay | Admitting: *Deleted

## 2014-08-01 NOTE — Telephone Encounter (Signed)
Per staff message and POF I have scheduled appts. Advised scheduler of appts. JMW  

## 2014-08-01 NOTE — Telephone Encounter (Signed)
lvm for melissa to call and talk with scheduler about consolidating appts. In basket sent to [facility] [place] in scheduling about the same.

## 2014-08-03 ENCOUNTER — Telehealth: Payer: Self-pay | Admitting: Hematology

## 2014-08-03 ENCOUNTER — Ambulatory Visit: Payer: Medicare Other

## 2014-08-03 ENCOUNTER — Other Ambulatory Visit: Payer: Medicare Other

## 2014-08-03 NOTE — Telephone Encounter (Signed)
Pt daughter called and want to keep appt for Monday. Pt daughter confirmed appt.

## 2014-08-05 ENCOUNTER — Other Ambulatory Visit: Payer: Self-pay | Admitting: *Deleted

## 2014-08-05 ENCOUNTER — Other Ambulatory Visit: Payer: Medicare Other

## 2014-08-05 ENCOUNTER — Ambulatory Visit: Payer: Medicare Other

## 2014-08-08 ENCOUNTER — Telehealth: Payer: Self-pay | Admitting: Hematology

## 2014-08-08 ENCOUNTER — Ambulatory Visit (HOSPITAL_BASED_OUTPATIENT_CLINIC_OR_DEPARTMENT_OTHER): Payer: Medicare Other | Admitting: Hematology

## 2014-08-08 ENCOUNTER — Ambulatory Visit (HOSPITAL_BASED_OUTPATIENT_CLINIC_OR_DEPARTMENT_OTHER): Payer: Medicare Other

## 2014-08-08 ENCOUNTER — Telehealth: Payer: Self-pay | Admitting: *Deleted

## 2014-08-08 ENCOUNTER — Other Ambulatory Visit: Payer: Medicare Other

## 2014-08-08 ENCOUNTER — Encounter: Payer: Self-pay | Admitting: Hematology

## 2014-08-08 ENCOUNTER — Other Ambulatory Visit: Payer: Self-pay

## 2014-08-08 ENCOUNTER — Ambulatory Visit: Payer: Medicare Other

## 2014-08-08 ENCOUNTER — Other Ambulatory Visit (HOSPITAL_BASED_OUTPATIENT_CLINIC_OR_DEPARTMENT_OTHER): Payer: Medicare Other

## 2014-08-08 VITALS — BP 138/59 | HR 94 | Temp 97.8°F | Resp 18 | Ht 64.0 in | Wt 166.8 lb

## 2014-08-08 DIAGNOSIS — D509 Iron deficiency anemia, unspecified: Secondary | ICD-10-CM

## 2014-08-08 DIAGNOSIS — Q2733 Arteriovenous malformation of digestive system vessel: Secondary | ICD-10-CM

## 2014-08-08 DIAGNOSIS — K5520 Angiodysplasia of colon without hemorrhage: Secondary | ICD-10-CM

## 2014-08-08 DIAGNOSIS — K5521 Angiodysplasia of colon with hemorrhage: Secondary | ICD-10-CM

## 2014-08-08 LAB — CBC WITH DIFFERENTIAL/PLATELET
BASO%: 1 % (ref 0.0–2.0)
Basophils Absolute: 0.1 10*3/uL (ref 0.0–0.1)
EOS%: 2.9 % (ref 0.0–7.0)
Eosinophils Absolute: 0.3 10*3/uL (ref 0.0–0.5)
HEMATOCRIT: 40.6 % (ref 34.8–46.6)
HGB: 13 g/dL (ref 11.6–15.9)
LYMPH#: 1.9 10*3/uL (ref 0.9–3.3)
LYMPH%: 21.1 % (ref 14.0–49.7)
MCH: 30.2 pg (ref 25.1–34.0)
MCHC: 32.1 g/dL (ref 31.5–36.0)
MCV: 94.3 fL (ref 79.5–101.0)
MONO#: 0.8 10*3/uL (ref 0.1–0.9)
MONO%: 8.4 % (ref 0.0–14.0)
NEUT#: 6 10*3/uL (ref 1.5–6.5)
NEUT%: 66.6 % (ref 38.4–76.8)
Platelets: 239 10*3/uL (ref 145–400)
RBC: 4.31 10*6/uL (ref 3.70–5.45)
RDW: 13.2 % (ref 11.2–14.5)
WBC: 9 10*3/uL (ref 3.9–10.3)

## 2014-08-08 LAB — IRON AND TIBC CHCC
%SAT: 30 % (ref 21–57)
Iron: 103 ug/dL (ref 41–142)
TIBC: 341 ug/dL (ref 236–444)
UIBC: 238 ug/dL (ref 120–384)

## 2014-08-08 LAB — COMPREHENSIVE METABOLIC PANEL (CC13)
ALK PHOS: 88 U/L (ref 40–150)
ALT: 13 U/L (ref 0–55)
AST: 19 U/L (ref 5–34)
Albumin: 3.9 g/dL (ref 3.5–5.0)
Anion Gap: 10 mEq/L (ref 3–11)
BUN: 21.2 mg/dL (ref 7.0–26.0)
CALCIUM: 9.4 mg/dL (ref 8.4–10.4)
CO2: 33 mEq/L — ABNORMAL HIGH (ref 22–29)
CREATININE: 0.8 mg/dL (ref 0.6–1.1)
Chloride: 100 mEq/L (ref 98–109)
Glucose: 128 mg/dl (ref 70–140)
Potassium: 4 mEq/L (ref 3.5–5.1)
Sodium: 142 mEq/L (ref 136–145)
Total Bilirubin: 0.51 mg/dL (ref 0.20–1.20)
Total Protein: 6.8 g/dL (ref 6.4–8.3)

## 2014-08-08 LAB — FERRITIN CHCC: FERRITIN: 28 ng/mL (ref 9–269)

## 2014-08-08 MED ORDER — SODIUM CHLORIDE 0.9 % IV SOLN
INTRAVENOUS | Status: DC
  Administered 2014-08-08: 13:00:00 via INTRAVENOUS

## 2014-08-08 MED ORDER — FERUMOXYTOL INJECTION 510 MG/17 ML
1020.0000 mg | Freq: Once | INTRAVENOUS | Status: AC
  Administered 2014-08-08: 1020 mg via INTRAVENOUS
  Filled 2014-08-08: qty 34

## 2014-08-08 NOTE — Telephone Encounter (Signed)
gv adn prnted appt sched and avs for pt for jan 2016

## 2014-08-08 NOTE — Telephone Encounter (Signed)
Patietn moved appt from 1/25 to 1/26

## 2014-08-08 NOTE — Telephone Encounter (Signed)
Per patient request I have moved apapts from 1/18 to 1/25

## 2014-08-08 NOTE — Progress Notes (Signed)
[facility] PROGRESS NOTE DATE OF SERVICE: 08/08/2014  Victoria Nakayama, MD [address] [place] [address]  DIAGNOSIS: Iron deficiency anemia - Plan: CBC with Differential, Ferritin, Iron and TIBC [facility]  AVM (arteriovenous malformation) of colon  Chief Complaint  Patient presents with  . Follow-up    CURRENT THERAPY: Feraheme 1020 mg IV prn.  Currently we are checking CBC and ferritin every month (per her PCP's office). Patient to get Feraheme today on 08/08/2014 and we will arrange a follow up in 2 months.  INTERVAL HISTORY:  [name] 80 y.o. female with a a history of anemia secondary to iron deficiency and gastrointestinal bleeding from colonic arteriovenous malformations is here for follow-up.  She was last seen by Dr Juliann Mule on 05/11/2014. Today, she is doing well overall.  She is accompanied by her niece [place].    She has had two hospitalizations.  She was admitted for  anemia on 04/27 and discharged on 04/29.  She was followed by Dr. Carlean Purl with a colonoscopy on 04/29 revealing a redundant colon with inability to access the cecum and severe diverticulosis. She had a second more hospitalization due to hospital acquired pnuemonia (from 05/01 till 05/07).     She continues to have her labs every one month.   On 02/02/2014, the patient came in for IV Feraheme 1020 mg and also had one on 04/01/2014 1020 mg. She tolerates her infusions well. She denies pagophagia or any hematochezia or melena. She remains asymptomatic for symptoms of anemia. Recently she had a ferritin done on 07/14/14 which was reported at 51. Her iron studies are pending from today. Other labs are reported below.             MEDICAL HISTORY: Past Medical History  Diagnosis Date  . COPD (chronic obstructive pulmonary disease)   . Hypertrophic obstructive cardiomyopathy     Echo 10/10 EF 65-70% SW 1.6 PW 1.4 mild SAM no LVOT gradient at rest. Valsalva not  performed Mild MR. No hyperenhance ment on MRI EF 69%.  . Coronary atherosclerosis of native coronary artery     Cath 1/11: RCA 50-60 ostial o/w normal. EF 70%.   . Pulmonary hypertension     Cath 1/11 RA 8, RV 43/6/14, PA 42/17 (29) PCWP 15 Fick  5.0/2.6. PVR 2.8    . Edema   . Occlusion and stenosis of carotid artery     s/p L CEA u/s 3/11. R 60-79%; L 40-59%. no change since 3/10  . Peripheral vascular disease, unspecified   . Hypertension   . Emphysema   . Asthma   . Hypothyroidism   . Allergic rhinitis   . Gastric AVM   . Heart murmur   . Anginal pain     "pressure"  . Emphysema   . Oxygen dependent 04/23/2012    "concentrated at night; liquid during day"  . Dyspnea 04/23/2012    "all the time"  . Anemia 04/23/2012    "severe @ times; think caused by colonic AVMs"  . History of blood transfusion     "many times"  . H/O hiatal hernia   . Stroke ~ 2004    denies residual on 04/23/2012  . Arthritis     "probably"  . Breast cancer   . Pneumonia     INTERIM HISTORY: has HYPOTHYROIDISM; HYPERTENSION; CAD, NATIVE VESSEL; PULMONARY HYPERTENSION, SECONDARY; Hypertrophic obstructive cardiomyopathy; CAROTID ARTERY DISEASE; C V A / STROKE; PVD; ALLERGIC RHINITIS; EMPHYSEMA; ASTHMA;  COPD (chronic obstructive pulmonary disease); EDEMA; DYSPNEA; ABNORMAL ECHOCARDIOGRAM; ABNORMAL EKG; BREAST CANCER, HX OF; HYPOTHYROIDISM; GERD (gastroesophageal reflux disease); Exertional angina; Chest pain on exertion; CAD (coronary artery disease); Microcytic anemia; Angiodysplasia of intestine with hemorrhage; Nonspecific abnormal finding in stool contents; GI bleed; Acute blood loss anemia; Fever; Sepsis; Acute renal failure; Normocytic anemia; Elevated brain natriuretic peptide (BNP) level; HCAP (healthcare-associated pneumonia); and Diastolic CHF, acute on chronic on her problem list.    ALLERGIES:  is allergic to latex.  MEDICATIONS: has a current medication list which includes the following  prescription(s): albuterol, aspirin, atorvastatin, budesonide-formoterol, calcium carbonate, cetirizine, cholecalciferol, hydrocodone-acetaminophen, levothyroxine, one-a-day extras antioxidant, NON FORMULARY, omeprazole, ondansetron, polyethylene glycol, senna-docusate, spiriva handihaler, tiotropium, tolterodine, and zolpidem.  SURGICAL HISTORY:  Past Surgical History  Procedure Laterality Date  . Carotid endarterectomy  2005    left  . Thyroidectomy  2007  . Appendectomy  1950  . Breast lumpectomy  2002    right-hx of radiation  . Dilation and curettage of uterus  1950's  . Cataract extraction w/ intraocular lens implant  02/2011    right  . Excisional hemorrhoidectomy  1950's  . Cardiac catheterization    . Colonoscopy N/A 01/12/2014    Procedure: COLONOSCOPY;  Surgeon: Gatha Mayer, MD;  Location: [facility];  Service: Endoscopy;  Laterality: N/A;  MAC if available   PROBLEM LIST:  1. Anemia secondary to iron deficiency and gastrointestinal bleeding from colonic arteriovenous malformations. The patient received IV INFeD 1025 mg on 04/24/2012 when she was hospitalized. She has also received Feraheme 1020 mg IV on 07/17/2012, 08/21/2012 and 12/07/2012.  2. Chronic obstructive pulmonary disease (COPD) diagnosed in 1999, currently on home oxygen at 3 L/minute at rest and during sleep. The patient uses 4 L per minute with exertion. She has been on home oxygen for approximately the past 5 years.  3. Pulmonary hypertension.  4. Right frontal arteriovenous malformation (AVM).  5. Coronary artery disease.  6. Diastolic dysfunction.  7. Past history of multiple strokes.  8. History of left carotid stenosis with left carotid endarterectomy  in 2006 at [facility].  9. Hypertension.  10. Dyslipidemia.  11. Ductal carcinoma in situ involving the right breast, status post  lumpectomy, radiation, and 5 years of tamoxifen.  12. Peripheral vascular disease.  13. Hiatal hernia.  14. Status  post thyroidectomy in 2009 or 2010.  15. Osteoporosis.  16. Stroke involving right eye in April 2011, now with vision restored.  17. Diverticulosis.  18. Hearing impairment.  19. Systolic ejection murmur.  20. Hoarseness noted around December 2013. This was evaluated by an ENT specialist.   REVIEW OF SYSTEMS:   Constitutional: Denies fevers, chills or abnormal weight loss Eyes: Denies blurriness of vision Ears, nose, mouth, throat, and face: Denies mucositis or sore throat Respiratory: Denies cough, dyspnea or wheezes Cardiovascular: Denies palpitation, chest discomfort or lower extremity swelling Gastrointestinal:  Denies nausea, heartburn or change in bowel habits Skin: Denies abnormal skin rashes Lymphatics: Denies new lymphadenopathy or easy bruising Neurological:Denies numbness, tingling or new weaknesses Behavioral/Psych: Mood is stable, no new changes  All other systems were reviewed with the patient and are negative.  PHYSICAL EXAMINATION: ECOG PERFORMANCE STATUS: 0-1  Blood pressure 138/59, pulse 94, temperature 97.8 F (36.6 C), temperature source Oral, resp. rate 18, height 5\' 4"  (1.626 m), weight 166 lb 12.8 oz (75.66 kg).  GENERAL:alert, no distress and comfortable; elderly female who appears her stated age.  SKIN: skin color, texture, turgor are normal,  no rashes or significant lesions EYES: normal, Conjunctiva are pink and non-injected, sclera clear OROPHARYNX:no exudate, no erythema and lips, buccal mucosa, and tongue normal  NECK: supple, thyroid normal size, non-tender, without nodularity LYMPH:  no palpable lymphadenopathy in the cervical, axillary or supraclavicular LUNGS: clear to auscultation and percussion with normal breathing effort HEART: regular rate & rhythm and no murmurs and no lower extremity edema ABDOMEN:abdomen soft, non-tender and normal bowel sounds Musculoskeletal:no cyanosis of digits and no clubbing  NEURO: alert & oriented x 3 with fluent  speech, no focal motor/sensory deficits  LABORATORY DATA: Results for orders placed or performed in visit on 08/08/14 (from the past 48 hour(s))  CBC with Differential     Status: None   Collection Time: 08/08/14 11:23 AM  Result Value Ref Range   WBC 9.0 3.9 - 10.3 10e3/uL   NEUT# 6.0 1.5 - 6.5 10e3/uL   HGB 13.0 11.6 - 15.9 g/dL   HCT 40.6 34.8 - 46.6 %   Platelets 239 145 - 400 10e3/uL   MCV 94.3 79.5 - 101.0 fL   MCH 30.2 25.1 - 34.0 pg   MCHC 32.1 31.5 - 36.0 g/dL   RBC 4.31 3.70 - 5.45 10e6/uL   RDW 13.2 11.2 - 14.5 %   lymph# 1.9 0.9 - 3.3 10e3/uL   MONO# 0.8 0.1 - 0.9 10e3/uL   Eosinophils Absolute 0.3 0.0 - 0.5 10e3/uL   Basophils Absolute 0.1 0.0 - 0.1 10e3/uL   NEUT% 66.6 38.4 - 76.8 %   LYMPH% 21.1 14.0 - 49.7 %   MONO% 8.4 0.0 - 14.0 %   EOS% 2.9 0.0 - 7.0 %   BASO% 1.0 0.0 - 2.0 %  Comprehensive metabolic panel (Cmet) - [facility]     Status: Abnormal   Collection Time: 08/08/14 11:23 AM  Result Value Ref Range   Sodium 142 136 - 145 mEq/L   Potassium 4.0 3.5 - 5.1 mEq/L   Chloride 100 98 - 109 mEq/L   CO2 33 (H) 22 - 29 mEq/L   Glucose 128 70 - 140 mg/dl   BUN 21.2 7.0 - 26.0 mg/dL   Creatinine 0.8 0.6 - 1.1 mg/dL   Total Bilirubin 0.51 0.20 - 1.20 mg/dL   Alkaline Phosphatase 88 40 - 150 U/L   AST 19 5 - 34 U/L   ALT 13 0 - 55 U/L   Total Protein 6.8 6.4 - 8.3 g/dL   Albumin 3.9 3.5 - 5.0 g/dL   Calcium 9.4 8.4 - 10.4 mg/dL   Anion Gap 10 3 - 11 mEq/L    Labs:  Lab Results  Component Value Date   WBC 9.0 08/08/2014   HGB 13.0 08/08/2014   HCT 40.6 08/08/2014   MCV 94.3 08/08/2014   PLT 239 08/08/2014   NEUTROABS 6.0 08/08/2014      Chemistry      Component Value Date/Time   NA 142 08/08/2014 1123   NA 139 01/20/2014 0347   NA 144 06/29/2012 1130   K 4.0 08/08/2014 1123   K 3.6* 01/20/2014 0347   CL 95* 01/20/2014 0347   CL 101 03/08/2013 1333   CO2 33* 08/08/2014 1123   CO2 35* 01/20/2014 0347   BUN 21.2 08/08/2014 1123   BUN 22  01/20/2014 0347   BUN 17 06/29/2012 1130   CREATININE 0.8 08/08/2014 1123   CREATININE 1.32* 01/20/2014 0347      Component Value Date/Time   CALCIUM 9.4 08/08/2014 1123   CALCIUM 9.3 01/20/2014  0347   ALKPHOS 88 08/08/2014 1123   ALKPHOS 79 01/14/2014 2320   AST 19 08/08/2014 1123   AST 15 01/14/2014 2320   ALT 13 08/08/2014 1123   ALT 10 01/14/2014 2320   BILITOT 0.51 08/08/2014 1123   BILITOT 0.3 01/14/2014 2320     Basic Metabolic Panel:  Recent Labs Lab 08/08/14 1123  NA 142  K 4.0  CO2 33*  GLUCOSE 128  BUN 21.2  CREATININE 0.8  CALCIUM 9.4   GFR Estimated Creatinine Clearance: 56.8 mL/min (by C-G formula based on Cr of 0.8). Liver Function Tests:  Recent Labs Lab 08/08/14 1123  AST 19  ALT 13  ALKPHOS 88  BILITOT 0.51  PROT 6.8  ALBUMIN 3.9   CBC:  Recent Labs Lab 08/08/14 1123  WBC 9.0  NEUTROABS 6.0  HGB 13.0  HCT 40.6  MCV 94.3  PLT 239   Anemia work up Results for TANECIA, MCCAY ANN (MRN [id number]) as of 05/11/2014 13:00  Ref. Range 05/11/2014 09:58  Iron Latest Range: 42-145 ug/dL 90  UIBC Latest Range: 125-400 ug/dL 227  TIBC Latest Range: 250-470 ug/dL 318  %SAT Latest Range: 20-55 % 28  Ferritin Latest Range: 10-291 ng/mL 142    RADIOGRAPHIC STUDIES: No results found.  ASSESSMENT: [name] 80 y.o. female with a history of Iron deficiency anemia - Plan: CBC with Differential, Ferritin, Iron and TIBC [facility]  AVM (arteriovenous malformation) of colon   PLAN:  1. Anemia secondary to iron deficiency and GI bleeding.  --Her iron studies done today are pending but on 10/29 she had a low ferritin at 51. She will get a Feraheme infusion today. I will recheck her CBC and iron stores in 2 months. --Told her to continue the Vitamin C 500 mg and a Zinc tablet 50 mg daily both of which help in IDA.  2. Follow-up. -- Ms. Sowell will return in 2 months with Dr Burr Medico.  All questions were answered. The patient knows to call the clinic  with any problems, questions or concerns. We can certainly see the patient much sooner if necessary.  I spent 10 minutes counseling the patient face to face. The total time spent in the appointment was 15 minutes.    Bernadene Bell, MD Medical Hematologist/Oncologist [place] Pager: [phone] Office No: [phone]

## 2014-08-08 NOTE — Patient Instructions (Signed)

## 2014-08-15 ENCOUNTER — Ambulatory Visit: Payer: Medicare Other

## 2014-08-15 ENCOUNTER — Other Ambulatory Visit: Payer: Medicare Other

## 2014-09-06 ENCOUNTER — Other Ambulatory Visit: Payer: Self-pay | Admitting: Emergency Medicine

## 2014-09-13 ENCOUNTER — Ambulatory Visit: Payer: Self-pay | Admitting: Internal Medicine

## 2014-09-20 ENCOUNTER — Ambulatory Visit (INDEPENDENT_AMBULATORY_CARE_PROVIDER_SITE_OTHER): Payer: Medicare Other | Admitting: Emergency Medicine

## 2014-09-20 ENCOUNTER — Encounter: Payer: Self-pay | Admitting: Emergency Medicine

## 2014-09-20 VITALS — BP 110/60 | HR 80 | Temp 97.9°F | Ht 64.0 in | Wt 166.2 lb

## 2014-09-20 DIAGNOSIS — J449 Chronic obstructive pulmonary disease, unspecified: Secondary | ICD-10-CM

## 2014-09-20 NOTE — Progress Notes (Signed)
  Subjective:    Patient ID: [name], female    DOB: 1932/11/11, 81 y.o.   MRN: [id number] HPI 80 former smoker, dx with COPD about 1999, also hx breast CA, CVA with R frontal AVM, colonic AVM's. Also diastolic dysfxn followed Dr Haroldine Laws. Was treated with Spiriva + Advair, since 2003. Now on Symbicort  ROV 05/14/13 -- Severe COPD, HTN + diastolic dysfxn, hypoxemia, symptomatic anemia. Last time we increased her symbicort to bid. Her breathing is doing well. She is able to exert some, but limited. She is not wheezing.   ROV 10/08/13 -- Severe COPD, HTN + diastolic dysfxn, hypoxemia, symptomatic anemia. She follows with Dr Juliann Mule with H/O for her Fe-def and anemia. She is breathing fairly well, did have some difficulty today with walking into the office. She is on Symbicort, Spiriva. Off prednisone. Uses albuterol a couple times a week.   ROV 03/25/14 -- Severe COPD, HTN + diastolic dysfxn, hypoxemia, anemia.  She was unfortunately admitted for PNA after she was seen in ED for GIB. She went to rehab x 14 days and now back home. Her breathing is back to baseline. She is using O2 for goal SpO2 > 90%.  On symbicort + spiriva, uses albuterol once a week.   ROV 07/20/14 -- follow up visit for Severe COPD, HTN + diastolic dysfxn, hypoxemia, anemia. She has been doing well since last visit. She has been wearing O2 at night and in the day prn. She is on spiriva and symbicort, but has received info that says her formulary is changing Spiriva to Incruse. She needs new script for all of her inhalers. No exacerbations since last time. Uses SABA about once every day.   ROV 09/20/14 -- f/u for severe COPD. She has been doing well - no flares, no pred, no abx. Has not required SABA very often.   Objective:  Physical Exam Filed Vitals:   09/20/14 1345 09/20/14 1348  BP:  110/60  Pulse:  80  Temp:  97.9 F (36.6 C)  TempSrc:  Oral  Height: 5\' 4"  (1.626 m)   Weight: 166 lb 3.2 oz (75.388 kg)   SpO2:  93%    GEN: A/Ox3; pleasant , NAD, elderly female   HEENT:  [place]arch[place]/AT,  EACs-clear, TMs-wnl, NOSE-clear, THROAT-clear, no lesions, no postnasal drip or exudate noted.   NECK:  Supple w/ fair ROM; no JVD; normal carotid impulses w/o bruits; no thyromegaly or nodules palpated; no lymphadenopathy.  RESP  Few soft basilar insp crackles B, B exp wheezes L > R  CARD:  RRR, Gr 3/6 holosystolic blowing M without an S2, loudest at R sternal border  Musco: Warm bil, no deformities or joint swelling noted.   Neuro: alert, no focal deficits noted.    Skin: Warm, no lesions or rashes   Assessment & Plan:   COPD (chronic obstructive pulmonary disease) Stop both your Symbicort and Spiriva for now Please start Anoro one inhalation daily. If you do well on the new medication and we will order this from her pharmacy. If your pharmacy does not cover Anoro  then we will instead go back to Symbicort and change Spiriva to Incruse.  Follow with Dr Lamonte Sakai in 3 months or sooner if you have any problems.

## 2014-09-20 NOTE — Patient Instructions (Signed)
Stop both your Symbicort and Spiriva for now Please start Anoro one inhalation daily. If you do well on the new medication and we will order this from her pharmacy. If your pharmacy does not cover Anoro  then we will instead go back to Symbicort and change Spiriva to Incruse.  Follow with Dr Lamonte Sakai in 3 months or sooner if you have any problems.

## 2014-09-20 NOTE — Assessment & Plan Note (Signed)
Stop both your Symbicort and Spiriva for now Please start Anoro one inhalation daily. If you do well on the new medication and we will order this from her pharmacy. If your pharmacy does not cover Anoro  then we will instead go back to Symbicort and change Spiriva to Incruse.  Follow with Dr Lamonte Sakai in 3 months or sooner if you have any problems.

## 2014-09-28 ENCOUNTER — Telehealth: Payer: Self-pay | Admitting: Emergency Medicine

## 2014-09-28 MED ORDER — BUDESONIDE-FORMOTEROL FUMARATE 160-4.5 MCG/ACT IN AERO: 2.0000 | INHALATION_SPRAY | Freq: Two times a day (BID) | RESPIRATORY_TRACT | Status: DC

## 2014-09-28 MED ORDER — UMECLIDINIUM BROMIDE 62.5 MCG/INH IN AEPB: 1.0000 | INHALATION_SPRAY | Freq: Every day | RESPIRATORY_TRACT | Status: DC

## 2014-09-28 NOTE — Telephone Encounter (Signed)
Per 09/20/14 OV: Patient Instructions       Stop both your Symbicort and Spiriva for now Please start Anoro one inhalation daily. If you do well on the new medication and we will order this from her pharmacy. If your pharmacy does not cover Anoro  then we will instead go back to Symbicort and change Spiriva to Incruse.  Follow with Dr Lamonte Sakai in 3 months or sooner if you have any problems.  --  Called spoke with pt. She reports the anoro broke her out in a rash and very itchy all over body. She wants incruse called in for her. She needs symbicort called in and incruse sent in. [facility] [place]. Will make RB aware this sent in.

## 2014-10-03 ENCOUNTER — Other Ambulatory Visit: Payer: Medicare Other

## 2014-10-03 ENCOUNTER — Ambulatory Visit: Payer: Medicare Other | Admitting: Hematology

## 2014-10-10 ENCOUNTER — Telehealth: Payer: Self-pay | Admitting: Hematology

## 2014-10-10 ENCOUNTER — Ambulatory Visit: Payer: Medicare Other | Admitting: Hematology

## 2014-10-10 ENCOUNTER — Other Ambulatory Visit: Payer: Medicare Other

## 2014-10-10 NOTE — Telephone Encounter (Signed)
Called and rescheduled  from 1/26 to 2/12. Confirmed appointment.

## 2014-10-10 NOTE — Telephone Encounter (Signed)
lvm for pt dtr and confirmed appt....pt ok and aware

## 2014-10-11 ENCOUNTER — Other Ambulatory Visit: Payer: Medicare Other

## 2014-10-11 ENCOUNTER — Ambulatory Visit: Payer: Medicare Other | Admitting: Hematology

## 2014-10-24 ENCOUNTER — Telehealth: Payer: Self-pay | Admitting: Hematology

## 2014-10-24 NOTE — Telephone Encounter (Signed)
Return call needing to reschedule appointment from 02/12 to 02/29. Mailed calendar.

## 2014-10-28 ENCOUNTER — Other Ambulatory Visit: Payer: Medicare Other

## 2014-10-28 ENCOUNTER — Ambulatory Visit: Payer: Medicare Other | Admitting: Hematology

## 2014-11-11 ENCOUNTER — Other Ambulatory Visit: Payer: Self-pay | Admitting: *Deleted

## 2014-11-11 DIAGNOSIS — D649 Anemia, unspecified: Secondary | ICD-10-CM

## 2014-11-14 ENCOUNTER — Telehealth: Payer: Self-pay | Admitting: Hematology

## 2014-11-14 ENCOUNTER — Other Ambulatory Visit: Payer: Medicare Other

## 2014-11-14 ENCOUNTER — Ambulatory Visit: Payer: Medicare Other | Admitting: Hematology

## 2014-11-14 NOTE — Telephone Encounter (Signed)
Ret call to r/s appt again

## 2014-11-16 ENCOUNTER — Ambulatory Visit: Payer: Medicare Other | Admitting: Hematology

## 2014-11-16 ENCOUNTER — Other Ambulatory Visit: Payer: Medicare Other

## 2014-11-24 ENCOUNTER — Ambulatory Visit (INDEPENDENT_AMBULATORY_CARE_PROVIDER_SITE_OTHER)
Admission: RE | Admit: 2014-11-24 | Discharge: 2014-11-24 | Disposition: A | Discharge status: Home / Self Care | Payer: Medicare Other | Source: Ambulatory Visit | Attending: Pulmonary Disease | Admitting: Pulmonary Disease

## 2014-11-24 ENCOUNTER — Encounter: Payer: Self-pay | Admitting: Pulmonary Disease

## 2014-11-24 ENCOUNTER — Ambulatory Visit (INDEPENDENT_AMBULATORY_CARE_PROVIDER_SITE_OTHER): Payer: Medicare Other | Admitting: Pulmonary Disease

## 2014-11-24 ENCOUNTER — Encounter (INDEPENDENT_AMBULATORY_CARE_PROVIDER_SITE_OTHER): Payer: Self-pay

## 2014-11-24 VITALS — BP 124/72 | HR 83 | Temp 97.0°F | Ht 65.5 in | Wt 160.6 lb

## 2014-11-24 DIAGNOSIS — J441 Chronic obstructive pulmonary disease with (acute) exacerbation: Secondary | ICD-10-CM

## 2014-11-24 MED ORDER — LEVOFLOXACIN 750 MG PO TABS: 750.0000 mg | ORAL_TABLET | Freq: Every day | ORAL | Status: DC

## 2014-11-24 MED ORDER — METHYLPREDNISOLONE ACETATE 80 MG/ML IJ SUSP
120.0000 mg | Freq: Once | INTRAMUSCULAR | Status: AC
  Administered 2014-11-24: 120 mg via INTRAMUSCULAR

## 2014-11-24 MED ORDER — PREDNISONE 10 MG PO TABS: ORAL_TABLET | ORAL | Status: DC

## 2014-11-24 NOTE — Progress Notes (Signed)
   Subjective:    Patient ID: [name], female    DOB: April 04, 1933, 81 y.o.   MRN: [id number]  HPI The patient comes in today for an acute sick visit. She is normally followed by Dr. Lamonte Sakai for significant COPD, and was stable until approximately 3 weeks ago when she began to develop a cough with rattling congestion. This was followed by increasing shortness of breath, and she was unable to produce mucus. She was seen by her primary physician, and treated with a short course of doxycycline as well as a course of prednisone of unknown duration. She had some improvement, but essentially she discontinued her medications, she had worsening again of her symptoms. She was seen 1 week ago by her primary again, and treated with Levaquin and prednisone for about 5 days. Again, she had improvement in her cough and congestion, as well as her shortness of breath, but has not returned to baseline. She is still having a lot of cough and rattling congestion, and has shortness of breath above baseline even at rest. However, her oxygen saturations are excellent even off oxygen during our visit. She is bringing up white with yellow tinged mucus at this time, and continues to have malaise   Review of Systems  Constitutional: Negative for fever and unexpected weight change.  HENT: Positive for congestion and postnasal drip. Negative for dental problem, ear pain, nosebleeds, rhinorrhea, sinus pressure, sneezing, sore throat and trouble swallowing.   Eyes: Negative for redness and itching.  Respiratory: Positive for cough, chest tightness, shortness of breath and stridor. Negative for wheezing.   Cardiovascular: Negative for palpitations and leg swelling.  Gastrointestinal: Negative for nausea and vomiting.  Genitourinary: Negative for dysuria.  Musculoskeletal: Negative for joint swelling.  Skin: Negative for rash.  Neurological: Negative for headaches.  Hematological: Does not bruise/bleed easily.    Psychiatric/Behavioral: Negative for dysphoric mood. The patient is not nervous/anxious.        Objective:   Physical Exam Well-developed female in no acute distress Nose without purulence or discharge noted Neck without lymphadenopathy or thyromegaly Chest with diffuse rhonchi and rattling, no definite wheezes or crackles heard. Cardiac exam with regular rate and rhythm, 3/6 systolic murmur Lower extremities with mild edema, no cyanosis Alert and oriented, moves all 4 extremities.       Assessment & Plan:

## 2014-11-24 NOTE — Patient Instructions (Signed)
Will check chest xray today and call you with results. Will give you a steroid shot today, and treat with an 8 day course of prednisone Start back on levaquin 750mg  one a day for 7 days Would like for you to see either Dr. Lamonte Sakai or our nurse practitioner next week to make sure you are doing better.  Obviously, let us know if you are not improving.

## 2014-11-24 NOTE — Assessment & Plan Note (Signed)
The patient is clearly having an acute COPD exacerbation, but it is unclear if she has persistent pulmonary infection. She is in no respiratory distress, and her saturations are adequate on room air at the time of my exam. I would like to treat her with another course of steroids that is a little more prolonged, and will also empirically treat her with another course of Levaquin. I also think we need to check an x-ray today to make sure that she doesn't have something more going on.

## 2014-12-02 ENCOUNTER — Encounter: Payer: Self-pay | Admitting: Adult Health

## 2014-12-02 ENCOUNTER — Ambulatory Visit (INDEPENDENT_AMBULATORY_CARE_PROVIDER_SITE_OTHER): Payer: Medicare Other | Admitting: Adult Health

## 2014-12-02 ENCOUNTER — Other Ambulatory Visit (HOSPITAL_BASED_OUTPATIENT_CLINIC_OR_DEPARTMENT_OTHER): Payer: Medicare Other

## 2014-12-02 ENCOUNTER — Ambulatory Visit (HOSPITAL_BASED_OUTPATIENT_CLINIC_OR_DEPARTMENT_OTHER): Payer: Medicare Other | Admitting: Hematology

## 2014-12-02 ENCOUNTER — Telehealth: Payer: Self-pay | Admitting: Hematology

## 2014-12-02 VITALS — BP 128/68 | HR 85 | Ht 65.5 in | Wt 161.0 lb

## 2014-12-02 VITALS — BP 122/73 | HR 68 | Temp 98.2°F | Resp 18 | Ht 65.5 in | Wt 159.7 lb

## 2014-12-02 DIAGNOSIS — D500 Iron deficiency anemia secondary to blood loss (chronic): Secondary | ICD-10-CM

## 2014-12-02 DIAGNOSIS — D649 Anemia, unspecified: Secondary | ICD-10-CM

## 2014-12-02 DIAGNOSIS — J449 Chronic obstructive pulmonary disease, unspecified: Secondary | ICD-10-CM

## 2014-12-02 DIAGNOSIS — D509 Iron deficiency anemia, unspecified: Secondary | ICD-10-CM

## 2014-12-02 LAB — IRON AND TIBC CHCC
%SAT: 35 % (ref 21–57)
Iron: 97 ug/dL (ref 41–142)
TIBC: 274 ug/dL (ref 236–444)
UIBC: 177 ug/dL (ref 120–384)

## 2014-12-02 LAB — CBC WITH DIFFERENTIAL/PLATELET
BASO%: 0.1 % (ref 0.0–2.0)
Basophils Absolute: 0 10*3/uL (ref 0.0–0.1)
EOS ABS: 0.1 10*3/uL (ref 0.0–0.5)
EOS%: 0.4 % (ref 0.0–7.0)
HCT: 43.9 % (ref 34.8–46.6)
HGB: 14.4 g/dL (ref 11.6–15.9)
LYMPH#: 0.9 10*3/uL (ref 0.9–3.3)
LYMPH%: 6.7 % — AB (ref 14.0–49.7)
MCH: 31 pg (ref 25.1–34.0)
MCHC: 32.8 g/dL (ref 31.5–36.0)
MCV: 94.6 fL (ref 79.5–101.0)
MONO#: 0.6 10*3/uL (ref 0.1–0.9)
MONO%: 4.5 % (ref 0.0–14.0)
NEUT%: 88.3 % — ABNORMAL HIGH (ref 38.4–76.8)
NEUTROS ABS: 12 10*3/uL — AB (ref 1.5–6.5)
PLATELETS: 239 10*3/uL (ref 145–400)
RBC: 4.64 10*6/uL (ref 3.70–5.45)
RDW: 13.7 % (ref 11.2–14.5)
WBC: 13.6 10*3/uL — AB (ref 3.9–10.3)

## 2014-12-02 LAB — FERRITIN CHCC: Ferritin: 236 ng/ml (ref 9–269)

## 2014-12-02 NOTE — Progress Notes (Signed)
[name] DATE OF SERVICE: 08/08/2014  Victoria Nakayama, MD [name] [id number]  DIAGNOSIS: Iron deficiency anemia  No chief complaint on file.   CURRENT THERAPY: Feraheme 1020 mg IV prn.  Currently we are checking CBC and ferritin every month (per her PCP's office). Patient received last Feraheme on 08/08/2014..  INTERVAL HISTORY:  [name] 81 y.o. female with a a history of anemia secondary to iron deficiency and gastrointestinal bleeding from colonic arteriovenous malformations is here for follow-up.  She was last seen by Dr Lona Kettle 4 month ago, who has left the practice.  She has been doing very well since her last visit.  She denied any bleeding episodes including hematochezia, melana, hemoptysis, hematuria or epitaxis. No mucosal bleeding or easy bruising. She has good energy level and appetite. She had no ED visit of hospitalization in the past 4 months. She has had lab done at outside facility monthly, and her last ferritin was 228 in early March.   MEDICAL HISTORY: Past Medical History  Diagnosis Date  . COPD (chronic obstructive pulmonary disease)   . Hypertrophic obstructive cardiomyopathy     Echo 10/10 EF 65-70% SW 1.6 PW 1.4 mild SAM no LVOT gradient at rest. Valsalva not performed Mild MR. No hyperenhance ment on MRI EF 69%.  . Coronary atherosclerosis of native coronary artery     Cath 1/11: RCA 50-60 ostial o/w normal. EF 70%.   . Pulmonary hypertension     Cath 1/11 RA 8, RV 43/6/14, PA 42/17 (29) PCWP 15 Fick  5.0/2.6. PVR 2.8    . Edema   . Occlusion and stenosis of carotid artery     s/p L CEA u/s 3/11. R 60-79%; L 40-59%. no change since 3/10  . Peripheral vascular disease, unspecified   . Hypertension   . Emphysema   . Asthma   . Hypothyroidism   . Allergic rhinitis   . Gastric AVM   . Heart murmur   . Anginal pain     "pressure"  . Emphysema   . Oxygen dependent 04/23/2012     "concentrated at night; liquid during day"  . Dyspnea 04/23/2012    "all the time"  . Anemia 04/23/2012    "severe @ times; think caused by colonic AVMs"  . History of blood transfusion     "many times"  . H/O hiatal hernia   . Stroke ~ 2004    denies residual on 04/23/2012  . Arthritis     "probably"  . Breast cancer   . Pneumonia     INTERIM HISTORY: has HYPOTHYROIDISM; HYPERTENSION; CAD, NATIVE VESSEL; PULMONARY HYPERTENSION, SECONDARY; Hypertrophic obstructive cardiomyopathy; CAROTID ARTERY DISEASE; C V A / STROKE; PVD; ALLERGIC RHINITIS; EMPHYSEMA; ASTHMA; COPD (chronic obstructive pulmonary disease); EDEMA; DYSPNEA; ABNORMAL ECHOCARDIOGRAM; ABNORMAL EKG; BREAST CANCER, HX OF; HYPOTHYROIDISM; GERD (gastroesophageal reflux disease); Exertional angina; Chest pain on exertion; CAD (coronary artery disease); Microcytic anemia; Angiodysplasia of intestine with hemorrhage; Nonspecific abnormal finding in stool contents; GI bleed; Acute blood loss anemia; Fever; Sepsis; Acute renal failure; Normocytic anemia; Elevated brain natriuretic peptide (BNP) level; HCAP (healthcare-associated pneumonia); Diastolic CHF, acute on chronic; COPD exacerbation; and Iron deficiency anemia on her problem list.    ALLERGIES:  is allergic to latex.  MEDICATIONS: has a current medication list which includes the following prescription(s): albuterol, aspirin, atorvastatin, budesonide-formoterol, calcium carbonate, cholecalciferol, dextromethorphan, furosemide, levothyroxine, one-a-day extras antioxidant, NON FORMULARY, polyethylene glycol, tolterodine, and umeclidinium bromide.  SURGICAL HISTORY:  Past Surgical History  Procedure Laterality Date  . Carotid endarterectomy  2005    left  . Thyroidectomy  2007  . Appendectomy  1950  . Breast lumpectomy  2002    right-hx of radiation  . Dilation and curettage of uterus  1950's  . Cataract extraction w/ intraocular lens implant  02/2011    right  . Excisional  hemorrhoidectomy  1950's  . Cardiac catheterization    . Colonoscopy N/A 01/12/2014    Procedure: COLONOSCOPY;  Surgeon: Gatha Mayer, MD;  Location: [facility];  Service: Endoscopy;  Laterality: N/A;  MAC if available   PROBLEM LIST:  1. Anemia secondary to iron deficiency and gastrointestinal bleeding from colonic arteriovenous malformations. The patient received IV INFeD 1025 mg on 04/24/2012 when she was hospitalized. She has also received Feraheme 1020 mg IV on 07/17/2012, 08/21/2012 and 12/07/2012.  2. Chronic obstructive pulmonary disease (COPD) diagnosed in 1999, currently on home oxygen at 3 L/minute at rest and during sleep. The patient uses 4 L per minute with exertion. She has been on home oxygen for approximately the past 5 years.  3. Pulmonary hypertension.  4. Right frontal arteriovenous malformation (AVM).  5. Coronary artery disease.  6. Diastolic dysfunction.  7. Past history of multiple strokes.  8. History of left carotid stenosis with left carotid endarterectomy  in 2006 at [facility]  9. Hypertension.  10. Dyslipidemia.  11. Ductal carcinoma in situ involving the right breast, status post  lumpectomy, radiation, and 5 years of tamoxifen.  12. Peripheral vascular disease.  13. Hiatal hernia.  14. Status post thyroidectomy in 2009 or 2010.  15. Osteoporosis.  16. Stroke involving right eye in April 2011, now with vision restored.  17. Diverticulosis.  18. Hearing impairment.  19. Systolic ejection murmur.  20. Hoarseness noted around December 2013. This was evaluated by an ENT specialist.   REVIEW OF SYSTEMS:   Constitutional: Denies fevers, chills or abnormal weight loss Eyes: Denies blurriness of vision Ears, nose, mouth, throat, and face: Denies mucositis or sore throat Respiratory: Denies cough, dyspnea or wheezes Cardiovascular: Denies palpitation, chest discomfort or lower extremity swelling Gastrointestinal:  Denies nausea, heartburn or change in  bowel habits Skin: Denies abnormal skin rashes Lymphatics: Denies new lymphadenopathy or easy bruising Neurological:Denies numbness, tingling or new weaknesses Behavioral/Psych: Mood is stable, no new changes  All other systems were reviewed with the patient and are negative.  PHYSICAL EXAMINATION: ECOG PERFORMANCE STATUS: 0-1  Blood pressure 122/73, pulse 68, temperature 98.2 F (36.8 C), temperature source Oral, resp. rate 18, height 5' 5.5" (1.664 m), weight 159 lb 11.2 oz (72.439 kg).  GENERAL:alert, no distress and comfortable; elderly female who appears her stated age.  SKIN: skin color, texture, turgor are normal, no rashes or significant lesions EYES: normal, Conjunctiva are pink and non-injected, sclera clear OROPHARYNX:no exudate, no erythema and lips, buccal mucosa, and tongue normal  NECK: supple, thyroid normal size, non-tender, without nodularity LYMPH:  no palpable lymphadenopathy in the cervical, axillary or supraclavicular LUNGS: clear to auscultation and percussion with normal breathing effort HEART: regular rate & rhythm and no murmurs and no lower extremity edema ABDOMEN:abdomen soft, non-tender and normal bowel sounds Musculoskeletal:no cyanosis of digits and no clubbing  NEURO: alert & oriented x 3 with fluent speech, no focal motor/sensory deficits  LABORATORY DATA: CBC Latest Ref Rng 12/02/2014 08/08/2014 05/11/2014  WBC 3.9 - 10.3 10e3/uL 13.6(H) 9.0 8.7  Hemoglobin 11.6 - 15.9 g/dL 14.4 13.0  13.4  Hematocrit 34.8 - 46.6 % 43.9 40.6 41.6  Platelets 145 - 400 10e3/uL 239 239 250    CMP Latest Ref Rng 08/08/2014 05/11/2014 01/20/2014  Glucose 70 - 140 mg/dl 128 100 128(H)  BUN 7.0 - 26.0 mg/dL 21.2 20.7 22  Creatinine 0.6 - 1.1 mg/dL 0.8 0.8 1.32(H)  Sodium 136 - 145 mEq/L 142 142 139  Potassium 3.5 - 5.1 mEq/L 4.0 4.3 3.6(L)  Chloride 96 - 112 mEq/L - - 95(L)  CO2 22 - 29 mEq/L 33(H) 35(H) 35(H)  Calcium 8.4 - 10.4 mg/dL 9.4 9.5 9.3  Total Protein 6.4 -  8.3 g/dL 6.8 7.4 -  Total Bilirubin 0.20 - 1.20 mg/dL 0.51 0.39 -  Alkaline Phos 40 - 150 U/L 88 84 -  AST 5 - 34 U/L 19 18 -  ALT 0 - 55 U/L 13 13 -   Results for TYRIKA, NEWMAN ANN (MRN [id number]) as of 12/03/2014 10:28  Ref. Range 08/08/2014 11:23 12/02/2014 10:02  Iron Latest Range: 42-145 ug/dL 103 97  UIBC Latest Range: 125-400 ug/dL 238 177  TIBC Latest Range: 250-470 ug/dL 341 274  %SAT Latest Range: 20-55 % 30 35  Ferritin Latest Range: 10-291 ng/mL 28 236    RADIOGRAPHIC STUDIES: No results found.  ASSESSMENT: Killian Ress 81 y.o. female with a history of Iron deficiency anemia   PLAN:  1. Anemia secondary to iron deficiency and GI bleeding.  -She is clinically doing well. She responded to IV iron very well, last Shirlean Kelly was in November 2015. She received Feraheme every 1-3 months in the last year. -Her I'll level today is adequate. Normal H and H -I'll set up her lab with CBC and Feraheme monthly at her local lab facility, the result will be faxed to me. -IV Feraheme if ferritin less than 100 -I'll see her back in 4 months.  All questions were answered. The patient knows to call the clinic with any problems, questions or concerns. We can certainly see the patient much sooner if necessary.  I spent 20 minutes counseling the patient face to face. The total time spent in the appointment was 25 minutes.   Plan -monthly lab at local lab, and in feraheme if ferritin<100 -RTC in 4 month   Truitt Merle 12/02/2014

## 2014-12-02 NOTE — Telephone Encounter (Signed)
Gave avs & calendar for July °

## 2014-12-02 NOTE — Patient Instructions (Addendum)
Use Mucinex Twice daily  As needed  Cough/congestion .  Delsym 2 tsp Twice daily  As needed  Cough.  Continue on INCRUSE ., rinse after use.  Follow up with Dr. Lamonte Sakai  As planned and As needed   Please contact office for sooner follow up if symptoms do not improve or worsen or seek emergency care

## 2014-12-02 NOTE — Progress Notes (Signed)
   Subjective:    Patient ID: [name], female    DOB: 1932-12-10, 81 y.o.   MRN: [id number]  HPI  81 yo Severe COPD, HTN + diastolic dysfxn, hypoxemia  12/02/2014 Follow up  Returns for follow up for AECOPD  tx last week with levaquin and steroids .  She si starting to feel better.  Cough is less. No fever, chest pain, orthopnea, edema or hemoptysis.     Review of Systems Constitutional:   No  weight loss, night sweats,  Fevers, chills,  +fatigue, or  lassitude.  HEENT:   No headaches,  Difficulty swallowing,  Tooth/dental problems, or  Sore throat,                No sneezing, itching, ear ache,  +nasal congestion, post nasal drip,   CV:  No chest pain,  Orthopnea, PND, swelling in lower extremities, anasarca, dizziness, palpitations, syncope.   GI  No heartburn, indigestion, abdominal pain, nausea, vomiting, diarrhea, change in bowel habits, loss of appetite, bloody stools.   Resp:  .  No chest wall deformity  Skin: no rash or lesions.  GU: no dysuria, change in color of urine, no urgency or frequency.  No flank pain, no hematuria   MS:  No joint pain or swelling.  No decreased range of motion.  No back pain.  Psych:  No change in mood or affect. No depression or anxiety.  No memory loss.         Objective:   Physical Exam GEN: A/Ox3; pleasant , NAD, elderly   HEENT:  [place]/AT,  EACs-clear, TMs-wnl, NOSE-clear, THROAT-clear, no lesions, no postnasal drip or exudate noted.   NECK:  Supple w/ fair ROM; no JVD; normal carotid impulses w/o bruits; no thyromegaly or nodules palpated; no lymphadenopathy.  RESP  Decreased BS in bases no wheezing no accessory muscle use, no dullness to percussion  CARD:  RRR, no m/r/g  , no peripheral edema, pulses intact, no cyanosis or clubbing.  GI:   Soft & nt; nml bowel sounds; no organomegaly or masses detected.  Musco: Warm bil, no deformities or joint swelling noted.   Neuro: alert, no focal deficits noted.    Skin:  Warm, no lesions or rashes         Assessment & Plan:

## 2014-12-02 NOTE — Assessment & Plan Note (Signed)
Recent slow to resolve flare now resolving   Plan  Use Mucinex Twice daily  As needed  Cough/congestion .  Delsym 2 tsp Twice daily  As needed  Cough.  Continue on INCRUSE ., rinse after use.  Follow up with Dr. Lamonte Sakai  As planned and As needed   Please contact office for sooner follow up if symptoms do not improve or worsen or seek emergency care

## 2014-12-03 ENCOUNTER — Encounter: Payer: Self-pay | Admitting: Hematology

## 2014-12-03 DIAGNOSIS — D500 Iron deficiency anemia secondary to blood loss (chronic): Secondary | ICD-10-CM | POA: Insufficient documentation

## 2015-01-09 ENCOUNTER — Encounter: Payer: Self-pay | Admitting: Hematology

## 2015-01-12 ENCOUNTER — Ambulatory Visit (INDEPENDENT_AMBULATORY_CARE_PROVIDER_SITE_OTHER): Payer: Medicare Other | Admitting: Emergency Medicine

## 2015-01-12 ENCOUNTER — Encounter: Payer: Self-pay | Admitting: Emergency Medicine

## 2015-01-12 ENCOUNTER — Ambulatory Visit: Payer: Medicare Other | Admitting: Emergency Medicine

## 2015-01-12 VITALS — BP 138/72 | HR 56 | Ht 64.0 in | Wt 158.0 lb

## 2015-01-12 DIAGNOSIS — J449 Chronic obstructive pulmonary disease, unspecified: Secondary | ICD-10-CM | POA: Diagnosis not present

## 2015-01-12 MED ORDER — BUDESONIDE-FORMOTEROL FUMARATE 160-4.5 MCG/ACT IN AERO: 2.0000 | INHALATION_SPRAY | Freq: Two times a day (BID) | RESPIRATORY_TRACT | Status: DC

## 2015-01-12 MED ORDER — UMECLIDINIUM BROMIDE 62.5 MCG/INH IN AEPB: 1.0000 | INHALATION_SPRAY | Freq: Every day | RESPIRATORY_TRACT | Status: DC

## 2015-01-12 MED ORDER — ALBUTEROL SULFATE HFA 108 (90 BASE) MCG/ACT IN AERS: 2.0000 | INHALATION_SPRAY | Freq: Four times a day (QID) | RESPIRATORY_TRACT | Status: DC | PRN

## 2015-01-12 NOTE — Assessment & Plan Note (Signed)
Restart Zyrtec and continue this through the spring.

## 2015-01-12 NOTE — Assessment & Plan Note (Addendum)
Improved and clinically stable following an acute exacerbation. It seems that the flare was caused by an exposure and a viral upper respiratory infection. We will continue her current medications and refill these today. She has a Dietitian in 2015

## 2015-01-12 NOTE — Patient Instructions (Signed)
Please continue the medications you are taking  Restart and continue Zyrtec through the Spring season Follow with Dr Lamonte Sakai in 3 months or sooner if you have any problems.

## 2015-01-12 NOTE — Progress Notes (Signed)
   Subjective:    Patient ID: [name], female    DOB: February 16, 1933, 81 y.o.   MRN: [id number]  HPI  81 yo Severe COPD, HTN + diastolic dysfxn, hypoxemia  ROV 07/20/14 -- follow up visit for Severe COPD, HTN + diastolic dysfxn, hypoxemia, anemia. She has been doing well since last visit. She has been wearing O2 at night and in the day prn. She is on spiriva and symbicort, but has received info that says her formulary is changing Spiriva to Incruse. She needs new script for all of her inhalers. No exacerbations since last time. Uses SABA about once every day.   ROV 09/20/14 -- f/u for severe COPD. She has been doing well - no flares, no pred, no abx. Has not required SABA very often.  Follow up 12/02/14 Returns for follow up for AECOPD  tx last week with levaquin and steroids .  She si starting to feel better.  Cough is less. No fever, chest pain, orthopnea, edema or hemoptysis.   ROV 01/12/15 -- follow-up visit for severe COPD. She was seen by Dr. Gwenette Greet and then by TP for acute exacerbation of her COPD. She was treated with levofloxacin and corticosteroids with improvement. She was treated before that with other abx prior to the levaquin. She is on symbicort and incruse. She uses SABA a few times a week. No real cough or wheeze since her AE. She is taking zyrtec.    Review of Systems As per HPI     Objective:   Physical Exam  Filed Vitals:   01/12/15 1049  BP: 138/72  Pulse: 56  Height: 5\' 4"  (1.626 m)  Weight: 158 lb (71.668 kg)  SpO2: 92%    GEN: A/Ox3; pleasant , NAD, elderly   HEENT:  [place]/AT,  EACs-clear, TMs-wnl, NOSE-clear, THROAT-clear, no lesions, no postnasal drip or exudate noted.   NECK:  Supple w/ fair ROM; no JVD; normal carotid impulses w/o bruits; no thyromegaly or nodules palpated; no lymphadenopathy.  RESP  Decreased BS, very distant  CARD:  RRR, no m/r/g  , no peripheral edema, pulses intact, no cyanosis or clubbing.  Musco: Warm bil, no deformities or  joint swelling noted.   Neuro: alert, no focal deficits noted.    Skin: Warm, no lesions or rashes      Assessment & Plan:  COPD (chronic obstructive pulmonary disease) Improved and clinically stable following an acute exacerbation. It seems that the flare was caused by an exposure and a viral upper respiratory infection. We will continue her current medications and refill these today. She has a Prevnar in 2015   [place] and continue this through the spring.

## 2015-04-03 ENCOUNTER — Other Ambulatory Visit: Payer: Self-pay | Admitting: *Deleted

## 2015-04-03 DIAGNOSIS — D509 Iron deficiency anemia, unspecified: Secondary | ICD-10-CM

## 2015-04-04 ENCOUNTER — Encounter: Payer: Self-pay | Admitting: Hematology

## 2015-04-04 ENCOUNTER — Other Ambulatory Visit (HOSPITAL_BASED_OUTPATIENT_CLINIC_OR_DEPARTMENT_OTHER): Payer: Medicare Other

## 2015-04-04 ENCOUNTER — Telehealth: Payer: Self-pay | Admitting: Hematology

## 2015-04-04 ENCOUNTER — Ambulatory Visit (HOSPITAL_BASED_OUTPATIENT_CLINIC_OR_DEPARTMENT_OTHER): Payer: Medicare Other | Admitting: Hematology

## 2015-04-04 ENCOUNTER — Ambulatory Visit (HOSPITAL_BASED_OUTPATIENT_CLINIC_OR_DEPARTMENT_OTHER): Payer: Medicare Other

## 2015-04-04 VITALS — BP 141/71 | HR 74 | Temp 98.4°F | Resp 17 | Ht 64.0 in | Wt 162.5 lb

## 2015-04-04 DIAGNOSIS — D509 Iron deficiency anemia, unspecified: Secondary | ICD-10-CM

## 2015-04-04 DIAGNOSIS — K5521 Angiodysplasia of colon with hemorrhage: Secondary | ICD-10-CM

## 2015-04-04 DIAGNOSIS — D500 Iron deficiency anemia secondary to blood loss (chronic): Secondary | ICD-10-CM

## 2015-04-04 LAB — CBC WITH DIFFERENTIAL/PLATELET
BASO%: 0.2 % (ref 0.0–2.0)
Basophils Absolute: 0 10*3/uL (ref 0.0–0.1)
EOS%: 0.3 % (ref 0.0–7.0)
Eosinophils Absolute: 0 10*3/uL (ref 0.0–0.5)
HEMATOCRIT: 41.3 % (ref 34.8–46.6)
HEMOGLOBIN: 13.4 g/dL (ref 11.6–15.9)
LYMPH#: 1.4 10*3/uL (ref 0.9–3.3)
LYMPH%: 11.5 % — ABNORMAL LOW (ref 14.0–49.7)
MCH: 31.5 pg (ref 25.1–34.0)
MCHC: 32.4 g/dL (ref 31.5–36.0)
MCV: 96.9 fL (ref 79.5–101.0)
MONO#: 0.6 10*3/uL (ref 0.1–0.9)
MONO%: 5.1 % (ref 0.0–14.0)
NEUT%: 82.9 % — AB (ref 38.4–76.8)
NEUTROS ABS: 10.2 10*3/uL — AB (ref 1.5–6.5)
Platelets: 304 10*3/uL (ref 145–400)
RBC: 4.26 10*6/uL (ref 3.70–5.45)
RDW: 12.6 % (ref 11.2–14.5)
WBC: 12.3 10*3/uL — ABNORMAL HIGH (ref 3.9–10.3)

## 2015-04-04 LAB — FERRITIN CHCC: FERRITIN: 68 ng/mL (ref 9–269)

## 2015-04-04 MED ORDER — SODIUM CHLORIDE 0.9 % IV SOLN
510.0000 mg | Freq: Once | INTRAVENOUS | Status: AC
  Administered 2015-04-04: 510 mg via INTRAVENOUS
  Filled 2015-04-04: qty 17

## 2015-04-04 MED ORDER — SODIUM CHLORIDE 0.9 % IV SOLN
Freq: Once | INTRAVENOUS | Status: AC
  Administered 2015-04-04: 14:00:00 via INTRAVENOUS

## 2015-04-04 NOTE — Progress Notes (Signed)
[facility] PROGRESS NOTE DATE OF SERVICE: 04/04/2015   Victoria Nakayama, MD [place] [address]  DIAGNOSIS: Iron deficiency anemia  No chief complaint on file.   CURRENT THERAPY: Feraheme 1020 mg IV prn.  Currently we are checking CBC and ferritin every month (per her PCP's office). Patient received last Feraheme on 08/08/2014.  INTERVAL HISTORY:  [name] 81 y.o. female with a a history of anemia secondary to iron deficiency and gastrointestinal bleeding from colonic arteriovenous malformations is here for follow-up.  She was last seen by me 4 months ago. She presents to the clinic with her daughter and 2 grandchildren.  She reports she is doing very well. Her daughter does notice she is slightly more fatigued and pale lately. She is able to tolerate her daily activity without much difficulty. She otherwise denies any chest pain, dyspnea, or any other symptoms. She has a few mild bruise on her arms, no other bleeding signs.  According to her daughter, she had a ferritin level checked at a local facility which was 205 on 02/03/2015, 138 on 02/16/2015 and 97 about 3 weeks ago. I'm in waiting for those results to be faxed to my office.  MEDICAL HISTORY: Past Medical History  Diagnosis Date  . COPD (chronic obstructive pulmonary disease)   . Hypertrophic obstructive cardiomyopathy     Echo 10/10 EF 65-70% SW 1.6 PW 1.4 mild SAM no LVOT gradient at rest. Valsalva not performed Mild MR. No hyperenhance ment on MRI EF 69%.  . Coronary atherosclerosis of native coronary artery     Cath 1/11: RCA 50-60 ostial o/w normal. EF 70%.   . Pulmonary hypertension     Cath 1/11 RA 8, RV 43/6/14, PA 42/17 (29) PCWP 15 Fick  5.0/2.6. PVR 2.8    . Edema   . Occlusion and stenosis of carotid artery     s/p L CEA u/s 3/11. R 60-79%; L 40-59%. no change since 3/10  . Peripheral vascular disease, unspecified   . Hypertension   . Emphysema    . Asthma   . Hypothyroidism   . Allergic rhinitis   . Gastric AVM   . Heart murmur   . Anginal pain     "pressure"  . Emphysema   . Oxygen dependent 04/23/2012    "concentrated at night; liquid during day"  . Dyspnea 04/23/2012    "all the time"  . Anemia 04/23/2012    "severe @ times; think caused by colonic AVMs"  . History of blood transfusion     "many times"  . H/O hiatal hernia   . Stroke ~ 2004    denies residual on 04/23/2012  . Arthritis     "probably"  . Breast cancer   . Pneumonia     INTERIM HISTORY: has HYPOTHYROIDISM; HYPERTENSION; CAD, NATIVE VESSEL; PULMONARY HYPERTENSION, SECONDARY; Hypertrophic obstructive cardiomyopathy(425.11); CAROTID ARTERY DISEASE; C V A / STROKE; PVD; ALLERGIC RHINITIS; EMPHYSEMA; ASTHMA; COPD (chronic obstructive pulmonary disease); Edema; DYSPNEA; ABNORMAL ECHOCARDIOGRAM; ABNORMAL EKG; BREAST CANCER, HX OF; HYPOTHYROIDISM; GERD (gastroesophageal reflux disease); Exertional angina; Chest pain on exertion; CAD (coronary artery disease); Microcytic anemia; Angiodysplasia of intestine with hemorrhage; Nonspecific abnormal finding in stool contents; GI bleed; Acute blood loss anemia; Fever; Sepsis; Acute renal failure; Normocytic anemia; Elevated brain natriuretic peptide (BNP) level; HCAP (healthcare-associated pneumonia); Diastolic CHF, acute on chronic; COPD exacerbation; and Iron deficiency anemia on her problem list.    ALLERGIES:  is allergic to latex.  MEDICATIONS: has a current medication list which includes the following prescription(s): albuterol, alprazolam, aspirin, atorvastatin, budesonide-formoterol, calcium carbonate, cholecalciferol, dextromethorphan, escitalopram, furosemide, levothyroxine, metoprolol succinate, one-a-day extras antioxidant, NON FORMULARY, polyethylene glycol, prednisone, tolterodine, and umeclidinium bromide.  SURGICAL HISTORY:  Past Surgical History  Procedure Laterality Date  . Carotid endarterectomy  2005    left   . Thyroidectomy  2007  . Appendectomy  1950  . Breast lumpectomy  2002    right-hx of radiation  . Dilation and curettage of uterus  1950's  . Cataract extraction w/ intraocular lens implant  02/2011    right  . Excisional hemorrhoidectomy  1950's  . Cardiac catheterization    . Colonoscopy N/A 01/12/2014    Procedure: COLONOSCOPY;  Surgeon: Gatha Mayer, MD;  Location: [facility];  Service: Endoscopy;  Laterality: N/A;  MAC if available   PROBLEM LIST:  1. Anemia secondary to iron deficiency and gastrointestinal bleeding from colonic arteriovenous malformations. The patient received IV INFeD 1025 mg on 04/24/2012 when she was hospitalized. She has also received Feraheme 1020 mg IV on 07/17/2012, 08/21/2012 and 12/07/2012.  2. Chronic obstructive pulmonary disease (COPD) diagnosed in 1999, currently on home oxygen at 3 L/minute at rest and during sleep. The patient uses 4 L per minute with exertion. She has been on home oxygen for approximately the past 5 years.  3. Pulmonary hypertension.  4. Right frontal arteriovenous malformation (AVM).  5. Coronary artery disease.  6. Diastolic dysfunction.  7. Past history of multiple strokes.  8. History of left carotid stenosis with left carotid endarterectomy  in 2006 at [facility]  9. Hypertension.  10. Dyslipidemia.  11. Ductal carcinoma in situ involving the right breast, status post  lumpectomy, radiation, and 5 years of tamoxifen.  12. Peripheral vascular disease.  13. Hiatal hernia.  14. Status post thyroidectomy in 2009 or 2010.  15. Osteoporosis.  16. Stroke involving right eye in April 2011, now with vision restored.  17. Diverticulosis.  18. Hearing impairment.  19. Systolic ejection murmur.  20. Hoarseness noted around December 2013. This was evaluated by an ENT specialist.   REVIEW OF SYSTEMS:   Constitutional: Denies fevers, chills or abnormal weight loss Eyes: Denies blurriness of vision Ears, nose, mouth,  throat, and face: Denies mucositis or sore throat Respiratory: Denies cough, dyspnea or wheezes Cardiovascular: Denies palpitation, chest discomfort or lower extremity swelling Gastrointestinal:  Denies nausea, heartburn or change in bowel habits Skin: Denies abnormal skin rashes Lymphatics: Denies new lymphadenopathy or easy bruising Neurological:Denies numbness, tingling or new weaknesses Behavioral/Psych: Mood is stable, no new changes  All other systems were reviewed with the patient and are negative.  PHYSICAL EXAMINATION: ECOG PERFORMANCE STATUS: 0-1  Blood pressure 141/71, pulse 74, temperature 98.4 F (36.9 C), temperature source Oral, resp. rate 17, height 5\' 4"  (1.626 m), weight 162 lb 8 oz (73.71 kg), SpO2 94 %.  GENERAL:alert, no distress and comfortable; elderly female who appears her stated age.  SKIN: skin color, texture, turgor are normal, no rashes or significant lesions EYES: normal, Conjunctiva are pink and non-injected, sclera clear OROPHARYNX:no exudate, no erythema and lips, buccal mucosa, and tongue normal  NECK: supple, thyroid normal size, non-tender, without nodularity LYMPH:  no palpable lymphadenopathy in the cervical, axillary or supraclavicular LUNGS: clear to auscultation and percussion with normal breathing effort HEART: regular rate & rhythm and no murmurs and no lower extremity edema ABDOMEN:abdomen soft, non-tender and normal bowel sounds Musculoskeletal:no cyanosis of digits and no clubbing  NEURO: alert & oriented x 3 with fluent speech, no focal motor/sensory deficits  LABORATORY DATA: CBC Latest Ref Rng 04/04/2015 12/02/2014 08/08/2014  WBC 3.9 - 10.3 10e3/uL 12.3(H) 13.6(H) 9.0  Hemoglobin 11.6 - 15.9 g/dL 13.4 14.4 13.0  Hematocrit 34.8 - 46.6 % 41.3 43.9 40.6  Platelets 145 - 400 10e3/uL 304 239 239    CMP Latest Ref Rng 08/08/2014 05/11/2014 01/27/2014  Glucose 70 - 140 mg/dl 128 100 -  BUN 7.0 - 26.0 mg/dL 21.2 20.7 -  Creatinine 0.6 -  1.1 mg/dL 0.8 0.8 1.14  Sodium 136 - 145 mEq/L 142 142 -  Potassium 3.5 - 5.1 mEq/L 4.0 4.3 -  Chloride 96 - 112 mEq/L - - -  CO2 22 - 29 mEq/L 33(H) 35(H) -  Calcium 8.4 - 10.4 mg/dL 9.4 9.5 -  Total Protein 6.4 - 8.3 g/dL 6.8 7.4 -  Total Bilirubin 0.20 - 1.20 mg/dL 0.51 0.39 -  Alkaline Phos 40 - 150 U/L 88 84 -  AST 5 - 34 U/L 19 18 -  ALT 0 - 55 U/L 13 13 -   Results for AKILI, CUDA ANN (MRN [id number]) as of 12/03/2014 10:28  Ref. Range 08/08/2014 11:23 12/02/2014 10:02  Iron Latest Range: 42-145 ug/dL 103 97  UIBC Latest Range: 125-400 ug/dL 238 177  TIBC Latest Range: 250-470 ug/dL 341 274  %SAT Latest Range: 20-55 % 30 35  Ferritin Latest Range: 10-291 ng/mL 28 236    RADIOGRAPHIC STUDIES: No results found.  ASSESSMENT: [name] 81 y.o. female with a history of Iron deficiency anemia   PLAN:  1. Anemia secondary to iron deficiency and GI bleeding.  -She is clinically doing well. She responded to IV iron very well, last Shirlean Kelly was in November 2015. She received Feraheme every 1-3 months in the last year. -Her hemoglobin is 13.4 today, was 14.4 4 months ago. Today's ferritin level is still pending. -Given her trending down of our ferritin level, and was below 103 weeks ago, I'll give her her Feraheme infusion today. If her ferritin level is below 50, I'll set up Feraheme infusion next week also  -I'll continue her lab with CBC and Feraheme monthly at her local lab facility, the result will be faxed to me. -IV Feraheme if ferritin less than 100 -I'll see her back in 4 months.  All questions were answered. The patient knows to call the clinic with any problems, questions or concerns. We can certainly see the patient much sooner if necessary.  I spent 20 minutes counseling the patient face to face. The total time spent in the appointment was 25 minutes.   Plan -Feraheme today, and a possible second dose if ferritin level less than 50. -monthly lab at local lab,  and in feraheme if ferritin<100 -RTC in 4 month   Truitt Merle  04/04/2015

## 2015-04-04 NOTE — Telephone Encounter (Signed)
Gave and printed appt sched and avs for pt for July and NOV °

## 2015-04-04 NOTE — Patient Instructions (Signed)

## 2015-04-11 ENCOUNTER — Ambulatory Visit: Payer: Medicare Other

## 2015-04-11 ENCOUNTER — Telehealth: Payer: Self-pay

## 2015-04-11 NOTE — Telephone Encounter (Signed)
Discussed labs with Dr. Burr Medico. Treatment cancelled for today per Dr. Burr Medico. Pt to have lab work monthly. Pt aware and verbalizes understanding.

## 2015-04-14 ENCOUNTER — Ambulatory Visit (INDEPENDENT_AMBULATORY_CARE_PROVIDER_SITE_OTHER): Payer: Medicare Other | Admitting: Emergency Medicine

## 2015-04-14 ENCOUNTER — Encounter: Payer: Self-pay | Admitting: Emergency Medicine

## 2015-04-14 VITALS — BP 110/60 | HR 92 | Ht 66.0 in | Wt 163.0 lb

## 2015-04-14 DIAGNOSIS — J309 Allergic rhinitis, unspecified: Secondary | ICD-10-CM | POA: Diagnosis not present

## 2015-04-14 DIAGNOSIS — J449 Chronic obstructive pulmonary disease, unspecified: Secondary | ICD-10-CM

## 2015-04-14 MED ORDER — FLUTICASONE PROPIONATE 50 MCG/ACT NA SUSP: 2.0000 | Freq: Every day | NASAL | Status: DC

## 2015-04-14 NOTE — Patient Instructions (Signed)
We will consult [facility] to provide a speech therapist  Please continue your Incruse and Symbicort as ordered. Rinse and gargle after using the symbicort Please continue your oxygen with exertion Continue Zyrtec once a day Start using her fluticasone nasal spray, 2 sprays each nostril every day for the next 10 days. Then you can go back to using as needed Follow with Dr Lamonte Sakai in 4 months or sooner if you have any problems.

## 2015-04-14 NOTE — Assessment & Plan Note (Signed)
Currently clinically stable. She has tolerated the change from Spiriva to Incruse. We will continue this and Symbicort. She is using oxygen with exertion. She requests a referral for the speech therapist associated with [facility] to visit her home. Try to arrange for this

## 2015-04-14 NOTE — Progress Notes (Signed)
   Subjective:    Patient ID: [name], female    DOB: 04/09/1933, 81 y.o.   MRN: [id number]  HPI  81 yo Severe COPD, HTN + diastolic dysfxn, hypoxemia  ROV 07/20/14 -- follow up visit for Severe COPD, HTN + diastolic dysfxn, hypoxemia, anemia. She has been doing well since last visit. She has been wearing O2 at night and in the day prn. She is on spiriva and symbicort, but has received info that says her formulary is changing Spiriva to Incruse. She needs new script for all of her inhalers. No exacerbations since last time. Uses SABA about once every day.   ROV 09/20/14 -- f/u for severe COPD. She has been doing well - no flares, no pred, no abx. Has not required SABA very often.  Follow up 12/02/14 Returns for follow up for AECOPD  tx last week with levaquin and steroids .  She si starting to feel better.  Cough is less. No fever, chest pain, orthopnea, edema or hemoptysis.   ROV 01/12/15 -- follow-up visit for severe COPD. She was seen by Dr. Gwenette Greet and then by TP for acute exacerbation of her COPD. She was treated with levofloxacin and corticosteroids with improvement. She was treated before that with other abx prior to the levaquin. She is on symbicort and incruse. She uses SABA a few times a week. No real cough or wheeze since her AE. She is taking zyrtec.   ROV 04/14/15 -- hx of severe COPD, FEV1 1.5L, 69% pred in 2010. She feels that she is doing well. She is now on Incruse and Symbicort. She is having nasal congestion,  hoarse voice. No wheeze or cough. No flares since last time. She uses O2 with exertion, checks her SpO2 regularly. She is on zyrtec qd. She uses flonase prn, about once a week.     Review of Systems As per HPI   CAT Score 03/04/2013 09/17/2012 08/03/2012  Total CAT Score 16 9 12         Objective:   Physical Exam  Filed Vitals:   04/14/15 1330  BP: 110/60  Pulse: 92  Height: 5\' 6"  (1.676 m)  Weight: 163 lb (73.936 kg)  SpO2: 91%    GEN: A/Ox3; pleasant  , NAD, elderly   HEENT:  [place]/AT,  EACs-clear, TMs-wnl, NOSE-clear, THROAT-clear, no lesions, no postnasal drip or exudate noted.   NECK:  Supple w/ fair ROM; no JVD; normal carotid impulses w/o bruits; no thyromegaly or nodules palpated; no lymphadenopathy.  RESP  Decreased BS, very distant  CARD:  RRR, no m/r/g  , no peripheral edema, pulses intact, no cyanosis or clubbing.  Musco: Warm bil, no deformities or joint swelling noted.   Neuro: alert, no focal deficits noted.    Skin: Warm, no lesions or rashes      Assessment & Plan:  COPD (chronic obstructive pulmonary disease) Currently clinically stable. She has tolerated the change from Spiriva to Incruse. We will continue this and Symbicort. She is using oxygen with exertion. She requests a referral for the speech therapist associated with [facility] to visit her home. Try to arrange for this  Allergic rhinitis Continue Zyrtec daily She will increase her fluticasone nasal spray 2 once a day every day for the next 10 days.

## 2015-04-14 NOTE — Assessment & Plan Note (Signed)
Continue Zyrtec daily She will increase her fluticasone nasal spray 2 once a day every day for the next 10 days.

## 2015-05-08 ENCOUNTER — Telehealth: Payer: Self-pay | Admitting: Hematology

## 2015-05-08 NOTE — Telephone Encounter (Signed)
pt cld to move appt & fera-sent MW email to move fera-r/s appt-adv pt i would call once reply

## 2015-05-09 ENCOUNTER — Telehealth: Payer: Self-pay | Admitting: Hematology

## 2015-05-09 NOTE — Telephone Encounter (Signed)
per reply from [place] sh feraheme-cld & spoke to pt and adv of times & dates-pt understood

## 2015-05-31 IMAGING — CR DG CHEST 1V PORT
1 series · 1 of 1 positions shown · non-contrast
Comparison: 04/23/2012

CLINICAL DATA: COPD. Bloody bowel movements. Hypertension. Asthma.

EXAM:
PORTABLE CHEST - 1 VIEW

[AP]
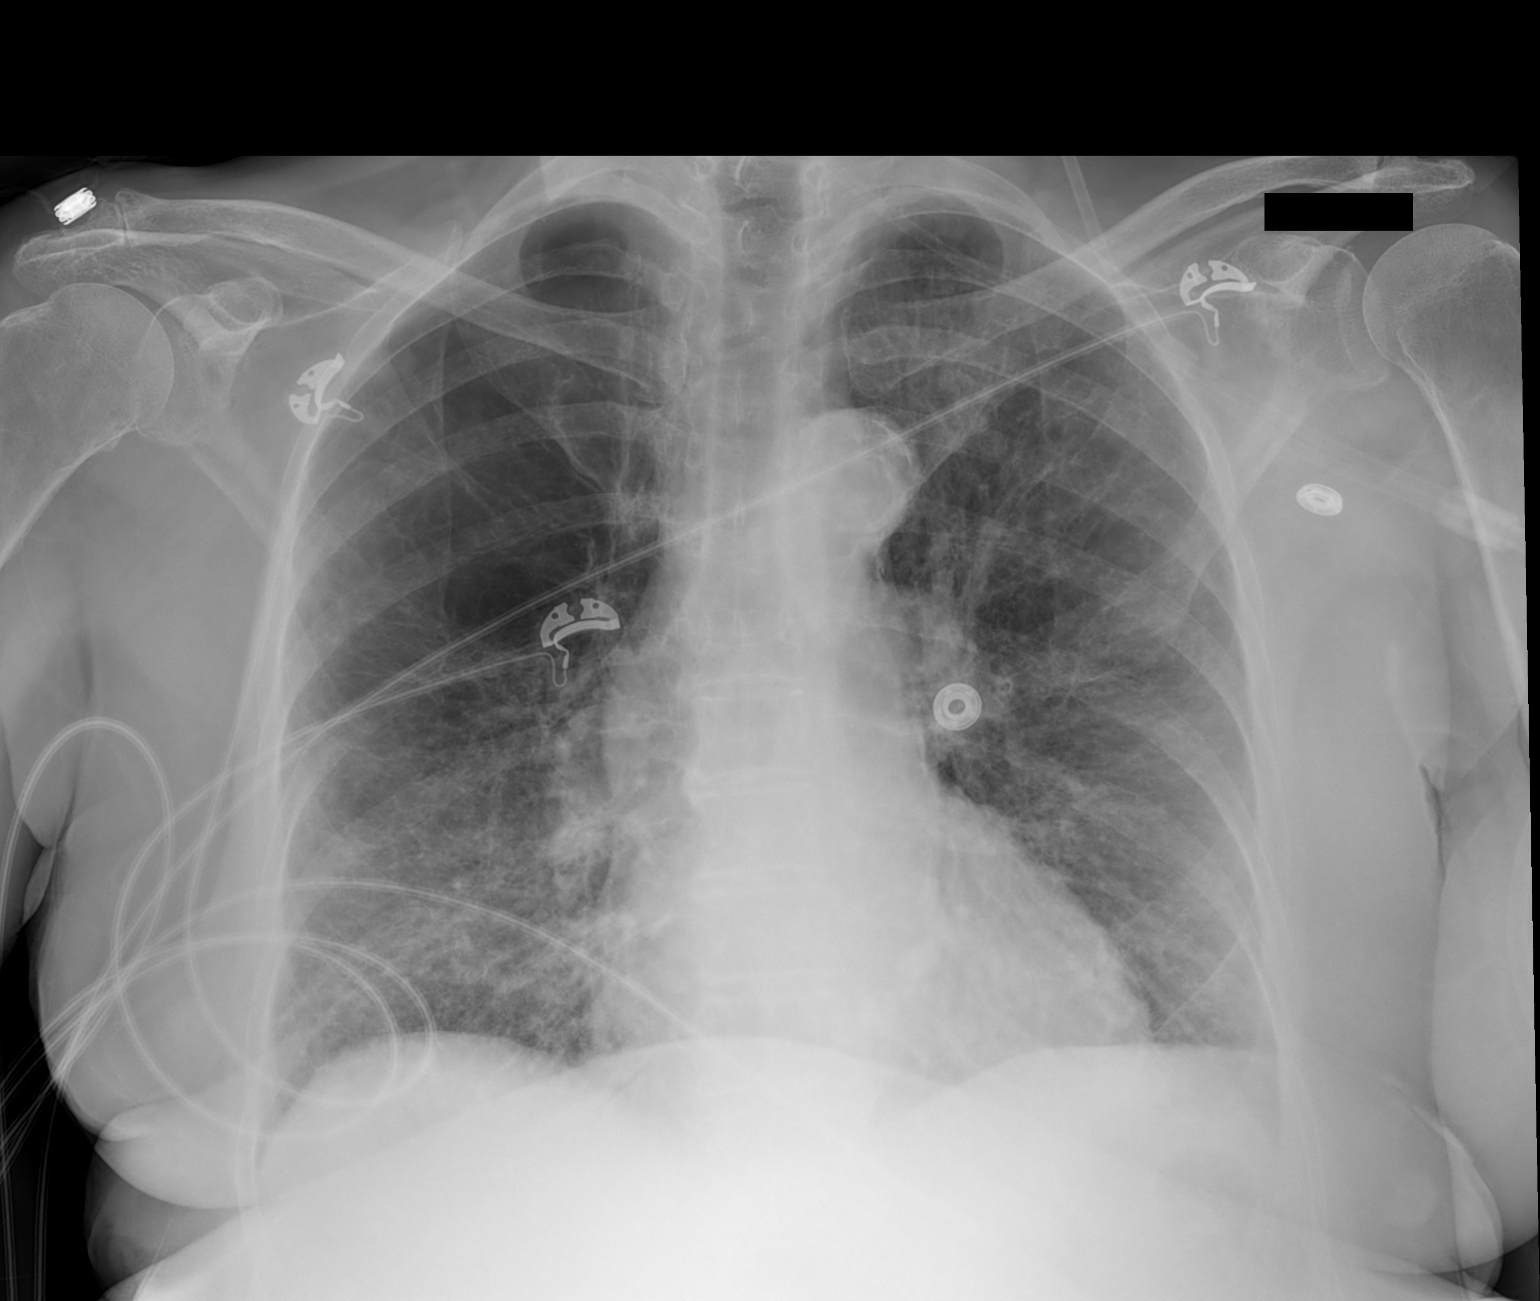

[1 of 1 positions shown; findings below may reference images not displayed]

FINDINGS: Numerous leads and wires project over the chest. Midline trachea.
Normal heart size with a tortuous, atherosclerotic aorta. No pleural
effusion or pneumothorax. Bullous disease at the apices, greater on
the right. Lower lobe predominant interstitial thickening which is
moderate and not significantly changed. No lobar consolidation. No
congestive failure.
IMPRESSION: COPD/chronic bronchitis, without superimposed acute process.

## 2015-06-16 ENCOUNTER — Other Ambulatory Visit: Payer: Self-pay | Admitting: Hematology

## 2015-06-16 ENCOUNTER — Telehealth: Payer: Self-pay | Admitting: *Deleted

## 2015-06-16 ENCOUNTER — Telehealth: Payer: Self-pay | Admitting: Hematology

## 2015-06-16 ENCOUNTER — Telehealth: Payer: Self-pay | Admitting: Oncology

## 2015-06-16 DIAGNOSIS — D509 Iron deficiency anemia, unspecified: Secondary | ICD-10-CM

## 2015-06-16 NOTE — Telephone Encounter (Signed)
Called and left a message with iron appointments °

## 2015-06-16 NOTE — Telephone Encounter (Signed)
Melissa, niece, called and wanted Dr. Burr Medico to know that patient's ferritin level dropped from 169 to 82. This lab was performed outside of [facility]. Labs to be faxed to Dr. Burr Medico. Melissa stated,"usually she gets an iron transfusion the same day I call. When will Dr. Burr Medico call today?" Informed her that Dr. Burr Medico is in clinic and I will send her and her nurse the message. Melissa's return number is [phone].

## 2015-06-16 NOTE — Telephone Encounter (Signed)
Patient niece called to reschedule iron inf for 10/4 and 10/11. Per niece patient cannot come these date/times. Gave niece new appointments for 10/6 and 10/13.

## 2015-06-16 NOTE — Telephone Encounter (Signed)
I called back, and sent POF for feraheme infusion X2 in the next 2 weeks  Truitt Merle  06/16/2015

## 2015-06-20 ENCOUNTER — Ambulatory Visit: Payer: Medicare Other

## 2015-06-20 ENCOUNTER — Other Ambulatory Visit: Payer: Medicare Other

## 2015-06-21 ENCOUNTER — Other Ambulatory Visit: Payer: Self-pay | Admitting: Emergency Medicine

## 2015-06-22 ENCOUNTER — Other Ambulatory Visit (HOSPITAL_BASED_OUTPATIENT_CLINIC_OR_DEPARTMENT_OTHER): Payer: Medicare Other

## 2015-06-22 ENCOUNTER — Other Ambulatory Visit: Payer: Self-pay | Admitting: Hematology

## 2015-06-22 ENCOUNTER — Ambulatory Visit (HOSPITAL_BASED_OUTPATIENT_CLINIC_OR_DEPARTMENT_OTHER): Payer: Medicare Other

## 2015-06-22 VITALS — BP 132/56 | HR 73 | Temp 97.8°F | Resp 18

## 2015-06-22 DIAGNOSIS — D509 Iron deficiency anemia, unspecified: Secondary | ICD-10-CM

## 2015-06-22 DIAGNOSIS — K5521 Angiodysplasia of colon with hemorrhage: Secondary | ICD-10-CM

## 2015-06-22 LAB — CBC WITH DIFFERENTIAL/PLATELET
BASO%: 0.7 % (ref 0.0–2.0)
BASOS ABS: 0.1 10*3/uL (ref 0.0–0.1)
EOS%: 3.5 % (ref 0.0–7.0)
Eosinophils Absolute: 0.3 10*3/uL (ref 0.0–0.5)
HEMATOCRIT: 40.5 % (ref 34.8–46.6)
HEMOGLOBIN: 12.5 g/dL (ref 11.6–15.9)
LYMPH#: 1.9 10*3/uL (ref 0.9–3.3)
LYMPH%: 21.8 % (ref 14.0–49.7)
MCH: 30 pg (ref 25.1–34.0)
MCHC: 30.9 g/dL — ABNORMAL LOW (ref 31.5–36.0)
MCV: 97.4 fL (ref 79.5–101.0)
MONO#: 0.9 10*3/uL (ref 0.1–0.9)
MONO%: 10.5 % (ref 0.0–14.0)
NEUT#: 5.4 10*3/uL (ref 1.5–6.5)
NEUT%: 63.5 % (ref 38.4–76.8)
Platelets: 222 10*3/uL (ref 145–400)
RBC: 4.16 10*6/uL (ref 3.70–5.45)
RDW: 13.7 % (ref 11.2–14.5)
WBC: 8.5 10*3/uL (ref 3.9–10.3)

## 2015-06-22 LAB — FERRITIN CHCC: FERRITIN: 50 ng/mL (ref 9–269)

## 2015-06-22 MED ORDER — SODIUM CHLORIDE 0.9 % IV SOLN
510.0000 mg | Freq: Once | INTRAVENOUS | Status: AC
  Administered 2015-06-22: 510 mg via INTRAVENOUS
  Filled 2015-06-22: qty 17

## 2015-06-22 NOTE — Patient Instructions (Signed)

## 2015-06-26 ENCOUNTER — Telehealth: Payer: Self-pay | Admitting: Hematology

## 2015-06-26 NOTE — Telephone Encounter (Signed)
Returned call to patient's driver Melissa Amish re r/s 11/9 appointments due to another appointment conflict. Appointments moved from 11/9 to 11/7. Other appointments remain the same. Melissa has new date/time.

## 2015-06-27 ENCOUNTER — Ambulatory Visit: Payer: Medicare Other

## 2015-06-27 ENCOUNTER — Other Ambulatory Visit: Payer: Self-pay | Admitting: Hematology

## 2015-06-29 ENCOUNTER — Ambulatory Visit (HOSPITAL_BASED_OUTPATIENT_CLINIC_OR_DEPARTMENT_OTHER): Payer: Medicare Other

## 2015-06-29 VITALS — BP 120/60 | HR 71 | Temp 97.7°F | Resp 18

## 2015-06-29 DIAGNOSIS — D62 Acute posthemorrhagic anemia: Secondary | ICD-10-CM

## 2015-06-29 DIAGNOSIS — D509 Iron deficiency anemia, unspecified: Secondary | ICD-10-CM

## 2015-06-29 DIAGNOSIS — K5521 Angiodysplasia of colon with hemorrhage: Secondary | ICD-10-CM

## 2015-06-29 MED ORDER — ALTEPLASE 2 MG IJ SOLR
2.0000 mg | Freq: Once | INTRAMUSCULAR | Status: DC | PRN
  Filled 2015-06-29: qty 2

## 2015-06-29 MED ORDER — SODIUM CHLORIDE 0.9 % IJ SOLN
10.0000 mL | INTRAMUSCULAR | Status: DC | PRN
  Filled 2015-06-29: qty 10

## 2015-06-29 MED ORDER — HEPARIN SOD (PORK) LOCK FLUSH 100 UNIT/ML IV SOLN
250.0000 [IU] | Freq: Once | INTRAVENOUS | Status: DC | PRN
  Filled 2015-06-29: qty 5

## 2015-06-29 MED ORDER — HEPARIN SOD (PORK) LOCK FLUSH 100 UNIT/ML IV SOLN
500.0000 [IU] | Freq: Once | INTRAVENOUS | Status: DC | PRN
  Filled 2015-06-29: qty 5

## 2015-06-29 MED ORDER — SODIUM CHLORIDE 0.9 % IV SOLN
510.0000 mg | Freq: Once | INTRAVENOUS | Status: AC
  Administered 2015-06-29: 510 mg via INTRAVENOUS
  Filled 2015-06-29: qty 17

## 2015-07-04 ENCOUNTER — Ambulatory Visit (INDEPENDENT_AMBULATORY_CARE_PROVIDER_SITE_OTHER): Payer: Medicare Other | Admitting: Emergency Medicine

## 2015-07-04 ENCOUNTER — Other Ambulatory Visit: Payer: Medicare Other

## 2015-07-04 ENCOUNTER — Encounter: Payer: Self-pay | Admitting: Emergency Medicine

## 2015-07-04 ENCOUNTER — Ambulatory Visit (INDEPENDENT_AMBULATORY_CARE_PROVIDER_SITE_OTHER)
Admission: RE | Admit: 2015-07-04 | Discharge: 2015-07-04 | Disposition: A | Discharge status: Home / Self Care | Payer: Medicare Other | Source: Ambulatory Visit | Attending: Emergency Medicine | Admitting: Emergency Medicine

## 2015-07-04 VITALS — BP 108/68 | HR 84 | Ht 66.0 in | Wt 167.6 lb

## 2015-07-04 DIAGNOSIS — J45901 Unspecified asthma with (acute) exacerbation: Secondary | ICD-10-CM | POA: Diagnosis not present

## 2015-07-04 DIAGNOSIS — J441 Chronic obstructive pulmonary disease with (acute) exacerbation: Secondary | ICD-10-CM

## 2015-07-04 MED ORDER — AZITHROMYCIN 250 MG PO TABS: 250.0000 mg | ORAL_TABLET | Freq: Every day | ORAL | Status: DC

## 2015-07-04 MED ORDER — PREDNISONE 10 MG PO TABS: 10.0000 mg | ORAL_TABLET | Freq: Every day | ORAL | Status: DC

## 2015-07-04 NOTE — Assessment & Plan Note (Signed)
Minimal wheeze and no significant sputum production or cough but I do suspect that her baseline obstruction is worse likely due to allergy season. She has almost no reserve and I believe she needs to be treated aggressively. We will continue her current inhaler regimen, start prednisone, azithromycin and follow up in 1 month. She will call if she is not improving

## 2015-07-04 NOTE — Progress Notes (Signed)
Subjective:    Patient ID: [name], female    DOB: [birth year], 81 y.o.   MRN: [id number]  HPI  81 yo Severe COPD, HTN + diastolic dysfxn, hypoxemia  ROV 07/20/14 -- follow up visit for Severe COPD, HTN + diastolic dysfxn, hypoxemia, anemia. She has been doing well since last visit. She has been wearing O2 at night and in the day prn. She is on spiriva and symbicort, but has received info that says her formulary is changing Spiriva to Incruse. She needs new script for all of her inhalers. No exacerbations since last time. Uses SABA about once every day.   ROV 09/20/14 -- f/u for severe COPD. She has been doing well - no flares, no pred, no abx. Has not required SABA very often.  Follow up 12/02/14 Returns for follow up for AECOPD  tx last week with levaquin and steroids .  She si starting to feel better.  Cough is less. No fever, chest pain, orthopnea, edema or hemoptysis.   ROV 01/12/15 -- follow-up visit for severe COPD. She was seen by Dr. Gwenette Greet and then by TP for acute exacerbation of her COPD. She was treated with levofloxacin and corticosteroids with improvement. She was treated before that with other abx prior to the levaquin. She is on symbicort and incruse. She uses SABA a few times a week. No real cough or wheeze since her AE. She is taking zyrtec.   ROV 04/14/15 -- hx of severe COPD, FEV1 1.5L, 69% pred in 2010. She feels that she is doing well. She is now on Incruse and Symbicort. She is having nasal congestion,  hoarse voice. No wheeze or cough. No flares since last time. She uses O2 with exertion, checks her SpO2 regularly. She is on zyrtec qd. She uses flonase prn, about once a week.    Acute OV 07/04/15 -- history of severe COPD chronic cough and rhinitis. Here for an acute visit. She began to notice chest tightness, more dyspnea. Has had wheezing. No cough. No viral prodrome. She was seen by PCP last week, may have had bibasilar crackles. Has used albuterol 2-3 times.  Activity level down. Currently on Incruse and Symbicort.    Review of Systems As per HPI   CAT Score 03/04/2013 09/17/2012 08/03/2012  Total CAT Score 16 9 12         Objective:   Physical Exam  Filed Vitals:   07/04/15 1615  BP: 108/68  Pulse: 84  Height: 5\' 6"  (1.676 m)  Weight: 167 lb 9.6 oz (76.023 kg)  SpO2: 97%    GEN: A/Ox3; pleasant , NAD, elderly   HEENT:  [place]/AT,  EACs-clear, TMs-wnl, NOSE-clear, THROAT-clear, no lesions, no postnasal drip or exudate noted.   NECK:  Supple w/ fair ROM; no JVD; normal carotid impulses w/o bruits; no thyromegaly or nodules palpated; no lymphadenopathy.  RESP  Decreased BS, very distant, no wheeze.   CARD:  RRR, no m/r/g  , no peripheral edema, pulses intact, no cyanosis or clubbing.  Musco: Warm bil, no deformities or joint swelling noted.   Neuro: alert, no focal deficits noted.    Skin: Warm, no lesions or rashes      Assessment & Plan:  Acute exacerbation of COPD with asthma ([place]) Minimal wheeze and no significant sputum production or cough but I do suspect that her baseline obstruction is worse likely due to allergy season. She has almost no reserve and I believe she needs to be treated aggressively. We  will continue her current inhaler regimen, start prednisone, azithromycin and follow up in 1 month. She will call if she is not improving

## 2015-07-04 NOTE — Patient Instructions (Signed)
Please take prednisone 40mg  daily for 3 days, then 30mg  daily for 3 days, then 20mg  daily for 3 days, then 10mg  daily for 3 days, then stop Take azithromycin as directed to completion  Continue your Incruse and Symbicort as you are taking them .  Follow with Dr Lamonte Sakai in 1 month

## 2015-07-05 ENCOUNTER — Telehealth: Payer: Self-pay | Admitting: Emergency Medicine

## 2015-07-05 DIAGNOSIS — J45901 Unspecified asthma with (acute) exacerbation: Principal | ICD-10-CM

## 2015-07-05 DIAGNOSIS — J441 Chronic obstructive pulmonary disease with (acute) exacerbation: Secondary | ICD-10-CM

## 2015-07-05 MED ORDER — PREDNISONE 10 MG PO TABS: ORAL_TABLET | ORAL | Status: DC

## 2015-07-05 NOTE — Telephone Encounter (Signed)
Spoke with pt's daughter, [name]. Her 1 month ROV has already been scheduled for 08/16/15 at 1:30pm. Nothing further was needed.

## 2015-07-05 NOTE — Telephone Encounter (Signed)
Spoke with pt's daughter. States that the pharmacy received the antitbiotic rx but not the prednisone. Looking back in the pt's chart, the prednisone rx was "printed" instead of being sent to the pharmacy. I apologized for any inconvenience and resent the rx to the pt's pharmacy. Nothing further was needed at this time.

## 2015-07-18 ENCOUNTER — Other Ambulatory Visit: Payer: Self-pay | Admitting: Hematology

## 2015-07-19 ENCOUNTER — Telehealth: Payer: Self-pay | Admitting: Hematology

## 2015-07-19 ENCOUNTER — Telehealth: Payer: Self-pay | Admitting: *Deleted

## 2015-07-19 NOTE — Telephone Encounter (Signed)
Called pt per Dr [name] request & informed that her feritin was 306 on 07/17/15 labs done at [facility] & she doesn't need fereheme.  Will cancel fereheme infusion for 11/7 & the following week.  She will keep appt in Jan for lab & MD & cont monthly labs at local PCP.  She expressed understanding.

## 2015-07-19 NOTE — Telephone Encounter (Signed)
per pof to r/s pt appts 2 moths out-sch and sent Mw email to move fera-will call pt after reply

## 2015-07-19 NOTE — Telephone Encounter (Signed)
Per staff message and POF I have scheduled appts. Advised scheduler of appts. JMW  

## 2015-07-24 ENCOUNTER — Ambulatory Visit: Payer: Medicare Other | Admitting: Hematology

## 2015-07-24 ENCOUNTER — Other Ambulatory Visit: Payer: Medicare Other

## 2015-07-24 ENCOUNTER — Ambulatory Visit: Payer: Medicare Other

## 2015-07-25 ENCOUNTER — Ambulatory Visit: Payer: Medicare Other | Admitting: Hematology

## 2015-07-25 ENCOUNTER — Other Ambulatory Visit: Payer: Medicare Other

## 2015-07-25 ENCOUNTER — Ambulatory Visit: Payer: Medicare Other

## 2015-07-26 ENCOUNTER — Ambulatory Visit: Payer: Medicare Other | Admitting: Hematology

## 2015-07-26 ENCOUNTER — Ambulatory Visit: Payer: Medicare Other

## 2015-07-26 ENCOUNTER — Other Ambulatory Visit: Payer: Medicare Other

## 2015-08-01 ENCOUNTER — Ambulatory Visit: Payer: Medicare Other

## 2015-08-16 ENCOUNTER — Encounter: Payer: Self-pay | Admitting: Emergency Medicine

## 2015-08-16 ENCOUNTER — Ambulatory Visit (INDEPENDENT_AMBULATORY_CARE_PROVIDER_SITE_OTHER): Payer: Medicare Other | Admitting: Emergency Medicine

## 2015-08-16 VITALS — BP 120/74 | HR 83 | Ht 66.5 in | Wt 170.0 lb

## 2015-08-16 DIAGNOSIS — J449 Chronic obstructive pulmonary disease, unspecified: Secondary | ICD-10-CM

## 2015-08-16 NOTE — Progress Notes (Signed)
Subjective:    Patient ID: [name], female    DOB: 20-Apr-1933, 81 y.o.   MRN: [id number]  HPI  81 yo Severe COPD, HTN + diastolic dysfxn, hypoxemia  ROV 07/20/14 -- follow up visit for Severe COPD, HTN + diastolic dysfxn, hypoxemia, anemia. She has been doing well since last visit. She has been wearing O2 at night and in the day prn. She is on spiriva and symbicort, but has received info that says her formulary is changing Spiriva to Incruse. She needs new script for all of her inhalers. No exacerbations since last time. Uses SABA about once every day.   ROV 09/20/14 -- f/u for severe COPD. She has been doing well - no flares, no pred, no abx. Has not required SABA very often.  Follow up 12/02/14 Returns for follow up for AECOPD  tx last week with levaquin and steroids .  She si starting to feel better.  Cough is less. No fever, chest pain, orthopnea, edema or hemoptysis.   ROV 01/12/15 -- follow-up visit for severe COPD. She was seen by Dr. Gwenette Greet and then by TP for acute exacerbation of her COPD. She was treated with levofloxacin and corticosteroids with improvement. She was treated before that with other abx prior to the levaquin. She is on symbicort and incruse. She uses SABA a few times a week. No real cough or wheeze since her AE. She is taking zyrtec.   ROV 04/14/15 -- hx of severe COPD, FEV1 1.5L, 69% pred in 2010. She feels that she is doing well. She is now on Incruse and Symbicort. She is having nasal congestion,  hoarse voice. No wheeze or cough. No flares since last time. She uses O2 with exertion, checks her SpO2 regularly. She is on zyrtec qd. She uses flonase prn, about once a week.    Acute OV 07/04/15 -- history of severe COPD chronic cough and rhinitis. Here for an acute visit. She began to notice chest tightness, more dyspnea. Has had wheezing. No cough. No viral prodrome. She was seen by PCP last week, may have had bibasilar crackles. Has used albuterol 2-3 times.  Activity level down. Currently on Incruse and Symbicort.  ROV 08/16/15 -- follow-up visit for severe COPD and a history of allergic rhinitis. I saw her one month ago at which time she was experiencing exacerbation characterized by diffuse wheezing, some mild bibasilar crackles, dyspnea, increased albuterol use.  She was treated with prednisone and azithromycin.  She was able to take the meds and they helped. Her breathing is back to baseline.  She is using O2 at night, with some exertion. No wheeze or cough currently. On incruse, symbicort. Uses albuterol about once a week - back to normal.    CAT Score 03/04/2013 09/17/2012 08/03/2012  Total CAT Score 16 9 12      Review of Systems As per HPI   CAT Score 03/04/2013 09/17/2012 08/03/2012  Total CAT Score 16 9 12         Objective:   Physical Exam  Filed Vitals:   08/16/15 1341  BP: 120/74  Pulse: 83  Height: 5' 6.5" (1.689 m)  Weight: 170 lb (77.111 kg)  SpO2: 91%    GEN: A/Ox3; pleasant , NAD, elderly   NECK:  No LAd or stridor  RESP  Decreased BS, very distant, no wheeze today - much improved.   CARD:  RRR, no m/r/g  , no peripheral edema, pulses intact, no cyanosis or clubbing.  Musco: Warm  bil, no deformities or joint swelling noted.   Neuro: alert, no focal deficits noted.    Skin: Warm, no lesions or rashes      Assessment & Plan:  COPD (chronic obstructive pulmonary disease) With recent acute exacerbation. She has improved significantly with prednisone and azithromycin. We will continue Symbicort and Incruse, refill these for her. Follow in 4 months or when necessary

## 2015-08-16 NOTE — Assessment & Plan Note (Signed)
With recent acute exacerbation. She has improved significantly with prednisone and azithromycin. We will continue Symbicort and Incruse, refill these for her. Follow in 4 months or when necessary

## 2015-08-16 NOTE — Patient Instructions (Signed)
Please continue your Incruse and Symbicort  Take albuterol 2 puffs up to every 4 hours if needed for shortness of breath.  Use your oxygen with heavy exertion, goal is saturation > 90%.  Follow with Dr Lamonte Sakai in 4 months or sooner if you have any problems.

## 2015-09-13 ENCOUNTER — Encounter: Payer: Self-pay | Admitting: Medical Oncology

## 2015-09-13 ENCOUNTER — Observation Stay
Admission: EM | Admit: 2015-09-13 | Discharge: 2015-09-15 | Disposition: A | Discharge status: Home / Self Care | Payer: Medicare Other | Attending: Internal Medicine | Admitting: Internal Medicine

## 2015-09-13 DIAGNOSIS — I517 Cardiomegaly: Secondary | ICD-10-CM | POA: Diagnosis not present

## 2015-09-13 DIAGNOSIS — Z8249 Family history of ischemic heart disease and other diseases of the circulatory system: Secondary | ICD-10-CM | POA: Insufficient documentation

## 2015-09-13 DIAGNOSIS — Z8673 Personal history of transient ischemic attack (TIA), and cerebral infarction without residual deficits: Secondary | ICD-10-CM | POA: Diagnosis not present

## 2015-09-13 DIAGNOSIS — E890 Postprocedural hypothyroidism: Secondary | ICD-10-CM | POA: Diagnosis not present

## 2015-09-13 DIAGNOSIS — I10 Essential (primary) hypertension: Secondary | ICD-10-CM | POA: Insufficient documentation

## 2015-09-13 DIAGNOSIS — Z825 Family history of asthma and other chronic lower respiratory diseases: Secondary | ICD-10-CM | POA: Insufficient documentation

## 2015-09-13 DIAGNOSIS — I5033 Acute on chronic diastolic (congestive) heart failure: Secondary | ICD-10-CM | POA: Diagnosis not present

## 2015-09-13 DIAGNOSIS — R0600 Dyspnea, unspecified: Secondary | ICD-10-CM | POA: Diagnosis not present

## 2015-09-13 DIAGNOSIS — R000 Tachycardia, unspecified: Secondary | ICD-10-CM | POA: Diagnosis not present

## 2015-09-13 DIAGNOSIS — J441 Chronic obstructive pulmonary disease with (acute) exacerbation: Secondary | ICD-10-CM | POA: Diagnosis not present

## 2015-09-13 DIAGNOSIS — Z853 Personal history of malignant neoplasm of breast: Secondary | ICD-10-CM | POA: Diagnosis not present

## 2015-09-13 DIAGNOSIS — R079 Chest pain, unspecified: Secondary | ICD-10-CM | POA: Insufficient documentation

## 2015-09-13 DIAGNOSIS — Z7982 Long term (current) use of aspirin: Secondary | ICD-10-CM | POA: Insufficient documentation

## 2015-09-13 DIAGNOSIS — I2510 Atherosclerotic heart disease of native coronary artery without angina pectoris: Secondary | ICD-10-CM | POA: Insufficient documentation

## 2015-09-13 DIAGNOSIS — Z823 Family history of stroke: Secondary | ICD-10-CM | POA: Insufficient documentation

## 2015-09-13 DIAGNOSIS — K219 Gastro-esophageal reflux disease without esophagitis: Secondary | ICD-10-CM | POA: Diagnosis not present

## 2015-09-13 DIAGNOSIS — Z7951 Long term (current) use of inhaled steroids: Secondary | ICD-10-CM | POA: Diagnosis not present

## 2015-09-13 DIAGNOSIS — Z91040 Latex allergy status: Secondary | ICD-10-CM | POA: Diagnosis not present

## 2015-09-13 DIAGNOSIS — K921 Melena: Secondary | ICD-10-CM | POA: Diagnosis not present

## 2015-09-13 DIAGNOSIS — Z9981 Dependence on supplemental oxygen: Secondary | ICD-10-CM | POA: Diagnosis not present

## 2015-09-13 DIAGNOSIS — E876 Hypokalemia: Secondary | ICD-10-CM | POA: Insufficient documentation

## 2015-09-13 DIAGNOSIS — Z87891 Personal history of nicotine dependence: Secondary | ICD-10-CM | POA: Insufficient documentation

## 2015-09-13 DIAGNOSIS — R9431 Abnormal electrocardiogram [ECG] [EKG]: Secondary | ICD-10-CM | POA: Insufficient documentation

## 2015-09-13 DIAGNOSIS — D649 Anemia, unspecified: Secondary | ICD-10-CM | POA: Diagnosis present

## 2015-09-13 DIAGNOSIS — K922 Gastrointestinal hemorrhage, unspecified: Secondary | ICD-10-CM

## 2015-09-13 DIAGNOSIS — D509 Iron deficiency anemia, unspecified: Secondary | ICD-10-CM | POA: Diagnosis not present

## 2015-09-13 DIAGNOSIS — J309 Allergic rhinitis, unspecified: Secondary | ICD-10-CM | POA: Diagnosis not present

## 2015-09-13 DIAGNOSIS — I272 Other secondary pulmonary hypertension: Secondary | ICD-10-CM | POA: Diagnosis not present

## 2015-09-13 DIAGNOSIS — R011 Cardiac murmur, unspecified: Secondary | ICD-10-CM | POA: Diagnosis not present

## 2015-09-13 DIAGNOSIS — K449 Diaphragmatic hernia without obstruction or gangrene: Secondary | ICD-10-CM | POA: Insufficient documentation

## 2015-09-13 DIAGNOSIS — K5521 Angiodysplasia of colon with hemorrhage: Secondary | ICD-10-CM | POA: Insufficient documentation

## 2015-09-13 DIAGNOSIS — D62 Acute posthemorrhagic anemia: Secondary | ICD-10-CM | POA: Insufficient documentation

## 2015-09-13 DIAGNOSIS — I739 Peripheral vascular disease, unspecified: Secondary | ICD-10-CM | POA: Insufficient documentation

## 2015-09-13 DIAGNOSIS — Z79899 Other long term (current) drug therapy: Secondary | ICD-10-CM | POA: Diagnosis not present

## 2015-09-13 DIAGNOSIS — I421 Obstructive hypertrophic cardiomyopathy: Secondary | ICD-10-CM | POA: Diagnosis not present

## 2015-09-13 DIAGNOSIS — E785 Hyperlipidemia, unspecified: Secondary | ICD-10-CM | POA: Insufficient documentation

## 2015-09-13 DIAGNOSIS — Z800 Family history of malignant neoplasm of digestive organs: Secondary | ICD-10-CM | POA: Insufficient documentation

## 2015-09-13 LAB — COMPREHENSIVE METABOLIC PANEL
ALK PHOS: 86 U/L (ref 38–126)
ALT: 10 U/L — ABNORMAL LOW (ref 14–54)
ANION GAP: 9 (ref 5–15)
AST: 14 U/L — ABNORMAL LOW (ref 15–41)
Albumin: 4 g/dL (ref 3.5–5.0)
BILIRUBIN TOTAL: 0.4 mg/dL (ref 0.3–1.2)
BUN: 22 mg/dL — ABNORMAL HIGH (ref 6–20)
CALCIUM: 8.9 mg/dL (ref 8.9–10.3)
CO2: 30 mmol/L (ref 22–32)
Chloride: 101 mmol/L (ref 101–111)
Creatinine, Ser: 0.78 mg/dL (ref 0.44–1.00)
GFR calc non Af Amer: >60 mL/min (ref >60)
Glucose, Bld: 106 mg/dL — ABNORMAL HIGH (ref 65–99)
Potassium: 3.3 mmol/L — ABNORMAL LOW (ref 3.5–5.1)
Sodium: 140 mmol/L (ref 135–145)
TOTAL PROTEIN: 7.1 g/dL (ref 6.5–8.1)

## 2015-09-13 LAB — CBC
HCT: 26.7 % — ABNORMAL LOW (ref 35.0–47.0)
HEMOGLOBIN: 8.4 g/dL — AB (ref 12.0–16.0)
MCH: 29.5 pg (ref 26.0–34.0)
MCHC: 31.5 g/dL — AB (ref 32.0–36.0)
MCV: 93.5 fL (ref 80.0–100.0)
Platelets: 291 10*3/uL (ref 150–440)
RBC: 2.85 MIL/uL — AB (ref 3.80–5.20)
RDW: 14.2 % (ref 11.5–14.5)
WBC: 11.6 10*3/uL — ABNORMAL HIGH (ref 3.6–11.0)

## 2015-09-13 LAB — FERRITIN: Ferritin: 18 ng/mL (ref 11–307)

## 2015-09-13 LAB — ABO/RH: ABO/RH(D): O POS

## 2015-09-13 LAB — PREPARE RBC (CROSSMATCH)

## 2015-09-13 MED ORDER — TIOTROPIUM BROMIDE MONOHYDRATE 18 MCG IN CAPS
18.0000 ug | ORAL_CAPSULE | Freq: Every day | RESPIRATORY_TRACT | Status: DC
Start: 2015-09-13 — End: 2015-09-15
  Administered 2015-09-13 – 2015-09-14 (×2): 18 ug via RESPIRATORY_TRACT
  Filled 2015-09-13: qty 5

## 2015-09-13 MED ORDER — ESCITALOPRAM OXALATE 10 MG PO TABS
20.0000 mg | ORAL_TABLET | Freq: Every day | ORAL | Status: DC
  Administered 2015-09-14 – 2015-09-15 (×2): 20 mg via ORAL
  Filled 2015-09-13 (×2): qty 2

## 2015-09-13 MED ORDER — BUDESONIDE-FORMOTEROL FUMARATE 160-4.5 MCG/ACT IN AERO
2.0000 | INHALATION_SPRAY | Freq: Two times a day (BID) | RESPIRATORY_TRACT | Status: DC
Start: 2015-09-13 — End: 2015-09-15
  Administered 2015-09-13 – 2015-09-15 (×4): 2 via RESPIRATORY_TRACT
  Filled 2015-09-13: qty 6

## 2015-09-13 MED ORDER — FLUTICASONE PROPIONATE 50 MCG/ACT NA SUSP
2.0000 | Freq: Every day | NASAL | Status: DC | PRN
  Filled 2015-09-13: qty 16

## 2015-09-13 MED ORDER — POTASSIUM CHLORIDE CRYS ER 20 MEQ PO TBCR
40.0000 meq | EXTENDED_RELEASE_TABLET | Freq: Once | ORAL | Status: AC
  Administered 2015-09-13: 40 meq via ORAL
  Filled 2015-09-13: qty 2

## 2015-09-13 MED ORDER — ALBUTEROL SULFATE (2.5 MG/3ML) 0.083% IN NEBU: 2.5000 mg | INHALATION_SOLUTION | Freq: Four times a day (QID) | RESPIRATORY_TRACT | Status: DC | PRN

## 2015-09-13 MED ORDER — SODIUM CHLORIDE 0.9 % IV SOLN
200.0000 mg | Freq: Once | INTRAVENOUS | Status: AC
  Administered 2015-09-14: 200 mg via INTRAVENOUS
  Filled 2015-09-13: qty 10

## 2015-09-13 MED ORDER — VITAMIN D 1000 UNITS PO TABS
1000.0000 [IU] | ORAL_TABLET | Freq: Every day | ORAL | Status: DC
  Administered 2015-09-14 – 2015-09-15 (×2): 1000 [IU] via ORAL
  Filled 2015-09-13 (×2): qty 1

## 2015-09-13 MED ORDER — ACETAMINOPHEN 650 MG RE SUPP: 650.0000 mg | Freq: Four times a day (QID) | RECTAL | Status: DC | PRN

## 2015-09-13 MED ORDER — ADULT MULTIVITAMIN W/MINERALS CH
1.0000 | ORAL_TABLET | Freq: Every day | ORAL | Status: DC
  Administered 2015-09-14 – 2015-09-15 (×2): 1 via ORAL
  Filled 2015-09-13 (×2): qty 1

## 2015-09-13 MED ORDER — FUROSEMIDE 10 MG/ML IJ SOLN
20.0000 mg | Freq: Once | INTRAMUSCULAR | Status: AC
  Administered 2015-09-13: 20 mg via INTRAVENOUS
  Filled 2015-09-13: qty 2

## 2015-09-13 MED ORDER — CALCIUM CARBONATE 1500 (600 CA) MG PO TABS
600.0000 mg | ORAL_TABLET | Freq: Every day | ORAL | Status: DC
  Filled 2015-09-13 (×2): qty 1

## 2015-09-13 MED ORDER — ALPRAZOLAM 0.25 MG PO TABS
0.2500 mg | ORAL_TABLET | Freq: Every evening | ORAL | Status: DC | PRN
  Administered 2015-09-13 – 2015-09-14 (×2): 0.5 mg via ORAL
  Filled 2015-09-13 (×2): qty 2

## 2015-09-13 MED ORDER — FUROSEMIDE 10 MG/ML IJ SOLN
40.0000 mg | Freq: Once | INTRAMUSCULAR | Status: AC
  Administered 2015-09-13: 40 mg via INTRAVENOUS
  Filled 2015-09-13: qty 4

## 2015-09-13 MED ORDER — UMECLIDINIUM BROMIDE 62.5 MCG/INH IN AEPB: 1.0000 | INHALATION_SPRAY | Freq: Every day | RESPIRATORY_TRACT | Status: DC

## 2015-09-13 MED ORDER — LORATADINE 10 MG PO TABS
10.0000 mg | ORAL_TABLET | Freq: Every day | ORAL | Status: DC
  Administered 2015-09-14 – 2015-09-15 (×2): 10 mg via ORAL
  Filled 2015-09-13 (×2): qty 1

## 2015-09-13 MED ORDER — ATORVASTATIN CALCIUM 20 MG PO TABS
20.0000 mg | ORAL_TABLET | Freq: Every day | ORAL | Status: DC
  Administered 2015-09-14 – 2015-09-15 (×2): 20 mg via ORAL
  Filled 2015-09-13 (×2): qty 1

## 2015-09-13 MED ORDER — LEVOTHYROXINE SODIUM 150 MCG PO TABS
150.0000 ug | ORAL_TABLET | Freq: Every day | ORAL | Status: DC
  Administered 2015-09-14 – 2015-09-15 (×2): 150 ug via ORAL
  Filled 2015-09-13 (×2): qty 1

## 2015-09-13 MED ORDER — POLYETHYLENE GLYCOL 3350 17 G PO PACK: 17.0000 g | PACK | Freq: Every day | ORAL | Status: DC | PRN

## 2015-09-13 MED ORDER — SODIUM CHLORIDE 0.9 % IV SOLN
10.0000 mL/h | Freq: Once | INTRAVENOUS | Status: AC
  Administered 2015-09-13: 10 mL/h via INTRAVENOUS

## 2015-09-13 MED ORDER — FUROSEMIDE 20 MG PO TABS
20.0000 mg | ORAL_TABLET | Freq: Every day | ORAL | Status: DC
  Administered 2015-09-14 – 2015-09-15 (×2): 20 mg via ORAL
  Filled 2015-09-13 (×2): qty 1

## 2015-09-13 MED ORDER — ACETAMINOPHEN 325 MG PO TABS: 650.0000 mg | ORAL_TABLET | Freq: Four times a day (QID) | ORAL | Status: DC | PRN

## 2015-09-13 MED ORDER — DEXTROMETHORPHAN POLISTIREX ER 30 MG/5ML PO SUER
30.0000 mg | Freq: Every evening | ORAL | Status: DC | PRN
  Filled 2015-09-13: qty 5

## 2015-09-13 MED ORDER — SODIUM CHLORIDE 0.9 % IJ SOLN
3.0000 mL | Freq: Two times a day (BID) | INTRAMUSCULAR | Status: DC
  Administered 2015-09-13 – 2015-09-15 (×4): 3 mL via INTRAVENOUS

## 2015-09-13 NOTE — ED Notes (Addendum)
Pt presents with increasing dizziness and weakness for three days. Pt states last bowel movement on 09/09/15 which was diarrhea. Pt states she saw blood in stool. Pt reports history of GI bleeds and blood transfusions. Pt denies nausea and vomiting.

## 2015-09-13 NOTE — ED Notes (Signed)
Dr. Burlene Arnt in room to discuss the benefits and side effects of blood transfusion. Pt verbalized understanding of risk and benefits. Pt signed consent form. This RN witnessed signature.

## 2015-09-13 NOTE — ED Provider Notes (Signed)
[facility] Emergency Department Provider Note  ____________________________________________   I have reviewed the triage vital signs and the nursing notes.   HISTORY  Chief Complaint GI Bleeding and abnormal lab     HPI [name] is a 81 y.o. female this is a complaining of a GI bleed. Patient has a history of AV malformation COPD CHF heart murmur O2 dependence and multiple different blood transfusions for GI bleeds. Patient had watery stool on Christmas Eve and noticed there was blood in her Foley paper and since that time she has been feeling weak and lightheaded consistent with her prior anemia. Her baseline hemoglobin is 12.5 and she does get ferritin injections. Patient has not had any chest pain and to be she denies shortness of breath over her baseline.She denies focal numbness or weakness. She denies abdominal pain and she does not believe she has had a bowel movement in the last 2 days.  Past Medical History  Diagnosis Date  . COPD (chronic obstructive pulmonary disease) ([place])   . Hypertrophic obstructive cardiomyopathy     Echo 10/10 EF 65-70% SW 1.6 PW 1.4 mild SAM no LVOT gradient at rest. Valsalva not performed Mild MR. No hyperenhance ment on MRI EF 69%.  . Coronary atherosclerosis of native coronary artery     Cath 1/11: RCA 50-60 ostial o/w normal. EF 70%.   . Pulmonary hypertension ([place])     Cath 1/11 RA 8, RV 43/6/14, PA 42/17 (29) PCWP 15 Fick  5.0/2.6. PVR 2.8    . Edema   . Occlusion and stenosis of carotid artery     s/p L CEA u/s 3/11. R 60-79%; L 40-59%. no change since 3/10  . Peripheral vascular disease, unspecified ([place])   . Hypertension   . Emphysema   . Asthma   . Hypothyroidism   . Allergic rhinitis   . Gastric AVM   . Heart murmur   . Anginal pain ([place])     "pressure"  . Emphysema   . Oxygen dependent 04/23/2012    "concentrated at night; liquid during day"  . Dyspnea 04/23/2012    "all the time"  . Anemia 04/23/2012     "severe @ times; think caused by colonic AVMs"  . History of blood transfusion     "many times"  . H/O hiatal hernia   . Stroke [facility]) ~ 2004    denies residual on 04/23/2012  . Arthritis     "probably"  . Breast cancer ([place])   . Pneumonia     Patient Active Problem List   Diagnosis Date Noted  . Acute exacerbation of COPD with asthma ([place]) 07/04/2015  . Iron deficiency anemia 12/03/2014  . Sepsis ([place]) 01/19/2014  . Acute renal failure ([place]) 01/19/2014  . Normocytic anemia 01/19/2014  . Elevated brain natriuretic peptide (BNP) level 01/19/2014  . Diastolic CHF, acute on chronic ([place]) 01/19/2014  . Fever 01/14/2014  . GI bleed 01/10/2014  . Acute blood loss anemia 01/10/2014  . Chest pain on exertion 04/24/2012  . CAD (coronary artery disease) 04/24/2012  . Microcytic anemia 04/24/2012  . Angiodysplasia of intestine with hemorrhage 04/24/2012  . Nonspecific abnormal finding in stool contents 04/24/2012  . Exertional angina ([place]) 04/23/2012  . GERD (gastroesophageal reflux disease) 04/03/2011  . HYPOTHYROIDISM 02/14/2011  . CAD, NATIVE VESSEL 10/02/2009  . PULMONARY HYPERTENSION, SECONDARY 10/02/2009  . Hypertrophic obstructive cardiomyopathy(425.11) 10/02/2009  . ABNORMAL ECHOCARDIOGRAM 07/06/2009  . Edema 02/09/2009  . ABNORMAL EKG 02/09/2009  .  HYPOTHYROIDISM 02/07/2009  . CAROTID ARTERY DISEASE 02/07/2009  . PVD 02/07/2009  . EMPHYSEMA 02/07/2009  . DYSPNEA 02/07/2009  . HYPERTENSION 08/18/2008  . C V A / STROKE 08/18/2008  . Allergic rhinitis 08/18/2008  . ASTHMA 08/18/2008  . COPD (chronic obstructive pulmonary disease) ([place]) 08/18/2008  . BREAST CANCER, HX OF 08/18/2008    Past Surgical History  Procedure Laterality Date  . Carotid endarterectomy  2005    left  . Thyroidectomy  2007  . Appendectomy  1950  . Breast lumpectomy  2002    right-hx of radiation  . Dilation and curettage of uterus  1950's  . Cataract extraction w/ intraocular lens implant   02/2011    right  . Excisional hemorrhoidectomy  1950's  . Cardiac catheterization    . Colonoscopy N/A 01/12/2014    Procedure: COLONOSCOPY;  Surgeon: Gatha Mayer, MD;  Location: [facility] ENDOSCOPY;  Service: Endoscopy;  Laterality: N/A;  MAC if available    Current Outpatient Rx  Name  Route  Sig  Dispense  Refill  . albuterol (PROVENTIL HFA;VENTOLIN HFA) 108 (90 BASE) MCG/ACT inhaler   Inhalation   Inhale 2 puffs into the lungs every 6 (six) hours as needed for wheezing or shortness of breath.   1 Inhaler   5   . ALPRAZolam (XANAX) 0.5 MG tablet      at bedtime as needed.          Marland Kitchen aspirin 81 MG EC tablet   Oral   Take 81 mg by mouth daily.           Marland Kitchen atorvastatin (LIPITOR) 20 MG tablet   Oral   Take 20 mg by mouth daily.           . budesonide-formoterol (SYMBICORT) 160-4.5 MCG/ACT inhaler   Inhalation   Inhale 2 puffs into the lungs 2 (two) times daily.   1 Inhaler   5   . Calcium Carbonate (CALCIUM 600) 1500 MG TABS   Oral   Take 1 tablet by mouth daily.           . cetirizine (ZYRTEC) 10 MG tablet   Oral   Take 10 mg by mouth daily.         . Cholecalciferol (VITAMIN D-3 PO)   Oral   Take 1,000 Units by mouth daily.          Marland Kitchen dextromethorphan (DELSYM) 30 MG/5ML liquid   Oral   Take by mouth at bedtime.         Marland Kitchen escitalopram (LEXAPRO) 20 MG tablet   Oral   Take 20 mg by mouth daily.          . fluticasone (FLONASE) 50 MCG/ACT nasal spray   Each Nare   Place 2 sprays into both nostrils daily.   16 g   5   . furosemide (LASIX) 20 MG tablet   Oral   Take 20 mg by mouth daily.         . INCRUSE ELLIPTA 62.5 MCG/INH AEPB      INHALE 1 PUFFS BY MOUTH ONCE DAILY   30 each   5   . levothyroxine (SYNTHROID, LEVOTHROID) 150 MCG tablet   Oral   Take 1 tablet by mouth daily.         . metoprolol succinate (TOPROL-XL) 50 MG 24 hr tablet   Oral   Take 50 mg by mouth daily.          . Multiple Vitamins-Minerals (  ONE-A-DAY  EXTRAS ANTIOXIDANT) CAPS   Oral   Take 1 capsule by mouth daily.           . NON FORMULARY   Oral   Take 1 capsule by mouth daily. Take  [place] Vitamins daily  -  Vit  B, C, D3, and E.         . polyethylene glycol (MIRALAX / GLYCOLAX) packet   Oral   Take 17 g by mouth daily.   14 each   0   . tolterodine (DETROL LA) 2 MG 24 hr capsule   Oral   Take 2 mg by mouth daily.         . traZODone (DESYREL) 100 MG tablet   Oral   Take 1 tablet by mouth at bedtime.           Allergies Latex  Family History  Problem Relation Age of Onset  . Emphysema Father   . COPD Sister   . Hypertension Sister     Pulmonary  . Emphysema Sister   . Asthma Sister   . Asthma Sister   . Emphysema Brother     x2  . Colon cancer Brother   . Colon cancer Mother   . Stroke Mother     Social History Social History  Substance Use Topics  . Smoking status: Former Smoker -- 1.00 packs/day for 30 years    Types: Cigarettes    Quit date: 09/16/1997  . Smokeless tobacco: Never Used  . Alcohol Use: No    Review of Systems Constitutional: No fever/chills Eyes: No visual changes. ENT: No sore throat. No stiff neck no neck pain Cardiovascular: Denies chest pain. Respiratory: Denies shortness of breath. Gastrointestinal:   no vomiting.  No diarrhea.  No constipation. Genitourinary: Negative for dysuria. Musculoskeletal: Negative lower extremity swelling Skin: Negative for rash. Neurological: Negative for headaches, focal weakness or numbness. 10-point ROS otherwise negative.  ____________________________________________   PHYSICAL EXAM:  VITAL SIGNS: ED Triage Vitals  Enc Vitals Group     BP 09/13/15 1534 124/53 mmHg     Pulse Rate 09/13/15 1534 109     Resp 09/13/15 1534 20     Temp 09/13/15 1534 98.6 F (37 C)     Temp Source 09/13/15 1534 Oral     SpO2 09/13/15 1534 94 %     Weight 09/13/15 1534 165 lb (74.844 kg)     Height 09/13/15 1534 5\' 7"  (1.702 m)     Head  Cir --      Peak Flow --      Pain Score --      Pain Loc --      Pain Edu? --      Excl. in [place]? --     Constitutional: Alert and oriented. Well appearing and in no acute distress. Eyes: Conjunctivae are normal. PERRL. EOMI. Head: Atraumatic. Nose: No congestion/rhinnorhea. Mouth/Throat: Mucous membranes are moist.  Oropharynx non-erythematous. Neck: No stridor.   Nontender with no meningismus Cardiovascular: Normal rate, regular rhythm. Grossly normal heart sounds.  Good peripheral circulation. Respiratory: Normal respiratory effort.  No retractions. Lungs CTAB. Abdominal: Soft and nontender. No distention. No guarding no rebound Back:  There is no focal tenderness or step off there is no midline tenderness there are no lesions noted. there is no CVA tenderness Rectal exam: Guaiac positive brown stool, female nurse chaperone present Musculoskeletal: No lower extremity tenderness. No joint effusions, no DVT signs strong distal pulses no edema Neurologic:  Normal speech and language. No gross focal neurologic deficits are appreciated.  Skin:  Skin is warm, dry and intact. No rash noted. Psychiatric: Mood and affect are normal. Speech and behavior are normal.  ____________________________________________   LABS (all labs ordered are listed, but only abnormal results are displayed)  Labs Reviewed  COMPREHENSIVE METABOLIC PANEL - Abnormal; Notable for the following:    Potassium 3.3 (*)    Glucose, Bld 106 (*)    BUN 22 (*)    AST 14 (*)    ALT 10 (*)    All other components within normal limits  CBC - Abnormal; Notable for the following:    WBC 11.6 (*)    RBC 2.85 (*)    Hemoglobin 8.4 (*)    HCT 26.7 (*)    MCHC 31.5 (*)    All other components within normal limits  TYPE AND SCREEN  ABO/RH   ____________________________________________  EKG  I personally interpreted any EKGs ordered by me or triage Sinus tachycardia rate 101 bpm, left axis deviation, no acute ST  elevation or depression, nonspecific ST changes ____________________________________________  RADIOLOGY  I reviewed any imaging ordered by me or triage that were performed during my shift ____________________________________________   PROCEDURES  Procedure(s) performed: None  Critical Care performed: CRITICAL CARE Performed by: Schuyler Amor   Total critical care time: 32 minutes  Critical care time was exclusive of separately billable procedures and treating other patients.  Critical care was necessary to treat or prevent imminent or life-threatening deterioration.  Critical care was time spent personally by me on the following activities: development of treatment plan with patient and/or surrogate as well as nursing, discussions with consultants, evaluation of patient's response to treatment, examination of patient, obtaining history from patient or surrogate, ordering and performing treatments and interventions, ordering and review of laboratory studies, ordering and review of radiographic studies, pulse oximetry and re-evaluation of patient's condition.   ____________________________________________   INITIAL IMPRESSION / ASSESSMENT AND PLAN / ED COURSE  Pertinent labs & imaging results that were available during my care of the patient were reviewed by me and considered in my medical decision making (see chart for details).  Patient with a history of recurrent GI bleeds hemoglobin now is 8.5 at baseline it is 12.3 when last checked earlier this month. Patient has had bleeding and is guaiac positive. She is nontoxic in appearance but will need to be admitted for blood transfusion and further evaluation of likely bleeding AVM. ____________________________________________   FINAL CLINICAL IMPRESSION(S) / ED DIAGNOSES  Final diagnoses:  None     Schuyler Amor, MD 09/13/15 [phone]

## 2015-09-13 NOTE — H&P (Signed)
[facility] [place] NAME: [name]    MR#:  [id number]  DATE OF BIRTH:  1933/05/06  DATE OF ADMISSION:  09/13/2015  PRIMARY CARE PHYSICIAN: [name], MD   REQUESTING/REFERRING PHYSICIAN: Dr. Burlene Arnt  CHIEF COMPLAINT:   Chief Complaint  Patient presents with  . GI Bleeding  . abnormal lab     HISTORY OF PRESENT ILLNESS:  [name]  is a 81 y.o. female with a known history of colonic AVMs, pulmonary hypertension, COPD, CHF. On Christmas eve she had a few episodes of diarrhea. She did not feel well on Christmas day lightheaded and dizzy. She has a history of colonic AVMs and never sees blood but always has blood in her stool. She has been keeping up with her hemoglobins on a monthly basis and she does get iron infusions through hematologist as outpatient. She did not feel well and was seen by her PCP and they drew a hemoglobin was 8 and was sent in for a transfusion. Her normal hemoglobin has been around 12. The ER physician ordered a unit of blood and called the hospitalist for an admission. Patient does not want to have any further GI workup while here.  PAST MEDICAL HISTORY:   Past Medical History  Diagnosis Date  . COPD (chronic obstructive pulmonary disease) ([place])   . Hypertrophic obstructive cardiomyopathy     Echo 10/10 EF 65-70% SW 1.6 PW 1.4 mild SAM no LVOT gradient at rest. Valsalva not performed Mild MR. No hyperenhance ment on MRI EF 69%.  . Coronary atherosclerosis of native coronary artery     Cath 1/11: RCA 50-60 ostial o/w normal. EF 70%.   . Pulmonary hypertension ([place])     Cath 1/11 RA 8, RV 43/6/14, PA 42/17 (29) PCWP 15 Fick  5.0/2.6. PVR 2.8    . Edema   . Occlusion and stenosis of carotid artery     s/p L CEA u/s 3/11. R 60-79%; L 40-59%. no change since 3/10  . Peripheral vascular disease, unspecified ([place])   . Hypertension   . Emphysema   . Asthma   . Hypothyroidism   . Allergic  rhinitis   . Gastric AVM   . Heart murmur   . Anginal pain ([place])     "pressure"  . Emphysema   . Oxygen dependent 04/23/2012    "concentrated at night; liquid during day"  . Dyspnea 04/23/2012    "all the time"  . Anemia 04/23/2012    "severe @ times; think caused by colonic AVMs"  . History of blood transfusion     "many times"  . H/O hiatal hernia   . Stroke [facility]) ~ 2004    denies residual on 04/23/2012  . Arthritis     "probably"  . Breast cancer ([place])   . Pneumonia     PAST SURGICAL HISTORY:   Past Surgical History  Procedure Laterality Date  . Carotid endarterectomy  2005    left  . Thyroidectomy  2007  . Appendectomy  1950  . Breast lumpectomy  2002    right-hx of radiation  . Dilation and curettage of uterus  1950's  . Cataract extraction w/ intraocular lens implant  02/2011    right  . Excisional hemorrhoidectomy  1950's  . Cardiac catheterization    . Colonoscopy N/A 01/12/2014    Procedure: COLONOSCOPY;  Surgeon: Gatha Mayer, MD;  Location: [facility];  Service: Endoscopy;  Laterality: N/A;  MAC if available    SOCIAL HISTORY:   Social History  Substance Use Topics  . Smoking status: Former Smoker -- 1.00 packs/day for 30 years    Types: Cigarettes    Quit date: 09/16/1997  . Smokeless tobacco: Never Used  . Alcohol Use: No    FAMILY HISTORY:   Family History  Problem Relation Age of Onset  . Emphysema Father   . COPD Sister   . Hypertension Sister     Pulmonary  . Emphysema Sister   . Asthma Sister   . Asthma Sister   . Emphysema Brother     x2  . Colon cancer Brother   . Colon cancer Mother   . Stroke Mother     DRUG ALLERGIES:   Allergies  Allergen Reactions  . Latex Hives and Itching    REVIEW OF SYSTEMS:  CONSTITUTIONAL: No fever, positive for fatigue. Positive for weight gain 5 pounds. EYES: No blurred or double vision. Wears glasses. EARS, NOSE, AND THROAT: No tinnitus or ear pain. No sore throat. Definite left ear. Positive  runny nose. RESPIRATORY: No cough, positive for shortness of breath with walking, no wheezing or hemoptysis.  CARDIOVASCULAR: No chest pain, positive for edema.  GASTROINTESTINAL: No nausea, vomiting, diarrhea or abdominal pain. No blood in bowel movements that she sees but always has blood in the stool. GENITOURINARY: No dysuria, hematuria.  ENDOCRINE: No polyuria, nocturia,  HEMATOLOGY: History of anemia SKIN: No rash or lesion. MUSCULOSKELETAL: Positive for joint pain NEUROLOGIC: No tingling, numbness, weakness.  PSYCHIATRY: No anxiety or depression.   MEDICATIONS AT HOME:   Prior to Admission medications   Medication Sig Start Date End Date Taking? Authorizing Provider  albuterol (PROVENTIL HFA;VENTOLIN HFA) 108 (90 BASE) MCG/ACT inhaler Inhale 2 puffs into the lungs every 6 (six) hours as needed for wheezing or shortness of breath. 01/12/15  Yes Collene Gobble, MD  ALPRAZolam Duanne Moron) 0.5 MG tablet Take 0.25-0.5 mg by mouth at bedtime as needed for sleep.  02/09/15  Yes Historical Provider, MD  aspirin 81 MG EC tablet Take 81 mg by mouth daily.     Yes Historical Provider, MD  atorvastatin (LIPITOR) 20 MG tablet Take 20 mg by mouth daily.     Yes Historical Provider, MD  budesonide-formoterol (SYMBICORT) 160-4.5 MCG/ACT inhaler Inhale 2 puffs into the lungs 2 (two) times daily. 01/12/15  Yes Collene Gobble, MD  Calcium Carbonate (CALCIUM 600) 1500 MG TABS Take 600 mg of elemental calcium by mouth daily.    Yes Historical Provider, MD  cetirizine (ZYRTEC) 10 MG tablet Take 10 mg by mouth daily.   Yes Historical Provider, MD  cholecalciferol (VITAMIN D) 1000 units tablet Take 1,000 Units by mouth daily.   Yes Historical Provider, MD  dextromethorphan (DELSYM) 30 MG/5ML liquid Take 30 mg by mouth at bedtime as needed for cough.    Yes Historical Provider, MD  escitalopram (LEXAPRO) 20 MG tablet Take 20 mg by mouth daily.    Yes Historical Provider, MD  fluticasone (FLONASE) 50 MCG/ACT nasal  spray Place 2 sprays into both nostrils daily. Patient taking differently: Place 2 sprays into both nostrils daily as needed for rhinitis.  04/14/15  Yes Collene Gobble, MD  furosemide (LASIX) 20 MG tablet Take 20 mg by mouth daily.   Yes Historical Provider, MD  levothyroxine (SYNTHROID, LEVOTHROID) 150 MCG tablet Take 150 mcg by mouth daily.    Yes Historical Provider, MD  Multiple Vitamin (MULTIVITAMIN WITH MINERALS) TABS tablet  Take 1 tablet by mouth daily.   Yes Historical Provider, MD  polyethylene glycol (MIRALAX / GLYCOLAX) packet Take 17 g by mouth daily as needed for mild constipation.   Yes Historical Provider, MD  tolterodine (DETROL LA) 2 MG 24 hr capsule Take 2 mg by mouth 2 (two) times daily.    Yes Historical Provider, MD  Umeclidinium Bromide (INCRUSE ELLIPTA) 62.5 MCG/INH AEPB Inhale 1 puff into the lungs at bedtime.   Yes Historical Provider, MD      VITAL SIGNS:  Blood pressure 107/62, pulse 91, temperature 98.6 F (37 C), temperature source Oral, resp. rate 26, height 5\' 7"  (1.702 m), weight 74.844 kg (165 lb), SpO2 100 %.  PHYSICAL EXAMINATION:  GENERAL:  81 y.o.-year-old patient lying in the bed with no acute distress.  EYES: Pupils equal, round, reactive to light and accommodation. No scleral icterus. Extraocular muscles intact.  HEENT: Head atraumatic, normocephalic. Oropharynx and nasopharynx clear.  NECK:  Supple, no jugular venous distention. No thyroid enlargement, no tenderness.  LUNGS: Normal breath sounds bilaterally, no wheezing,  positive ralesat bases , no rhonchi or crepitation. No use of accessory muscles of respiration.  CARDIOVASCULAR: S1, S2 normal.  4/6  systolicmurmur,  no rubs, or gallops.  ABDOMEN: Soft, nontender, nondistended. Bowel sounds present. No organomegaly or mass.  EXTREMITIES: 2+  pedal edema,  no cyanosis, or clubbing.  NEUROLOGIC: Cranial nerves II through XII are intact. Muscle strength 5/5 in all extremities. Sensation intact. Gait  not checked.  PSYCHIATRIC: The patient is alert and oriented x 3.  SKIN: No rash, lesion, or ulcer.   LABORATORY PANEL:   CBC  Recent Labs Lab 09/13/15 1537  WBC 11.6*  HGB 8.4*  HCT 26.7*  PLT 291   ------------------------------------------------------------------------------------------------------------------  Chemistries   Recent Labs Lab 09/13/15 1537  NA 140  K 3.3*  CL 101  CO2 30  GLUCOSE 106*  BUN 22*  CREATININE 0.78  CALCIUM 8.9  AST 14*  ALT 10*  ALKPHOS 86  BILITOT 0.4   ------------------------------------------------------------------------------------------------------------------  EKG:   Sinus tachycardia, left atrial enlargement, left axis deviation, left ventricular hypertrophy, Q waves inferiorly and anterior laterally   IMPRESSION AND PLAN:   1. Symptomatic anemia. Her normal hemoglobin is in the 12 range and she dropped down to 8.4. The ER physician ordered a unit of blood. I will add on a ferritin and give IV Venofer tomorrow morning. The patient does not want a GI workup while here. They will follow-up with her hematologist next week for further evaluation. Will admit as observation. Would stop aspirin at this point. 2. History of diastolic CHF. With the transfusion of blood I will give Lasix before and after transfusion. 3. Hypokalemia- give oral potassium today 4. Hypothyroidism unspecified continue levothyroxine 5. Hyperlipidemia on Lipitor  All the records are reviewed and case discussed with ED provider. Management plans discussed with the patient, family and they are in agreement.  CODE STATUS: DO NOT RESUSCITATE  TOTAL TIME TAKING CARE OF THIS PATIENT: 50 minutes.    Loletha Grayer M.D on 09/13/2015 at 7:25 PM  Between 7am to 6pm - Pager - [phone]  After 6pm call admission pager [facility]  Office  [phone]  CC: Primary care physician; [name], MD

## 2015-09-13 NOTE — ED Notes (Signed)
Spoke with Dr. Burlene Arnt, verbal order given to administer blood transfusion at 170 mL/hr.

## 2015-09-13 NOTE — ED Notes (Signed)
Pt to ed with reports that she has been having weakness and dizziness, went to pcp today and hgb was 8.4. PT noticed some blood in her stool on 12/24 but has not had BM since then.

## 2015-09-14 DIAGNOSIS — K921 Melena: Secondary | ICD-10-CM | POA: Diagnosis not present

## 2015-09-14 LAB — HEMOGLOBIN AND HEMATOCRIT, BLOOD
HCT: 27.9 % — ABNORMAL LOW (ref 35.0–47.0)
HEMATOCRIT: 27.7 % — AB (ref 35.0–47.0)
HEMATOCRIT: 28.9 % — AB (ref 35.0–47.0)
HEMOGLOBIN: 9.2 g/dL — AB (ref 12.0–16.0)
HEMOGLOBIN: 9.2 g/dL — AB (ref 12.0–16.0)
Hemoglobin: 9.2 g/dL — ABNORMAL LOW (ref 12.0–16.0)

## 2015-09-14 LAB — CBC
HCT: 27.7 % — ABNORMAL LOW (ref 35.0–47.0)
Hemoglobin: 9.3 g/dL — ABNORMAL LOW (ref 12.0–16.0)
MCH: 30.5 pg (ref 26.0–34.0)
MCHC: 33.5 g/dL (ref 32.0–36.0)
MCV: 91.2 fL (ref 80.0–100.0)
PLATELETS: 259 10*3/uL (ref 150–440)
RBC: 3.04 MIL/uL — ABNORMAL LOW (ref 3.80–5.20)
RDW: 14 % (ref 11.5–14.5)
WBC: 8.7 10*3/uL (ref 3.6–11.0)

## 2015-09-14 LAB — TYPE AND SCREEN
ABO/RH(D): O POS
ANTIBODY SCREEN: NEGATIVE
Unit division: 0

## 2015-09-14 LAB — BASIC METABOLIC PANEL
Anion gap: 6 (ref 5–15)
BUN: 17 mg/dL (ref 6–20)
CALCIUM: 8.2 mg/dL — AB (ref 8.9–10.3)
CO2: 34 mmol/L — ABNORMAL HIGH (ref 22–32)
Chloride: 102 mmol/L (ref 101–111)
Creatinine, Ser: 0.66 mg/dL (ref 0.44–1.00)
GFR calc Af Amer: >60 mL/min (ref >60)
GLUCOSE: 110 mg/dL — AB (ref 65–99)
POTASSIUM: 3.5 mmol/L (ref 3.5–5.1)
SODIUM: 142 mmol/L (ref 135–145)

## 2015-09-14 LAB — OCCULT BLOOD X 1 CARD TO LAB, STOOL: Fecal Occult Bld: POSITIVE — AB

## 2015-09-14 MED ORDER — CALCIUM CARBONATE ANTACID 500 MG PO CHEW
600.0000 mg | CHEWABLE_TABLET | Freq: Every day | ORAL | Status: DC
  Administered 2015-09-14: 09:00:00 600 mg via ORAL
  Filled 2015-09-14: qty 3

## 2015-09-14 MED ORDER — LACTULOSE 10 GM/15ML PO SOLN
20.0000 g | Freq: Two times a day (BID) | ORAL | Status: DC | PRN
  Administered 2015-09-14: 10:00:00 20 g via ORAL
  Filled 2015-09-14: qty 30

## 2015-09-14 MED ORDER — SENNOSIDES-DOCUSATE SODIUM 8.6-50 MG PO TABS
1.0000 | ORAL_TABLET | Freq: Two times a day (BID) | ORAL | Status: DC
  Administered 2015-09-14 – 2015-09-15 (×3): 1 via ORAL
  Filled 2015-09-14 (×3): qty 1

## 2015-09-14 NOTE — Care Management Obs Status (Signed)
[facility] NOTIFICATION   Patient Details  Name: Chessa Lightburn MRN: [id number] Date of Birth: 01-Oct-1932   Medicare Observation Status Notification Given:  Yes    Shelbie Ammons, RN 09/14/2015, 9:41 AM

## 2015-09-14 NOTE — Plan of Care (Signed)
Problem: Safety: Goal: Ability to remain free from injury will improve Outcome: Progressing High fall risk. Bed alarm on, hourly rounding. Safe environment provided. Pt understands how to use call system for assistance.   Problem: Health Behavior/Discharge Planning: Goal: Ability to manage health-related needs will improve Outcome: Progressing Pt is from home, walks w/ straight cane. Lives with sister who is 82. They have 24/7 private sitter/caregiver   Problem: Skin Integrity: Goal: Risk for impaired skin integrity will decrease Outcome: Progressing Skin dry, intact. Urinary frequency r/t lasix given overnight causing accidental incontinence. Skin care provided.   Problem: Activity: Goal: Risk for activity intolerance will decrease Outcome: Progressing +1 assist to [facility]. SOB w/ exertion noted which is pt baseline. Uses 2L oxygen PRN & at bedtime at home. Rest periods provided.   Problem: Bowel/Gastric: Goal: Will not experience complications related to bowel motility Outcome: Progressing No BM since 09/09/15. Pt states this is normal to go multiple days w/o BM.

## 2015-09-14 NOTE — Care Management (Signed)
Admitted to [facility] with the diagnosis of symptomatic anemia. Lives with sister Dub Mikes since 2001. Last seen Dr. Mancel Bale at [facility] in [facility] yesterday. Niece/POA is Su Hoff [phone] or [phone]). Sister has nursing services in the home 24/7 out of [place]. Home Health thru [place] 2015. Home Health thru [place] in the past. Takes care of all basic activities of daily care herself. Limited driving. Niece helps with errands. [facility] 2014.  Niece will transport. Shelbie Ammons RN MSN CCM Care Management [phone]

## 2015-09-14 NOTE — Plan of Care (Addendum)
Problem: Bowel/Gastric: Goal: Will not experience complications related to bowel motility Outcome: Progressing Plan of care progress: -Monitor hemoglobin and hematocrit closely and transfuse as needed   -The patient does not want a GI workup while here, would like to follow up outpt -Check stool for Hemoccult, stool softner and laxative given per orders, 1 large black stool, positive occult blood -per MD If hemoglobin is stable will discharge patient in a.m.   -pt with no complaints of pain, no distress or discomfort noted -remains fall/injury free, bed alarm in place, call bell in reach -pt voices any needs

## 2015-09-14 NOTE — Progress Notes (Signed)
[facility] [place] NAME: [name]    MR#:  [id number]  DATE OF BIRTH:  July 15, 1933  SUBJECTIVE:  CHIEF COMPLAINT:  Patient has received 1 unit of blood. Denies any hematemesis or melena. Prefers outpatient follow-up with gastroenterology and hem oncology  REVIEW OF SYSTEMS:  CONSTITUTIONAL: No fever, fatigue or weakness.  EYES: No blurred or double vision.  EARS, NOSE, AND THROAT: No tinnitus or ear pain.  RESPIRATORY: No cough, shortness of breath, wheezing or hemoptysis.  CARDIOVASCULAR: No chest pain, orthopnea, edema.  GASTROINTESTINAL: No nausea, vomiting, diarrhea or abdominal pain.  GENITOURINARY: No dysuria, hematuria.  ENDOCRINE: No polyuria, nocturia,  HEMATOLOGY: No anemia, easy bruising or bleeding SKIN: No rash or lesion. MUSCULOSKELETAL: No joint pain or arthritis.   NEUROLOGIC: No tingling, numbness, weakness.  PSYCHIATRY: No anxiety or depression.   DRUG ALLERGIES:   Allergies  Allergen Reactions  . Latex Hives and Itching    VITALS:  Blood pressure 112/60, pulse 78, temperature 97.8 F (36.6 C), temperature source Oral, resp. rate 18, height 5' 6.5" (1.689 m), weight 71.351 kg (157 lb 4.8 oz), SpO2 98 %.  PHYSICAL EXAMINATION:  GENERAL:  81 y.o.-year-old patient lying in the bed with no acute distress.  EYES: Pupils equal, round, reactive to light and accommodation. No scleral icterus. Extraocular muscles intact.  HEENT: Head atraumatic, normocephalic. Oropharynx and nasopharynx clear.  NECK:  Supple, no jugular venous distention. No thyroid enlargement, no tenderness.  LUNGS: Normal breath sounds bilaterally, no wheezing, rales,rhonchi or crepitation. No use of accessory muscles of respiration.  CARDIOVASCULAR: S1, S2 normal. No murmurs, rubs, or gallops.  ABDOMEN: Soft, nontender, nondistended. Bowel sounds present. No organomegaly or mass.  EXTREMITIES: No pedal edema, cyanosis, or clubbing.   NEUROLOGIC: Cranial nerves II through XII are intact. Muscle strength 5/5 in all extremities. Sensation intact. Gait not checked.  PSYCHIATRIC: The patient is alert and oriented x 3.  SKIN: No obvious rash, lesion, or ulcer.    LABORATORY PANEL:   CBC  Recent Labs Lab 09/14/15 0620 09/14/15 1000  WBC 8.7  --   HGB 9.3* 9.2*  HCT 27.7* 27.9*  PLT 259  --    ------------------------------------------------------------------------------------------------------------------  Chemistries   Recent Labs Lab 09/13/15 1537 09/14/15 0620  NA 140 142  K 3.3* 3.5  CL 101 102  CO2 30 34*  GLUCOSE 106* 110*  BUN 22* 17  CREATININE 0.78 0.66  CALCIUM 8.9 8.2*  AST 14*  --   ALT 10*  --   ALKPHOS 86  --   BILITOT 0.4  --    ------------------------------------------------------------------------------------------------------------------  Cardiac Enzymes No results for input(s): TROPONINI in the last 168 hours. ------------------------------------------------------------------------------------------------------------------  RADIOLOGY:  No results found.  EKG:   Orders placed or performed during the hospital encounter of 09/13/15  . EKG 12-Lead  . EKG 12-Lead    ASSESSMENT AND PLAN:   .1. Symptomatic anemia.  Her normal hemoglobin is in the 12 range and she dropped down to 8.4.  Status post 1 unit of blood. Received  IV Venofer today.  Hb - 8.4-9.3 today  Monitor hemoglobin and hematocrit closely and transfuse as needed  The patient does not want a GI workup while here. She prefers outpatient GI and oncology follow-up with Dr. Baron Hamper Check stool for Hemoccult  If hemoglobin is stable will discharge patient in a.m.  Would stop aspirin at this point.  2. History of diastolic CHF. Not fluid overloaded at this  time. Continue Lasix 3. Hypokalemia- give oral potassium when necessary, potassium at 3.5 4. Hypothyroidism unspecified continue levothyroxine 5. Hyperlipidemia  on Lipitor    All the records are reviewed and case discussed with Care Management/Social Workerr. Management plans discussed with the patient, family and they are in agreement.  CODE STATUS: fc  TOTAL TIME TAKING CARE OF THIS PATIENT: 35 minutes.   POSSIBLE D/C IN am  DAYS, DEPENDING ON CLINICAL CONDITION.   Nicholes Mango M.D on 09/14/2015 at 1:23 PM  Between 7am to 6pm - Pager - [phone] After 6pm go to [email or web address] - password [email or web address] [facility]  Office  [phone]  CC: Primary care physician; [name], MD

## 2015-09-15 DIAGNOSIS — K921 Melena: Secondary | ICD-10-CM | POA: Diagnosis not present

## 2015-09-15 LAB — CBC
HEMATOCRIT: 29 % — AB (ref 35.0–47.0)
Hemoglobin: 9.6 g/dL — ABNORMAL LOW (ref 12.0–16.0)
MCH: 31.3 pg (ref 26.0–34.0)
MCHC: 33.1 g/dL (ref 32.0–36.0)
MCV: 94.5 fL (ref 80.0–100.0)
PLATELETS: 257 10*3/uL (ref 150–440)
RBC: 3.07 MIL/uL — ABNORMAL LOW (ref 3.80–5.20)
RDW: 14.3 % (ref 11.5–14.5)
WBC: 8.3 10*3/uL (ref 3.6–11.0)

## 2015-09-15 MED ORDER — CALCIUM CARBONATE ANTACID 500 MG PO CHEW: 600.0000 mg | CHEWABLE_TABLET | Freq: Every day | ORAL | Status: DC

## 2015-09-15 MED ORDER — SENNOSIDES-DOCUSATE SODIUM 8.6-50 MG PO TABS: 1.0000 | ORAL_TABLET | Freq: Two times a day (BID) | ORAL | Status: DC

## 2015-09-15 NOTE — Progress Notes (Signed)
Pt being discharged home at this time, discharge and prescription reviewed with pt and family, states understanding, pt with no noted complaints at discharge, pt happy to be going home

## 2015-09-15 NOTE — Discharge Summary (Signed)
[facility] [place] NAME: [name]    MR#:  [id number]  DATE OF BIRTH:  02/27/1933  DATE OF ADMISSION:  09/13/2015 ADMITTING PHYSICIAN: Loletha Grayer, MD  DATE OF DISCHARGE: 09/15/2015 PRIMARY CARE PHYSICIAN: [name], MD    ADMISSION DIAGNOSIS:  Gastrointestinal hemorrhage, unspecified gastritis, unspecified gastrointestinal hemorrhage type [K92.2]  DISCHARGE DIAGNOSIS:  Active Problems:   Symptomatic anemia   SECONDARY DIAGNOSIS:   Past Medical History  Diagnosis Date  . COPD (chronic obstructive pulmonary disease) ([place])   . Hypertrophic obstructive cardiomyopathy     Echo 10/10 EF 65-70% SW 1.6 PW 1.4 mild SAM no LVOT gradient at rest. Valsalva not performed Mild MR. No hyperenhance ment on MRI EF 69%.  . Coronary atherosclerosis of native coronary artery     Cath 1/11: RCA 50-60 ostial o/w normal. EF 70%.   . Pulmonary hypertension ([place])     Cath 1/11 RA 8, RV 43/6/14, PA 42/17 (29) PCWP 15 Fick  5.0/2.6. PVR 2.8    . Edema   . Occlusion and stenosis of carotid artery     s/p L CEA u/s 3/11. R 60-79%; L 40-59%. no change since 3/10  . Peripheral vascular disease, unspecified ([place])   . Hypertension   . Emphysema   . Asthma   . Hypothyroidism   . Allergic rhinitis   . Gastric AVM   . Heart murmur   . Anginal pain ([place])     "pressure"  . Emphysema   . Oxygen dependent 04/23/2012    "concentrated at night; liquid during day"  . Dyspnea 04/23/2012    "all the time"  . Anemia 04/23/2012    "severe @ times; think caused by colonic AVMs"  . History of blood transfusion     "many times"  . H/O hiatal hernia   . Stroke [facility]) ~ 2004    denies residual on 04/23/2012  . Arthritis     "probably"  . Breast cancer ([place])   . Pneumonia     HOSPITAL COURSE:  1. Symptomatic anemia.  Her normal hemoglobin is in the 12 range and she dropped down to 8.4 at the time of admission Status post 1 unit of  blood. Received IV Venofer today.  Hb - 8.4-9.3 today  Monitored hemoglobin and hematocrit during hospital course The patient does not want a GI workup while here in the hospital. She prefers outpatient GI and oncology follow-up with Dr. Baron Hamper.  Would stop aspirin at this point until seen by dr.Fing  2. History of diastolic CHF. Not fluid overloaded at this time. Continue Lasix 3. Hypokalemia- give oral potassium when necessary, potassium at 3.5 4. Hypothyroidism unspecified continue levothyroxine 5. Hyperlipidemia on Lipitor    DISCHARGE CONDITIONS:  fair  CONSULTS OBTAINED:  Treatment Team:  Loletha Grayer, MD   PROCEDURES none   DRUG ALLERGIES:   Allergies  Allergen Reactions  . Latex Hives and Itching    DISCHARGE MEDICATIONS:   Current Discharge Medication List    START taking these medications   Details  calcium carbonate (TUMS - DOSED IN MG ELEMENTAL CALCIUM) 500 MG chewable tablet Chew 3 tablets (600 mg of elemental calcium total) by mouth daily.    senna-docusate (SENOKOT-S) 8.6-50 MG tablet Take 1 tablet by mouth 2 (two) times daily. Qty: 30 tablet, Refills: 0      CONTINUE these medications which have NOT CHANGED   Details  albuterol (PROVENTIL HFA;VENTOLIN HFA) 108 (90 BASE)  MCG/ACT inhaler Inhale 2 puffs into the lungs every 6 (six) hours as needed for wheezing or shortness of breath. Qty: 1 Inhaler, Refills: 5    ALPRAZolam (XANAX) 0.5 MG tablet Take 0.25-0.5 mg by mouth at bedtime as needed for sleep.     atorvastatin (LIPITOR) 20 MG tablet Take 20 mg by mouth daily.      budesonide-formoterol (SYMBICORT) 160-4.5 MCG/ACT inhaler Inhale 2 puffs into the lungs 2 (two) times daily. Qty: 1 Inhaler, Refills: 5    cetirizine (ZYRTEC) 10 MG tablet Take 10 mg by mouth daily.    cholecalciferol (VITAMIN D) 1000 units tablet Take 1,000 Units by mouth daily.    dextromethorphan (DELSYM) 30 MG/5ML liquid Take 30 mg by mouth at bedtime as needed for  cough.     escitalopram (LEXAPRO) 20 MG tablet Take 20 mg by mouth daily.     fluticasone (FLONASE) 50 MCG/ACT nasal spray Place 2 sprays into both nostrils daily. Qty: 16 g, Refills: 5    furosemide (LASIX) 20 MG tablet Take 20 mg by mouth daily.    levothyroxine (SYNTHROID, LEVOTHROID) 150 MCG tablet Take 150 mcg by mouth daily.     Multiple Vitamin (MULTIVITAMIN WITH MINERALS) TABS tablet Take 1 tablet by mouth daily.    polyethylene glycol (MIRALAX / GLYCOLAX) packet Take 17 g by mouth daily as needed for mild constipation.    tolterodine (DETROL LA) 2 MG 24 hr capsule Take 2 mg by mouth 2 (two) times daily.     Umeclidinium Bromide (INCRUSE ELLIPTA) 62.5 MCG/INH AEPB Inhale 1 puff into the lungs at bedtime.      STOP taking these medications     aspirin 81 MG EC tablet      Calcium Carbonate (CALCIUM 600) 1500 MG TABS          DISCHARGE INSTRUCTIONS:   Activity as tolerated, Diet heart healthy Follow-up with primary care physician in a week Follow-up with Dr. Naida Sleight on January 3 as scheduled. Hold taking aspirin until seen and cleared by Dr. Baron Hamper   DIET:  aha  DISCHARGE CONDITION:  fair  ACTIVITY:  As tolerated  OXYGEN:  Home Oxygen: none   Oxygen Delivery: RA  DISCHARGE LOCATION:  home  If you experience worsening of your admission symptoms, develop shortness of breath, life threatening emergency, suicidal or homicidal thoughts you must seek medical attention immediately by calling 911 or calling your MD immediately  if symptoms less severe.  You Must read complete instructions/literature along with all the possible adverse reactions/side effects for all the Medicines you take and that have been prescribed to you. Take any new Medicines after you have completely understood and accpet all the possible adverse reactions/side effects.   Please note  You were cared for by a hospitalist during your hospital stay. If you have any questions about  your discharge medications or the care you received while you were in the hospital after you are discharged, you can call the unit and asked to speak with the hospitalist on call if the hospitalist that took care of you is not available. Once you are discharged, your primary care physician will handle any further medical issues. Please note that NO REFILLS for any discharge medications will be authorized once you are discharged, as it is imperative that you return to your primary care physician (or establish a relationship with a primary care physician if you do not have one) for your aftercare needs so that they can reassess your need for  medications and monitor your lab values.     Today  Chief Complaint  Patient presents with  . GI Bleeding  . abnormal lab    Pt is feeling fine, no dizziness, no  episodes of any hematemesis  ROS:  CONSTITUTIONAL: Denies fevers, chills. Denies any fatigue, weakness.  EYES: Denies blurry vision, double vision, eye pain. EARS, NOSE, THROAT: Denies tinnitus, ear pain, hearing loss. RESPIRATORY: Denies cough, wheeze, shortness of breath.  CARDIOVASCULAR: Denies chest pain, palpitations, edema.  GASTROINTESTINAL: Denies nausea, vomiting, diarrhea, abdominal pain. Denies bright red blood per rectum. GENITOURINARY: Denies dysuria, hematuria. ENDOCRINE: Denies nocturia or thyroid problems. HEMATOLOGIC AND LYMPHATIC: Denies easy bruising or bleeding. SKIN: Denies rash or lesion. MUSCULOSKELETAL: Denies pain in neck, back, shoulder, knees, hips or arthritic symptoms.  NEUROLOGIC: Denies paralysis, paresthesias.  PSYCHIATRIC: Denies anxiety or depressive symptoms.   VITAL SIGNS:  Blood pressure 109/51, pulse 79, temperature 98 F (36.7 C), temperature source Oral, resp. rate 18, height 5' 6.5" (1.689 m), weight 71.351 kg (157 lb 4.8 oz), SpO2 98 %.  I/O:   Intake/Output Summary (Last 24 hours) at 09/15/15 1007 Last data filed at 09/14/15 1800  Gross per  24 hour  Intake    480 ml  Output      3 ml  Net    477 ml    PHYSICAL EXAMINATION:  GENERAL:  81 y.o.-year-old patient lying in the bed with no acute distress.  EYES: Pupils equal, round, reactive to light and accommodation. No scleral icterus. Extraocular muscles intact.  HEENT: Head atraumatic, normocephalic. Oropharynx and nasopharynx clear.  NECK:  Supple, no jugular venous distention. No thyroid enlargement, no tenderness.  LUNGS: Normal breath sounds bilaterally, no wheezing, rales,rhonchi or crepitation. No use of accessory muscles of respiration.  CARDIOVASCULAR: S1, S2 normal. No murmurs, rubs, or gallops.  ABDOMEN: Soft, non-tender, non-distended. Bowel sounds present. No organomegaly or mass.  EXTREMITIES: No pedal edema, cyanosis, or clubbing.  NEUROLOGIC: Cranial nerves II through XII are intact. Muscle strength 5/5 in all extremities. Sensation intact. Gait not checked.  PSYCHIATRIC: The patient is alert and oriented x 3.  SKIN: No obvious rash, lesion, or ulcer.   DATA REVIEW:   CBC  Recent Labs Lab 09/15/15 0730  WBC 8.3  HGB 9.6*  HCT 29.0*  PLT 257    Chemistries   Recent Labs Lab 09/13/15 1537 09/14/15 0620  NA 140 142  K 3.3* 3.5  CL 101 102  CO2 30 34*  GLUCOSE 106* 110*  BUN 22* 17  CREATININE 0.78 0.66  CALCIUM 8.9 8.2*  AST 14*  --   ALT 10*  --   ALKPHOS 86  --   BILITOT 0.4  --     Cardiac Enzymes No results for input(s): TROPONINI in the last 168 hours.  Microbiology Results  Results for orders placed or performed during the hospital encounter of 01/14/14  Culture, blood (routine x 2) Call MD if unable to obtain prior to antibiotics being given     Status: None   Collection Time: 01/14/14 11:20 PM  Result Value Ref Range Status   Specimen Description BLOOD LEFT ANTECUBITAL  Final   Special Requests BOTTLES DRAWN AEROBIC ONLY 10CC  Final   Culture  Setup Time   Final    01/15/2014 03:30 Performed at [place]   Final    NO GROWTH 5 DAYS Performed at [facility]   Report Status 01/21/2014 FINAL  Final  Culture,  blood (routine x 2) Call MD if unable to obtain prior to antibiotics being given     Status: None   Collection Time: 01/14/14 11:30 PM  Result Value Ref Range Status   Specimen Description BLOOD LEFT HAND  Final   Special Requests BOTTLES DRAWN AEROBIC ONLY 4CC  Final   Culture  Setup Time   Final    01/15/2014 08:01 Performed at [facility]   Culture   Final    NO GROWTH 5 DAYS Performed at [facility]   Report Status 01/21/2014 FINAL  Final  Urine culture     Status: None   Collection Time: 01/14/14 11:53 PM  Result Value Ref Range Status   Specimen Description URINE, CLEAN CATCH  Final   Special Requests NONE  Final   Culture  Setup Time   Final    01/15/2014 03:28 Performed at [place] Performed at [facility]  Final   Culture NO GROWTH Performed at [facility]  Final   Report Status 01/17/2014 FINAL  Final    RADIOLOGY:  No results found.  EKG:   Orders placed or performed during the hospital encounter of 09/13/15  . EKG 12-Lead  . EKG 12-Lead      Management plans discussed with the patient, family and they are in agreement.  CODE STATUS:     Code Status Orders        Start     Ordered   09/13/15 2209  Limited resuscitation (code)   Continuous    Question Answer Comment  In the event of cardiac or respiratory ARREST: Initiate Code Blue, Call Rapid Response Yes   In the event of cardiac or respiratory ARREST: Perform CPR Yes   In the event of cardiac or respiratory ARREST: Perform Intubation/Mechanical Ventilation No   In the event of cardiac or respiratory ARREST: Use NIPPV/BiPAp only if indicated Yes   In the event of cardiac or respiratory ARREST: Administer ACLS medications if indicated Yes   In the event of cardiac or respiratory ARREST: Perform  Defibrillation or Cardioversion if indicated Yes      09/13/15 2209    Advance Directive Documentation        Most Recent Value   Type of Advance Directive  Healthcare Power of [place], Living will [Melissa Amash- neice ]   Pre-existing out of facility DNR order (yellow form or pink MOST form)     "MOST" Form in Place?        TOTAL TIME TAKING CARE OF THIS PATIENT:45  minutes.    @[facility] @  on 09/15/2015 at 10:07 AM  Between 7am to 6pm - Pager - [phone]  After 6pm go to [email or web address] - password [email or web address] [facility]  Office  [phone]  CC: Primary care physician; [name], MD

## 2015-09-15 NOTE — Plan of Care (Signed)
VSS, afebrile, Hgb 9.2/Hct 28.9, No c/o pain.   Problem: Safety: Goal: Ability to remain free from injury will improve Outcome: Progressing High fall risk. Bed alarm on, hourly rounding. Safe environment provided. Pt understands how to use call system for assistance.    Problem: Activity: Goal: Risk for activity intolerance will decrease Outcome: Progressing +1 assist to [facility]. SOB w/ exertion noted which is pt baseline. Uses 2L oxygen PRN & at bedtime at home. Rest periods provided.   Problem: Bowel/Gastric: Goal: Will not experience complications related to bowel motility Outcome: Progressing Stool softener given as scheduled. No s/s of bleeding noted.

## 2015-09-15 NOTE — Discharge Instructions (Signed)
Activity as tolerated, Diet heart healthy Follow-up with primary care physician in a week Follow-up with Dr. Naida Sleight on January 3 as scheduled. Hold taking aspirin until seen and cleared by Dr. Baron Hamper Gastrointestinal Bleeding Gastrointestinal bleeding is bleeding somewhere along the path that food travels through the body (digestive tract). This path is anywhere between the mouth and the opening of the butt (anus). You may have blood in your throw up (vomit) or in your poop (stools). If there is a lot of bleeding, you may need to stay in the hospital. [place]  Only take medicine as told by your doctor.  Eat foods with fiber such as whole grains, fruits, and vegetables. You can also try eating 1 to 3 prunes a day.  Drink enough fluids to keep your pee (urine) clear or pale yellow. GET HELP RIGHT AWAY IF:   Your bleeding gets worse.  You feel dizzy, weak, or you pass out (faint).  You have bad cramps in your back or belly (abdomen).  You have large blood clumps (clots) in your poop.  Your problems are getting worse. MAKE SURE YOU:   Understand these instructions.  Will watch your condition.  Will get help right away if you are not doing well or get worse.   This information is not intended to replace advice given to you by your health care provider. Make sure you discuss any questions you have with your health care provider.   Document Released: 06/11/2008 Document Revised: 08/19/2012 Document Reviewed: 02/20/2015 Elsevier Interactive Patient Education [employer].

## 2015-09-15 NOTE — Progress Notes (Addendum)
Per Dr Margaretmary Eddy correction to discharge instructions, pt is to follow up with her GI MD in 1 week, cancel follow up with OBGYN, pt and niece made aware to follow up with GI

## 2015-09-18 ENCOUNTER — Encounter: Payer: Self-pay | Admitting: Hematology

## 2015-09-19 ENCOUNTER — Ambulatory Visit (HOSPITAL_BASED_OUTPATIENT_CLINIC_OR_DEPARTMENT_OTHER): Payer: Medicare Other | Admitting: Hematology

## 2015-09-19 ENCOUNTER — Telehealth: Payer: Self-pay | Admitting: *Deleted

## 2015-09-19 ENCOUNTER — Telehealth: Payer: Self-pay | Admitting: Hematology

## 2015-09-19 ENCOUNTER — Ambulatory Visit (HOSPITAL_BASED_OUTPATIENT_CLINIC_OR_DEPARTMENT_OTHER): Payer: Medicare Other

## 2015-09-19 ENCOUNTER — Other Ambulatory Visit: Payer: Self-pay | Admitting: *Deleted

## 2015-09-19 ENCOUNTER — Other Ambulatory Visit (HOSPITAL_BASED_OUTPATIENT_CLINIC_OR_DEPARTMENT_OTHER): Payer: Medicare Other

## 2015-09-19 VITALS — BP 98/37 | HR 86 | Temp 98.2°F | Resp 16 | Ht 66.5 in | Wt 165.3 lb

## 2015-09-19 VITALS — BP 98/34 | HR 88 | Temp 98.2°F | Resp 18

## 2015-09-19 DIAGNOSIS — D509 Iron deficiency anemia, unspecified: Secondary | ICD-10-CM

## 2015-09-19 DIAGNOSIS — K5521 Angiodysplasia of colon with hemorrhage: Secondary | ICD-10-CM

## 2015-09-19 DIAGNOSIS — R5383 Other fatigue: Secondary | ICD-10-CM

## 2015-09-19 DIAGNOSIS — I10 Essential (primary) hypertension: Secondary | ICD-10-CM | POA: Diagnosis not present

## 2015-09-19 DIAGNOSIS — D500 Iron deficiency anemia secondary to blood loss (chronic): Secondary | ICD-10-CM

## 2015-09-19 LAB — CBC WITH DIFFERENTIAL/PLATELET
BASO%: 0.8 % (ref 0.0–2.0)
Basophils Absolute: 0.1 10*3/uL (ref 0.0–0.1)
EOS ABS: 0.3 10*3/uL (ref 0.0–0.5)
EOS%: 2.9 % (ref 0.0–7.0)
HCT: 28.8 % — ABNORMAL LOW (ref 34.8–46.6)
HGB: 9.5 g/dL — ABNORMAL LOW (ref 11.6–15.9)
LYMPH%: 16.3 % (ref 14.0–49.7)
MCH: 31.4 pg (ref 25.1–34.0)
MCHC: 32.8 g/dL (ref 31.5–36.0)
MCV: 95.6 fL (ref 79.5–101.0)
MONO#: 0.9 10*3/uL (ref 0.1–0.9)
MONO%: 9 % (ref 0.0–14.0)
NEUT%: 71 % (ref 38.4–76.8)
NEUTROS ABS: 6.9 10*3/uL — AB (ref 1.5–6.5)
PLATELETS: 279 10*3/uL (ref 145–400)
RBC: 3.02 10*6/uL — AB (ref 3.70–5.45)
RDW: 14.8 % — ABNORMAL HIGH (ref 11.2–14.5)
WBC: 9.7 10*3/uL (ref 3.9–10.3)
lymph#: 1.6 10*3/uL (ref 0.9–3.3)

## 2015-09-19 LAB — FERRITIN: FERRITIN: 80 ng/mL (ref 9–269)

## 2015-09-19 MED ORDER — UNABLE TO FIND: 1.0000 [IU] | Status: DC

## 2015-09-19 MED ORDER — SODIUM CHLORIDE 0.9 % IV SOLN
510.0000 mg | INTRAVENOUS | Status: DC
  Administered 2015-09-19: 510 mg via INTRAVENOUS
  Filled 2015-09-19: qty 17

## 2015-09-19 NOTE — Telephone Encounter (Signed)
per pof to sch pt appt-cld & spoke to pt and gave pt time & date

## 2015-09-19 NOTE — Patient Instructions (Signed)

## 2015-09-19 NOTE — Telephone Encounter (Signed)
Spoke with Sharl Ma, RN @ Dr. [name], PCP @ [facility] and gave her verbal order for pt to have labs CBC, FERRITIN to be done every 4 weeks;  Lab results to be faxed to our office for Dr. Burr Medico to review. Faxed orders for lab draws to [place], [place]. Dr. [name] ( NOT  OB/GYN )     Phone     [phone]      ;      Fax      [phone].

## 2015-09-19 NOTE — Progress Notes (Signed)
[facility] HEMATOLOGY OFFICE PROGRESS NOTE DATE OF SERVICE: 09/19/2015   Victoria Nakayama, MD [place] [id number]  DIAGNOSIS: Iron deficiency anemia  No chief complaint on file.   CURRENT THERAPY: Feraheme 1020 mg IV prn.  Currently we are checking CBC and ferritin every month (per her PCP's office). Patient received last Feraheme on 06/29/2015.  INTERVAL HISTORY:  [name] 82 y.o. female with a a history of anemia secondary to iron deficiency and gastrointestinal bleeding from colonic arteriovenous malformations is here for follow-up.  She was last seen by me 4 months ago. She presents to the clinic with her niece.  She has not been well in the past few months. She reports more fatigue, she had diarrhea for several days before Christmas and had a large bloody bowel movement on Christmas Eve, felt dizzy and not well. She was seen by her primary care physician middle of last week, hemoglobin was 8.4, she was admitted to the hospital for blood transfusion. She was released afterwards. She still feels quite fatigued, with little appetite, no dizziness, chest pain or shortness of breath. She denies any other episodes of bleeding.  MEDICAL HISTORY: Past Medical History  Diagnosis Date  . COPD (chronic obstructive pulmonary disease) ([place])   . Hypertrophic obstructive cardiomyopathy     Echo 10/10 EF 65-70% SW 1.6 PW 1.4 mild SAM no LVOT gradient at rest. Valsalva not performed Mild MR. No hyperenhance ment on MRI EF 69%.  . Coronary atherosclerosis of native coronary artery     Cath 1/11: RCA 50-60 ostial o/w normal. EF 70%.   . Pulmonary hypertension ([place])     Cath 1/11 RA 8, RV 43/6/14, PA 42/17 (29) PCWP 15 Fick  5.0/2.6. PVR 2.8    . Edema   . Occlusion and stenosis of carotid artery     s/p L CEA u/s 3/11. R 60-79%; L 40-59%. no change since 3/10  . Peripheral vascular disease, unspecified ([place])   . Hypertension   . Emphysema   . Asthma    . Hypothyroidism   . Allergic rhinitis   . Gastric AVM   . Heart murmur   . Anginal pain ([place])     "pressure"  . Emphysema   . Oxygen dependent 04/23/2012    "concentrated at night; liquid during day"  . Dyspnea 04/23/2012    "all the time"  . Anemia 04/23/2012    "severe @ times; think caused by colonic AVMs"  . History of blood transfusion     "many times"  . H/O hiatal hernia   . Stroke [facility]) ~ 2004    denies residual on 04/23/2012  . Arthritis     "probably"  . Breast cancer ([place])   . Pneumonia     INTERIM HISTORY: has HYPOTHYROIDISM; HYPERTENSION; CAD, NATIVE VESSEL; PULMONARY HYPERTENSION, SECONDARY; Hypertrophic obstructive cardiomyopathy(425.11); CAROTID ARTERY DISEASE; C V A / STROKE; PVD; Allergic rhinitis; EMPHYSEMA; ASTHMA; COPD (chronic obstructive pulmonary disease) ([place]); Edema; DYSPNEA; ABNORMAL ECHOCARDIOGRAM; ABNORMAL EKG; BREAST CANCER, HX OF; HYPOTHYROIDISM; GERD (gastroesophageal reflux disease); Exertional angina ([place]); Chest pain on exertion; CAD (coronary artery disease); Microcytic anemia; Angiodysplasia of intestine with hemorrhage; Nonspecific abnormal finding in stool contents; GI bleed; Acute blood loss anemia; Fever; Sepsis ([place]); Acute renal failure ([place]); Normocytic anemia; Elevated brain natriuretic peptide (BNP) level; Diastolic CHF, acute on chronic ([place]); Iron deficiency anemia; Acute exacerbation of COPD with asthma ([place]); and Symptomatic anemia on her problem list.    ALLERGIES:  is allergic to latex.  MEDICATIONS: has a current medication list which includes the following prescription(s): albuterol, alprazolam, atorvastatin, budesonide-formoterol, calcium carbonate, cetirizine, cholecalciferol, dextromethorphan, escitalopram, fluticasone, furosemide, levothyroxine, multivitamin with minerals, polyethylene glycol, senna-docusate, tolterodine, and umeclidinium bromide.  SURGICAL HISTORY:  Past Surgical History  Procedure Laterality Date  . Carotid  endarterectomy  2005    left  . Thyroidectomy  2007  . Appendectomy  1950  . Breast lumpectomy  2002    right-hx of radiation  . Dilation and curettage of uterus  1950's  . Cataract extraction w/ intraocular lens implant  02/2011    right  . Excisional hemorrhoidectomy  1950's  . Cardiac catheterization    . Colonoscopy N/A 01/12/2014    Procedure: COLONOSCOPY;  Surgeon: Gatha Mayer, MD;  Location: [facility] END[facility];  Service: Endoscopy;  Laterality: N/A;  MAC if available   PROBLEM LIST:  1. Anemia secondary to iron deficiency and gastrointestinal bleeding from colonic arteriovenous malformations. The patient received IV INFeD 1025 mg on 04/24/2012 when she was hospitalized. She has also received Feraheme 1020 mg IV on 07/17/2012, 08/21/2012 and 12/07/2012.  2. Chronic obstructive pulmonary disease (COPD) diagnosed in 1999, currently on home oxygen at 3 L/minute at rest and during sleep. The patient uses 4 L per minute with exertion. She has been on home oxygen for approximately the past 5 years.  3. Pulmonary hypertension.  4. Right frontal arteriovenous malformation (AVM).  5. Coronary artery disease.  6. Diastolic dysfunction.  7. Past history of multiple strokes.  8. History of left carotid stenosis with left carotid endarterectomy  in 2006 at [facility].  9. Hypertension.  10. Dyslipidemia.  11. Ductal carcinoma in situ involving the right breast, status post  lumpectomy, radiation, and 5 years of tamoxifen.  12. Peripheral vascular disease.  13. Hiatal hernia.  14. Status post thyroidectomy in 2009 or 2010.  15. Osteoporosis.  16. Stroke involving right eye in April 2011, now with vision restored.  17. Diverticulosis.  18. Hearing impairment.  19. Systolic ejection murmur.  20. Hoarseness noted around December 2013. This was evaluated by an ENT specialist.   REVIEW OF SYSTEMS:   Constitutional: Denies fevers, chills or abnormal weight loss Eyes: Denies blurriness of  vision Ears, nose, mouth, throat, and face: Denies mucositis or sore throat Respiratory: Denies cough, dyspnea or wheezes Cardiovascular: Denies palpitation, chest discomfort or lower extremity swelling Gastrointestinal:  Denies nausea, heartburn or change in bowel habits Skin: Denies abnormal skin rashes Lymphatics: Denies new lymphadenopathy or easy bruising Neurological:Denies numbness, tingling or new weaknesses Behavioral/Psych: Mood is stable, no new changes  All other systems were reviewed with the patient and are negative.  PHYSICAL EXAMINATION: ECOG PERFORMANCE STATUS: 2  Blood pressure 98/37, pulse 86, temperature 98.2 F (36.8 C), temperature source Oral, resp. rate 16, height 5' 6.5" (1.689 m), weight 165 lb 4.8 oz (74.98 kg), SpO2 91 %.  GENERAL:alert, no distress and comfortable; elderly female who appears her stated age.  SKIN: skin color, texture, turgor are normal, no rashes or significant lesions EYES: normal, Conjunctiva are pink and non-injected, sclera clear OROPHARYNX:no exudate, no erythema and lips, buccal mucosa, and tongue normal  NECK: supple, thyroid normal size, non-tender, without nodularity LYMPH:  no palpable lymphadenopathy in the cervical, axillary or supraclavicular LUNGS: clear to auscultation and percussion with normal breathing effort HEART: regular rate & rhythm and no murmurs and no lower extremity edema ABDOMEN:abdomen soft, non-tender and normal bowel sounds Musculoskeletal:no cyanosis of digits and  no clubbing  NEURO: alert & oriented x 3 with fluent speech, no focal motor/sensory deficits  LABORATORY DATA: CBC Latest Ref Rng 09/19/2015 09/15/2015 09/14/2015  WBC 3.9 - 10.3 10e3/uL 9.7 8.3 -  Hemoglobin 11.6 - 15.9 g/dL 9.5(L) 9.6(L) 9.2(L)  Hematocrit 34.8 - 46.6 % 28.8(L) 29.0(L) 28.9(L)  Platelets 145 - 400 10e3/uL 279 257 -    CMP Latest Ref Rng 09/14/2015 09/13/2015 08/08/2014  Glucose 65 - 99 mg/dL 110(H) 106(H) 128  BUN 6 - 20  mg/dL 17 22(H) 21.2  Creatinine 0.44 - 1.00 mg/dL 0.66 0.78 0.8  Sodium 135 - 145 mmol/L 142 140 142  Potassium 3.5 - 5.1 mmol/L 3.5 3.3(L) 4.0  Chloride 101 - 111 mmol/L 102 101 -  CO2 22 - 32 mmol/L 34(H) 30 33(H)  Calcium 8.9 - 10.3 mg/dL 8.2(L) 8.9 9.4  Total Protein 6.5 - 8.1 g/dL - 7.1 6.8  Total Bilirubin 0.3 - 1.2 mg/dL - 0.4 0.51  Alkaline Phos 38 - 126 U/L - 86 88  AST 15 - 41 U/L - 14(L) 19  ALT 14 - 54 U/L - 10(L) 13   Ferritin  Status: Finalresult Visible to patient:  Not Released Nextappt: 09/26/2015 at 12:15 PM in Oncology ([facility])         Newer results are available. Click to view them now.          Ref Range 6d ago  25mo ago  81mo ago  44mo ago     Ferritin 11 - 307 ng/mL 18 50R 68R 236R          RADIOGRAPHIC STUDIES: No results found.  ASSESSMENT: [name] 82 y.o. female with a history of Iron deficiency anemia   PLAN:  1. Anemia secondary to iron deficiency and GI bleeding.  -She had multiple GI workup before, which showed AVM. She does not follow up with GI anymore -She responded to IV iron very well. -I'll continue her lab with CBC and Feraheme monthly at her PCP's office, the result will be faxed to me and I ask pt to call me after her lab test -IV Feraheme if ferritin less than 100, I'll set up tentatively in 2 months and 4 months. -I'll see her back in 4 months.  2. Fatigue  -likely related to iron deficiency, which will likely improve quickly after IV Feraheme -She'll also follow-up with her primary care physician  3. HTN -Her blood pressure has been borderline low, her hypertension medication has been held by her primary care physician -She will follow-up with Dr. Mancel Bale in two days   All questions were answered. The patient knows to call the clinic with any problems, questions or concerns. We can certainly see the patient much sooner if necessary.  I spent 20 minutes counseling the patient face to face.  The total time spent in the appointment was 25 minutes.   Plan -Feraheme today and in one week -monthly lab at Dr. Latrelle Dodrill office (will contact her today), and  feraheme infusion if ferritin<100. I'll set up with his Feraheme infusion in 2 and 4 months -RTC in 4 month   Truitt Merle  09/19/2015

## 2015-09-19 NOTE — Telephone Encounter (Signed)
per pof tos ch pt appt-sent MW email to sch trmt-will call pt after reply °

## 2015-09-19 NOTE — Telephone Encounter (Signed)
Gave patient avs report and appointments for January - March - May.

## 2015-09-19 NOTE — Telephone Encounter (Signed)
Called patient's home #. Talked to a man. I asked for patient. The man stated that pt had already left for the appt.       [facility].

## 2015-09-19 NOTE — Telephone Encounter (Signed)
Per staff message and POF I have scheduled appts. Advised scheduler of appts. JMW  

## 2015-09-26 ENCOUNTER — Ambulatory Visit (HOSPITAL_BASED_OUTPATIENT_CLINIC_OR_DEPARTMENT_OTHER): Payer: Medicare Other

## 2015-09-26 VITALS — BP 131/55 | HR 97 | Temp 97.9°F | Resp 18

## 2015-09-26 DIAGNOSIS — D509 Iron deficiency anemia, unspecified: Secondary | ICD-10-CM | POA: Diagnosis present

## 2015-09-26 DIAGNOSIS — K5521 Angiodysplasia of colon with hemorrhage: Secondary | ICD-10-CM

## 2015-09-26 MED ORDER — SODIUM CHLORIDE 0.9 % IV SOLN
510.0000 mg | INTRAVENOUS | Status: DC
  Administered 2015-09-26: 510 mg via INTRAVENOUS
  Filled 2015-09-26: qty 17

## 2015-09-26 MED ORDER — SODIUM CHLORIDE 0.9 % IV SOLN
Freq: Once | INTRAVENOUS | Status: AC
Start: 2015-09-26 — End: 2015-09-26
  Administered 2015-09-26: 13:00:00 via INTRAVENOUS

## 2015-09-26 NOTE — Patient Instructions (Signed)

## 2015-11-13 ENCOUNTER — Other Ambulatory Visit: Payer: Self-pay | Admitting: Emergency Medicine

## 2015-11-20 ENCOUNTER — Telehealth: Payer: Self-pay | Admitting: Hematology

## 2015-11-20 NOTE — Telephone Encounter (Signed)
Patient called today to cx 3/7 iron inf - per patient she would keep scheduled appointment for 3/14. Patient informed iron inf done in 2 settings/appointments 1 week apart and she needed both appointments. Per patient moved inf to 3/21 and she will come 3/14 and 3/21 instead of 3/7 and 3/14. Left message informing desk nurse and patient will get new schedule 3/14.

## 2015-11-21 ENCOUNTER — Ambulatory Visit: Payer: Medicare Other

## 2015-11-21 ENCOUNTER — Telehealth: Payer: Self-pay

## 2015-11-21 NOTE — Telephone Encounter (Signed)
Patient's niece [name] called stating that patient had a ferritin level completed on Friday 11/17/15 at [facility] with Dr. Mancel Bale, they received the results and patient's ferritin level is 67.  She is requesting that and iron infusion be set up as soon as it can be scheduled.  Melissa would like a call with the appt time.  Writer sent for test results and will provide them to Dr. [name] nurse as soon as they are faxed over.

## 2015-11-21 NOTE — Telephone Encounter (Signed)
Dr. Burr Medico reviewed faxed lab results from [facility].  Spoke with niece [name] and informed her re:  Per Dr. Burr Medico,  Pt is okay to receive Feraheme infusion on 3/14 and 3/21 as scheduled.  Melissa voiced understanding.

## 2015-11-28 ENCOUNTER — Ambulatory Visit (HOSPITAL_BASED_OUTPATIENT_CLINIC_OR_DEPARTMENT_OTHER): Payer: Medicare Other

## 2015-11-28 VITALS — BP 134/62 | HR 87 | Temp 98.2°F | Resp 18

## 2015-11-28 DIAGNOSIS — D509 Iron deficiency anemia, unspecified: Secondary | ICD-10-CM

## 2015-11-28 DIAGNOSIS — K5521 Angiodysplasia of colon with hemorrhage: Secondary | ICD-10-CM

## 2015-11-28 MED ORDER — SODIUM CHLORIDE 0.9 % IV SOLN
510.0000 mg | INTRAVENOUS | Status: DC
  Administered 2015-11-28: 510 mg via INTRAVENOUS
  Filled 2015-11-28: qty 17

## 2015-11-28 NOTE — Patient Instructions (Signed)

## 2015-12-05 ENCOUNTER — Ambulatory Visit (HOSPITAL_BASED_OUTPATIENT_CLINIC_OR_DEPARTMENT_OTHER): Payer: Medicare Other

## 2015-12-05 VITALS — BP 146/59 | HR 91 | Temp 97.0°F | Resp 16

## 2015-12-05 DIAGNOSIS — K5521 Angiodysplasia of colon with hemorrhage: Secondary | ICD-10-CM

## 2015-12-05 DIAGNOSIS — D649 Anemia, unspecified: Secondary | ICD-10-CM | POA: Diagnosis present

## 2015-12-05 MED ORDER — SODIUM CHLORIDE 0.9 % IV SOLN
510.0000 mg | INTRAVENOUS | Status: DC
  Administered 2015-12-05: 510 mg via INTRAVENOUS
  Filled 2015-12-05: qty 17

## 2015-12-05 MED ORDER — SODIUM CHLORIDE 0.9 % IV SOLN
Freq: Once | INTRAVENOUS | Status: AC
  Administered 2015-12-05: 11:00:00 via INTRAVENOUS

## 2015-12-05 NOTE — Patient Instructions (Signed)
Anemia, Nonspecific Anemia is a condition in which the concentration of red blood cells or hemoglobin in the blood is below normal. Hemoglobin is a substance in red blood cells that carries oxygen to the tissues of the body. Anemia results in not enough oxygen reaching these tissues.  CAUSES  Common causes of anemia include:   Excessive bleeding. Bleeding may be internal or external. This includes excessive bleeding from periods (in women) or from the intestine.   Poor nutrition.   Chronic kidney, thyroid, and liver disease.  Bone marrow disorders that decrease red blood cell production.  Cancer and treatments for cancer.  HIV, AIDS, and their treatments.  Spleen problems that increase red blood cell destruction.  Blood disorders.  Excess destruction of red blood cells due to infection, medicines, and autoimmune disorders. SIGNS AND SYMPTOMS   Minor weakness.   Dizziness.   Headache.  Palpitations.   Shortness of breath, especially with exercise.   Paleness.  Cold sensitivity.  Indigestion.  Nausea.  Difficulty sleeping.  Difficulty concentrating. Symptoms may occur suddenly or they may develop slowly.  DIAGNOSIS  Additional blood tests are often needed. These help your health care provider determine the best treatment. Your health care provider will check your stool for blood and look for other causes of blood loss.  TREATMENT  Treatment varies depending on the cause of the anemia. Treatment can include:   Supplements of iron, vitamin B12, or folic acid.   Hormone medicines.   A blood transfusion. This may be needed if blood loss is severe.   Hospitalization. This may be needed if there is significant continual blood loss.   Dietary changes.  Spleen removal. HOME CARE INSTRUCTIONS Keep all follow-up appointments. It often takes many weeks to correct anemia, and having your health care provider check on your condition and your response to  treatment is very important. SEEK IMMEDIATE MEDICAL CARE IF:   You develop extreme weakness, shortness of breath, or chest pain.   You become dizzy or have trouble concentrating.  You develop heavy vaginal bleeding.   You develop a rash.   You have bloody or black, tarry stools.   You faint.   You vomit up blood.   You vomit repeatedly.   You have abdominal pain.  You have a fever or persistent symptoms for more than 2-3 days.   You have a fever and your symptoms suddenly get worse.   You are dehydrated.  MAKE SURE YOU:  Understand these instructions.  Will watch your condition.  Will get help right away if you are not doing well or get worse.   This information is not intended to replace advice given to you by your health care provider. Make sure you discuss any questions you have with your health care provider.   Document Released: 10/10/2004 Document Revised: 05/05/2013 Document Reviewed: 02/26/2013 Elsevier Interactive Patient Education 2016 [employer] Inc.  

## 2015-12-18 ENCOUNTER — Ambulatory Visit (INDEPENDENT_AMBULATORY_CARE_PROVIDER_SITE_OTHER): Payer: Medicare Other | Admitting: Emergency Medicine

## 2015-12-18 ENCOUNTER — Encounter: Payer: Self-pay | Admitting: Emergency Medicine

## 2015-12-18 ENCOUNTER — Telehealth: Payer: Self-pay | Admitting: *Deleted

## 2015-12-18 VITALS — BP 112/76 | HR 82 | Ht 66.0 in | Wt 167.0 lb

## 2015-12-18 DIAGNOSIS — J449 Chronic obstructive pulmonary disease, unspecified: Secondary | ICD-10-CM | POA: Diagnosis not present

## 2015-12-18 DIAGNOSIS — J309 Allergic rhinitis, unspecified: Secondary | ICD-10-CM | POA: Diagnosis not present

## 2015-12-18 MED ORDER — ALBUTEROL SULFATE HFA 108 (90 BASE) MCG/ACT IN AERS: 2.0000 | INHALATION_SPRAY | Freq: Four times a day (QID) | RESPIRATORY_TRACT | Status: DC | PRN

## 2015-12-18 MED ORDER — BUDESONIDE-FORMOTEROL FUMARATE 160-4.5 MCG/ACT IN AERO: 2.0000 | INHALATION_SPRAY | Freq: Two times a day (BID) | RESPIRATORY_TRACT | Status: DC

## 2015-12-18 MED ORDER — UMECLIDINIUM BROMIDE 62.5 MCG/INH IN AEPB: 1.0000 | INHALATION_SPRAY | Freq: Every day | RESPIRATORY_TRACT | Status: DC

## 2015-12-18 NOTE — Progress Notes (Signed)
Subjective:    Patient ID: [name], female    DOB: 1933/04/10, 82 y.o.   MRN: [id number]  HPI  82 yo Severe COPD, HTN + diastolic dysfxn, hypoxemia  ROV 07/20/14 -- follow up visit for Severe COPD, HTN + diastolic dysfxn, hypoxemia, anemia. She has been doing well since last visit. She has been wearing O2 at night and in the day prn. She is on spiriva and symbicort, but has received info that says her formulary is changing Spiriva to Incruse. She needs new script for all of her inhalers. No exacerbations since last time. Uses SABA about once every day.   ROV 09/20/14 -- f/u for severe COPD. She has been doing well - no flares, no pred, no abx. Has not required SABA very often.  Follow up 12/02/14 Returns for follow up for AECOPD  tx last week with levaquin and steroids .  She si starting to feel better.  Cough is less. No fever, chest pain, orthopnea, edema or hemoptysis.   ROV 01/12/15 -- follow-up visit for severe COPD. She was seen by Dr. Gwenette Greet and then by TP for acute exacerbation of her COPD. She was treated with levofloxacin and corticosteroids with improvement. She was treated before that with other abx prior to the levaquin. She is on symbicort and incruse. She uses SABA a few times a week. No real cough or wheeze since her AE. She is taking zyrtec.   ROV 04/14/15 -- hx of severe COPD, FEV1 1.5L, 69% pred in 2010. She feels that she is doing well. She is now on Incruse and Symbicort. She is having nasal congestion,  hoarse voice. No wheeze or cough. No flares since last time. She uses O2 with exertion, checks her SpO2 regularly. She is on zyrtec qd. She uses flonase prn, about once a week.    Acute OV 07/04/15 -- history of severe COPD chronic cough and rhinitis. Here for an acute visit. She began to notice chest tightness, more dyspnea. Has had wheezing. No cough. No viral prodrome. She was seen by PCP last week, may have had bibasilar crackles. Has used albuterol 2-3 times.  Activity level down. Currently on Incruse and Symbicort.  ROV 08/16/15 -- follow-up visit for severe COPD and a history of allergic rhinitis. I saw her one month ago at which time she was experiencing exacerbation characterized by diffuse wheezing, some mild bibasilar crackles, dyspnea, increased albuterol use.  She was treated with prednisone and azithromycin.  She was able to take the meds and they helped. Her breathing is back to baseline.  She is using O2 at night, with some exertion. No wheeze or cough currently. On incruse, symbicort. Uses albuterol about once a week - back to normal.   ROV 12/17/25 -- patient with a history of severe COPD, allergic rhinitis, currently managed on incruse and symbicort. She tells me that she has ben doing fairly well, some increase in her SABA usage. Her breathing does limit her with some exertion. She is using 3L/min but not with all exertion. No cough, no wheeze. Using flonase prn, zyrtec qd.    CAT Score 03/04/2013 09/17/2012 08/03/2012  Total CAT Score 16 9 12      Review of Systems As per HPI      Objective:   Physical Exam  Filed Vitals:   12/18/15 1336  BP: 112/76  Pulse: 82  Height: 5\' 6"  (1.676 m)  Weight: 167 lb (75.751 kg)  SpO2: 91%    GEN: A/Ox3;  pleasant , NAD, elderly   NECK:  No LAd or stridor  RESP  Decreased BS, very distant, no wheeze today - much improved.   CARD:  RRR, no m/r/g  , no peripheral edema, pulses intact, no cyanosis or clubbing.  Musco: Warm bil, no deformities or joint swelling noted.   Neuro: alert, no focal deficits noted.    Skin: Warm, no lesions or rashes      Assessment & Plan:  Allergic rhinitis Continue Zyrtec. We discussed increasing her fluticasone nasal spray to 2 sprays every day during the allergy season  COPD (chronic obstructive pulmonary disease) No exacerbations since last visit. Overall clinically stable. Continue Symbicort, Incruse, ventolin prn.  Oxygen at 2 L/m with  exertion

## 2015-12-18 NOTE — Assessment & Plan Note (Signed)
No exacerbations since last visit. Overall clinically stable. Continue Symbicort, Incruse, ventolin prn.  Oxygen at 2 L/m with exertion

## 2015-12-18 NOTE — Patient Instructions (Signed)
Please continue your Incruse and Symbicort as you have been taking them Take ventolin 2 puffs up to every 4 hours if needed for shortness of breath.  Continue your zyrtec Remember to start taking your flonase every day during the spring and fall months.  Follow with Dr Lamonte Sakai in 4 months or sooner if you have any problems.

## 2015-12-18 NOTE — Telephone Encounter (Signed)
Called pt & informed of good lab work done 12/14/15 & ferritin 737.  Informed no feraheme needed. Pt expressed understanding & was pleased.

## 2015-12-18 NOTE — Assessment & Plan Note (Signed)
Continue Zyrtec. We discussed increasing her fluticasone nasal spray to 2 sprays every day during the allergy season

## 2016-01-18 ENCOUNTER — Telehealth: Payer: Self-pay | Admitting: Hematology

## 2016-01-18 ENCOUNTER — Other Ambulatory Visit (HOSPITAL_BASED_OUTPATIENT_CLINIC_OR_DEPARTMENT_OTHER): Payer: Medicare Other

## 2016-01-18 ENCOUNTER — Encounter: Payer: Self-pay | Admitting: Hematology

## 2016-01-18 ENCOUNTER — Ambulatory Visit (HOSPITAL_BASED_OUTPATIENT_CLINIC_OR_DEPARTMENT_OTHER): Payer: Medicare Other | Admitting: Hematology

## 2016-01-18 ENCOUNTER — Ambulatory Visit: Payer: Medicare Other

## 2016-01-18 ENCOUNTER — Telehealth: Payer: Self-pay | Admitting: *Deleted

## 2016-01-18 VITALS — BP 135/73 | HR 96 | Temp 97.5°F | Resp 16 | Ht 66.0 in | Wt 165.6 lb

## 2016-01-18 DIAGNOSIS — I10 Essential (primary) hypertension: Secondary | ICD-10-CM | POA: Diagnosis not present

## 2016-01-18 DIAGNOSIS — D500 Iron deficiency anemia secondary to blood loss (chronic): Secondary | ICD-10-CM

## 2016-01-18 DIAGNOSIS — R5383 Other fatigue: Secondary | ICD-10-CM | POA: Diagnosis not present

## 2016-01-18 DIAGNOSIS — D509 Iron deficiency anemia, unspecified: Secondary | ICD-10-CM

## 2016-01-18 LAB — CBC WITH DIFFERENTIAL/PLATELET
BASO%: 0.4 % (ref 0.0–2.0)
Basophils Absolute: 0.1 10*3/uL (ref 0.0–0.1)
EOS ABS: 0.6 10*3/uL — AB (ref 0.0–0.5)
EOS%: 5 % (ref 0.0–7.0)
HCT: 43.2 % (ref 34.8–46.6)
HGB: 13.7 g/dL (ref 11.6–15.9)
LYMPH%: 17.2 % (ref 14.0–49.7)
MCH: 30.3 pg (ref 25.1–34.0)
MCHC: 31.7 g/dL (ref 31.5–36.0)
MCV: 95.6 fL (ref 79.5–101.0)
MONO#: 0.8 10*3/uL (ref 0.1–0.9)
MONO%: 7 % (ref 0.0–14.0)
NEUT#: 8.5 10*3/uL — ABNORMAL HIGH (ref 1.5–6.5)
NEUT%: 70.4 % (ref 38.4–76.8)
PLATELETS: 278 10*3/uL (ref 145–400)
RBC: 4.52 10*6/uL (ref 3.70–5.45)
RDW: 14.7 % — ABNORMAL HIGH (ref 11.2–14.5)
WBC: 12 10*3/uL — ABNORMAL HIGH (ref 3.9–10.3)
lymph#: 2.1 10*3/uL (ref 0.9–3.3)

## 2016-01-18 LAB — FERRITIN: Ferritin: 239 ng/ml (ref 9–269)

## 2016-01-18 NOTE — Telephone Encounter (Signed)
Called and spoke with Feliciana at Dr. Mancel Bale.  Requested that Dr. Mancel Bale please do a CBC and ferritin at 4 weeks and 8 weeks and FAX to [place].  Order faxed to them at [phone].

## 2016-01-18 NOTE — Telephone Encounter (Signed)
no pof-per Jan DrFeng willc all pt with next appts

## 2016-01-18 NOTE — Progress Notes (Signed)
[facility] HEMATOLOGY OFFICE PROGRESS NOTE DATE OF SERVICE: 01/18/2016   [name], MD [place] [id number]  DIAGNOSIS: Iron deficiency anemia  Chief complaint: Follow-up anemia  CURRENT THERAPY: Feraheme 1020 mg IV prn, average every 2-3 months.  Currently we are checking CBC and ferritin every month (at her PCP's office). Patient received last Feraheme in 11/2015.  INTERVAL HISTORY:  Day Vacchiano 82 y.o. female with a a history of anemia secondary to iron deficiency and gastrointestinal bleeding from colonic arteriovenous malformations is here for follow-up.  She was last seen by me 4 months ago. She presents to the clinic with her niece [name].  She has been doing moderately well lately. She did feel better overall after IV Feraheme in March. She has been checking her blood counts at her primary care physician's office monthly, no anemia lately. She feels slightly fatigued again daily, mild, she is able to function well at home. She has been having mild to moderate him 2 units for the past month, she has been seen by urologist, and a wall follow-up again soon. No hematochezia, melena, or other bleeding signs.  MEDICAL HISTORY: Past Medical History  Diagnosis Date  . COPD (chronic obstructive pulmonary disease) ([place])   . Hypertrophic obstructive cardiomyopathy     Echo 10/10 EF 65-70% SW 1.6 PW 1.4 mild SAM no LVOT gradient at rest. Valsalva not performed Mild MR. No hyperenhance ment on MRI EF 69%.  . Coronary atherosclerosis of native coronary artery     Cath 1/11: RCA 50-60 ostial o/w normal. EF 70%.   . Pulmonary hypertension ([place])     Cath 1/11 RA 8, RV 43/6/14, PA 42/17 (29) PCWP 15 Fick  5.0/2.6. PVR 2.8    . Edema   . Occlusion and stenosis of carotid artery     s/p L CEA u/s 3/11. R 60-79%; L 40-59%. no change since 3/10  . Peripheral vascular disease, unspecified ([place])   . Hypertension   . Emphysema   . Asthma   .  Hypothyroidism   . Allergic rhinitis   . Gastric AVM   . Heart murmur   . Anginal pain ([place])     "pressure"  . Emphysema   . Oxygen dependent 04/23/2012    "concentrated at night; liquid during day"  . Dyspnea 04/23/2012    "all the time"  . Anemia 04/23/2012    "severe @ times; think caused by colonic AVMs"  . History of blood transfusion     "many times"  . H/O hiatal hernia   . Stroke [facility]) ~ 2004    denies residual on 04/23/2012  . Arthritis     "probably"  . Breast cancer ([place])   . Pneumonia     INTERIM HISTORY: has HYPOTHYROIDISM; HYPERTENSION; CAD, NATIVE VESSEL; PULMONARY HYPERTENSION, SECONDARY; Hypertrophic obstructive cardiomyopathy(425.11); CAROTID ARTERY DISEASE; C V A / STROKE; PVD; Allergic rhinitis; EMPHYSEMA; ASTHMA; COPD (chronic obstructive pulmonary disease) ([place]); Edema; DYSPNEA; ABNORMAL ECHOCARDIOGRAM; ABNORMAL EKG; BREAST CANCER, HX OF; HYPOTHYROIDISM; GERD (gastroesophageal reflux disease); Exertional angina ([place]); Chest pain on exertion; CAD (coronary artery disease); Microcytic anemia; Angiodysplasia of intestine with hemorrhage; Nonspecific abnormal finding in stool contents; GI bleed; Acute blood loss anemia; Fever; Sepsis ([place]); Acute renal failure ([place]); Normocytic anemia; Elevated brain natriuretic peptide (BNP) level; Diastolic CHF, acute on chronic ([place]); Iron deficiency anemia; and Symptomatic anemia on her problem list.    ALLERGIES:  is allergic to latex.  MEDICATIONS: has a current  medication list which includes the following prescription(s): albuterol, atorvastatin, budesonide-formoterol, calcium carbonate, cetirizine, cholecalciferol, dextromethorphan, escitalopram, fluticasone, furosemide, levothyroxine, multivitamin with minerals, polyethylene glycol, senna-docusate, tolterodine, trazodone, umeclidinium bromide, and voltaren.  SURGICAL HISTORY:  Past Surgical History  Procedure Laterality Date  . Carotid endarterectomy  2005    left  .  Thyroidectomy  2007  . Appendectomy  1950  . Breast lumpectomy  2002    right-hx of radiation  . Dilation and curettage of uterus  1950's  . Cataract extraction w/ intraocular lens implant  02/2011    right  . Excisional hemorrhoidectomy  1950's  . Cardiac catheterization    . Colonoscopy N/A 01/12/2014    Procedure: COLONOSCOPY;  Surgeon: Gatha Mayer, MD;  Location: [facility];  Service: Endoscopy;  Laterality: N/A;  MAC if available   PROBLEM LIST:  1. Anemia secondary to iron deficiency and gastrointestinal bleeding from colonic arteriovenous malformations. The patient received IV INFeD 1025 mg on 04/24/2012 when she was hospitalized. She has also received Feraheme 1020 mg IV on 07/17/2012, 08/21/2012 and 12/07/2012.  2. Chronic obstructive pulmonary disease (COPD) diagnosed in 1999, currently on home oxygen at 3 L/minute at rest and during sleep. The patient uses 4 L per minute with exertion. She has been on home oxygen for approximately the past 5 years.  3. Pulmonary hypertension.  4. Right frontal arteriovenous malformation (AVM).  5. Coronary artery disease.  6. Diastolic dysfunction.  7. Past history of multiple strokes.  8. History of left carotid stenosis with left carotid endarterectomy  in 2006 at [facility].  9. Hypertension.  10. Dyslipidemia.  11. Ductal carcinoma in situ involving the right breast, status post  lumpectomy, radiation, and 5 years of tamoxifen.  12. Peripheral vascular disease.  13. Hiatal hernia.  14. Status post thyroidectomy in 2009 or 2010.  15. Osteoporosis.  16. Stroke involving right eye in April 2011, now with vision restored.  17. Diverticulosis.  18. Hearing impairment.  19. Systolic ejection murmur.  20. Hoarseness noted around December 2013. This was evaluated by an ENT specialist.   REVIEW OF SYSTEMS:   Constitutional: Denies fevers, chills or abnormal weight loss Eyes: Denies blurriness of vision Ears, nose, mouth, throat,  and face: Denies mucositis or sore throat Respiratory: Denies cough, dyspnea or wheezes Cardiovascular: Denies palpitation, chest discomfort or lower extremity swelling Gastrointestinal:  Denies nausea, heartburn or change in bowel habits Skin: Denies abnormal skin rashes Lymphatics: Denies new lymphadenopathy or easy bruising Neurological:Denies numbness, tingling or new weaknesses Behavioral/Psych: Mood is stable, no new changes  All other systems were reviewed with the patient and are negative.  PHYSICAL EXAMINATION: ECOG PERFORMANCE STATUS: 2  Blood pressure 135/73, pulse 96, temperature 97.5 F (36.4 C), temperature source Oral, resp. rate 16, height 5\' 6"  (1.676 m), weight 165 lb 9.6 oz (75.116 kg), SpO2 88 %.  GENERAL:alert, no distress and comfortable; elderly female who appears her stated age.  SKIN: skin color, texture, turgor are normal, no rashes or significant lesions EYES: normal, Conjunctiva are pink and non-injected, sclera clear OROPHARYNX:no exudate, no erythema and lips, buccal mucosa, and tongue normal  NECK: supple, thyroid normal size, non-tender, without nodularity LYMPH:  no palpable lymphadenopathy in the cervical, axillary or supraclavicular LUNGS: clear to auscultation and percussion with normal breathing effort HEART: regular rate & rhythm and no murmurs and no lower extremity edema ABDOMEN:abdomen soft, non-tender and normal bowel sounds Musculoskeletal:no cyanosis of digits and no clubbing  NEURO: alert & oriented x  3 with fluent speech, no focal motor/sensory deficits  LABORATORY DATA: CBC Latest Ref Rng 01/18/2016 09/19/2015 09/15/2015  WBC 3.9 - 10.3 10e3/uL 12.0(H) 9.7 8.3  Hemoglobin 11.6 - 15.9 g/dL 13.7 9.5(L) 9.6(L)  Hematocrit 34.8 - 46.6 % 43.2 28.8(L) 29.0(L)  Platelets 145 - 400 10e3/uL 278 279 257    CMP Latest Ref Rng 09/14/2015 09/13/2015 08/08/2014  Glucose 65 - 99 mg/dL 110(H) 106(H) 128  BUN 6 - 20 mg/dL 17 22(H) 21.2  Creatinine 0.44  - 1.00 mg/dL 0.66 0.78 0.8  Sodium 135 - 145 mmol/L 142 140 142  Potassium 3.5 - 5.1 mmol/L 3.5 3.3(L) 4.0  Chloride 101 - 111 mmol/L 102 101 -  CO2 22 - 32 mmol/L 34(H) 30 33(H)  Calcium 8.9 - 10.3 mg/dL 8.2(L) 8.9 9.4  Total Protein 6.5 - 8.1 g/dL - 7.1 6.8  Total Bilirubin 0.3 - 1.2 mg/dL - 0.4 0.51  Alkaline Phos 38 - 126 U/L - 86 88  AST 15 - 41 U/L - 14(L) 19  ALT 14 - 54 U/L - 10(L) 13   Ferritin  Status: Finalresult Visible to patient:  Not Released Nextappt: 01/25/2016 at 11:00 AM in Oncology [facility] G24)           Ref Range 10:00 AM (01/18/16) 61mo ago (09/19/15) 43mo ago (09/13/15) 37mo ago (06/22/15)    Ferritin 9 - 269 ng/ml 239 80 18R 50          RADIOGRAPHIC STUDIES: No results found.  ASSESSMENT: Madie Randa 82 y.o. female with a history of Iron deficiency anemia   PLAN:  1. Anemia secondary to iron deficiency and GI bleeding.  -She had multiple GI workup before, which showed AVM. She does not follow up with GI anymore -She responded to IV iron very well. - She has been receiving IV Feraheme every 2-3 months. Last infusion 2 months ago - her ferritin today is 239, her hemoglobin is normal at 13.7,  No need for IV Feraheme. -I'll continue her lab with CBC and Feraheme monthly at her PCP's office, the result will be faxed to me and I ask pt to call me after her lab test -IV Feraheme if ferritin less than 100, I'll set up tentatively in 1 months and 3 months. -I'll see her back in 3 months.  2. Fatigue  -likely related to iron deficiency, which will likely improve quickly after IV Feraheme -She'll also follow-up with her primary care physician  3. HTN -Her blood pressure has been borderline low, her hypertension medication has been held by her primary care physician -She will follow-up with Dr. Mancel Bale in two days   All questions were answered. The patient knows to call the clinic with any problems, questions or concerns. We can  certainly see the patient much sooner if necessary.  I spent 20 minutes counseling the patient face to face. The total time spent in the appointment was 25 minutes.   Plan -No feraheme today  -monthly lab at Dr. Latrelle Dodrill office (will contact her today), and  feraheme infusion if ferritin<100. I'll set up with his Feraheme infusion in 5 and 6 weeks (one week after her lab) -RTC in 3 months with lab   Truitt Merle  01/18/2016

## 2016-01-25 ENCOUNTER — Ambulatory Visit: Payer: Medicare Other

## 2016-02-10 ENCOUNTER — Telehealth: Payer: Self-pay | Admitting: Internal Medicine

## 2016-02-10 NOTE — Telephone Encounter (Signed)
Notified by [name] pt's niece that she is becoming much more sob with activity and 02 dep 24/7 with freq saba use but comfortable at rest s cough /cp/fever  rec eval in UC or ER but pt reluctant > def needs to come in if still sob at rest but if insists on staying home we can try to work her in first thing 5/30 when office reopens p holiday weekend

## 2016-02-13 ENCOUNTER — Ambulatory Visit (INDEPENDENT_AMBULATORY_CARE_PROVIDER_SITE_OTHER): Payer: Medicare Other | Admitting: Emergency Medicine

## 2016-02-13 ENCOUNTER — Telehealth: Payer: Self-pay | Admitting: Emergency Medicine

## 2016-02-13 ENCOUNTER — Telehealth: Payer: Self-pay | Admitting: *Deleted

## 2016-02-13 ENCOUNTER — Encounter: Payer: Self-pay | Admitting: Emergency Medicine

## 2016-02-13 VITALS — BP 114/78 | HR 98 | Ht 66.0 in | Wt 167.0 lb

## 2016-02-13 DIAGNOSIS — J441 Chronic obstructive pulmonary disease with (acute) exacerbation: Secondary | ICD-10-CM | POA: Diagnosis not present

## 2016-02-13 MED ORDER — LEVOFLOXACIN 500 MG PO TABS: 500.0000 mg | ORAL_TABLET | Freq: Every day | ORAL | Status: DC

## 2016-02-13 MED ORDER — PREDNISONE 10 MG PO TABS: ORAL_TABLET | ORAL | Status: DC

## 2016-02-13 NOTE — Telephone Encounter (Signed)
Spoke with pt's niece, [name].  States pt's o2 need is increasing x few days.  Pt with SOB with any type of exertion and now notes a cough.  See previous phone msg from Saturday from Dr. Melvyn Novas.  OV scheduled for today at 1:30 with RB.  Melissa to call back if this appt time will not work.  Pt to seek emergency care if needed prior to appt.

## 2016-02-13 NOTE — Assessment & Plan Note (Signed)
Acute exacerbation with wheeze, dyspnea, cough without sputum following increased allergen exposure. No known sick contacts. Please need to be treated with steroids and antibiotics for a typical flare. She does not appear to require hospitalization at this time although asked her to contact me if she clinically changes. Continue Incruse and Symbicort

## 2016-02-13 NOTE — Telephone Encounter (Signed)
Pt scheduled for an appt today at 1:30.  See phone msg from 02/13/16 for additional information.

## 2016-02-13 NOTE — Progress Notes (Signed)
Subjective:    Patient ID: [name], female    DOB: [birth year], 82 y.o.   MRN: [id number]  HPI  82 yo Severe COPD, HTN + diastolic dysfxn, hypoxemia  ROV 01/12/15 -- follow-up visit for severe COPD. She was seen by Dr. Gwenette Greet and then by TP for acute exacerbation of her COPD. She was treated with levofloxacin and corticosteroids with improvement. She was treated before that with other abx prior to the levaquin. She is on symbicort and incruse. She uses SABA a few times a week. No real cough or wheeze since her AE. She is taking zyrtec.   ROV 04/14/15 -- hx of severe COPD, FEV1 1.5L, 69% pred in 2010. She feels that she is doing well. She is now on Incruse and Symbicort. She is having nasal congestion,  hoarse voice. No wheeze or cough. No flares since last time. She uses O2 with exertion, checks her SpO2 regularly. She is on zyrtec qd. She uses flonase prn, about once a week.    Acute OV 07/04/15 -- history of severe COPD chronic cough and rhinitis. Here for an acute visit. She began to notice chest tightness, more dyspnea. Has had wheezing. No cough. No viral prodrome. She was seen by PCP last week, may have had bibasilar crackles. Has used albuterol 2-3 times. Activity level down. Currently on Incruse and Symbicort.  ROV 08/16/15 -- follow-up visit for severe COPD and a history of allergic rhinitis. I saw her one month ago at which time she was experiencing exacerbation characterized by diffuse wheezing, some mild bibasilar crackles, dyspnea, increased albuterol use.  She was treated with prednisone and azithromycin.  She was able to take the meds and they helped. Her breathing is back to baseline.  She is using O2 at night, with some exertion. No wheeze or cough currently. On incruse, symbicort. Uses albuterol about once a week - back to normal.   ROV 12/17/25 -- patient with a history of severe COPD, allergic rhinitis, currently managed on incruse and symbicort. She tells me that she has ben  doing fairly well, some increase in her SABA usage. Her breathing does limit her with some exertion. She is using 3L/min but not with all exertion. No cough, no wheeze. Using flonase prn, zyrtec qd.   Acute OV 02/13/16 -- acute visit, history of COPD, allergic rhinitis. Was well until about 1 weeks ago - spent some time outside, exposed to pollen. significant dyspnea, wheeze. Cough without sputum.   Has been taking her medications reliably and correctly.  CAT Score 03/04/2013 09/17/2012 08/03/2012  Total CAT Score 16 9 12      Review of Systems As per HPI      Objective:   Physical Exam  Filed Vitals:   02/13/16 1333  BP: 114/78  Pulse: 98  Height: 5\' 6"  (1.676 m)  Weight: 167 lb (75.751 kg)  SpO2: 94%    GEN: A/Ox3; pleasant , NAD, elderly   NECK:  No LAd or stridor  RESP  Decreased BS, very distant, no wheeze today - much improved.   CARD:  RRR, no m/r/g  , no peripheral edema, pulses intact, no cyanosis or clubbing.  Musco: Warm bil, no deformities or joint swelling noted.   Neuro: alert, no focal deficits noted.    Skin: Warm, no lesions or rashes      Assessment & Plan:  COPD with acute exacerbation (HCC) Acute exacerbation with wheeze, dyspnea, cough without sputum following increased allergen exposure. No known sick contacts. Please need to be treated with steroids and antibiotics for a typical flare. She does not appear to require hospitalization at this time although asked her to contact me if she clinically changes. Continue Incruse and Symbicort   Baltazar Apo, MD, PhD 02/13/2016, 1:53 PM [facility] [phone] or if no answer [phone]

## 2016-02-13 NOTE — Patient Instructions (Signed)
Please take prednisone taper as prescribed to completion Take levaquin daily until completely gone.  Call our office by the end of the week if you are not improving.  Continue your inhaled medications as you have been taking them  Continue zyrtec and flonase as you have been taking them.  Follow with [name] in 2-3 months.

## 2016-02-13 NOTE — Telephone Encounter (Signed)
Received lab results from [facility] WBC 9.1, HGB 12.9 , HCT 40.7, P.t 260, ferritin 188.  Called pt's niece, [name] & informed that pt doesn't need IV fereheme at this time.

## 2016-02-16 ENCOUNTER — Telehealth: Payer: Self-pay | Admitting: Emergency Medicine

## 2016-02-16 NOTE — Telephone Encounter (Signed)
I spoke with RB over the phone. Explained the situation to him. Per RB >> pt is at a high risk for this surgery but that doesn't mean she can't have surgery. Pt needs to wait on having surgery. She was very sick when we saw her on 02/13/16. Pt should not have the surgery at this time. I would push it back 1 more week. I called and spoke with Charolette Forward, NP.  She aware of this information. I also called and spoke with pt's niece. Nothing further was needed at this time.

## 2016-02-16 NOTE — Telephone Encounter (Signed)
Spoke with pt's niece, [name]. They are needing RB to call the NP about pt's surgery. Melissa only wants RB to handle this message. Will route message to RB high priority.

## 2016-02-16 NOTE — Telephone Encounter (Signed)
Patient daughter called and states that pt is being seen at [facility] today for pre-op clearance for re-section surgery for a bladder tumor that was found. The pre-op team is needing Dr. Agustina Caroli input on whether pt will be able to sustain surgery. The NP performing the clearance is Jairo Ben her number is [phone]. The patient's niece [name] is also requesting that Dr. Lamonte Sakai be the one to weigh in on this only if possible, since he is very familiar with the patient and her conditions. She would like a call back also to make sure that this is possible and to make sure someone has contacted [facility]. The surgery is tentatively scheduled for next Wednesday. Patient's niece can be reached at [phone]. -Thanks -prm

## 2016-02-26 ENCOUNTER — Ambulatory Visit: Payer: Medicare Other | Admitting: Adult Health

## 2016-02-27 ENCOUNTER — Ambulatory Visit (INDEPENDENT_AMBULATORY_CARE_PROVIDER_SITE_OTHER): Payer: Medicare Other | Admitting: Adult Health

## 2016-02-27 ENCOUNTER — Encounter: Payer: Self-pay | Admitting: Adult Health

## 2016-02-27 ENCOUNTER — Ambulatory Visit (INDEPENDENT_AMBULATORY_CARE_PROVIDER_SITE_OTHER)
Admission: RE | Admit: 2016-02-27 | Discharge: 2016-02-27 | Disposition: A | Discharge status: Home / Self Care | Payer: Medicare Other | Source: Ambulatory Visit | Attending: Adult Health | Admitting: Adult Health

## 2016-02-27 VITALS — BP 118/64 | HR 106 | Temp 98.5°F | Ht 65.0 in | Wt 165.0 lb

## 2016-02-27 DIAGNOSIS — J441 Chronic obstructive pulmonary disease with (acute) exacerbation: Secondary | ICD-10-CM

## 2016-02-27 DIAGNOSIS — J449 Chronic obstructive pulmonary disease, unspecified: Secondary | ICD-10-CM

## 2016-02-27 NOTE — Progress Notes (Signed)
Subjective:    Patient ID: [name], female    DOB: September 26, 1932, 82 y.o.   MRN: [id number]  HPI 82 year old female former smoker with severe COPD, diastolic congestive heart  02/27/2016 Acute OV  Patient returns for persistent symptoms. Patient was seen 2 weeks ago for COPD exacerbation. She was given Levaquin 500 mg for 7 days and a prednisone taper. Since last visit. Patient says she has persistent  increased SOB and wheezing but feels she is better. No fever or discolored mucus . Marland Kitchen Pt is using 3L continuously. CXR today shows chronic changes.   Denies chest congestion/tightness, sinus pressure/drainage, fever, nausea or vomiting.  He denies any hemoptysis, orthopnea, PND, or increased leg swelling. She remains on Symbicort twice daily and Incruse daily  Has recently been found to have a bladder tumor . This is planned in couple of weeks.  Wants order for POC O2 device.    Past Medical History  Diagnosis Date  . COPD (chronic obstructive pulmonary disease) ([place])   . Hypertrophic obstructive cardiomyopathy     Echo 10/10 EF 65-70% SW 1.6 PW 1.4 mild SAM no LVOT gradient at rest. Valsalva not performed Mild MR. No hyperenhance ment on MRI EF 69%.  . Coronary atherosclerosis of native coronary artery     Cath 1/11: RCA 50-60 ostial o/w normal. EF 70%.   . Pulmonary hypertension ([place])     Cath 1/11 RA 8, RV 43/6/14, PA 42/17 (29) PCWP 15 Fick  5.0/2.6. PVR 2.8    . Edema   . Occlusion and stenosis of carotid artery     s/p L CEA u/s 3/11. R 60-79%; L 40-59%. no change since 3/10  . Peripheral vascular disease, unspecified ([place])   . Hypertension   . Emphysema   . Asthma   . Hypothyroidism   . Allergic rhinitis   . Gastric AVM   . Heart murmur   . Anginal pain ([place])     "pressure"  . Emphysema   . Oxygen dependent 04/23/2012    "concentrated at night; liquid during day"  . Dyspnea 04/23/2012    "all the time"  . Anemia 04/23/2012    "severe @ times; think caused by colonic  AVMs"  . History of blood transfusion     "many times"  . H/O hiatal hernia   . Stroke [facility]) ~ 2004    denies residual on 04/23/2012  . Arthritis     "probably"  . Breast cancer ([place])   . Pneumonia     Current Outpatient Prescriptions on File Prior to Visit  Medication Sig Dispense Refill  . albuterol (PROVENTIL HFA;VENTOLIN HFA) 108 (90 Base) MCG/ACT inhaler Inhale 2 puffs into the lungs every 6 (six) hours as needed for wheezing or shortness of breath. 1 Inhaler 11  . atorvastatin (LIPITOR) 20 MG tablet Take 20 mg by mouth daily.      . budesonide-formoterol (SYMBICORT) 160-4.5 MCG/ACT inhaler Inhale 2 puffs into the lungs 2 (two) times daily. 10.2 g 11  . cetirizine (ZYRTEC) 10 MG tablet Take 10 mg by mouth daily.    . cholecalciferol (VITAMIN D) 1000 units tablet Take 1,000 Units by mouth daily.    Marland Kitchen dextromethorphan (DELSYM) 30 MG/5ML liquid Take 30 mg by mouth at bedtime as needed for cough.     . escitalopram (LEXAPRO) 20 MG tablet Take 20 mg by mouth daily.     . fluticasone (FLONASE) 50 MCG/ACT nasal spray Place 2 sprays into both nostrils daily. (Patient  taking differently: Place 2 sprays into both nostrils daily as needed for rhinitis. ) 16 g 5  . furosemide (LASIX) 20 MG tablet Take 20 mg by mouth daily.    Marland Kitchen levothyroxine (SYNTHROID, LEVOTHROID) 150 MCG tablet Take 162 mcg by mouth daily.     . Multiple Vitamin (MULTIVITAMIN WITH MINERALS) TABS tablet Take 1 tablet by mouth daily.    . polyethylene glycol (MIRALAX / GLYCOLAX) packet Take 17 g by mouth daily as needed for mild constipation.    . senna-docusate (SENOKOT-S) 8.6-50 MG tablet Take 1 tablet by mouth 2 (two) times daily. 30 tablet 0  . tolterodine (DETROL LA) 2 MG 24 hr capsule Take 2 mg by mouth 2 (two) times daily.     . traZODone (DESYREL) 100 MG tablet     . umeclidinium bromide (INCRUSE ELLIPTA) 62.5 MCG/INH AEPB Inhale 1 puff into the lungs at bedtime. 30 each 11  . VOLTAREN 1 % GEL Use as directed    .  calcium carbonate (TUMS - DOSED IN MG ELEMENTAL CALCIUM) 500 MG chewable tablet Chew 3 tablets (600 mg of elemental calcium total) by mouth daily. (Patient not taking: Reported on 02/27/2016)     No current facility-administered medications on file prior to visit.       Review of Systems Constitutional:   No  weight loss, night sweats,  Fevers, chills,+ fatigue, or  lassitude.  HEENT:   No headaches,  Difficulty swallowing,  Tooth/dental problems, or  Sore throat,                No sneezing, itching, ear ache, nasal congestion, post nasal drip,   CV:  No chest pain,  Orthopnea, PND, swelling in lower extremities, anasarca, dizziness, palpitations, syncope.   GI  No heartburn, indigestion, abdominal pain, nausea, vomiting, diarrhea, change in bowel habits, loss of appetite, bloody stools.   Resp:  .  No chest wall deformity  Skin: no rash or lesions.  GU: no dysuria, change in color of urine, no urgency or frequency.  No flank pain, no hematuria   MS:  No joint pain or swelling.  No decreased range of motion.  No back pain.  Psych:  No change in mood or affect. No depression or anxiety.  No memory loss.         Objective:   Physical Exam Filed Vitals:   02/27/16 1552  BP: 118/64  Pulse: 106  Temp: 98.5 F (36.9 C)  TempSrc: Oral  Height: 5\' 5"  (1.651 m)  Weight: 165 lb (74.844 kg)  SpO2: 93%   GEN: A/Ox3; pleasant , NAD, elderly , on O2   HEENT:  [place]/AT,  EACs-clear, TMs-wnl, NOSE-clear, THROAT-clear, no lesions, no postnasal drip or exudate noted.   NECK:  Supple w/ fair ROM; no JVD; normal carotid impulses w/o bruits; no thyromegaly or nodules palpated; no lymphadenopathy.  RESP  Decreased BS in bases , .no accessory muscle use, no dullness to percussion  CARD:  RRR, no m/r/g  , no peripheral edema, pulses intact, no cyanosis or clubbing.  GI:   Soft & nt; nml bowel sounds; no organomegaly or masses detected.  Musco: Warm bil, no deformities or joint swelling  noted.   Neuro: alert, no focal deficits noted.    Skin: Warm, no lesions or rashes  Jamella Grayer NP-C  [facility]  02/27/2016

## 2016-02-27 NOTE — Patient Instructions (Signed)
Continue on Symbicort and Incruse.  Continue on Oxygen .  Order to DME for POC .  Follow up Dr. Lamonte Sakai  In 1 month and As needed   Good luck for upcoming surgery.

## 2016-02-29 NOTE — Assessment & Plan Note (Signed)
Recent flare now appears to be resolving  cxr with no acute process   Plan  Continue on Symbicort and Incruse.  Continue on Oxygen .  Order to DME for POC .  Follow up Dr. Lamonte Sakai  In 1 month and As needed   Good luck for upcoming surgery.

## 2016-03-14 ENCOUNTER — Telehealth: Payer: Self-pay | Admitting: Emergency Medicine

## 2016-03-14 NOTE — Telephone Encounter (Signed)
lmtcb

## 2016-03-14 NOTE — Telephone Encounter (Signed)
Spoke with Melissa and she states that pt saw cards and was advised to see pulm/RB immediately as they do not think pt's condition is heart related. Melissa wanted to only schedule with RB but he is not in office until 7/17. Pt already has appt 7/18 but per [facility] cards did not want pt to wait that long. Pt scheduled Monday 7/3 with MW as this was only opening. Nothing further needed.

## 2016-03-18 ENCOUNTER — Encounter: Payer: Self-pay | Admitting: Internal Medicine

## 2016-03-18 ENCOUNTER — Ambulatory Visit (INDEPENDENT_AMBULATORY_CARE_PROVIDER_SITE_OTHER): Payer: Medicare Other | Admitting: Internal Medicine

## 2016-03-18 ENCOUNTER — Ambulatory Visit (INDEPENDENT_AMBULATORY_CARE_PROVIDER_SITE_OTHER)
Admission: RE | Admit: 2016-03-18 | Discharge: 2016-03-18 | Disposition: A | Discharge status: Home / Self Care | Payer: Medicare Other | Source: Ambulatory Visit | Attending: Internal Medicine | Admitting: Internal Medicine

## 2016-03-18 VITALS — BP 100/60 | HR 80 | Ht 65.5 in | Wt 161.0 lb

## 2016-03-18 DIAGNOSIS — R918 Other nonspecific abnormal finding of lung field: Secondary | ICD-10-CM

## 2016-03-18 DIAGNOSIS — J441 Chronic obstructive pulmonary disease with (acute) exacerbation: Secondary | ICD-10-CM

## 2016-03-18 MED ORDER — PREDNISONE 10 MG PO TABS: ORAL_TABLET | ORAL | Status: DC

## 2016-03-18 NOTE — Patient Instructions (Addendum)
Plan A = Automatic =  Symbicort 160 Take 2 puffs first thing in am and then another 2 puffs about 12 hours later  Incruse one click two drags each am   Prednisone 10 Take 4 for three days 3 for three days 2 for three days 1 for three days and stop    Plan B = Backup Only use your albuterol as a rescue medication to be used if you can't catch your breath by resting or doing a relaxed purse lip breathing pattern.  - The less you use it, the better it will work when you need it. - Ok to use the inhaler up to 2 puffs  every 4 hours if you must but call for appointment if use goes up over your usual need - Don't leave home without it !!  (think of it like the spare tire for your car)   Plan C = Crisis - only use your albuterol nebulizer if you first try Plan B and it fails to help > ok to use the nebulizer up to every 4 hours but if start needing it regularly call for immediate appointment  Try prilosec otc 20mg   Take 30-60 min before first meal of the day and Pepcid ac (famotidine) 20 mg one @  bedtime until cough is completely gone for at least a week without the need for cough suppression  Work on inhaler technique:  relax and gently blow all the way out then take a nice smooth deep breath back in, triggering the inhaler at same time you start breathing in.  Hold for up to 5 seconds if you can. Blow out thru nose. Rinse and gargle with water when done     Please remember to go to the   x-ray department downstairs for your tests - we will call you with the results when they are available.  Keep appt to see Dr Lamonte Sakai - call sooner if needed

## 2016-03-18 NOTE — Progress Notes (Signed)
Quick Note:  ATC, NA and person answering the phone asked me to call back later, WCB ______

## 2016-03-18 NOTE — Progress Notes (Addendum)
Subjective:    Patient ID: [name], female    DOB: 1933/08/17, 82 y.o.   MRN: [id number]  HPI 89 yowf quit smoking 1999  with severe COPD, diastolic congestive heart  02/27/2016 Acute OV  Patient returns for persistent symptoms. Patient was seen 2 weeks ago for COPD exacerbation. She was given Levaquin 500 mg for 7 days and a prednisone taper. Since last visit. Patient says she has persistent  increased SOB and wheezing but feels she is better. No fever or discolored mucus . Marland Kitchen Pt is using 3L continuously. CXR today shows chronic changes.  She remains on Symbicort twice daily and Incruse daily  rec Continue on Symbicort and Incruse.  Continue on Oxygen .  Order to DME for POC    03/03/16 to [facility] ER > admitted for cardiac issues / aortic /mitral valve dz > RHC/LHC with PCWP 10 p diuresis  And PA mean 32 / CO Fick = 5.45 so PVR = 4.0 03/07/16 home on metaprolol with new cough   03/14/16 cards f/u increased metaprolol    03/18/2016 acute extended ov/transition of care from admit/ Andreanna Mikolajczak re: aecopd/ cough developed since HRCT in [facility]/ maint rx = symb 160 with poor hfa / incruse  Chief Complaint  Patient presents with  . Acute Visit    Breathing "maybe some worse" since her last visit with Dr Lamonte Sakai 02/13/16. Family reports increased cough but pt denies this. Cough is non prod.   onset of cough was p admit assoc with grad worse sob/increase need for hfa over baseline.  No obvious day to day or daytime variability or assoc excess/ purulent sputum or mucus plugs or hemoptysis or cp or chest tightness, subjective wheeze or overt sinus or hb symptoms. No unusual exp hx or h/o childhood pna/ asthma or knowledge of premature birth.  Sleeping ok without nocturnal  or early am exacerbation  of respiratory  c/o's or need for noct saba. Also denies any obvious fluctuation of symptoms with weather or environmental changes or other aggravating or alleviating factors except as outlined  above   Current Medications, Allergies, Complete Past Medical History, Past Surgical History, Family History, and Social History were reviewed in [employer] record.  ROS  The following are not active complaints unless bolded sore throat, dysphagia, dental problems, itching, sneezing,  nasal congestion or excess/ purulent secretions, ear ache,   fever, chills, sweats, unintended wt loss, classically pleuritic or exertional cp,  orthopnea pnd or leg swelling, presyncope, palpitations, abdominal pain, anorexia, nausea, vomiting, diarrhea  or change in bowel or bladder habits, change in stools or urine, dysuria,hematuria,  rash, arthralgias, visual complaints, headache, numbness, weakness or ataxia or problems with walking or coordination= w/c bound,  change in mood/affect or memory.                  Objective:   Physical Exam    GEN: A/Ox3; chronically ill  NAD, elderly , w/c bound  Wt Readings from Last 3 Encounters:  03/18/16 161 lb (73.029 kg)  02/27/16 165 lb (74.844 kg)  02/13/16 167 lb (75.751 kg)    Vital signs reviewed :  note  O2 3lpm with sats 92% at rest     HEENT:  [place]/AT,  EACs-clear, TMs-wnl, NOSE-clear, THROAT-clear, no lesions, no postnasal drip or exudate noted.   NECK:  Supple w/ fair ROM; no JVD; normal carotid impulses w/o bruits; no thyromegaly or nodules palpated; no lymphadenopathy.  RESP  Bilateral Pan  exp wheeze/ coughno accessory muscle use, no dullness to percussion  CARD:  RRR, no m/r/g  , no peripheral edema, pulses intact, no cyanosis or clubbing.  GI:   Soft & nt; nml bowel sounds; no organomegaly or masses detected.  Musco: Warm bil, no deformities or joint swelling noted.   Neuro: alert, no focal deficits noted.    Skin: Warm, no lesions or rashes    I personally reviewed images and agree with radiology impression as follows:  CT Chest  HRCT 03/03/16 Severe emphysema with likely smoking-related fibrotic changes in  the lung bases. - Lower lobe pulmonary nodules measuring up to 7 mm. Recommend follow-up chest CT in 3 months to confirm stability. - Ectatic ascending aorta to 4 cm. - Dilated main pulmonary artery which can be seen in pulmonary hypertension     CXR PA and Lateral:   03/18/2016 :    I personally reviewed images and agree with radiology impression as follows:    1. Mild accentuation of the basilar pulmonary markings and a new small right pleural effusion. Findings may be due to congestive heart failure. 2. Emphysema. 3. Aortic atherosclerosis.

## 2016-03-19 DIAGNOSIS — R918 Other nonspecific abnormal finding of lung field: Secondary | ICD-10-CM | POA: Insufficient documentation

## 2016-03-19 NOTE — Assessment & Plan Note (Signed)
PFT's  11/25/08   FEV1 1.56 (72 % ) ratio 58  p 4 % improvement from saba p ? prior to study with DLCO  44/44 % corrects to 71 % for alv volume  Acute flare ? Etiology in pt with prev GOLD II 10 y after quit smoking with predominantly emphysematous features previously  DDX of  difficult airways management almost all start with A and  include Adherence, Ace Inhibitors, Acid Reflux, Active Sinus Disease, Alpha 1 Antitripsin deficiency, Anxiety masquerading as Airways dz,  ABPA,  Allergy(esp in young), Aspiration (esp in elderly), Adverse effects of meds,  Active smokers, A bunch of PE's (a small clot burden can't cause this syndrome unless there is already severe underlying pulm or vascular dz with poor reserve) plus two Bs  = Bronchiectasis and Beta blocker use..and one C= CHF   Adherence is always the initial "prime suspect" and is a multilayered concern that requires a "trust but verify" approach in every patient - starting with knowing how to use medications, especially inhalers, correctly, keeping up with refills and understanding the fundamental difference between maintenance and prns vs those medications only taken for a very short course and then stopped and not refilled.  - The proper method of use, as well as anticipated side effects, of a metered-dose inhaler are discussed and demonstrated to the patient. Improved effectiveness after extensive coaching during this visit to a level of approximately 75 % from a baseline of 25 %  > continue symbiocrt hfa/ incruse for now but consider changing over to all dpi vs all hfa depending on cough control  ? Acid (or non-acid) GERD > always difficult to exclude as up to 75% of pts in some series report no assoc GI/ Heartburn symptoms> rec max (24h)  acid suppression and diet restrictions/ reviewed and instructions given in writing.   ? Allergy /asthma component > symb 160 should help if uses correctly plus rx Prednisone 10 mg take  4 each am x 2 days,   2  each am x 2 days,  1 each am x 2 days and stop   ? BB > a potential concern esp if high dose BB needed for cardiomyopathy  ? CHF > after diuresis note pcwp 10 and still sob at that point so for now not an issue despite new small R effusion noted  I had an extended discussion with the patient reviewing all relevant studies completed to date (including her recent cards w/u in care everywhere) and  lasting 25 minutes of a 40  minute acute transition of care office visit    Each maintenance medication was reviewed in detail including most importantly the difference between maintenance and prns and under what circumstances the prns are to be triggered using an action plan format that is not reflected in the computer generated alphabetically organized AVS.    Please see instructions for details which were reviewed in writing and the patient given a copy highlighting the part that I personally wrote and discussed at today's ov.

## 2016-03-19 NOTE — Assessment & Plan Note (Addendum)
See CT chest 03/03/16 x 7 mm > rec f/u ct s contrast 06/03/16 (reminder file )   Although there are clearly abnormalities on CT scan, they should probably be considered "microscopic" since not obvious on plain cxr .     In the setting of obvious "macroscopic" health issues,  I am very reluctatnt to embark on an invasive w/u at this point but will arrange consevative  follow up and in the meantime see what we can do to address the patient's subjective concerns.    Discussed in detail all the  indications, usual  risks and alternatives  relative to the benefits with patient who agrees to proceed with conservative f/u as outlined

## 2016-03-21 NOTE — Progress Notes (Signed)
Quick Note:  Spoke with Melissa and notified of results/recs and she will inform the pt ______

## 2016-04-02 ENCOUNTER — Ambulatory Visit (INDEPENDENT_AMBULATORY_CARE_PROVIDER_SITE_OTHER)
Admission: RE | Admit: 2016-04-02 | Discharge: 2016-04-02 | Disposition: A | Discharge status: Home / Self Care | Payer: Medicare Other | Source: Ambulatory Visit | Attending: Emergency Medicine | Admitting: Emergency Medicine

## 2016-04-02 ENCOUNTER — Encounter: Payer: Self-pay | Admitting: Emergency Medicine

## 2016-04-02 ENCOUNTER — Ambulatory Visit (INDEPENDENT_AMBULATORY_CARE_PROVIDER_SITE_OTHER): Payer: Medicare Other | Admitting: Emergency Medicine

## 2016-04-02 VITALS — BP 114/66 | HR 93 | Ht 66.0 in | Wt 165.0 lb

## 2016-04-02 DIAGNOSIS — J441 Chronic obstructive pulmonary disease with (acute) exacerbation: Secondary | ICD-10-CM | POA: Diagnosis not present

## 2016-04-02 DIAGNOSIS — J449 Chronic obstructive pulmonary disease, unspecified: Secondary | ICD-10-CM | POA: Diagnosis not present

## 2016-04-02 DIAGNOSIS — J90 Pleural effusion, not elsewhere classified: Secondary | ICD-10-CM | POA: Diagnosis not present

## 2016-04-02 NOTE — Patient Instructions (Addendum)
It is nice to meet you today. We will schedule you for PFT's prior to your surgical procedure. CXR today to ensure resolution of small pleural effusion. Continue your Incruse and Symbicort as you have been doing. Remember to rinse your mouth with water after use. Continue using your Duo Nebs twice daily for shortness of breath or wheezing. We will request a copy of the HRCT done in [facility] 03/03/2016 We will ask that your procedure be done with a block instead of general anesthesia due to the very high risk of complications with your lung disease. Follow up with Dr. Lamonte Sakai after PFT's completed. We will send a letter to your urologist with our recommendations  Please contact office for sooner follow up if symptoms do not improve or worsen or seek emergency care

## 2016-04-02 NOTE — Assessment & Plan Note (Addendum)
Slow to resolve COPD exacerbation Plan: We will schedule you for PFT's prior to your surgical procedure. CXR today to ensure resolution of small pleural effusion. Continue your Incruse and Symbicort as you have been doing. Remember to rinse your mouth with water after use. Continue using your Duo Nebs twice daily for shortness of breath or wheezing. We will request a copy of the HRCT done in [facility] 03/03/2016 We will ask that your procedure be done with a block instead of general anesthesia due to the very high risk of complications with your lung disease with general anesthesia. Follow up with Dr. Lamonte Sakai after PFT's completed. We will send a letter to your urologist with our recommendations  Please contact office for sooner follow up if symptoms do not improve or worsen or seek emergency care

## 2016-04-02 NOTE — Progress Notes (Signed)
History of Present Illness [name] is a 82 y.o. female former smoker ( quit smoking 1999 with severe COPD, and diastolic congestive heart failure.   7/18/2017Follow up appointment: Pt. Presents to the office today for follow up of slow to resolve COPD exacerbation which the family feels is very responsive to prednisone. Her last does of Levaquin was the end of may, beginning of June.She has subsequently had an additional prednisone taper 03/18/2016. She is now better and remains on 3L [place].She did have a subsequent admission to [facility] for dyspnea and had a cardiac work up that included both right and left heart cath. Pressures were low and patient was thought to be dehydrated. She was diagnosed with Hypertropic cardiomyopathy and has been started on BB treatment with 50 mg daily of Labetalol ( 25 mg BID). She states she has an improvement while on prednisone and then relapses within about 4-5 days.She was stared on [employer] neb treatments and is taking them twice daily. She is compliant with her Symbicort and IncruseShe states she does not have a productive cough. She denies fever, chest pain, she sleeps with her head elevated.She does not have a cough.An incidental finding during her last hospitalization was a bladder tumor. They are wanting clearance for surgery. This tumor is bleeding and the patient was transfused with 2 units PRBC's last week for low HGB and low Ferritin.Dr. John Giovanni of [facility] is planning to perform the surgery.The bleeding is a long term ongoing issue. They now suspect AVM's and an actively  bleeding tumor.   Tests: IMPRESSION: 1. Mild accentuation of the basilar pulmonary markings and a new small right pleural effusion. Findings may be due to congestive heart failure. 2. Emphysema. 3. Aortic atherosclerosis.  03/05/2016: Echo Hyperdynamic left ventricular systolic function, ejection fraction 65 to  70%  Echo contrast utilized to enhance endocardial  border definition Significant Events:  Admission to [facility] 03/03/2016-03/07/2016 for dyspnea.  Past medical hx Past Medical History  Diagnosis Date  . COPD (chronic obstructive pulmonary disease) ([place])   . Hypertrophic obstructive cardiomyopathy     Echo 10/10 EF 65-70% SW 1.6 PW 1.4 mild SAM no LVOT gradient at rest. Valsalva not performed Mild MR. No hyperenhance ment on MRI EF 69%.  . Coronary atherosclerosis of native coronary artery     Cath 1/11: RCA 50-60 ostial o/w normal. EF 70%.   . Pulmonary hypertension ([place])     Cath 1/11 RA 8, RV 43/6/14, PA 42/17 (29) PCWP 15 Fick  5.0/2.6. PVR 2.8    . Edema   . Occlusion and stenosis of carotid artery     s/p L CEA u/s 3/11. R 60-79%; L 40-59%. no change since 3/10  . Peripheral vascular disease, unspecified ([place])   . Hypertension   . Emphysema   . Asthma   . Hypothyroidism   . Allergic rhinitis   . Gastric AVM   . Heart murmur   . Anginal pain ([place])     "pressure"  . Emphysema   . Oxygen dependent 04/23/2012    "concentrated at night; liquid during day"  . Dyspnea 04/23/2012    "all the time"  . Anemia 04/23/2012    "severe @ times; think caused by colonic AVMs"  . History of blood transfusion     "many times"  . H/O hiatal hernia   . Stroke [facility]) ~ 2004    denies residual on 04/23/2012  . Arthritis     "probably"  .  Breast cancer ([place])   . Pneumonia      Past surgical hx, Family hx, Social hx all reviewed.  Current Outpatient Prescriptions on File Prior to Visit  Medication Sig  . albuterol (PROVENTIL HFA;VENTOLIN HFA) 108 (90 Base) MCG/ACT inhaler Inhale 2 puffs into the lungs every 6 (six) hours as needed for wheezing or shortness of breath.  Marland Kitchen atorvastatin (LIPITOR) 20 MG tablet Take 20 mg by mouth daily.    . budesonide-formoterol (SYMBICORT) 160-4.5 MCG/ACT inhaler Inhale 2 puffs into the lungs 2 (two) times daily.  . cetirizine (ZYRTEC) 10 MG tablet Take 10 mg by mouth daily.  . cholecalciferol (VITAMIN D)  1000 units tablet Take 1,000 Units by mouth daily.  Marland Kitchen dextromethorphan (DELSYM) 30 MG/5ML liquid Take 30 mg by mouth at bedtime as needed for cough.   . escitalopram (LEXAPRO) 20 MG tablet Take 20 mg by mouth daily.   . fluticasone (FLONASE) 50 MCG/ACT nasal spray Place 2 sprays into both nostrils daily. (Patient taking differently: Place 2 sprays into both nostrils daily as needed for rhinitis. )  . furosemide (LASIX) 20 MG tablet Take 20-40 mg by mouth daily.   Marland Kitchen levothyroxine (SYNTHROID, LEVOTHROID) 150 MCG tablet Take 162 mcg by mouth daily.   . metoprolol tartrate (LOPRESSOR) 25 MG tablet Take 25 mg by mouth 2 (two) times daily.  . Multiple Vitamin (MULTIVITAMIN WITH MINERALS) TABS tablet Take 1 tablet by mouth daily.  . polyethylene glycol (MIRALAX / GLYCOLAX) packet Take 17 g by mouth daily as needed for mild constipation.  . predniSONE (DELTASONE) 10 MG tablet Take 4 for three days 3 for three days 2 for three days 1 for three days and stop  . senna-docusate (SENOKOT-S) 8.6-50 MG tablet Take 1 tablet by mouth 2 (two) times daily.  Marland Kitchen tolterodine (DETROL LA) 2 MG 24 hr capsule Take 2 mg by mouth 2 (two) times daily.   . traZODone (DESYREL) 100 MG tablet   . umeclidinium bromide (INCRUSE ELLIPTA) 62.5 MCG/INH AEPB Inhale 1 puff into the lungs at bedtime.  . VOLTAREN 1 % GEL Use as directed   No current facility-administered medications on file prior to visit.     Allergies  Allergen Reactions  . Latex Hives and Itching    Review Of Systems:  Constitutional:   No  weight loss, night sweats,  Fevers, chills, fatigue, or  lassitude.  HEENT:   No headaches,  Difficulty swallowing,  Tooth/dental problems, or  Sore throat,                No sneezing, itching, ear ache, nasal congestion, post nasal drip,   CV:  No chest pain,  Orthopnea, PND, +swelling in lower extremities, anasarca, dizziness, palpitations, syncope.   GI  No heartburn, indigestion, abdominal pain, nausea, vomiting,  diarrhea, change in bowel habits, loss of appetite, bloody stools.   Resp: + shortness of breath with exertion less at rest.  No excess mucus, no productive cough,  No non-productive cough,  No coughing up of blood.  No change in color of mucus.  + wheezing.  No chest wall deformity  Skin: no rash or lesions.  GU: no dysuria, change in color of urine, no urgency or frequency.  No flank pain, no hematuria   MS:  No joint pain or swelling.  No decreased range of motion.  No back pain.  Psych:  No change in mood or affect. No depression or anxiety.  No memory loss.   Vital Signs BP  114/66 mmHg  Pulse 93  Ht 5\' 6"  (1.676 m)  Wt 165 lb (74.844 kg)  BMI 26.64 kg/m2  SpO2 90%   Physical Exam:  General- No distress,  A&Ox3, pleasant on 3 L [place] ENT: No sinus tenderness, TM clear, pale nasal mucosa, no oral exudate,no post nasal drip, no LAN Cardiac: S1, S2, regular rate and rhythm, no murmur Chest: + wheeze/ rales/ dullness; no accessory muscle use, no nasal flaring, no sternal retractions Abd.: Soft Non-tender Ext: No clubbing cyanosis, 2+ edema Neuro:  normal strength Skin: No rashes, warm and dry Psych: normal mood and behavior   Assessment/Plan  COPD with acute exacerbation (HCC) Slow to resolve COPD exacerbation Plan: We will schedule you for PFT's prior to your surgical procedure. CXR today to ensure resolution of small pleural effusion. Continue your Incruse and Symbicort as you have been doing. Remember to rinse your mouth with water after use. Continue using your Duo Nebs twice daily for shortness of breath or wheezing. We will request a copy of the HRCT done in [facility] 03/03/2016 We will ask that your procedure be done with a block instead of general anesthesia due to the very high risk of complications with your lung disease with general anesthesia. Follow up with Dr. Lamonte Sakai after PFT's completed. We will send a letter to your urologist with our recommendations    Please contact office for sooner follow up if symptoms do not improve or worsen or seek emergency care        Magdalen Spatz, NP 04/02/2016  2:41 PM    Attending Note:  I have examined patient, reviewed labs, studies and notes. I have discussed the case with Gladstone Pih, and I agree with the data and plans as amended above. As above several interval events since my last visit with Mrs. Cookson. She's been treated on several occasions with prednisone for upper acute exacerbations of her COPD. She has responded to therapy. She's also been evaluated by cardiology and found to have hypertropic obstructive cardiomyopathy. Despite this her volume status was felt to be euvolemic and most of her dyspnea was ascribed to her underlying lung disease. Another factor that has been identified is anemia likely now related to a bladder tumor. She is being evaluated by urology at [facility]. In preparation for resection we will obtain pulmonary function testing. Given her underlying COPD, the newly identified cardiopathy myopathy, and her overall functional capacity she is high risk for general anesthesia and surgery. I believe that a local nerve block would be far preferable to general anesthesia and intubation. I discussed this with her and with her daughter today. Was also noted that she had a CT scan of the chest while she was being evaluated at [facility]. There was some question of coexisting interstitial disease in addition to her COPD. I would like to obtain copies of these films for review and comparison to priors.  Baltazar Apo, MD, PhD 04/02/2016, 2:55 PM [facility] [phone] or if no answer [phone]

## 2016-04-15 ENCOUNTER — Ambulatory Visit (INDEPENDENT_AMBULATORY_CARE_PROVIDER_SITE_OTHER): Payer: Medicare Other | Admitting: Emergency Medicine

## 2016-04-15 DIAGNOSIS — J449 Chronic obstructive pulmonary disease, unspecified: Secondary | ICD-10-CM

## 2016-04-15 LAB — PULMONARY FUNCTION TEST
DL/VA % pred: 49 %
DL/VA: 2.44 ml/min/mmHg/L
DLCO UNC: 7.39 ml/min/mmHg
DLCO cor % pred: 30 %
DLCO cor: 7.83 ml/min/mmHg
DLCO unc % pred: 28 %
FEF 25-75 Post: 0.48 L/sec
FEF 25-75 Pre: 0.43 L/sec
FEF2575-%Change-Post: 11 %
FEF2575-%PRED-POST: 34 %
FEF2575-%Pred-Pre: 31 %
FEV1-%CHANGE-POST: 4 %
FEV1-%PRED-POST: 54 %
FEV1-%Pred-Pre: 52 %
FEV1-PRE: 1.03 L
FEV1-Post: 1.08 L
FEV1FVC-%Change-Post: 2 %
FEV1FVC-%Pred-Pre: 78 %
FEV6-%Change-Post: 2 %
FEV6-%PRED-POST: 72 %
FEV6-%Pred-Pre: 70 %
FEV6-POST: 1.82 L
FEV6-PRE: 1.78 L
FEV6FVC-%CHANGE-POST: 0 %
FEV6FVC-%PRED-POST: 105 %
FEV6FVC-%PRED-PRE: 105 %
FVC-%CHANGE-POST: 1 %
FVC-%PRED-POST: 68 %
FVC-%PRED-PRE: 67 %
FVC-POST: 1.83 L
FVC-Pre: 1.79 L
POST FEV1/FVC RATIO: 59 %
PRE FEV6/FVC RATIO: 99 %
Post FEV6/FVC ratio: 100 %
Pre FEV1/FVC ratio: 57 %
RV % PRED: 122 %
RV: 3.01 L
TLC % pred: 92 %
TLC: 4.83 L

## 2016-04-19 ENCOUNTER — Telehealth: Payer: Self-pay | Admitting: Internal Medicine

## 2016-04-19 ENCOUNTER — Other Ambulatory Visit: Payer: Self-pay | Admitting: Internal Medicine

## 2016-04-19 DIAGNOSIS — R918 Other nonspecific abnormal finding of lung field: Secondary | ICD-10-CM

## 2016-04-19 NOTE — Telephone Encounter (Signed)
Spoke with pt and advised that per CT done 6/17 she needs repeat scan in 3 months for nodule follow up. Explained to pt. She voiced understanding. Nothing further needed.

## 2016-04-22 ENCOUNTER — Telehealth: Payer: Self-pay | Admitting: Hematology

## 2016-04-22 ENCOUNTER — Other Ambulatory Visit: Payer: Self-pay | Admitting: Hematology

## 2016-04-22 ENCOUNTER — Telehealth: Payer: Self-pay | Admitting: *Deleted

## 2016-04-22 NOTE — Telephone Encounter (Signed)
spoke w/ pt neice and confirmed 8/11 & 8/18 apt times

## 2016-04-22 NOTE — Telephone Encounter (Signed)
Niece, Su Hoff called and left message.  She received report of blood work last night from [facility], done about 10 days ago.  Ferritin is 47.  Patient received 2 units of blood 1 1/2 to 2 weeks ago.  She would like to get her aunt scheduled for fereheme infusion.  If we have an opening this afternoon, Melissa has to be in [place] for an appt.for her dtr and it would be good if she could get her in this afternoon.  Her call back is [phone].

## 2016-04-26 ENCOUNTER — Ambulatory Visit (HOSPITAL_BASED_OUTPATIENT_CLINIC_OR_DEPARTMENT_OTHER): Payer: Medicare Other

## 2016-04-26 VITALS — BP 131/57 | HR 86 | Temp 98.3°F | Resp 16

## 2016-04-26 DIAGNOSIS — K5521 Angiodysplasia of colon with hemorrhage: Secondary | ICD-10-CM

## 2016-04-26 DIAGNOSIS — D509 Iron deficiency anemia, unspecified: Secondary | ICD-10-CM | POA: Diagnosis present

## 2016-04-26 MED ORDER — FERUMOXYTOL INJECTION 510 MG/17 ML
510.0000 mg | INTRAVENOUS | Status: DC
  Administered 2016-04-26: 510 mg via INTRAVENOUS
  Filled 2016-04-26: qty 17

## 2016-04-26 MED ORDER — SODIUM CHLORIDE 0.9 % IV SOLN
Freq: Once | INTRAVENOUS | Status: AC
  Administered 2016-04-26: 11:00:00 via INTRAVENOUS

## 2016-04-26 NOTE — Patient Instructions (Signed)

## 2016-05-03 ENCOUNTER — Ambulatory Visit (HOSPITAL_BASED_OUTPATIENT_CLINIC_OR_DEPARTMENT_OTHER): Payer: Medicare Other

## 2016-05-03 VITALS — BP 114/65 | HR 88 | Temp 98.6°F | Resp 20

## 2016-05-03 DIAGNOSIS — D509 Iron deficiency anemia, unspecified: Secondary | ICD-10-CM | POA: Diagnosis present

## 2016-05-03 DIAGNOSIS — K5521 Angiodysplasia of colon with hemorrhage: Secondary | ICD-10-CM

## 2016-05-03 MED ORDER — SODIUM CHLORIDE 0.9 % IV SOLN
510.0000 mg | INTRAVENOUS | Status: DC
  Administered 2016-05-03: 510 mg via INTRAVENOUS
  Filled 2016-05-03: qty 17

## 2016-05-03 NOTE — Patient Instructions (Signed)

## 2016-05-14 ENCOUNTER — Ambulatory Visit: Payer: Medicare Other | Admitting: Emergency Medicine

## 2016-05-21 ENCOUNTER — Telehealth: Payer: Self-pay | Admitting: *Deleted

## 2016-05-21 NOTE — Telephone Encounter (Signed)
Received faxed lab results from [facility] done today 05/21/16.   Gave results to md for review. [place]   Phone     [phone]    ;    Fax     [phone].

## 2016-05-22 ENCOUNTER — Telehealth: Payer: Self-pay | Admitting: *Deleted

## 2016-05-22 NOTE — Telephone Encounter (Signed)
TC to patient to inform her that her recent work was good and that she does not need an iron infusion. Pt verblaized understanding. No concerns or questions at this time.

## 2016-06-03 ENCOUNTER — Ambulatory Visit: Admission: RE | Admit: 2016-06-03 | Payer: Medicare Other | Source: Ambulatory Visit | Admitting: Internal Medicine

## 2016-06-05 ENCOUNTER — Other Ambulatory Visit: Payer: Medicare Other

## 2016-06-24 ENCOUNTER — Inpatient Hospital Stay (HOSPITAL_COMMUNITY): Payer: Medicare Other

## 2016-06-24 ENCOUNTER — Encounter: Payer: Self-pay | Admitting: Acute Care

## 2016-06-24 ENCOUNTER — Ambulatory Visit (INDEPENDENT_AMBULATORY_CARE_PROVIDER_SITE_OTHER): Payer: Medicare Other | Admitting: Acute Care

## 2016-06-24 ENCOUNTER — Inpatient Hospital Stay (HOSPITAL_COMMUNITY)
Admission: AD | Admit: 2016-06-24 | Discharge: 2016-06-26 | DRG: 291 | Disposition: A | Discharge status: Home / Self Care | Payer: Medicare Other | Source: Ambulatory Visit | Attending: Internal Medicine | Admitting: Internal Medicine

## 2016-06-24 ENCOUNTER — Encounter (HOSPITAL_COMMUNITY): Payer: Self-pay | Admitting: *Deleted

## 2016-06-24 VITALS — BP 112/60 | HR 92

## 2016-06-24 DIAGNOSIS — C679 Malignant neoplasm of bladder, unspecified: Secondary | ICD-10-CM | POA: Diagnosis present

## 2016-06-24 DIAGNOSIS — Z87891 Personal history of nicotine dependence: Secondary | ICD-10-CM

## 2016-06-24 DIAGNOSIS — Z823 Family history of stroke: Secondary | ICD-10-CM

## 2016-06-24 DIAGNOSIS — I272 Pulmonary hypertension, unspecified: Secondary | ICD-10-CM | POA: Diagnosis present

## 2016-06-24 DIAGNOSIS — Z853 Personal history of malignant neoplasm of breast: Secondary | ICD-10-CM | POA: Diagnosis not present

## 2016-06-24 DIAGNOSIS — Z825 Family history of asthma and other chronic lower respiratory diseases: Secondary | ICD-10-CM

## 2016-06-24 DIAGNOSIS — I2510 Atherosclerotic heart disease of native coronary artery without angina pectoris: Secondary | ICD-10-CM | POA: Diagnosis present

## 2016-06-24 DIAGNOSIS — J9621 Acute and chronic respiratory failure with hypoxia: Secondary | ICD-10-CM | POA: Diagnosis present

## 2016-06-24 DIAGNOSIS — I421 Obstructive hypertrophic cardiomyopathy: Secondary | ICD-10-CM | POA: Diagnosis present

## 2016-06-24 DIAGNOSIS — J441 Chronic obstructive pulmonary disease with (acute) exacerbation: Secondary | ICD-10-CM | POA: Diagnosis present

## 2016-06-24 DIAGNOSIS — Z800 Family history of malignant neoplasm of digestive organs: Secondary | ICD-10-CM

## 2016-06-24 DIAGNOSIS — F329 Major depressive disorder, single episode, unspecified: Secondary | ICD-10-CM | POA: Diagnosis present

## 2016-06-24 DIAGNOSIS — I110 Hypertensive heart disease with heart failure: Secondary | ICD-10-CM | POA: Diagnosis present

## 2016-06-24 DIAGNOSIS — Z8673 Personal history of transient ischemic attack (TIA), and cerebral infarction without residual deficits: Secondary | ICD-10-CM

## 2016-06-24 DIAGNOSIS — I5033 Acute on chronic diastolic (congestive) heart failure: Secondary | ICD-10-CM | POA: Diagnosis present

## 2016-06-24 DIAGNOSIS — K449 Diaphragmatic hernia without obstruction or gangrene: Secondary | ICD-10-CM | POA: Diagnosis present

## 2016-06-24 DIAGNOSIS — I739 Peripheral vascular disease, unspecified: Secondary | ICD-10-CM | POA: Diagnosis present

## 2016-06-24 DIAGNOSIS — Z8249 Family history of ischemic heart disease and other diseases of the circulatory system: Secondary | ICD-10-CM | POA: Diagnosis not present

## 2016-06-24 DIAGNOSIS — J9622 Acute and chronic respiratory failure with hypercapnia: Secondary | ICD-10-CM | POA: Diagnosis present

## 2016-06-24 DIAGNOSIS — Z9981 Dependence on supplemental oxygen: Secondary | ICD-10-CM | POA: Diagnosis not present

## 2016-06-24 DIAGNOSIS — E039 Hypothyroidism, unspecified: Secondary | ICD-10-CM | POA: Diagnosis present

## 2016-06-24 DIAGNOSIS — E785 Hyperlipidemia, unspecified: Secondary | ICD-10-CM | POA: Diagnosis present

## 2016-06-24 DIAGNOSIS — J810 Acute pulmonary edema: Secondary | ICD-10-CM | POA: Diagnosis not present

## 2016-06-24 LAB — CBC WITH DIFFERENTIAL/PLATELET
BASOS ABS: 0 10*3/uL (ref 0.0–0.1)
Basophils Relative: 0 %
EOS ABS: 0.2 10*3/uL (ref 0.0–0.7)
Eosinophils Relative: 2 %
HCT: 36.9 % (ref 36.0–46.0)
Hemoglobin: 11.3 g/dL — ABNORMAL LOW (ref 12.0–15.0)
Lymphocytes Relative: 17 %
Lymphs Abs: 1.4 10*3/uL (ref 0.7–4.0)
MCH: 30.1 pg (ref 26.0–34.0)
MCHC: 30.6 g/dL (ref 30.0–36.0)
MCV: 98.4 fL (ref 78.0–100.0)
MONO ABS: 1.1 10*3/uL — AB (ref 0.1–1.0)
Monocytes Relative: 13 %
NEUTROS ABS: 5.6 10*3/uL (ref 1.7–7.7)
Neutrophils Relative %: 68 %
PLATELETS: 192 10*3/uL (ref 150–400)
RBC: 3.75 MIL/uL — ABNORMAL LOW (ref 3.87–5.11)
RDW: 15.2 % (ref 11.5–15.5)
WBC: 8.2 10*3/uL (ref 4.0–10.5)

## 2016-06-24 LAB — URINE MICROSCOPIC-ADD ON

## 2016-06-24 LAB — URINALYSIS, ROUTINE W REFLEX MICROSCOPIC
Bilirubin Urine: NEGATIVE
Glucose, UA: NEGATIVE mg/dL
KETONES UR: NEGATIVE mg/dL
NITRITE: NEGATIVE
PROTEIN: 100 mg/dL — AB
Specific Gravity, Urine: 1.027 (ref 1.005–1.030)
pH: 6 (ref 5.0–8.0)

## 2016-06-24 LAB — COMPREHENSIVE METABOLIC PANEL
ALT: 14 U/L (ref 14–54)
AST: 26 U/L (ref 15–41)
Albumin: 3.6 g/dL (ref 3.5–5.0)
Alkaline Phosphatase: 69 U/L (ref 38–126)
Anion gap: 5 (ref 5–15)
BUN: 18 mg/dL (ref 6–20)
CALCIUM: 8.2 mg/dL — AB (ref 8.9–10.3)
CHLORIDE: 95 mmol/L — AB (ref 101–111)
CO2: 35 mmol/L — ABNORMAL HIGH (ref 22–32)
CREATININE: 0.62 mg/dL (ref 0.44–1.00)
Glucose, Bld: 94 mg/dL (ref 65–99)
Potassium: 3.6 mmol/L (ref 3.5–5.1)
Sodium: 135 mmol/L (ref 135–145)
Total Bilirubin: 0.7 mg/dL (ref 0.3–1.2)
Total Protein: 6.9 g/dL (ref 6.5–8.1)

## 2016-06-24 LAB — PHOSPHORUS: PHOSPHORUS: 2.6 mg/dL (ref 2.5–4.6)

## 2016-06-24 LAB — BRAIN NATRIURETIC PEPTIDE: B NATRIURETIC PEPTIDE 5: 304.4 pg/mL — AB (ref 0.0–100.0)

## 2016-06-24 LAB — TROPONIN I: Troponin I: 0.05 ng/mL (ref <0.03)

## 2016-06-24 LAB — LACTIC ACID, PLASMA: Lactic Acid, Venous: 1.2 mmol/L (ref 0.5–1.9)

## 2016-06-24 LAB — PROCALCITONIN

## 2016-06-24 LAB — D-DIMER, QUANTITATIVE (NOT AT ARMC)

## 2016-06-24 LAB — MAGNESIUM: MAGNESIUM: 2.3 mg/dL (ref 1.7–2.4)

## 2016-06-24 MED ORDER — FESOTERODINE FUMARATE ER 4 MG PO TB24
4.0000 mg | ORAL_TABLET | Freq: Every day | ORAL | Status: DC
  Administered 2016-06-25 – 2016-06-26 (×2): 4 mg via ORAL
  Filled 2016-06-24 (×2): qty 1

## 2016-06-24 MED ORDER — SODIUM CHLORIDE 0.9 % IV SOLN: 250.0000 mL | INTRAVENOUS | Status: DC | PRN

## 2016-06-24 MED ORDER — SODIUM CHLORIDE 0.9% FLUSH
3.0000 mL | Freq: Two times a day (BID) | INTRAVENOUS | Status: DC
Start: 2016-06-24 — End: 2016-06-25
  Administered 2016-06-24: 3 mL via INTRAVENOUS

## 2016-06-24 MED ORDER — VANCOMYCIN HCL IN DEXTROSE 1-5 GM/200ML-% IV SOLN
1000.0000 mg | Freq: Once | INTRAVENOUS | Status: AC
  Administered 2016-06-24: 1000 mg via INTRAVENOUS
  Filled 2016-06-24: qty 200

## 2016-06-24 MED ORDER — ONDANSETRON HCL 4 MG PO TABS: 4.0000 mg | ORAL_TABLET | Freq: Four times a day (QID) | ORAL | Status: DC | PRN

## 2016-06-24 MED ORDER — SODIUM CHLORIDE 0.9% FLUSH
3.0000 mL | Freq: Two times a day (BID) | INTRAVENOUS | Status: DC
  Administered 2016-06-25 (×2): 3 mL via INTRAVENOUS

## 2016-06-24 MED ORDER — METHYLPREDNISOLONE SODIUM SUCC 125 MG IJ SOLR
80.0000 mg | Freq: Two times a day (BID) | INTRAMUSCULAR | Status: DC
  Administered 2016-06-24 – 2016-06-25 (×2): 80 mg via INTRAVENOUS
  Filled 2016-06-24 (×2): qty 2

## 2016-06-24 MED ORDER — IPRATROPIUM-ALBUTEROL 0.5-2.5 (3) MG/3ML IN SOLN
3.0000 mL | Freq: Four times a day (QID) | RESPIRATORY_TRACT | Status: DC
  Administered 2016-06-24 – 2016-06-26 (×5): 3 mL via RESPIRATORY_TRACT
  Filled 2016-06-24 (×6): qty 3

## 2016-06-24 MED ORDER — POLYETHYLENE GLYCOL 3350 17 G PO PACK: 17.0000 g | PACK | Freq: Every day | ORAL | Status: DC | PRN

## 2016-06-24 MED ORDER — ENOXAPARIN SODIUM 40 MG/0.4ML ~~LOC~~ SOLN
40.0000 mg | SUBCUTANEOUS | Status: DC
  Administered 2016-06-24 – 2016-06-25 (×2): 40 mg via SUBCUTANEOUS
  Filled 2016-06-24 (×2): qty 0.4

## 2016-06-24 MED ORDER — DEXTROMETHORPHAN POLISTIREX ER 30 MG/5ML PO SUER
30.0000 mg | Freq: Every evening | ORAL | Status: DC | PRN
  Filled 2016-06-24: qty 5

## 2016-06-24 MED ORDER — IPRATROPIUM BROMIDE 0.02 % IN SOLN: 0.5000 mg | Freq: Four times a day (QID) | RESPIRATORY_TRACT | Status: DC

## 2016-06-24 MED ORDER — METOPROLOL TARTRATE 25 MG PO TABS
25.0000 mg | ORAL_TABLET | Freq: Two times a day (BID) | ORAL | Status: DC
  Administered 2016-06-24 – 2016-06-26 (×4): 25 mg via ORAL
  Filled 2016-06-24 (×4): qty 1

## 2016-06-24 MED ORDER — ALBUTEROL SULFATE (2.5 MG/3ML) 0.083% IN NEBU: 2.5000 mg | INHALATION_SOLUTION | Freq: Four times a day (QID) | RESPIRATORY_TRACT | Status: DC

## 2016-06-24 MED ORDER — PIPERACILLIN-TAZOBACTAM 3.375 G IVPB
3.3750 g | Freq: Once | INTRAVENOUS | Status: AC
  Administered 2016-06-24: 3.375 g via INTRAVENOUS
  Filled 2016-06-24: qty 50

## 2016-06-24 MED ORDER — IPRATROPIUM-ALBUTEROL 0.5-2.5 (3) MG/3ML IN SOLN
RESPIRATORY_TRACT | Status: AC
  Administered 2016-06-24: 3 mL
  Filled 2016-06-24: qty 3

## 2016-06-24 MED ORDER — PANTOPRAZOLE SODIUM 40 MG PO TBEC
40.0000 mg | DELAYED_RELEASE_TABLET | Freq: Every day | ORAL | Status: DC
  Administered 2016-06-24: 40 mg via ORAL
  Filled 2016-06-24: qty 1

## 2016-06-24 MED ORDER — ATORVASTATIN CALCIUM 10 MG PO TABS
20.0000 mg | ORAL_TABLET | Freq: Every day | ORAL | Status: DC
  Administered 2016-06-25 – 2016-06-26 (×2): 20 mg via ORAL
  Filled 2016-06-24 (×2): qty 2

## 2016-06-24 MED ORDER — ACETAMINOPHEN 325 MG PO TABS: 650.0000 mg | ORAL_TABLET | Freq: Four times a day (QID) | ORAL | Status: DC | PRN

## 2016-06-24 MED ORDER — ONDANSETRON HCL 4 MG/2ML IJ SOLN: 4.0000 mg | Freq: Four times a day (QID) | INTRAMUSCULAR | Status: DC | PRN

## 2016-06-24 MED ORDER — VANCOMYCIN HCL IN DEXTROSE 1-5 GM/200ML-% IV SOLN
1000.0000 mg | Freq: Two times a day (BID) | INTRAVENOUS | Status: DC
  Administered 2016-06-25: 1000 mg via INTRAVENOUS
  Filled 2016-06-24: qty 200

## 2016-06-24 MED ORDER — LEVOTHYROXINE SODIUM 150 MCG PO TABS
150.0000 ug | ORAL_TABLET | Freq: Every day | ORAL | Status: DC
  Administered 2016-06-25 – 2016-06-26 (×2): 150 ug via ORAL
  Filled 2016-06-24: qty 2
  Filled 2016-06-24: qty 1

## 2016-06-24 MED ORDER — SODIUM CHLORIDE 0.9% FLUSH: 3.0000 mL | INTRAVENOUS | Status: DC | PRN

## 2016-06-24 MED ORDER — ESCITALOPRAM OXALATE 20 MG PO TABS
20.0000 mg | ORAL_TABLET | Freq: Every day | ORAL | Status: DC
  Administered 2016-06-25 – 2016-06-26 (×2): 20 mg via ORAL
  Filled 2016-06-24 (×2): qty 1

## 2016-06-24 MED ORDER — PIPERACILLIN-TAZOBACTAM 3.375 G IVPB
3.3750 g | Freq: Three times a day (TID) | INTRAVENOUS | Status: DC
  Administered 2016-06-24: 3.375 g via INTRAVENOUS
  Filled 2016-06-24: qty 50

## 2016-06-24 MED ORDER — FUROSEMIDE 20 MG PO TABS
20.0000 mg | ORAL_TABLET | Freq: Every day | ORAL | Status: DC
  Administered 2016-06-25 – 2016-06-26 (×2): 20 mg via ORAL
  Filled 2016-06-24 (×2): qty 1

## 2016-06-24 MED ORDER — GUAIFENESIN ER 600 MG PO TB12
600.0000 mg | ORAL_TABLET | Freq: Two times a day (BID) | ORAL | Status: DC
  Administered 2016-06-24 – 2016-06-26 (×4): 600 mg via ORAL
  Filled 2016-06-24 (×4): qty 1

## 2016-06-24 MED ORDER — ACETAMINOPHEN 650 MG RE SUPP: 650.0000 mg | Freq: Four times a day (QID) | RECTAL | Status: DC | PRN

## 2016-06-24 MED ORDER — LEVALBUTEROL HCL 0.63 MG/3ML IN NEBU
0.6300 mg | INHALATION_SOLUTION | Freq: Once | RESPIRATORY_TRACT | Status: AC
  Administered 2016-06-24: 0.63 mg via RESPIRATORY_TRACT

## 2016-06-24 NOTE — Progress Notes (Signed)
History of Present Illness [name] is a 82 y.o. female with severe ,COPD, HTN, and diastolic dysfunction, and hypoxemia ( wears nasal oxygen 3 L [place]) folowed by Dr. Lamonte Sakai.   06/24/2016 Acute OV: Pt. Presents to the office for acute worsening shortness of breath. She had a trans-urethral bladder resection for bladder cancer 06/21/2016. She has had a cough prior to surgery.She presents today with cough and confusion. She is gray around the mouth and saturation in the office on 4 L were 67%. She normally wears oxygen at 3 L [place]. She was increased to 6L [place] and saturations rebounded to 96%. Her color improved and she is more alert. She has a nebulizer machine at home and she has not been using her neb treatments. She slept on the floor Saturday night, because she could not get back into her bed which her daughter feels was due to confusion and poor decision making as she had a bell on her bedside table to call her care giver. She has a productive cough with yellow secretions. She had a fever of 99.5 06/23/2016, which the family felt was related to healing from her surgery. She is very congested and wheezing in the office. She will be admitted to [facility] for treatment of Acute on Chronic Respiratory Failure.  Tests   Past medical hx Past Medical History:  Diagnosis Date  . Allergic rhinitis   . Anemia 04/23/2012   "severe @ times; think caused by colonic AVMs"  . Anginal pain ([place])    "pressure"  . Arthritis    "probably"  . Asthma   . Breast cancer ([place])   . COPD (chronic obstructive pulmonary disease) ([place])   . Coronary atherosclerosis of native coronary artery    Cath 1/11: RCA 50-60 ostial o/w normal. EF 70%.   . Dyspnea 04/23/2012   "all the time"  . Edema   . Emphysema   . Emphysema   . Gastric AVM   . H/O hiatal hernia   . Heart murmur   . History of blood transfusion    "many times"  . Hypertension   . Hypertr obst cardiomyop    Echo 10/10 EF 65-70% SW 1.6 PW  1.4 mild SAM no LVOT gradient at rest. Valsalva not performed Mild MR. No hyperenhance ment on MRI EF 69%.  . Hypothyroidism   . Occlusion and stenosis of carotid artery    s/p L CEA u/s 3/11. R 60-79%; L 40-59%. no change since 3/10  . Oxygen dependent 04/23/2012   "concentrated at night; liquid during day"  . Peripheral vascular disease, unspecified   . Pneumonia   . Pulmonary hypertension    Cath 1/11 RA 8, RV 43/6/14, PA 42/17 (29) PCWP 15 Fick  5.0/2.6. PVR 2.8    . Stroke [facility]) ~ 2004   denies residual on 04/23/2012     Past surgical hx, Family hx, Social hx all reviewed.  No current facility-administered medications on file prior to visit.    Current Outpatient Prescriptions on File Prior to Visit  Medication Sig  . albuterol (PROVENTIL HFA;VENTOLIN HFA) 108 (90 Base) MCG/ACT inhaler Inhale 2 puffs into the lungs every 6 (six) hours as needed for wheezing or shortness of breath.  Marland Kitchen atorvastatin (LIPITOR) 20 MG tablet Take 20 mg by mouth daily.    . budesonide-formoterol (SYMBICORT) 160-4.5 MCG/ACT inhaler Inhale 2 puffs into the lungs 2 (two) times daily.  . cetirizine (ZYRTEC) 10 MG tablet Take 10 mg by mouth  daily.  . cholecalciferol (VITAMIN D) 1000 units tablet Take 1,000 Units by mouth daily.  Marland Kitchen dextromethorphan (DELSYM) 30 MG/5ML liquid Take 30 mg by mouth at bedtime as needed for cough.   . escitalopram (LEXAPRO) 20 MG tablet Take 20 mg by mouth daily.   Marland Kitchen levothyroxine (SYNTHROID, LEVOTHROID) 150 MCG tablet Take 150 mcg by mouth daily.   . metoprolol tartrate (LOPRESSOR) 25 MG tablet Take 25 mg by mouth 2 (two) times daily.  . Multiple Vitamin (MULTIVITAMIN WITH MINERALS) TABS tablet Take 1 tablet by mouth daily.  . polyethylene glycol (MIRALAX / GLYCOLAX) packet Take 17 g by mouth daily as needed for mild constipation.  Marland Kitchen tolterodine (DETROL LA) 2 MG 24 hr capsule Take 2 mg by mouth 2 (two) times daily.   . traZODone (DESYREL) 100 MG tablet Take 100 mg by mouth at bedtime.    Marland Kitchen umeclidinium bromide (INCRUSE ELLIPTA) 62.5 MCG/INH AEPB Inhale 1 puff into the lungs at bedtime.  . VOLTAREN 1 % GEL Apply 2 g topically 4 (four) times daily as needed. Apply to hand for pain  . fluticasone (FLONASE) 50 MCG/ACT nasal spray Place 2 sprays into both nostrils daily. (Patient not taking: Reported on 06/24/2016)     Allergies  Allergen Reactions  . Latex Hives and Itching    Review Of Systems:  Constitutional:   No  weight loss, night sweats, + Fevers,+ chills,+ fatigue, or  lassitude.  HEENT:   No headaches,  Difficulty swallowing,  Tooth/dental problems, or  Sore throat,                No sneezing, itching, ear ache, nasal congestion, post nasal drip,   CV:  No chest pain,  Orthopnea, PND, swelling in lower extremities, anasarca, dizziness, palpitations, syncope.   GI  No heartburn, indigestion, abdominal pain, nausea, vomiting, diarrhea, change in bowel habits, loss of appetite, bloody stools.   Resp: + shortness of breath with exertion and  at rest.  + excess mucus, + productive cough,  No non-productive cough,  No coughing up of blood.  + change in color of mucus.  + wheezing.  No chest wall deformity  Skin: no rash or lesions.  GU: no dysuria, change in color of urine, no urgency or frequency.  No flank pain, no hematuria   MS:  No joint pain or swelling.  No decreased range of motion.  No back pain.  Psych:  No change in mood or affect. No depression or anxiety.  No memory loss.   Vital Signs BP 112/60 (BP Location: Left Arm, Cuff Size: Normal)   Pulse 92   SpO2 93%    Physical Exam:  General- No distress,  Intermittently confused ENT: No sinus tenderness, TM clear, pale nasal mucosa, no oral exudate,no post nasal drip, no LAN Cardiac: S1, S2, regular rate and rhythm, no murmur Chest: + wheeze/ + rales R>L / + dullness; no accessory muscle use, no nasal flaring, no sternal retractions Abd.: Soft Non-tender Ext: No clubbing cyanosis, trace edema,  feet cyanotic Neuro:  Confused at intervals Skin: No rashes, warm and dry Psych: aggitated   Assessment/Plan  Acute on chronic respiratory failure with hypoxia (HCC) Saturations of 67% on 4L [place] Concern for HAP/ COPD exacerbation/ pulmonary embolic event Plan: Admit to Med Surg Tele bed [place], NP 06/24/2016  3:39 PM

## 2016-06-24 NOTE — Progress Notes (Signed)
Pt arrived unit as direct admit. Alert and orientated, family at bed side, MD notified of Pt's location. Will continue with current plan of care.

## 2016-06-24 NOTE — Progress Notes (Signed)
Lab called with critical level Troponin of 0.05. RN called and notified Dr Golden Pop office at [facility].

## 2016-06-24 NOTE — Progress Notes (Signed)
Pharmacy Antibiotic Note  [name] is a 82 y.o. female admitted on 06/24/2016 with pneumonia.  Pharmacy has been consulted for Vancomycin & Zosyn dosing.  Plan: Vancomycin 1000mg  IV every 12 hours.  Goal trough 15-20 mcg/mL. Zosyn 3.375g IV q8h (4 hour infusion).  Check Vancomycin trough at steady state Monitor renal function and cx data      Temp (24hrs), Avg:99 F (37.2 C), Min:99 F (37.2 C), Max:99 F (37.2 C)  No results for input(s): WBC, CREATININE, LATICACIDVEN, VANCOTROUGH, VANCOPEAK, VANCORANDOM, GENTTROUGH, GENTPEAK, GENTRANDOM, TOBRATROUGH, TOBRAPEAK, TOBRARND, AMIKACINPEAK, AMIKACINTROU, AMIKACIN in the last 168 hours.  CrCl cannot be calculated (Unknown ideal weight.).    Allergies  Allergen Reactions  . Latex Hives and Itching    Antimicrobials this admission: Vanc 10/9 >>  Zosyn 10/9 >>   Dose adjustments this admission:  Microbiology results: 10/9 UCx: ordered  Thank you for allowing pharmacy to be a part of this patient's care.  Biagio Borg 06/24/2016 1:50 PM

## 2016-06-24 NOTE — H&P (Signed)
Name: [name] MRN: [id number] DOB: March 09, 1933    ADMISSION DATE:  06/24/2016   REFERRING MD :  Chase Caller  CHIEF COMPLAINT:  Confusion, Dyspnea, Desaturations  BRIEF PATIENT DESCRIPTION: Elderly female wearing oxygen, gray in color, dusky lips, intermittent confusion.  SIGNIFICANT EVENTS  06/21/2016:  Transurethral resection for removal of  high grade muscle-invasive bladder tumor cancer at [facility]. ( Dr. Rexene Agent. Ricky Ala).  STUDIES:  CTU 02/27/16 - no evidence of metastatic disease or upper tract involvement.  CT chest 03/03/16 - severe emphysema and a 32mm LLL nodule. PET/CT 05/24/16 ([facility] report) - No definite evidence of FDG-avid metastatic disease.   Echo: 01/2014: EF [id number], PAP ( Systolic) 53    HISTORY OF PRESENT ILLNESS:   Pt. With known  history of COPD ( Home oxygen at 3 L [place]), hypertrophic obstructive cardiomyopathy and aortic stenosis  presented to the office 06/24/16 with her niece for shortness of breath and confusion. She had Transurethral resection for removal of  high grade muscle-invasive urothelial carcinoma of the bladder at  [facility] on 06/21/2016. ( Dr. Rexene Agent. Ricky Ala).She was sent home with a caregiver, but over the weekend became progressively more confused and required higher oxygen flow. Per family patient had a temperature of 99.5 06/23/2016.  Upon assessment in the office today, the patient was dusky, with blue lips.  Saturations were 67% on 4L [place], and the patient had intermittent confusion. Oxygen was increased to 6 L nasal cannula, and patient quickly rebounded to 93%, her color improved and confusion cleared. After further evaluation and discussion with the patient's niece, we determined that the patient needed admission to [facility] for management of suspected HAP with COPD exacerbation. Dr.Ramaswamy staffed the patient, examined her and agreed with the determination that she needed to be admitted.   PAST MEDICAL HISTORY :   has a past medical  history of Allergic rhinitis; Anemia (04/23/2012); Anginal pain ([place]); Arthritis; Asthma; Breast cancer ([place]); COPD (chronic obstructive pulmonary disease) ([place]); Coronary atherosclerosis of native coronary artery; Dyspnea (04/23/2012); Edema; Emphysema; Emphysema; Gastric AVM; H/O hiatal hernia; Heart murmur; History of blood transfusion; Hypertension; Hypertr obst cardiomyop; Hypothyroidism; Occlusion and stenosis of carotid artery; Oxygen dependent (04/23/2012); Peripheral vascular disease, unspecified; Pneumonia; Pulmonary hypertension; and Stroke ([place]) (~ 2004).  has a past surgical history that includes Carotid endarterectomy (2005); Thyroidectomy (2007); Appendectomy (1950); Breast lumpectomy (2002); Dilation and curettage of uterus (1950's); Cataract extraction w/ intraocular lens implant (02/2011); Excisional hemorrhoidectomy (1950's); Cardiac catheterization; and Colonoscopy (N/A, 01/12/2014). Prior to Admission medications   Medication Sig Start Date End Date Taking? Authorizing Provider  albuterol (PROVENTIL HFA;VENTOLIN HFA) 108 (90 Base) MCG/ACT inhaler Inhale 2 puffs into the lungs every 6 (six) hours as needed for wheezing or shortness of breath. 12/18/15   Collene Gobble, MD  atorvastatin (LIPITOR) 20 MG tablet Take 20 mg by mouth daily.      Historical Provider, MD  budesonide-formoterol (SYMBICORT) 160-4.5 MCG/ACT inhaler Inhale 2 puffs into the lungs 2 (two) times daily. 12/18/15   Collene Gobble, MD  cetirizine (ZYRTEC) 10 MG tablet Take 10 mg by mouth daily.    Historical Provider, MD  cholecalciferol (VITAMIN D) 1000 units tablet Take 1,000 Units by mouth daily.    Historical Provider, MD  dextromethorphan (DELSYM) 30 MG/5ML liquid Take 30 mg by mouth at bedtime as needed for cough.     Historical Provider, MD  escitalopram (LEXAPRO) 20 MG tablet Take 20 mg by mouth daily.  Historical Provider, MD  fluticasone (FLONASE) 50 MCG/ACT nasal spray Place 2 sprays into both nostrils daily. Patient  not taking: Reported on 06/24/2016 04/14/15   Collene Gobble, MD  furosemide (LASIX) 20 MG tablet Take 20-40 mg by mouth daily.     Historical Provider, MD  levothyroxine (SYNTHROID, LEVOTHROID) 150 MCG tablet Take 150 mcg by mouth daily.     Historical Provider, MD  metoprolol tartrate (LOPRESSOR) 25 MG tablet Take 25 mg by mouth 2 (two) times daily.    Historical Provider, MD  Multiple Vitamin (MULTIVITAMIN WITH MINERALS) TABS tablet Take 1 tablet by mouth daily.    Historical Provider, MD  polyethylene glycol (MIRALAX / GLYCOLAX) packet Take 17 g by mouth daily as needed for mild constipation.    Historical Provider, MD  tolterodine (DETROL LA) 2 MG 24 hr capsule Take 2 mg by mouth 2 (two) times daily.     Historical Provider, MD  traZODone (DESYREL) 100 MG tablet Take 100 mg by mouth at bedtime.  12/19/15   Historical Provider, MD  umeclidinium bromide (INCRUSE ELLIPTA) 62.5 MCG/INH AEPB Inhale 1 puff into the lungs at bedtime. 12/18/15   Collene Gobble, MD  VOLTAREN 1 % GEL Use as directed 01/17/16   Historical Provider, MD   Allergies  Allergen Reactions  . Latex Hives and Itching    FAMILY HISTORY:  family history includes Asthma in her sister and sister; COPD in her sister; Colon cancer in her brother and mother; Emphysema in her brother, father, and sister; Hypertension in her sister; Stroke in her mother. SOCIAL HISTORY:  reports that she quit smoking about 18 years ago. Her smoking use included Cigarettes. She has a 30.00 pack-year smoking history. She has never used smokeless tobacco. She reports that she does not drink alcohol or use drugs.  REVIEW OF SYSTEMS:   Constitutional: + for fever, chills, weight loss, malaise/fatigue and diaphoresis.  HENT: Negative for hearing loss, ear pain, nosebleeds, congestion, sore throat, neck pain, tinnitus and ear discharge.   Eyes: Negative for blurred vision, double vision, photophobia, pain, discharge and redness.  Respiratory: + for cough, No  hemoptysis, + sputum production,+ shortness of breath, +wheezing and no stridor.   Cardiovascular: Negative for chest pain, palpitations, orthopnea, claudication, leg swelling and PND.  Gastrointestinal: Negative for heartburn, nausea, vomiting, abdominal pain, diarrhea, constipation, blood in stool and melena.  Genitourinary: Negative for dysuria, urgency, frequency, hematuria and flank pain.  Musculoskeletal: Negative for myalgias, back pain, joint pain and falls.  Skin: Negative for itching and rash.  Neurological: Negative for dizziness, tingling, tremors, sensory change, speech change, focal weakness, seizures, loss of consciousness, weakness and headaches, + for confusion  Endo/Heme/Allergies: Negative for environmental allergies and polydipsia. Does not bruise/bleed easily.  SUBJECTIVE:  Pt. States she has felt bad since surgery 05/22/16. She disagrees with her niece that she has been confused.  VITAL SIGNS: Temp:  [99 F (37.2 C)] 99 F (37.2 C) (10/09 1338) Pulse Rate:  [80-92] 80 (10/09 1338) Resp:  [25] 25 (10/09 1338) BP: (110-112)/(60-75) 110/75 (10/09 1338) SpO2:  [93 %-97 %] 97 % (10/09 1338)  Physical Exam:  General- Dusky, pale, elderly female with Intermittent confusion ENT: No sinus tenderness, TM clear, pale nasal mucosa, no oral exudate,no post nasal drip, no LAN Cardiac: S1, S2, regular rate and rhythm, no murmur Chest: + wheeze/ +  Rales R>L ,/+ rhonchi ; no accessory muscle use, no nasal flaring, no sternal retractions, + productive cough with yellow secretions  Abd.: Soft Non-tender Ext: No clubbing + cyanosis, trace edema Neuro: intermittent confusion Skin: No rashes, warm and dry Psych: aggitated   No results for input(s): NA, K, CL, CO2, BUN, CREATININE, GLUCOSE in the last 168 hours. No results for input(s): HGB, HCT, WBC, PLT in the last 168 hours. No results found.   CULTURES: 10/9/: Urine >>  ANTIBIOTICS: 10/9>> Vanc 10/9>> Zosyn  SIGNIFICANT  EVENTS: 06/21/16:Transurethral resection of bladder for removal of  high grade muscle-invasive bladder tumor cancer at [facility]. ( Dr. Rexene Agent. Ricky Ala). 06/24/2016: Admit for Acute on Chronic Hypoxemic Respiratory Failure  LINES/TUBES: 10/9>> NSL  DISCUSSION: Patient with known COPD, and bladder cancer status post transurethral resection of bladder on 06/24/2016 presents to the office with acute on chronic hypoxemic respiratory failure with COPD exacerbation/concern for hospital-acquired pneumonia. Saturations in the office were 67% on 4 L continuous oxygen per nasal cannula  ASSESSMENT / PLAN:  PULMONARY A: Acute on chronic hypoxemic respiratory failure with COPD exacerbation Suspected hospital-acquired pneumonia At risk for embolic disease  P:  Chest x-ray upon admission  Blood Gas upon admission Oxygen to maintain saturations greater than 90% Scheduled BD per nebs Scheduled IV steroids Stat d-dimer, if elevated will need CT Angio and Doppler studies. Antibiotics as above IS while awake Mobilize as able    CARDIOVASCULAR A:  hypertrophic obstructive cardiomyopathy and aortic stenosis  Grade 1 diastolic dysfunction per echo No current evidence of heart failure exacerbation  P:  Twelve-lead EKG upon admission Trend BNP Troponin x 1, trend if elevated Telemetry monitoring Vital signs per unit routine Follow fluid balance Continue home Lipitor, Lopressor, Lasix Consider echo if decompensation   RENAL A:   No acute issue Last creatinine 11/23/2015>> 0.8 P:   CMET upon admission Trend mag and phosphorus Trend BMET daily Replace electrolytes as needed Monitor intake and output  Avoid Nephro toxic drugs   GASTROINTESTINAL A:   No Acute Issues No evidence of bleeding P:   PPI for SUP  HEMATOLOGIC A:   History of AVM and bleeding bladder tumor No evidence of bleeding at present P:  CBC on admission Trend CBC daily Monitor for bleeding Transfuse for  hemoglobin less than 7 SCD's  Lovenox for prophylaxis    INFECTIOUS A:   Suspected hospital-acquired pneumonia P:   Vancomycin per pharmacy Zosyn per pharmacy Procalcitonin and Lactate If chest x-ray positive for pneumonia, will need urine for strep and legionella Trend WBC  Trend fever curve Urine culture on admission   ENDOCRINE A:    No acute issues P:   Follow serum glucose per serum chemistries Add SSI if increase in serum glucose while on steroids  NEUROLOGIC A:   Confusion with hypoxemia which cleared with increased oxygen flow (6 L) and Sats > 90%  P:   Monitoring of mental status for confusion, somnolence or abnormalities by nursing staff   FAMILY  Niece, who was with patient at office visit,  is aware and in support of the plan to admit the patient for further workup of her hypoxemic event. In light of patient's current cancer diagnosis, will need to discuss patient's/ family's goals of care. Based on the fact the patient had recent surgery and is planning on chemoradiation therapy post urethral bladder transection for her cancer , we will proceed with  aggressive care. We will need to have a discussion with the patient regarding her preference of CODE STATUS this admission.  - Inter-disciplinary family meet or Palliative Care meeting due by: Oct.  16.2017.   Magdalen Spatz, AGACNP-BC [facility] Pulmonary/Critical Care Medicine Pager # [phone]  06/24/2016, 1:41 PM

## 2016-06-24 NOTE — Assessment & Plan Note (Signed)
Saturations of 67% on 4L [place] Concern for HAP/ COPD exacerbation/ pulmonary embolic event Plan: Admit to Med Surg Tele bed [facility]

## 2016-06-25 DIAGNOSIS — I5033 Acute on chronic diastolic (congestive) heart failure: Secondary | ICD-10-CM

## 2016-06-25 DIAGNOSIS — J441 Chronic obstructive pulmonary disease with (acute) exacerbation: Secondary | ICD-10-CM

## 2016-06-25 DIAGNOSIS — J810 Acute pulmonary edema: Secondary | ICD-10-CM

## 2016-06-25 LAB — BASIC METABOLIC PANEL
ANION GAP: 8 (ref 5–15)
BUN: 18 mg/dL (ref 6–20)
CALCIUM: 8.5 mg/dL — AB (ref 8.9–10.3)
CO2: 35 mmol/L — AB (ref 22–32)
Chloride: 97 mmol/L — ABNORMAL LOW (ref 101–111)
Creatinine, Ser: 0.63 mg/dL (ref 0.44–1.00)
GFR calc Af Amer: >60 mL/min (ref >60)
GLUCOSE: 208 mg/dL — AB (ref 65–99)
POTASSIUM: 4.1 mmol/L (ref 3.5–5.1)
Sodium: 140 mmol/L (ref 135–145)

## 2016-06-25 LAB — URINE CULTURE
Culture: NO GROWTH
SPECIAL REQUESTS: NORMAL

## 2016-06-25 LAB — BLOOD GAS, ARTERIAL
Acid-Base Excess: 9.5 mmol/L — ABNORMAL HIGH (ref 0.0–2.0)
BICARBONATE: 35.6 mmol/L — AB (ref 20.0–28.0)
Drawn by: 44126
O2 CONTENT: 5 L/min
O2 Saturation: 93.8 %
PATIENT TEMPERATURE: 37
PO2 ART: 67.7 mmHg — AB (ref 83.0–108.0)
pCO2 arterial: 58.1 mmHg — ABNORMAL HIGH (ref 32.0–48.0)
pH, Arterial: 7.404 (ref 7.350–7.450)

## 2016-06-25 LAB — CBC
HEMATOCRIT: 37.3 % (ref 36.0–46.0)
HEMOGLOBIN: 11.5 g/dL — AB (ref 12.0–15.0)
MCH: 30.1 pg (ref 26.0–34.0)
MCHC: 30.8 g/dL (ref 30.0–36.0)
MCV: 97.6 fL (ref 78.0–100.0)
Platelets: 189 10*3/uL (ref 150–400)
RBC: 3.82 MIL/uL — ABNORMAL LOW (ref 3.87–5.11)
RDW: 14.9 % (ref 11.5–15.5)
WBC: 5.2 10*3/uL (ref 4.0–10.5)

## 2016-06-25 MED ORDER — METHYLPREDNISOLONE SODIUM SUCC 40 MG IJ SOLR
20.0000 mg | Freq: Two times a day (BID) | INTRAMUSCULAR | Status: DC
  Administered 2016-06-25 – 2016-06-26 (×2): 20 mg via INTRAVENOUS
  Filled 2016-06-25 (×2): qty 1

## 2016-06-25 MED ORDER — LEVOFLOXACIN IN D5W 750 MG/150ML IV SOLN
750.0000 mg | Freq: Once | INTRAVENOUS | Status: AC
  Administered 2016-06-25: 750 mg via INTRAVENOUS
  Filled 2016-06-25: qty 150

## 2016-06-25 MED ORDER — VITAMIN D3 25 MCG (1000 UNIT) PO TABS
1000.0000 [IU] | ORAL_TABLET | Freq: Every day | ORAL | Status: DC
  Administered 2016-06-25 – 2016-06-26 (×2): 1000 [IU] via ORAL
  Filled 2016-06-25 (×2): qty 1

## 2016-06-25 MED ORDER — POLYETHYLENE GLYCOL 3350 17 G PO PACK: 17.0000 g | PACK | Freq: Every day | ORAL | Status: DC | PRN

## 2016-06-25 MED ORDER — LEVOFLOXACIN 750 MG PO TABS
750.0000 mg | ORAL_TABLET | Freq: Every day | ORAL | Status: DC
  Administered 2016-06-26: 750 mg via ORAL
  Filled 2016-06-25: qty 1

## 2016-06-25 MED ORDER — TRAZODONE HCL 100 MG PO TABS
100.0000 mg | ORAL_TABLET | Freq: Every day | ORAL | Status: DC
  Administered 2016-06-25: 100 mg via ORAL
  Filled 2016-06-25: qty 1

## 2016-06-25 MED ORDER — ADULT MULTIVITAMIN W/MINERALS CH
1.0000 | ORAL_TABLET | Freq: Every day | ORAL | Status: DC
  Administered 2016-06-25: 1 via ORAL
  Filled 2016-06-25: qty 1

## 2016-06-25 MED ORDER — ZOLPIDEM TARTRATE 5 MG PO TABS
5.0000 mg | ORAL_TABLET | Freq: Once | ORAL | Status: AC
  Administered 2016-06-25: 5 mg via ORAL
  Filled 2016-06-25: qty 1

## 2016-06-25 NOTE — Progress Notes (Signed)
PCCM PROGRESS NOTE  Admission Date: 06/24/2016  CC: Short of Breath  Description: 82 yo female former smoker presented to pulmonary office with worsening dyspnea, productive cough, confusion, hypoxia after getting transurethral bladder resection for bladder cancer on 06/21/16.  Past medical history: CVA, COPD, chronic respiratory failure on 3 liters oxygen, pulmonary HTN, PVD, CAD, hypertrophic CM, HTN, hiatal hernia, Gastric AVM, hypothyroidism, breast cancer  Subjective: Feels better.  Still has cough with sputum.  Wheezing better.  Denies chest pain.  Vital signs: BP (!) 146/68 (BP Location: Left Arm)   Pulse 77   Temp 98 F (36.7 C) (Oral)   Resp 18   Ht 5\' 5"  (1.651 m)   Wt 166 lb 4.8 oz (75.4 kg)   SpO2 99%   BMI 27.67 kg/m    Intake/output: I/O last 3 completed shifts: In: 260 [P.O.:240; I.V.:20] Out: 200 [Urine:200]  General: pleasant Neuro: normal strength HEENT: no stridor Cardiac: regular, 2/6 SM Chest: b/l faint crackles and expiratory wheeze Abd: soft, non tender Ext: no edema Skin: no rashes   CMP Latest Ref Rng & Units 06/25/2016 06/24/2016 09/14/2015  Glucose 65 - 99 mg/dL 208(H) 94 110(H)  BUN 6 - 20 mg/dL 18 18 17   Creatinine 0.44 - 1.00 mg/dL 0.63 0.62 0.66  Sodium 135 - 145 mmol/L 140 135 142  Potassium 3.5 - 5.1 mmol/L 4.1 3.6 3.5  Chloride 101 - 111 mmol/L 97(L) 95(L) 102  CO2 22 - 32 mmol/L 35(H) 35(H) 34(H)  Calcium 8.9 - 10.3 mg/dL 8.5(L) 8.2(L) 8.2(L)  Total Protein 6.5 - 8.1 g/dL - 6.9 -  Total Bilirubin 0.3 - 1.2 mg/dL - 0.7 -  Alkaline Phos 38 - 126 U/L - 69 -  AST 15 - 41 U/L - 26 -  ALT 14 - 54 U/L - 14 -    CBC Latest Ref Rng & Units 06/25/2016 06/24/2016 01/18/2016  WBC 4.0 - 10.5 K/uL 5.2 8.2 12.0(H)  Hemoglobin 12.0 - 15.0 g/dL 11.5(L) 11.3(L) 13.7  Hematocrit 36.0 - 46.0 % 37.3 36.9 43.2  Platelets 150 - 400 K/uL 189 192 278    Portable Chest 1 View  Result Date: 06/24/2016 CLINICAL DATA:  Initial evaluation for acute  shortness of breath, cough, smoker. EXAM: PORTABLE CHEST 1 VIEW COMPARISON:  Prior radiograph from 04/02/2016. FINDINGS: Mild cardiomegaly stable. Mediastinal silhouette within normal limits. Aortic atherosclerosis noted. Lungs mildly hypoinflated. Diffuse bibasilar interstitial prominence is slightly increased from prior, suspected to reflect underlying pulmonary interstitial edema. Changes are superimposed on the underlying emphysematous changes. No definite focal infiltrates. Trace right pleural effusion. No pneumothorax. No acute osseous abnormality. IMPRESSION: 1. Increased prominence of the bibasilar interstitial markings, favored to reflect mild pulmonary interstitial edema. 2. Trace right pleural effusion. 3. Emphysema. 4. Aortic atherosclerosis. Electronically Signed   By: Jeannine Boga M.D.   On: 06/24/2016 14:06    ABG    Component Value Date/Time   PHART 7.404 06/24/2016 1358   PCO2ART 58.1 (H) 06/24/2016 1358   PO2ART 67.7 (L) 06/24/2016 1358   HCO3 35.6 (H) 06/24/2016 1358   TCO2 34 09/18/2009 1102   O2SAT 93.8 06/24/2016 1358    Lab Results  Component Value Date   DDIMER <0.27 06/24/2016     Recent Labs  06/24/16 1413  BNP 304.4*    Studies: Echo 01/17/14 >> mod LVH, EF 65 to 70%, mod AS, mild MS, mild/mod LA dilation, PAS 42 mmHg PFT 04/15/16 >> FEV1 1.08 (54%), FEV1% 59, TLC 4.83 (92%), DLCO 28%  Cultures: Urine 10/09 >>  Antibiotics:  Vancomycin 10/09 >> 10/10 Zosyn 10/09 >> 10/10 Levaquin 10/10 >>   Summary: 82 yo female with acute on chronic hypoxic/hypercapnic respiratory failure from AECOPD and acute pulmonary edema.  Assessment:  Acute on chronic hypoxic/hypercapnic respiratory failure. - oxygen to keep SpO2 88 to 95%  AECOPD. - change Abx to levaquin - scheduled BDs - change solumedrol to 20 mg q12h  Acute pulmonary edema. Acute on chronic diastolic CHF. Hx of hypertrophic CM, AS, CAD, HTN, HLD. - diurese as able - control HR >>  continue lopressor - continue lipitor  Hx of hypothyroidism. - continue synthroid  Hx of depression. - continue lexapro  Disposition. - tentative plan for d/c home 10/11  Chesley Mires, MD [place] 06/25/2016, 8:22 AM Pager:  [phone] After 3pm call: [phone]

## 2016-06-25 NOTE — Progress Notes (Signed)
Patient states she could not sleep last night and requests RN contact MD for something for sleep. Has Trazodone ordered scheduled, but patient states this is one of her regular medications so does not help much with sleep. States she has successfully taken Ambien in the hospital in the past to help with sleep. Will page on call MD. Will c/t monitor.

## 2016-06-25 NOTE — Progress Notes (Signed)
[place] Progress Note Patient Name: [name] DOB: 1932-11-09 MRN: [id number]   Date of Service  06/25/2016  HPI/Events of Note  Poor sleep  eICU Interventions  ambien 5mg  x 1      Intervention Category Evaluation Type: Other  Emberley Kral S. 06/25/2016, 8:20 PM

## 2016-06-25 NOTE — Progress Notes (Addendum)
Pharmacy Antibiotic Note  [name] is a 82 y.o. female admitted on 06/24/2016 with COPD exacerbation.  Pharmacy has been consulted for Vancomycin & Zosyn dosing which is being de-escalated to Levaquin today. 06/25/2016:  Afeb, WBC wnl, PCT<0.1  Scr at baseline  Plan: Change antibiotics to Levaquin 750mg  daily- will give 1st dose IV then change to PO starting tomorrow.  Monitor renal function and cx data  Recommend 5 days of therapy No dose adjustments anticipated- pharmacy to sign off.   Height: 5\' 5"  (165.1 cm) Weight: 166 lb 4.8 oz (75.4 kg) IBW/kg (Calculated) : 57  Temp (24hrs), Avg:98.3 F (36.8 C), Min:98 F (36.7 C), Max:99 F (37.2 C)   Recent Labs Lab 06/24/16 1413 06/25/16 0517  WBC 8.2 5.2  CREATININE 0.62 0.63  LATICACIDVEN 1.2  --     Estimated Creatinine Clearance: 56.1 mL/min (by C-G formula based on SCr of 0.63 mg/dL).    Allergies  Allergen Reactions  . Latex Hives and Itching    Antimicrobials this admission: Vanc 10/9 >> 10/10 Zosyn 10/9 >> 10/10 Levaquin 10/10>>  Dose adjustments this admission:  Microbiology results: 10/9 UCx: ordered  Thank you for allowing pharmacy to be a part of this patient's care.  Biagio Borg 06/25/2016 8:05 AM

## 2016-06-26 MED ORDER — LEVOFLOXACIN 750 MG PO TABS: 750.0000 mg | ORAL_TABLET | Freq: Every day | ORAL | 0 refills | Status: DC

## 2016-06-26 MED ORDER — FLUTICASONE FUROATE-VILANTEROL 200-25 MCG/INH IN AEPB
1.0000 | INHALATION_SPRAY | Freq: Every day | RESPIRATORY_TRACT | Status: DC
  Administered 2016-06-26: 1 via RESPIRATORY_TRACT
  Filled 2016-06-26: qty 28

## 2016-06-26 MED ORDER — FUROSEMIDE 40 MG PO TABS: 40.0000 mg | ORAL_TABLET | Freq: Every day | ORAL | 0 refills | Status: DC | PRN

## 2016-06-26 MED ORDER — PREDNISONE 20 MG PO TABS: ORAL_TABLET | ORAL | 0 refills | Status: DC

## 2016-06-26 MED ORDER — IPRATROPIUM-ALBUTEROL 0.5-2.5 (3) MG/3ML IN SOLN
3.0000 mL | RESPIRATORY_TRACT | Status: DC | PRN
  Administered 2016-06-26: 3 mL via RESPIRATORY_TRACT
  Filled 2016-06-26: qty 3

## 2016-06-26 MED ORDER — PREDNISONE 20 MG PO TABS: 20.0000 mg | ORAL_TABLET | Freq: Every day | ORAL | Status: DC

## 2016-06-26 MED ORDER — UMECLIDINIUM BROMIDE 62.5 MCG/INH IN AEPB
1.0000 | INHALATION_SPRAY | Freq: Every day | RESPIRATORY_TRACT | Status: DC
  Filled 2016-06-26: qty 7

## 2016-06-26 NOTE — Progress Notes (Addendum)
PCCM PROGRESS NOTE  Admission Date: 06/24/2016  CC: Short of Breath  Description: 82 yo female former smoker presented to pulmonary office with worsening dyspnea, productive cough, confusion, hypoxia after getting transurethral bladder resection for bladder cancer on 06/21/16.  Past medical history: CVA, COPD, chronic respiratory failure on 3 liters oxygen, pulmonary HTN, PVD, CAD, hypertrophic CM, HTN, hiatal hernia, Gastric AVM, hypothyroidism, breast cancer  Subjective: Had trouble sleeping last night >> got ambien.  Breathing better.  Still has cough, but decreased sputum.  Anxious to go home.  Vital signs: BP (!) 159/62 (BP Location: Left Arm)   Pulse 73   Temp 98.5 F (36.9 C) (Oral)   Resp 20   Ht 5\' 5"  (1.651 m)   Wt 166 lb 4.8 oz (75.4 kg)   SpO2 97%   BMI 27.67 kg/m    Intake/output: I/O last 3 completed shifts: In: 396 [P.O.:390; I.V.:6] Out: -   General: pleasant Neuro: normal strength HEENT: no stridor Cardiac: regular, 2/6 SM Chest: better air movement, no crackles Abd: soft, non tender Ext: no edema Skin: no rashes   CMP Latest Ref Rng & Units 06/25/2016 06/24/2016 09/14/2015  Glucose 65 - 99 mg/dL 208(H) 94 110(H)  BUN 6 - 20 mg/dL 18 18 17   Creatinine 0.44 - 1.00 mg/dL 0.63 0.62 0.66  Sodium 135 - 145 mmol/L 140 135 142  Potassium 3.5 - 5.1 mmol/L 4.1 3.6 3.5  Chloride 101 - 111 mmol/L 97(L) 95(L) 102  CO2 22 - 32 mmol/L 35(H) 35(H) 34(H)  Calcium 8.9 - 10.3 mg/dL 8.5(L) 8.2(L) 8.2(L)  Total Protein 6.5 - 8.1 g/dL - 6.9 -  Total Bilirubin 0.3 - 1.2 mg/dL - 0.7 -  Alkaline Phos 38 - 126 U/L - 69 -  AST 15 - 41 U/L - 26 -  ALT 14 - 54 U/L - 14 -    CBC Latest Ref Rng & Units 06/25/2016 06/24/2016 01/18/2016  WBC 4.0 - 10.5 K/uL 5.2 8.2 12.0(H)  Hemoglobin 12.0 - 15.0 g/dL 11.5(L) 11.3(L) 13.7  Hematocrit 36.0 - 46.0 % 37.3 36.9 43.2  Platelets 150 - 400 K/uL 189 192 278    Portable Chest 1 View  Result Date: 06/24/2016 CLINICAL DATA:   Initial evaluation for acute shortness of breath, cough, smoker. EXAM: PORTABLE CHEST 1 VIEW COMPARISON:  Prior radiograph from 04/02/2016. FINDINGS: Mild cardiomegaly stable. Mediastinal silhouette within normal limits. Aortic atherosclerosis noted. Lungs mildly hypoinflated. Diffuse bibasilar interstitial prominence is slightly increased from prior, suspected to reflect underlying pulmonary interstitial edema. Changes are superimposed on the underlying emphysematous changes. No definite focal infiltrates. Trace right pleural effusion. No pneumothorax. No acute osseous abnormality. IMPRESSION: 1. Increased prominence of the bibasilar interstitial markings, favored to reflect mild pulmonary interstitial edema. 2. Trace right pleural effusion. 3. Emphysema. 4. Aortic atherosclerosis. Electronically Signed   By: Jeannine Boga M.D.   On: 06/24/2016 14:06    ABG    Component Value Date/Time   PHART 7.404 06/24/2016 1358   PCO2ART 58.1 (H) 06/24/2016 1358   PO2ART 67.7 (L) 06/24/2016 1358   HCO3 35.6 (H) 06/24/2016 1358   TCO2 34 09/18/2009 1102   O2SAT 93.8 06/24/2016 1358    Lab Results  Component Value Date   DDIMER <0.27 06/24/2016     Recent Labs  06/24/16 1413  BNP 304.4*    Studies: Echo 01/17/14 >> mod LVH, EF 65 to 70%, mod AS, mild MS, mild/mod LA dilation, PAS 42 mmHg PFT 04/15/16 >> FEV1 1.08 (54%), FEV1%  59, TLC 4.83 (92%), DLCO 28%  Cultures: Urine 10/09 >> negative  Antibiotics:  Vancomycin 10/09 >> 10/10 Zosyn 10/09 >> 10/10 Levaquin 10/10 >>   Summary: 82 yo female with acute on chronic hypoxic/hypercapnic respiratory failure from AECOPD and acute pulmonary edema.  Assessment:  Acute on chronic hypoxic/hypercapnic respiratory failure. - oxygen to keep SpO2 88 to 95% >> back to baseline of 3 liters oxygen  AECOPD. - Day 3/7 of Abx, currently on levaquin - incruse, breo (uses symbicort as outpt) - prn duoneb - change to prednisone 20 mg daily and taper  off over next 5 days  Acute pulmonary edema. Acute on chronic diastolic CHF. Hx of hypertrophic CM, AS, CAD, HTN, HLD. - lasix as needed for edema and/or weight gain  - control HR >> continue lopressor - continue lipitor - f/u with cardiology at [facility] (Dr. Kerry Dory)  Hx of hypothyroidism. - continue synthroid  Hx of depression. - continue lexapro  Bladder cancer s/p transurethral bladder resection for bladder cancer on 06/21/16. - f/u with Dr. Vito Berger at [facility]  Disposition. - d/c home 10/11 - will need f/u with Dr. Lamonte Sakai or NP within 1 week   Chesley Mires, MD [place] 06/26/2016, 8:55 AM Pager:  [phone] After 3pm call: [phone]

## 2016-06-26 NOTE — Progress Notes (Signed)
Date: June 26, 2016 Discharge orders checked for needs. [employer] contacted for travel 02 canister to be brought to hospital at 1000/misty with company states that a [place] contacted them and the tank is on the way/patient called and notified of this. Velva Harman, RN, BSN, [place]   [phone]

## 2016-06-26 NOTE — Discharge Summary (Signed)
Physician Discharge Summary  Patient ID: [name] MRN: [id number] DOB/AGE: [birth year] 80 y.o.  Admit date: 06/24/2016 Discharge date: 06/26/2016    Discharge Diagnoses:  Acute on Chronic Hypoxic / Hypercapnic Respiratory Failure  Acute Exacerbation of COPD  Acute Pulmonary Edema  Acute on Chronic Diastolic CHF  Hx Hypertrophic CM, AS, CAD, HTN, HLD  Hypothyroidism  Depression  Bladder Cancer s/p Transurethral Bladder Resection for Bladder Cancer 06/21/16                                                                      DISCHARGE PLAN BY DIAGNOSIS     Acute on chronic hypoxic/hypercapnic respiratory failure.  Discharge Plan: - oxygen to keep SpO2 88 to 95% >> back to baseline of 3 liters oxygen  AECOPD.  Discharge Plan: - Day 3/7 of Abx, currently on levaquin - resume incruse, symbicort  - prn albuterol  - change to prednisone 20 mg daily and taper off over next 5 days - continue cetirizine QD, flonase BID, delsym PRN  Acute pulmonary edema. Acute on chronic diastolic CHF. Hx of hypertrophic CM, AS, CAD, HTN, HLD.  Discharge Plan: - lasix as needed for edema and/or weight gain  - control HR >> continue lopressor - continue lipitor - f/u with [facility]ology at [facility] (Dr. Kerry Dory)  Hx of hypothyroidism.  Discharge Plan: - continue synthroid  Hx of depression.  Discharge Plan: - continue lexapro  Bladder cancer s/p transurethral bladder resection for bladder cancer on 06/21/16.  Discharge Plan: - f/u with Dr. Vito Berger at [facility] - continue detrol                  DISCHARGE SUMMARY   [name] is a 82 y.o. y/o female, former smoker, with a PMH of CVA, COPD, chronic respiratory failure on 3 liters oxygen, pulmonary HTN, PVD, CAD, hypertrophic CM, HTN, hiatal hernia, Gastric AVM, hypothyroidism, & breast cancer who presented to the pulmonary office on 10/9 with shortness of breath and confusion.    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 (Dr. Rexene Agent. Ricky Ala).  She was sent home with a caregiver, but over the weekend became progressively more confused and required higher oxygen flow. Per family patient had a temperature of 99.5 06/23/2016. Upon assessment in the office, the patient was dusky, with blue lips.  Saturations were 67% on 4L [place], and the patient had intermittent confusion. Oxygen was increased to 6 L nasal cannula, and patient quickly rebounded to 93%, her color improved and confusion cleared. After further evaluation and discussion with the patient's niece, we determined that the patient needed admission to [facility] for management of suspected HAP with COPD exacerbation. Dr.Ramaswamy agreed with admission.  The patient was directly admitted to [facility] for further evaluation.  She was treated with empiric antibiotics, nebulized bronchodilators, steroids and oxygen.  VTE was ruled out.  CXR did not show acute infiltrate. Urine culture was negative for infectious process. Work up was consistent with acute on chronic hypoxic/hypercapnic respiratory failure from AECOPD and acute pulmonary edema.  The patient slowly improved and was medically cleared for discharge 10/11 with plans as above.  Studies: Echo 01/17/14 >> mod LVH, EF 65 to 70%, mod AS, mild MS, mild/mod LA dilation, PAS 42 mmHg PFT 04/15/16 >> FEV1 1.08 (54%), FEV1% 59, TLC 4.83 (92%), DLCO 28%  Cultures: Urine 10/09 >> negative  Antibiotics:  Vancomycin 10/09 >> 10/10 Zosyn 10/09 >> 10/10 Levaquin 10/10 >>    Discharge Exam: General: pleasant Neuro: normal strength HEENT: no stridor Cardiac: regular, 2/6 SM Chest: better air movement, no crackles Abd: soft, non tender Ext: no edema Skin: no rashes  Vitals:   06/25/16 2230 06/26/16 0440 06/26/16 0816 06/26/16 0819  BP: 138/74 (!) 159/62    Pulse: 89 73    Resp: 18 20    Temp: 98 F (36.7 C)  98.5 F (36.9 C)    TempSrc: Oral Oral    SpO2: 96% 96% 97% 97%  Weight:      Height:         Discharge Labs  BMET  Recent Labs Lab 06/24/16 1413 06/25/16 0517  NA 135 140  K 3.6 4.1  CL 95* 97*  CO2 35* 35*  GLUCOSE 94 208*  BUN 18 18  CREATININE 0.62 0.63  CALCIUM 8.2* 8.5*  MG 2.3  --   PHOS 2.6  --     CBC  Recent Labs Lab 06/24/16 1413 06/25/16 0517  HGB 11.3* 11.5*  HCT 36.9 37.3  WBC 8.2 5.2  PLT 192 189    Discharge Instructions    (HEART FAILURE PATIENTS) Call MD:  Anytime you have any of the following symptoms: 1) 3 pound weight gain in 24 hours or 5 pounds in 1 week 2) shortness of breath, with or without a dry hacking cough 3) swelling in the hands, feet or stomach 4) if you have to sleep on extra pillows at night in order to breathe.    Complete by:  As directed    Call MD for:  difficulty breathing, headache or visual disturbances    Complete by:  As directed    Call MD for:  extreme fatigue    Complete by:  As directed    Call MD for:  hives    Complete by:  As directed    Call MD for:  persistant dizziness or light-headedness    Complete by:  As directed    Call MD for:  persistant nausea and vomiting    Complete by:  As directed    Call MD for:  redness, tenderness, or signs of infection (pain, swelling, redness, odor or green/yellow discharge around incision site)    Complete by:  As directed    Call MD for:  severe uncontrolled pain    Complete by:  As directed    Call MD for:  temperature >100.4    Complete by:  As directed    Diet - low sodium heart healthy    Complete by:  As directed    Discharge instructions    Complete by:  As directed    1.  Continue oxygen at 3L continuously  2.  Review your medications carefully as they have changed > see discharge medication list  3.  Follow up with Cardiology & Oncology at [facility] as previously arranged 4.  Follow up with Dr. Agustina Caroli office next week as scheduled  5.  Return / call  immediately for new or worsening symptoms   Increase activity slowly    Complete by:  As directed        Follow-up Information    Tammy Parrett,  NP Follow up on 07/03/2016.   Specialty:  Pulmonary Disease Why:  Appt at 11:00 AM  Contact information: [address] [place] [address] [phone]              Medication List    TAKE these medications   albuterol 108 (90 Base) MCG/ACT inhaler Commonly known as:  PROVENTIL HFA;VENTOLIN HFA Inhale 2 puffs into the lungs every 6 (six) hours as needed for wheezing or shortness of breath.   atorvastatin 20 MG tablet Commonly known as:  LIPITOR Take 20 mg by mouth daily.   budesonide-formoterol 160-4.5 MCG/ACT inhaler Commonly known as:  SYMBICORT Inhale 2 puffs into the lungs 2 (two) times daily.   cetirizine 10 MG tablet Commonly known as:  ZYRTEC Take 10 mg by mouth daily.   cholecalciferol 1000 units tablet Commonly known as:  VITAMIN D Take 1,000 Units by mouth daily.   dextromethorphan 30 MG/5ML liquid Commonly known as:  DELSYM Take 30 mg by mouth at bedtime as needed for cough.   escitalopram 20 MG tablet Commonly known as:  LEXAPRO Take 20 mg by mouth daily.   fluticasone 50 MCG/ACT nasal spray Commonly known as:  FLONASE Place 2 sprays into both nostrils daily.   furosemide 40 MG tablet Commonly known as:  LASIX Take 1 tablet (40 mg total) by mouth daily as needed for fluid or edema (Increased lower extremity swelling or weight gain of 3lbs in 24 hours / 5 lbs in one week). What changed:  when to take this  reasons to take this   levofloxacin 750 MG tablet Commonly known as:  LEVAQUIN Take 1 tablet (750 mg total) by mouth daily. Start taking on:  06/27/2016   levothyroxine 150 MCG tablet Commonly known as:  SYNTHROID, LEVOTHROID Take 150 mcg by mouth daily.   metoprolol tartrate 25 MG tablet Commonly known as:  LOPRESSOR Take 25 mg by mouth 2 (two) times daily.   multivitamin  with minerals Tabs tablet Take 1 tablet by mouth daily.   polyethylene glycol packet Commonly known as:  MIRALAX / GLYCOLAX Take 17 g by mouth daily as needed for mild constipation.   predniSONE 20 MG tablet Commonly known as:  DELTASONE 1 tablet daily for 3 days, then 1/2 tablet daily for 2 days and stop   tolterodine 2 MG 24 hr capsule Commonly known as:  DETROL LA Take 2 mg by mouth 2 (two) times daily.   traZODone 100 MG tablet Commonly known as:  DESYREL Take 100 mg by mouth at bedtime.   umeclidinium bromide 62.5 MCG/INH Aepb Commonly known as:  INCRUSE ELLIPTA Inhale 1 puff into the lungs at bedtime.   VOLTAREN 1 % Gel Generic drug:  diclofenac sodium Apply 2 g topically 4 (four) times daily as needed. Apply to hand for pain         Disposition:  Home.  No new home health needs identified at time of discharge.  Continue home O2 as previously prescribed, 3L per [place].    Discharged Condition: Adelene Polivka has met maximum benefit of inpatient care and is medically stable and cleared for discharge.  Patient is pending follow up as above.      Time spent on disposition:  Greater than 35 minutes.   Signed: Noe Gens, NP-C [facility] Pgr: [phone] Office: [place], MD [place] 06/26/2016, 10:58 AM Pager:  [phone] After 3pm call: [phone]

## 2016-06-28 ENCOUNTER — Telehealth: Payer: Self-pay | Admitting: Emergency Medicine

## 2016-06-28 ENCOUNTER — Ambulatory Visit: Payer: Medicare Other | Admitting: Emergency Medicine

## 2016-06-28 ENCOUNTER — Ambulatory Visit (INDEPENDENT_AMBULATORY_CARE_PROVIDER_SITE_OTHER): Payer: Medicare Other | Admitting: Adult Health

## 2016-06-28 ENCOUNTER — Encounter: Payer: Self-pay | Admitting: Adult Health

## 2016-06-28 DIAGNOSIS — J441 Chronic obstructive pulmonary disease with (acute) exacerbation: Secondary | ICD-10-CM | POA: Diagnosis not present

## 2016-06-28 MED ORDER — HYDROCODONE-HOMATROPINE 5-1.5 MG/5ML PO SYRP: 5.0000 mL | ORAL_SOLUTION | Freq: Two times a day (BID) | ORAL | 0 refills | Status: DC | PRN

## 2016-06-28 MED ORDER — ALBUTEROL SULFATE HFA 108 (90 BASE) MCG/ACT IN AERS: 2.0000 | INHALATION_SPRAY | Freq: Four times a day (QID) | RESPIRATORY_TRACT | 5 refills | Status: DC | PRN

## 2016-06-28 MED ORDER — UMECLIDINIUM BROMIDE 62.5 MCG/INH IN AEPB: 1.0000 | INHALATION_SPRAY | Freq: Every day | RESPIRATORY_TRACT | 5 refills | Status: DC

## 2016-06-28 MED ORDER — BUDESONIDE-FORMOTEROL FUMARATE 160-4.5 MCG/ACT IN AERO: 2.0000 | INHALATION_SPRAY | Freq: Two times a day (BID) | RESPIRATORY_TRACT | 5 refills | Status: DC

## 2016-06-28 MED ORDER — BENZONATATE 200 MG PO CAPS: 200.0000 mg | ORAL_CAPSULE | Freq: Three times a day (TID) | ORAL | 1 refills | Status: DC | PRN

## 2016-06-28 NOTE — Progress Notes (Signed)
Subjective:    Patient ID: [name], female    DOB: April 01, 1933, 82 y.o.   MRN: [id number]  HPI 82 year old female former smoker with severe COPD, diastolic congestive heart  06/28/2016 post hospital follow up  Patient returns for a post hospital follow-up. Patient was admitted earlier this week with a COPD exacerbation . She was treated with IV antibiotics and steroids. She was discharged on Levaquin and a prednisone taper. She is feeling some better but says that her cough is keeping her up at night   Denies chest congestion/tightness, sinus pressure/drainage, fever, nausea or vomiting.  She remains on Symbicort twice daily and Incruse daily . She has 2-3 days left of Levaquin and prednisone. She denies any hemoptysis, fever, abdominal pain, nausea, vomiting or diarrhea. Would Like something for cough. She is using Delsym without much relief Has recently been found to have a bladder tumor underwent resection that is c/w bladder cancer.  She is planning chemo/radiation per daughter.    Past Medical History:  Diagnosis Date  . Allergic rhinitis   . Anemia 04/23/2012   "severe @ times; think caused by colonic AVMs"  . Anginal pain ([place])    "pressure"  . Arthritis    "probably"  . Asthma   . Breast cancer ([place])   . COPD (chronic obstructive pulmonary disease) ([place])   . Coronary atherosclerosis of native coronary artery    Cath 1/11: RCA 50-60 ostial o/w normal. EF 70%.   . Dyspnea 04/23/2012   "all the time"  . Edema   . Emphysema   . Emphysema   . Gastric AVM   . H/O hiatal hernia   . Heart murmur   . History of blood transfusion    "many times"  . Hypertension   . Hypertr obst cardiomyop    Echo 10/10 EF 65-70% SW 1.6 PW 1.4 mild SAM no LVOT gradient at rest. Valsalva not performed Mild MR. No hyperenhance ment on MRI EF 69%.  . Hypothyroidism   . Occlusion and stenosis of carotid artery    s/p L CEA u/s 3/11. R 60-79%; L 40-59%. no change since 3/10  . Oxygen  dependent 04/23/2012   "concentrated at night; liquid during day"  . Peripheral vascular disease, unspecified   . Pneumonia   . Pulmonary hypertension    Cath 1/11 RA 8, RV 43/6/14, PA 42/17 (29) PCWP 15 Fick  5.0/2.6. PVR 2.8    . Stroke [facility]) ~ 2004   denies residual on 04/23/2012    Current Outpatient Prescriptions on File Prior to Visit  Medication Sig Dispense Refill  . albuterol (PROVENTIL HFA;VENTOLIN HFA) 108 (90 Base) MCG/ACT inhaler Inhale 2 puffs into the lungs every 6 (six) hours as needed for wheezing or shortness of breath. 1 Inhaler 11  . atorvastatin (LIPITOR) 20 MG tablet Take 20 mg by mouth daily.      . budesonide-formoterol (SYMBICORT) 160-4.5 MCG/ACT inhaler Inhale 2 puffs into the lungs 2 (two) times daily. 10.2 g 11  . cetirizine (ZYRTEC) 10 MG tablet Take 10 mg by mouth daily.    . cholecalciferol (VITAMIN D) 1000 units tablet Take 1,000 Units by mouth daily.    Marland Kitchen dextromethorphan (DELSYM) 30 MG/5ML liquid Take 30 mg by mouth at bedtime as needed for cough.     . escitalopram (LEXAPRO) 20 MG tablet Take 20 mg by mouth daily.     . furosemide (LASIX) 40 MG tablet Take 1 tablet (40 mg total) by mouth daily as  needed for fluid or edema (Increased lower extremity swelling or weight gain of 3lbs in 24 hours / 5 lbs in one week). 30 tablet 0  . levofloxacin (LEVAQUIN) 750 MG tablet Take 1 tablet (750 mg total) by mouth daily. 4 tablet 0  . levothyroxine (SYNTHROID, LEVOTHROID) 150 MCG tablet Take 150 mcg by mouth daily.     . metoprolol tartrate (LOPRESSOR) 25 MG tablet Take 25 mg by mouth 2 (two) times daily.    . Multiple Vitamin (MULTIVITAMIN WITH MINERALS) TABS tablet Take 1 tablet by mouth daily.    . polyethylene glycol (MIRALAX / GLYCOLAX) packet Take 17 g by mouth daily as needed for mild constipation.    . predniSONE (DELTASONE) 20 MG tablet 1 tablet daily for 3 days, then 1/2 tablet daily for 2 days and stop 4 tablet 0  . tolterodine (DETROL LA) 2 MG 24 hr capsule  Take 2 mg by mouth 2 (two) times daily.     . traZODone (DESYREL) 100 MG tablet Take 100 mg by mouth at bedtime.     Marland Kitchen umeclidinium bromide (INCRUSE ELLIPTA) 62.5 MCG/INH AEPB Inhale 1 puff into the lungs at bedtime. 30 each 11  . VOLTAREN 1 % GEL Apply 2 g topically 4 (four) times daily as needed. Apply to hand for pain    . fluticasone (FLONASE) 50 MCG/ACT nasal spray Place 2 sprays into both nostrils daily. (Patient not taking: Reported on 06/28/2016) 16 g 5   No current facility-administered medications on file prior to visit.        Review of Systems Constitutional:   No  weight loss, night sweats,  Fevers, chills,+ fatigue, or  lassitude.  HEENT:   No headaches,  Difficulty swallowing,  Tooth/dental problems, or  Sore throat,                No sneezing, itching, ear ache, nasal congestion, post nasal drip,   CV:  No chest pain,  Orthopnea, PND, swelling in lower extremities, anasarca, dizziness, palpitations, syncope.   GI  No heartburn, indigestion, abdominal pain, nausea, vomiting, diarrhea, change in bowel habits, loss of appetite, bloody stools.   Resp:  .  No chest wall deformity  Skin: no rash or lesions.  GU: no dysuria, change in color of urine, no urgency or frequency.  No flank pain, no hematuria   MS:  No joint pain or swelling.  No decreased range of motion.  No back pain.  Psych:  No change in mood or affect. No depression or anxiety.  No memory loss.         Objective:   Physical Exam Vitals:   06/28/16 1257  BP: 130/74  Pulse: 94  Temp: 98.4 F (36.9 C)  TempSrc: Oral  SpO2: 95%  Weight: 164 lb (74.4 kg)  Height: 5\' 5"  (1.651 m)   GEN: A/Ox3; pleasant , NAD, elderly , on O2    HEENT:  [place]/AT,  EACs-clear, TMs-wnl, NOSE-clear, THROAT-clear, no lesions, no postnasal drip or exudate noted.   NECK:  Supple w/ fair ROM; no JVD; normal carotid impulses w/o bruits; no thyromegaly or nodules palpated; no lymphadenopathy.    RESP  Decreased BS in  bases , . no accessory muscle use, no dullness to percussion  CARD:  RRR, no m/r/g  , no peripheral edema, pulses intact, no cyanosis or clubbing.  GI:   Soft & nt; nml bowel sounds; no organomegaly or masses detected.   Musco: Warm bil, no deformities or joint  swelling noted.   Neuro: alert, no focal deficits noted.    Skin: Warm, no lesions or rashes  Tammy Parrett NP-C  [facility]  06/28/2016

## 2016-06-28 NOTE — Telephone Encounter (Signed)
Spoke with RB and LL about this message, they are ok with this double book.  Will close encounter.

## 2016-06-28 NOTE — Assessment & Plan Note (Signed)
Recent exacerbation, slowly improving with residual cough  Plan  Patient Instructions  Finish Levaquin and Prednisone taper as directed.  Delsym  2 tsp Twice daily  For cough , as needed.  May use Tessalon 200mg  Three times a day  For cough , as needed.  Hydromet 1/2 -1 tsp every 12 hr as needed for cough . May make you sleepy.  Please contact office for sooner follow up if symptoms do not improve or worsen or seek emergency care  Follow up in 1 week and As needed

## 2016-06-28 NOTE — Patient Instructions (Addendum)
Finish Levaquin and Prednisone taper as directed.  Delsym  2 tsp Twice daily  For cough , as needed.  May use Tessalon 200mg  Three times a day  For cough , as needed.  Hydromet 1/2 -1 tsp every 12 hr as needed for cough . May make you sleepy.  Please contact office for sooner follow up if symptoms do not improve or worsen or seek emergency care  Follow up in 1 week and As needed

## 2016-07-03 ENCOUNTER — Inpatient Hospital Stay: Payer: Medicare Other | Admitting: Adult Health

## 2016-07-05 ENCOUNTER — Encounter: Payer: Self-pay | Admitting: Emergency Medicine

## 2016-07-05 ENCOUNTER — Ambulatory Visit (INDEPENDENT_AMBULATORY_CARE_PROVIDER_SITE_OTHER): Payer: Medicare Other | Admitting: Emergency Medicine

## 2016-07-05 ENCOUNTER — Telehealth: Payer: Self-pay | Admitting: Emergency Medicine

## 2016-07-05 DIAGNOSIS — J441 Chronic obstructive pulmonary disease with (acute) exacerbation: Secondary | ICD-10-CM | POA: Diagnosis not present

## 2016-07-05 MED ORDER — HYDROCODONE-HOMATROPINE 5-1.5 MG/5ML PO SYRP: 5.0000 mL | ORAL_SOLUTION | Freq: Two times a day (BID) | ORAL | 0 refills | Status: DC | PRN

## 2016-07-05 NOTE — Assessment & Plan Note (Signed)
We did broach the severity of her disease, CODE STATUS. I have recommended that they think about this. I believe that she would tolerate mechanical ventilation poorly especially in the setting of an acute exacerbation. I will recommend that she be DO NOT RESUSCITATE DO NOT INTUBATE. Like to think about this. There would also like for me to discuss her current status with urology at [facility] give our input regarding her suitability for the therapy and they have planned. I'll be glad to do this, they will get me the information.  Please start taking your flonase 2 sprays each side daily. You could consider increasing to twice a day if it is helping.  Continue zyrtec Add mucinex 600mg  twice a day for 2 weeks.  Continue your Incruse and Symbicort.  Take albuterol 2 puffs up to every 4 hours if needed for shortness of breath.  Continue your oxygen at 3L/min.  Use hydromet as needed for cough Follow with Dr Lamonte Sakai in 2 months or sooner if you have any problems.

## 2016-07-05 NOTE — Progress Notes (Signed)
Subjective:    Patient ID: [name], female    DOB: 07-17-1933, 82 y.o.   MRN: [id number]  HPI  82 yo Severe COPD, HTN + diastolic dysfxn, hypoxemia  Acute OV 02/13/16 -- acute visit, history of COPD, allergic rhinitis. Was well until about 1 weeks ago - spent some time outside, exposed to pollen. significant dyspnea, wheeze. Cough without sputum.   Has been taking her medications reliably and correctly.      ROV 07/05/16 -- This follow-up visit for patient with a history of severe COPD, hypertension and diastolic dysfunction, hypoxemic respiratory failure. She has experienced 2 exacerbations since her last visit in May 1 of which resulted in a hospitalization at the beginning of this month.  She went home 9 days ago. She remains on Incruse + symbicort. She is being managed at [facility] for bladder cancer - she has had bladder resection x 2, likely not a candidate for bladder removal. They are discussing possible chemo + XRT. They have questions today regarding overall prognosis, factoring in her resp disease.   She has had 3 flares this year. She is better in some regards, less hypoxemia. She is having nasal congestion, wet cough, continues to clear some mucous but not always. She is on zyrtec, uses flonase prn. Uses ventolin q6h                                                                                                                                                                                                            CAT Score 03/04/2013 09/17/2012 08/03/2012  Total CAT Score 16 9 12      Review of Systems As per HPI      Objective:   Physical Exam  Vitals:   07/05/16 1330  BP: 112/62  Pulse: 66  SpO2: 93%    GEN: A/Ox3; pleasant , NAD, elderly   NECK:  No LAd or stridor  RESP  Decreased BS, distant with B rhonchi  CARD:  RRR, no m/r/g  , no peripheral edema, pulses intact, no cyanosis or clubbing.  Musco: Warm bil, no deformities or joint swelling noted.    Neuro: alert, no focal deficits noted.    Skin: Warm, no lesions or rashes      Assessment & Plan:  COPD (chronic obstructive pulmonary disease) We did broach the severity of her disease, CODE STATUS. I have recommended that they think about this. I believe that she would tolerate mechanical ventilation poorly especially in the setting of an acute exacerbation. I will recommend that she  be DO NOT RESUSCITATE DO NOT INTUBATE. Like to think about this. There would also like for me to discuss her current status with urology at [facility] give our input regarding her suitability for the therapy and they have planned. I'll be glad to do this, they will get me the information.  Please start taking your flonase 2 sprays each side daily. You could consider increasing to twice a day if it is helping.  Continue zyrtec Add mucinex 600mg  twice a day for 2 weeks.  Continue your Incruse and Symbicort.  Take albuterol 2 puffs up to every 4 hours if needed for shortness of breath.  Continue your oxygen at 3L/min.  Use hydromet as needed for cough Follow with Dr Lamonte Sakai in 2 months or sooner if you have any problems.  Baltazar Apo, MD, PhD 07/05/2016, 2:04 PM [facility] [phone] or if no answer [phone]

## 2016-07-05 NOTE — Telephone Encounter (Signed)
Will forward to elise to follow up on Monday, since she had spoke to the pt about the oxygen tank.

## 2016-07-05 NOTE — Telephone Encounter (Signed)
I failed to mention in original note that she was returning Elise's call who was checking to see if the O2 tank was returned.  The patient took this with her to the hospital and her niece was to have returned this back to [place].

## 2016-07-05 NOTE — Patient Instructions (Addendum)
Please start taking your flonase 2 sprays each side daily. You could consider increasing to twice a day if it is helping.  Continue zyrtec Add mucinex 600mg  twice a day for 2 weeks.  Continue your Incruse and Symbicort.  Take albuterol 2 puffs up to every 4 hours if needed for shortness of breath.  Continue your oxygen at 3L/min.  Use hydromet as needed for cough Follow with Dr Lamonte Sakai in 2 months or sooner if you have any problems.

## 2016-07-09 NOTE — Telephone Encounter (Signed)
lmtcb for Melissa.  

## 2016-07-12 NOTE — Telephone Encounter (Signed)
lmtcb for [name].  

## 2016-07-16 NOTE — Telephone Encounter (Signed)
lmtcb for Melissa.  

## 2016-07-18 NOTE — Telephone Encounter (Signed)
lmtcb for [name].  

## 2016-07-19 ENCOUNTER — Ambulatory Visit (HOSPITAL_BASED_OUTPATIENT_CLINIC_OR_DEPARTMENT_OTHER): Payer: Medicare Other

## 2016-07-19 ENCOUNTER — Telehealth: Payer: Self-pay | Admitting: *Deleted

## 2016-07-19 VITALS — BP 122/56 | HR 73 | Temp 98.1°F | Resp 18

## 2016-07-19 DIAGNOSIS — D509 Iron deficiency anemia, unspecified: Secondary | ICD-10-CM

## 2016-07-19 DIAGNOSIS — K5521 Angiodysplasia of colon with hemorrhage: Secondary | ICD-10-CM

## 2016-07-19 MED ORDER — SODIUM CHLORIDE 0.9 % IV SOLN
510.0000 mg | INTRAVENOUS | Status: DC
  Administered 2016-07-19: 510 mg via INTRAVENOUS
  Filled 2016-07-19: qty 17

## 2016-07-19 NOTE — Telephone Encounter (Signed)
Dr. Burr Medico reviewed faxed lab results from [facility].   Called niece [name] and gave her appt for pt to receive IV Feraheme today at 1130 am as per Dr. Burr Medico and Ubaldo Glassing, RN nurse supervisor.

## 2016-07-19 NOTE — Patient Instructions (Signed)

## 2016-07-22 NOTE — Telephone Encounter (Signed)
Tank was brought back that Friday the pt was seen. Nothing further needed at this time.

## 2016-07-23 ENCOUNTER — Telehealth: Payer: Self-pay | Admitting: Hematology

## 2016-07-23 NOTE — Telephone Encounter (Signed)
confirmed 11/10 appt date/time w/ pt per LOS

## 2016-07-26 ENCOUNTER — Ambulatory Visit (HOSPITAL_BASED_OUTPATIENT_CLINIC_OR_DEPARTMENT_OTHER): Payer: Medicare Other

## 2016-07-26 VITALS — BP 134/54 | HR 82 | Temp 98.8°F | Resp 17

## 2016-07-26 DIAGNOSIS — D509 Iron deficiency anemia, unspecified: Secondary | ICD-10-CM

## 2016-07-26 DIAGNOSIS — K5521 Angiodysplasia of colon with hemorrhage: Secondary | ICD-10-CM

## 2016-07-26 MED ORDER — FERUMOXYTOL INJECTION 510 MG/17 ML
510.0000 mg | INTRAVENOUS | Status: DC
  Administered 2016-07-26: 510 mg via INTRAVENOUS
  Filled 2016-07-26: qty 17

## 2016-07-26 MED ORDER — SODIUM CHLORIDE 0.9 % IV SOLN
Freq: Once | INTRAVENOUS | Status: AC
  Administered 2016-07-26: 16:00:00 via INTRAVENOUS

## 2016-07-26 NOTE — Patient Instructions (Signed)

## 2016-08-14 ENCOUNTER — Telehealth: Payer: Self-pay | Admitting: Emergency Medicine

## 2016-08-14 NOTE — Telephone Encounter (Signed)
lmtcb X1 for [name] If pt needs to be seen sooner, her appt can be moved to earlier appt- openings as early as this afternoon.

## 2016-08-15 MED ORDER — PREDNISONE 10 MG PO TABS: ORAL_TABLET | ORAL | 0 refills | Status: DC

## 2016-08-15 NOTE — Telephone Encounter (Signed)
Please have her start prednisone taper: Take 40mg  daily for 3 days, then 30mg  daily for 3 days, then 20mg  daily for 3 days, then 10mg  daily for 3 days, then stop Have her keep the appt tomorrow.

## 2016-08-15 NOTE — Telephone Encounter (Signed)
Called and spoke with [facility] and she stated that they would not be able to come in this morning since the pt has been doing the radiation for her bladder cancer.  They are going to keep the appt for tomorrow with RB.  Pts niece stated that the pt fell about 3 weeks ago and they are not sure if this is from the radiation, but she has been having more fatigue since the radiation tx started.   For 4 days, the pt has been having cough that is not really productive but sounds very wet.  She cannot talk with out coughing.  She has had increase SOB and the need for more oxygen.  They wanted to see if RB wanted to call something in to her pharmacy to start on or should they just wait?  RB please advise. Thanks  Allergies  Allergen Reactions  . Latex Hives and Itching

## 2016-08-15 NOTE — Telephone Encounter (Signed)
Spoke with pt's daughter, [name]. She is aware of RB's recommendation. Rx has been sent in. Nothing further was needed.

## 2016-08-16 ENCOUNTER — Ambulatory Visit (INDEPENDENT_AMBULATORY_CARE_PROVIDER_SITE_OTHER): Payer: Medicare Other | Admitting: Emergency Medicine

## 2016-08-16 ENCOUNTER — Encounter: Payer: Self-pay | Admitting: Emergency Medicine

## 2016-08-16 DIAGNOSIS — J441 Chronic obstructive pulmonary disease with (acute) exacerbation: Secondary | ICD-10-CM

## 2016-08-16 MED ORDER — HYDROCODONE-HOMATROPINE 5-1.5 MG/5ML PO SYRP: 5.0000 mL | ORAL_SOLUTION | Freq: Two times a day (BID) | ORAL | 0 refills | Status: DC | PRN

## 2016-08-16 NOTE — Patient Instructions (Addendum)
Please continue and complete your prednisone taper as ordered.  Please continue or Incruse and Symbicort as you are taking them Take albuterol (Ventolin) 2 puffs up to every 4 hours if needed for shortness of breath.   Wear your oxygen 3L/min.  Follow with Dr Lamonte Sakai or APP in 2 -3 weeks to assess improvement.

## 2016-08-16 NOTE — Assessment & Plan Note (Addendum)
Significant wheezing on exam today. Suspect her recent symptoms are multifactorial with contribution of her radiation therapy by do believe she has an acute exacerbation. We will finish her prednisone taper as planned. Defer flu shot until the prednisone is finished. She will stay on her current medications and we will refill her Hycodan prescription.  Please continue and complete your prednisone taper as ordered.  Please continue or Incruse and Symbicort as you are taking them Take albuterol (Ventolin) 2 puffs up to every 4 hours if needed for shortness of breath.   Wear your oxygen 3L/min.  Follow with Dr Lamonte Sakai or APP in 2 -3 weeks to assess improvement.

## 2016-08-16 NOTE — Progress Notes (Signed)
Subjective:    Patient ID: [name], female    DOB: 1933/02/11, 82 y.o.   MRN: [id number]  HPI  82 yo Severe COPD, HTN + diastolic dysfxn, hypoxemia  Acute OV 02/13/16 -- acute visit, history of COPD, allergic rhinitis. Was well until about 1 weeks ago - spent some time outside, exposed to pollen. significant dyspnea, wheeze. Cough without sputum.   Has been taking her medications reliably and correctly.      ROV 07/05/16 -- This follow-up visit for patient with a history of severe COPD, hypertension and diastolic dysfunction, hypoxemic respiratory failure. She has experienced 2 exacerbations since her last visit in May 1 of which resulted in a hospitalization at the beginning of this month.  She went home 9 days ago. She remains on Incruse + symbicort. She is being managed at [facility] for bladder cancer - she has had bladder resection x 2, likely not a candidate for bladder removal. They are discussing possible chemo + XRT. They have questions today regarding overall prognosis, factoring in her resp disease.   She has had 3 flares this year. She is better in some regards, less hypoxemia. She is having nasal congestion, wet cough, continues to clear some mucous but not always. She is on zyrtec, uses flonase prn. Uses ventolin q6h  ROV 08/16/16 -- This is a follow-up visit for patient with a history of severe COPD, diastolic CHF and hypertension, hypoxemic respiratory failure. She also has a fairly new diagnosis of bladder cancer for which she has undergone surgical resection and is receiving radiation therapy. I have spoken with urology at [facility] to review her treatment. We both agree that she is not candidate for bladder resection. At her last visit we added fluticasone nasal spray, added Mucinex, continued Incruse and Symbicort. She has been having more fatigue since beginning XRT, continues to have cough. We spoke yesterday and she was having increased dyspnea and some desaturations. This prompted me  to start prednisone taper. She is using ventolin more freq than her baseline. No fevers. Cough is non-productive.                                                                                                                                                                                                             CAT Score 03/04/2013 09/17/2012 08/03/2012  Total CAT Score 16 9 12      Review of Systems As per HPI      Objective:   Physical Exam  Vitals:   08/16/16 1353  BP: 124/78  Pulse: (!) 104  SpO2: 92%  Weight: 148 lb (67.1 kg)  Height: 5' 5.5" (1.664 m)    GEN: A/Ox3; pleasant , NAD, elderly   NECK:  No LAd or stridor  RESP  Decreased BS, distant with B rhonchi  CARD:  RRR, no m/r/g  , no peripheral edema, pulses intact, no cyanosis or clubbing.  Musco: Warm bil, no deformities or joint swelling noted.   Neuro: alert, no focal deficits noted.    Skin: Warm, no lesions or rashes      Assessment & Plan:  COPD (chronic obstructive pulmonary disease) Significant wheezing on exam today. Suspect her recent symptoms are multifactorial with contribution of her radiation therapy by do believe she has an acute exacerbation. We will finish her prednisone taper as planned. Defer flu shot until the prednisone is finished. She will stay on her current medications and we will refill her Hycodan prescription.  Please continue and complete your prednisone taper as ordered.  Please continue or Incruse and Symbicort as you are taking them Take albuterol (Ventolin) 2 puffs up to every 4 hours if needed for shortness of breath.   Wear your oxygen 3L/min.  Follow with Dr Lamonte Sakai or APP in 2 -3 weeks to assess improvement.   Baltazar Apo, MD, PhD 08/16/2016, 2:10 PM [facility] [phone] or if no answer [phone]

## 2016-08-28 ENCOUNTER — Ambulatory Visit (INDEPENDENT_AMBULATORY_CARE_PROVIDER_SITE_OTHER): Payer: Medicare Other | Admitting: Acute Care

## 2016-08-28 ENCOUNTER — Encounter: Payer: Self-pay | Admitting: Acute Care

## 2016-08-28 ENCOUNTER — Other Ambulatory Visit (INDEPENDENT_AMBULATORY_CARE_PROVIDER_SITE_OTHER): Payer: Medicare Other

## 2016-08-28 ENCOUNTER — Ambulatory Visit: Payer: Medicare Other | Admitting: Acute Care

## 2016-08-28 VITALS — BP 128/60 | HR 119 | Ht 65.5 in | Wt 165.0 lb

## 2016-08-28 DIAGNOSIS — J441 Chronic obstructive pulmonary disease with (acute) exacerbation: Secondary | ICD-10-CM | POA: Diagnosis not present

## 2016-08-28 DIAGNOSIS — Z23 Encounter for immunization: Secondary | ICD-10-CM | POA: Diagnosis not present

## 2016-08-28 DIAGNOSIS — T148XXA Other injury of unspecified body region, initial encounter: Secondary | ICD-10-CM | POA: Diagnosis not present

## 2016-08-28 LAB — CBC
HEMATOCRIT: 37.9 % (ref 36.0–46.0)
HEMOGLOBIN: 12.2 g/dL (ref 12.0–15.0)
MCHC: 32.3 g/dL (ref 30.0–36.0)
MCV: 96.9 fl (ref 78.0–100.0)
PLATELETS: 253 10*3/uL (ref 150.0–400.0)
RBC: 3.91 Mil/uL (ref 3.87–5.11)
RDW: 15.8 % — ABNORMAL HIGH (ref 11.5–15.5)
WBC: 10.4 10*3/uL (ref 4.0–10.5)

## 2016-08-28 NOTE — Patient Instructions (Addendum)
It is nice to see you today. We will check some lab work today to evaluate you for bleeding.( CBC today) We will call Melissa  with the lab results.  Please continue or Incruse and Symbicort as you are taking them Take albuterol (Ventolin) 2 puffs up to every 4 hours if needed for shortness of breath.   Wear your oxygen 3L/min.  Flu shot today. Follow up appointment 09/03/16 with Dr. Lamonte Sakai. If labs today are ok, we can cancel appointment and re-schedule in 2 months. Please contact office for sooner follow up if symptoms do not improve or worsen or seek emergency care

## 2016-08-28 NOTE — Assessment & Plan Note (Signed)
Resolved Exacerbation after treatment with prednisone taper and Levaquin Plan: Please continue or Incruse and Symbicort as you are taking them Take albuterol (Ventolin) 2 puffs up to every 4 hours if needed for shortness of breath.   Wear your oxygen 3L/min.  Flu shot today. Follow up appointment 09/03/16 with Dr. Lamonte Sakai. If labs today are ok, we can cancel appointment and re-schedule in 2 months. Please contact office for sooner follow up if symptoms do not improve or worsen or seek emergency care

## 2016-08-28 NOTE — Progress Notes (Signed)
History of Present Illness [name] is a 82 y.o. female with  Severe COPD, HTN + diastolic dysfxn, hypoxemia, bladder cancer ( Radiation treatment 12/17) She is followed by Dr. Lamonte Sakai.   08/28/2016 Follow Up appointment: Pt. Presents today for follow up of COPD exacerbation.Marland Kitchen She was treated with a prednisone taper and Levaquin on 08/16/2016 for a COPD exacerbation.She states she is feeling much better. She does not have a cough, she is not producing any sputum. Shortness of breath and wheezing have returned to her baseline.She wears oxygen at 3 L [place].She was not wearing it in the office today, and she desaturated to 81%. She states that it takes 55 minutes to drive to the office from home, and in order to make it home and that she does not bring the oxygen tank in from her car. I have told her that in the future as soon as she gets to the lobby to request an oxygen tank because we do not want her sitting in the lobby without oxygen in the future. She denies fever, chest pain, orthopnea or hemoptysis. She is currently undergoing radiation therapy for bladder cancer. As she is in and out of the [facility] for her radiation treatments, we will give her her flu vaccine today. She is compliant with her Symbicort 2 puffs twice daily and her Incruse 1 puff at bedtime.. She continues to use her Zyrtec, and her Tessalon Perles. She is compliant with her Flonase, and her Delsym for cough. She is compliant with her Lasix. She is compliant with her nebulizer treatments twice daily. She states she uses her Ventolin rescue inhaler 3-4 times daily.  She is having some bruising to legs and abdomen which she states is new. She is not taking any medications that should cause  Bruising.She did just complete a prednisone taper.I asked her specifically of she is bumping into things more at present, and she stated that that may be the case. CBC today in the office confirmed platelet count of 253,000, and HGB of  12.2.  Tests Results for [name], [name] (MRN [id number]) as of 08/28/2016 20:21  Ref. Range 08/28/2016 17:12  WBC Latest Ref Range: 4.0 - 10.5 K/uL 10.4  RBC Latest Ref Range: 3.87 - 5.11 Mil/uL 3.91  Hemoglobin Latest Ref Range: 12.0 - 15.0 g/dL 12.2  HCT Latest Ref Range: 36.0 - 46.0 % 37.9  MCV Latest Ref Range: 78.0 - 100.0 fl 96.9  MCHC Latest Ref Range: 30.0 - 36.0 g/dL 32.3  RDW Latest Ref Range: 11.5 - 15.5 % 15.8 (H)  Platelets Latest Ref Range: 150.0 - 400.0 K/uL 253.0    Past medical hx Past Medical History:  Diagnosis Date  . Allergic rhinitis   . Anemia 04/23/2012   "severe @ times; think caused by colonic AVMs"  . Anginal pain ([place])    "pressure"  . Arthritis    "probably"  . Asthma   . Breast cancer ([place])   . COPD (chronic obstructive pulmonary disease) ([place])   . Coronary atherosclerosis of native coronary artery    Cath 1/11: RCA 50-60 ostial o/w normal. EF 70%.   . Dyspnea 04/23/2012   "all the time"  . Edema   . Emphysema   . Emphysema   . Gastric AVM   . H/O hiatal hernia   . Heart murmur   . History of blood transfusion    "many times"  . Hypertension   . Hypertr obst cardiomyop    Echo 10/10 EF  65-70% SW 1.6 PW 1.4 mild SAM no LVOT gradient at rest. Valsalva not performed Mild MR. No hyperenhance ment on MRI EF 69%.  . Hypothyroidism   . Occlusion and stenosis of carotid artery    s/p L CEA u/s 3/11. R 60-79%; L 40-59%. no change since 3/10  . Oxygen dependent 04/23/2012   "concentrated at night; liquid during day"  . Peripheral vascular disease, unspecified   . Pneumonia   . Pulmonary hypertension    Cath 1/11 RA 8, RV 43/6/14, PA 42/17 (29) PCWP 15 Fick  5.0/2.6. PVR 2.8    . Stroke [facility]) ~ 2004   denies residual on 04/23/2012     Past surgical hx, Family hx, Social hx all reviewed.  Current Outpatient Prescriptions on File Prior to Visit  Medication Sig  . albuterol (PROVENTIL HFA;VENTOLIN HFA) 108 (90 Base) MCG/ACT inhaler Inhale 2 puffs  into the lungs every 6 (six) hours as needed for wheezing or shortness of breath.  Marland Kitchen atorvastatin (LIPITOR) 20 MG tablet Take 20 mg by mouth daily.    . benzonatate (TESSALON) 200 MG capsule Take 1 capsule (200 mg total) by mouth 3 (three) times daily as needed for cough.  . budesonide-formoterol (SYMBICORT) 160-4.5 MCG/ACT inhaler Inhale 2 puffs into the lungs 2 (two) times daily.  . cetirizine (ZYRTEC) 10 MG tablet Take 10 mg by mouth daily.  . cholecalciferol (VITAMIN D) 1000 units tablet Take 1,000 Units by mouth daily.  Marland Kitchen dextromethorphan (DELSYM) 30 MG/5ML liquid Take 30 mg by mouth at bedtime as needed for cough.   . escitalopram (LEXAPRO) 20 MG tablet Take 20 mg by mouth daily.   . fluticasone (FLONASE) 50 MCG/ACT nasal spray Place 2 sprays into both nostrils daily.  . furosemide (LASIX) 40 MG tablet Take 1 tablet (40 mg total) by mouth daily as needed for fluid or edema (Increased lower extremity swelling or weight gain of 3lbs in 24 hours / 5 lbs in one week).  Marland Kitchen HYDROcodone-homatropine (HYDROMET) 5-1.5 MG/5ML syrup Take 5 mLs by mouth every 12 (twelve) hours as needed.  Marland Kitchen levothyroxine (SYNTHROID, LEVOTHROID) 150 MCG tablet Take 150 mcg by mouth daily.   . metoprolol tartrate (LOPRESSOR) 25 MG tablet Take 25 mg by mouth 2 (two) times daily.  . Multiple Vitamin (MULTIVITAMIN WITH MINERALS) TABS tablet Take 1 tablet by mouth daily.  . polyethylene glycol (MIRALAX / GLYCOLAX) packet Take 17 g by mouth daily as needed for mild constipation.  Marland Kitchen tolterodine (DETROL LA) 2 MG 24 hr capsule Take 2 mg by mouth 2 (two) times daily.   . traZODone (DESYREL) 100 MG tablet Take 100 mg by mouth at bedtime.   Marland Kitchen umeclidinium bromide (INCRUSE ELLIPTA) 62.5 MCG/INH AEPB Inhale 1 puff into the lungs at bedtime.  . VOLTAREN 1 % GEL Apply 2 g topically 4 (four) times daily as needed. Apply to hand for pain  . predniSONE (DELTASONE) 10 MG tablet Take 4 tablets for 3 days, 3 tablets for 3 days, 2 tablets for 3  days, 1 tablet for 3 days (Patient not taking: Reported on 08/28/2016)   No current facility-administered medications on file prior to visit.      Allergies  Allergen Reactions  . Latex Hives and Itching    Review Of Systems:  Constitutional:   No  weight loss, night sweats,  Fevers, chills, +fatigue, or  lassitude.  HEENT:   No headaches,  Difficulty swallowing,  Tooth/dental problems, or  Sore throat,  No sneezing, itching, ear ache, nasal congestion, post nasal drip,   CV:  No chest pain,  Orthopnea, PND, swelling in lower extremities, anasarca, dizziness, palpitations, syncope.   GI  No heartburn, indigestion, abdominal pain, nausea, vomiting, diarrhea, change in bowel habits, loss of appetite, bloody stools.   Resp: + shortness of breath with exertion not at rest.  No excess mucus, no productive cough,  No non-productive cough,  No coughing up of blood.  No change in color of mucus.  + wheezing.  No chest wall deformity  Skin: no rash or lesions.+ bruising to legs and abdomen  GU: no dysuria, change in color of urine, no urgency or frequency.  No flank pain, no hematuria   MS:  No joint pain or swelling.  No decreased range of motion.  No back pain.  Psych:  No change in mood or affect. No depression or anxiety.  No memory loss.   Vital Signs BP 128/60   Pulse (!) 119   Ht 5' 5.5" (1.664 m)   Wt 165 lb (74.8 kg)   SpO2 94%   BMI 27.04 kg/m    Physical Exam:  General- No distress,  A&Ox3, elderly female in a wheel cahair ENT: No sinus tenderness, TM clear, pale nasal mucosa, no oral exudate,no post nasal drip, no LAN Cardiac: S1, S2, regular rate and rhythm, no murmur Chest: + Exp  Wheeze/no rales/ dullness; no accessory muscle use, no nasal flaring, no sternal retractions Abd.: Soft Non-tender Ext: No clubbing cyanosis, edema Neuro:  normal strength Skin: No rashes, warm and dry Psych: normal mood and behavior   Assessment/Plan  COPD with  acute exacerbation (HCC) Resolved Exacerbation after treatment with prednisone taper and Levaquin Plan: Please continue or Incruse and Symbicort as you are taking them Take albuterol (Ventolin) 2 puffs up to every 4 hours if needed for shortness of breath.   Wear your oxygen 3L/min.  Flu shot today. Follow up appointment 09/03/16 with Dr. Lamonte Sakai. If labs today are ok, we can cancel appointment and re-schedule in 2 months. Please contact office for sooner follow up if symptoms do not improve or worsen or seek emergency care     Bruising Bruising to abdomen and legs Plan: CBC today to check platelets ( 253,000) Monitor for additional bleeding/bruising Pt. Advised to Seek emergency care for bleeding Please contact office for sooner follow up if symptoms do not improve or worsen or seek emergency care    Health Care Maintenence: Flu vaccination today  Magdalen Spatz, NP 08/28/2016  8:29 PM

## 2016-08-28 NOTE — Assessment & Plan Note (Signed)
Bruising to abdomen and legs Plan: CBC today to check platelets ( 253,000) Monitor for additional bleeding/bruising Pt. Advised to Seek emergency care for bleeding Please contact office for sooner follow up if symptoms do not improve or worsen or seek emergency care

## 2016-08-29 ENCOUNTER — Telehealth: Payer: Self-pay | Admitting: Emergency Medicine

## 2016-08-29 NOTE — Telephone Encounter (Signed)
Magdalen Spatz, NP sent to Randa Spike, CMA        Please call Ms. [name] and let her know her platelet count was normal. We discussed cancelling her 12/19 appointment with [place] if it was normal and making her a new appointment for 2 months. Please make sure that is still what she wants to do. Remind her to follow up with [place] if her bruising does not improve.Thanks so much.    Spoke with the pt and notified of recs per SG  She verbalized understanding  She wants to see RB in 2 months and appt was scheduled  Nothing further needed

## 2016-08-29 NOTE — Progress Notes (Signed)
Pt aware.

## 2016-09-03 ENCOUNTER — Ambulatory Visit: Payer: Medicare Other | Admitting: Emergency Medicine

## 2016-09-03 ENCOUNTER — Ambulatory Visit: Payer: Medicare Other | Admitting: Adult Health

## 2016-09-17 ENCOUNTER — Other Ambulatory Visit: Payer: Self-pay | Admitting: Pulmonary Disease

## 2016-10-09 ENCOUNTER — Other Ambulatory Visit: Payer: Self-pay | Admitting: Emergency Medicine

## 2016-10-17 ENCOUNTER — Encounter (INDEPENDENT_AMBULATORY_CARE_PROVIDER_SITE_OTHER): Payer: Self-pay

## 2016-10-17 ENCOUNTER — Ambulatory Visit (INDEPENDENT_AMBULATORY_CARE_PROVIDER_SITE_OTHER): Payer: Medicare Other | Admitting: Emergency Medicine

## 2016-10-17 ENCOUNTER — Inpatient Hospital Stay (HOSPITAL_COMMUNITY)
Admission: AD | Admit: 2016-10-17 | Discharge: 2016-10-25 | DRG: 190 | Disposition: A | Discharge status: Home / Self Care | Payer: Medicare Other | Source: Ambulatory Visit | Attending: Emergency Medicine | Admitting: Emergency Medicine

## 2016-10-17 ENCOUNTER — Encounter: Payer: Self-pay | Admitting: Emergency Medicine

## 2016-10-17 ENCOUNTER — Inpatient Hospital Stay (HOSPITAL_COMMUNITY): Payer: Medicare Other

## 2016-10-17 DIAGNOSIS — I5032 Chronic diastolic (congestive) heart failure: Secondary | ICD-10-CM | POA: Diagnosis present

## 2016-10-17 DIAGNOSIS — F329 Major depressive disorder, single episode, unspecified: Secondary | ICD-10-CM | POA: Diagnosis present

## 2016-10-17 DIAGNOSIS — B370 Candidal stomatitis: Secondary | ICD-10-CM | POA: Diagnosis present

## 2016-10-17 DIAGNOSIS — Z87891 Personal history of nicotine dependence: Secondary | ICD-10-CM | POA: Diagnosis not present

## 2016-10-17 DIAGNOSIS — Z823 Family history of stroke: Secondary | ICD-10-CM | POA: Diagnosis not present

## 2016-10-17 DIAGNOSIS — R059 Cough, unspecified: Secondary | ICD-10-CM

## 2016-10-17 DIAGNOSIS — I110 Hypertensive heart disease with heart failure: Secondary | ICD-10-CM | POA: Diagnosis present

## 2016-10-17 DIAGNOSIS — J441 Chronic obstructive pulmonary disease with (acute) exacerbation: Secondary | ICD-10-CM

## 2016-10-17 DIAGNOSIS — J309 Allergic rhinitis, unspecified: Secondary | ICD-10-CM | POA: Diagnosis present

## 2016-10-17 DIAGNOSIS — Z66 Do not resuscitate: Secondary | ICD-10-CM | POA: Diagnosis present

## 2016-10-17 DIAGNOSIS — Z853 Personal history of malignant neoplasm of breast: Secondary | ICD-10-CM

## 2016-10-17 DIAGNOSIS — R0602 Shortness of breath: Secondary | ICD-10-CM | POA: Diagnosis present

## 2016-10-17 DIAGNOSIS — J9621 Acute and chronic respiratory failure with hypoxia: Secondary | ICD-10-CM | POA: Diagnosis present

## 2016-10-17 DIAGNOSIS — Z8673 Personal history of transient ischemic attack (TIA), and cerebral infarction without residual deficits: Secondary | ICD-10-CM

## 2016-10-17 DIAGNOSIS — E785 Hyperlipidemia, unspecified: Secondary | ICD-10-CM | POA: Diagnosis present

## 2016-10-17 DIAGNOSIS — Q2739 Arteriovenous malformation, other site: Secondary | ICD-10-CM | POA: Diagnosis not present

## 2016-10-17 DIAGNOSIS — I2510 Atherosclerotic heart disease of native coronary artery without angina pectoris: Secondary | ICD-10-CM | POA: Diagnosis present

## 2016-10-17 DIAGNOSIS — R05 Cough: Secondary | ICD-10-CM | POA: Diagnosis not present

## 2016-10-17 DIAGNOSIS — Z923 Personal history of irradiation: Secondary | ICD-10-CM

## 2016-10-17 DIAGNOSIS — D509 Iron deficiency anemia, unspecified: Secondary | ICD-10-CM | POA: Diagnosis not present

## 2016-10-17 DIAGNOSIS — Z825 Family history of asthma and other chronic lower respiratory diseases: Secondary | ICD-10-CM | POA: Diagnosis not present

## 2016-10-17 DIAGNOSIS — E039 Hypothyroidism, unspecified: Secondary | ICD-10-CM | POA: Diagnosis present

## 2016-10-17 DIAGNOSIS — K5900 Constipation, unspecified: Secondary | ICD-10-CM | POA: Diagnosis present

## 2016-10-17 DIAGNOSIS — I10 Essential (primary) hypertension: Secondary | ICD-10-CM | POA: Diagnosis not present

## 2016-10-17 DIAGNOSIS — Z9981 Dependence on supplemental oxygen: Secondary | ICD-10-CM | POA: Diagnosis not present

## 2016-10-17 DIAGNOSIS — J9801 Acute bronchospasm: Secondary | ICD-10-CM | POA: Diagnosis present

## 2016-10-17 DIAGNOSIS — I509 Heart failure, unspecified: Secondary | ICD-10-CM | POA: Diagnosis not present

## 2016-10-17 DIAGNOSIS — Z79899 Other long term (current) drug therapy: Secondary | ICD-10-CM

## 2016-10-17 DIAGNOSIS — G4700 Insomnia, unspecified: Secondary | ICD-10-CM | POA: Diagnosis present

## 2016-10-17 DIAGNOSIS — Z8551 Personal history of malignant neoplasm of bladder: Secondary | ICD-10-CM

## 2016-10-17 DIAGNOSIS — Z8249 Family history of ischemic heart disease and other diseases of the circulatory system: Secondary | ICD-10-CM | POA: Diagnosis not present

## 2016-10-17 DIAGNOSIS — R0600 Dyspnea, unspecified: Secondary | ICD-10-CM

## 2016-10-17 DIAGNOSIS — K219 Gastro-esophageal reflux disease without esophagitis: Secondary | ICD-10-CM | POA: Diagnosis present

## 2016-10-17 LAB — COMPREHENSIVE METABOLIC PANEL
ALBUMIN: 4 g/dL (ref 3.5–5.0)
ALK PHOS: 86 U/L (ref 38–126)
ALT: 11 U/L — AB (ref 14–54)
AST: 16 U/L (ref 15–41)
Anion gap: 8 (ref 5–15)
BUN: 24 mg/dL — AB (ref 6–20)
CALCIUM: 9.3 mg/dL (ref 8.9–10.3)
CO2: 37 mmol/L — AB (ref 22–32)
CREATININE: 0.78 mg/dL (ref 0.44–1.00)
Chloride: 96 mmol/L — ABNORMAL LOW (ref 101–111)
GFR calc Af Amer: >60 mL/min (ref >60)
GFR calc non Af Amer: >60 mL/min (ref >60)
GLUCOSE: 148 mg/dL — AB (ref 65–99)
Potassium: 4.5 mmol/L (ref 3.5–5.1)
SODIUM: 141 mmol/L (ref 135–145)
Total Bilirubin: 0.4 mg/dL (ref 0.3–1.2)
Total Protein: 7.4 g/dL (ref 6.5–8.1)

## 2016-10-17 LAB — MAGNESIUM: Magnesium: 2.7 mg/dL — ABNORMAL HIGH (ref 1.7–2.4)

## 2016-10-17 LAB — CBC
HEMATOCRIT: 41.6 % (ref 36.0–46.0)
Hemoglobin: 12.4 g/dL (ref 12.0–15.0)
MCH: 29.5 pg (ref 26.0–34.0)
MCHC: 29.8 g/dL — AB (ref 30.0–36.0)
MCV: 98.8 fL (ref 78.0–100.0)
Platelets: 291 10*3/uL (ref 150–400)
RBC: 4.21 MIL/uL (ref 3.87–5.11)
RDW: 13.5 % (ref 11.5–15.5)
WBC: 8.8 10*3/uL (ref 4.0–10.5)

## 2016-10-17 LAB — INFLUENZA PANEL BY PCR (TYPE A & B)
Influenza A By PCR: NEGATIVE
Influenza B By PCR: NEGATIVE

## 2016-10-17 LAB — PHOSPHORUS: Phosphorus: 5.7 mg/dL — ABNORMAL HIGH (ref 2.5–4.6)

## 2016-10-17 LAB — TSH: TSH: 1.422 u[IU]/mL (ref 0.350–4.500)

## 2016-10-17 MED ORDER — ADULT MULTIVITAMIN W/MINERALS CH
1.0000 | ORAL_TABLET | Freq: Every day | ORAL | Status: DC
  Administered 2016-10-18 – 2016-10-25 (×8): 1 via ORAL
  Filled 2016-10-17 (×8): qty 1

## 2016-10-17 MED ORDER — LEVOTHYROXINE SODIUM 50 MCG PO TABS
150.0000 ug | ORAL_TABLET | Freq: Every day | ORAL | Status: DC
  Administered 2016-10-18 – 2016-10-25 (×8): 150 ug via ORAL
  Filled 2016-10-17 (×8): qty 1

## 2016-10-17 MED ORDER — ESCITALOPRAM OXALATE 10 MG PO TABS
20.0000 mg | ORAL_TABLET | Freq: Every day | ORAL | Status: DC
  Administered 2016-10-18 – 2016-10-25 (×8): 20 mg via ORAL
  Filled 2016-10-17 (×9): qty 2

## 2016-10-17 MED ORDER — ATORVASTATIN CALCIUM 20 MG PO TABS
20.0000 mg | ORAL_TABLET | Freq: Every day | ORAL | Status: DC
  Administered 2016-10-17 – 2016-10-24 (×8): 20 mg via ORAL
  Filled 2016-10-17 (×8): qty 1

## 2016-10-17 MED ORDER — VITAMIN D3 25 MCG (1000 UNIT) PO TABS
1000.0000 [IU] | ORAL_TABLET | Freq: Every day | ORAL | Status: DC
  Administered 2016-10-18 – 2016-10-25 (×8): 1000 [IU] via ORAL
  Filled 2016-10-17 (×8): qty 1

## 2016-10-17 MED ORDER — FESOTERODINE FUMARATE ER 4 MG PO TB24
4.0000 mg | ORAL_TABLET | Freq: Every day | ORAL | Status: DC
  Administered 2016-10-18 – 2016-10-25 (×8): 4 mg via ORAL
  Filled 2016-10-17 (×8): qty 1

## 2016-10-17 MED ORDER — FLUTICASONE PROPIONATE 50 MCG/ACT NA SUSP
2.0000 | Freq: Every day | NASAL | Status: DC
  Administered 2016-10-17 – 2016-10-23 (×7): 2 via NASAL
  Filled 2016-10-17: qty 16

## 2016-10-17 MED ORDER — METHYLPREDNISOLONE SODIUM SUCC 125 MG IJ SOLR
60.0000 mg | Freq: Four times a day (QID) | INTRAMUSCULAR | Status: DC
  Administered 2016-10-17 – 2016-10-18 (×3): 60 mg via INTRAVENOUS
  Filled 2016-10-17 (×3): qty 2

## 2016-10-17 MED ORDER — IPRATROPIUM-ALBUTEROL 0.5-2.5 (3) MG/3ML IN SOLN
3.0000 mL | Freq: Four times a day (QID) | RESPIRATORY_TRACT | Status: DC
  Administered 2016-10-17 – 2016-10-25 (×31): 3 mL via RESPIRATORY_TRACT
  Filled 2016-10-17 (×31): qty 3

## 2016-10-17 MED ORDER — BUDESONIDE 0.25 MG/2ML IN SUSP
0.2500 mg | Freq: Two times a day (BID) | RESPIRATORY_TRACT | Status: DC
  Administered 2016-10-17 – 2016-10-21 (×8): 0.25 mg via RESPIRATORY_TRACT
  Filled 2016-10-17 (×8): qty 2

## 2016-10-17 MED ORDER — DEXTROSE 5 % IV SOLN
500.0000 mg | INTRAVENOUS | Status: DC
  Administered 2016-10-17 – 2016-10-18 (×2): 500 mg via INTRAVENOUS
  Filled 2016-10-17 (×3): qty 500

## 2016-10-17 MED ORDER — LORATADINE 10 MG PO TABS
10.0000 mg | ORAL_TABLET | Freq: Every day | ORAL | Status: DC
  Administered 2016-10-18 – 2016-10-22 (×5): 10 mg via ORAL
  Filled 2016-10-17 (×6): qty 1

## 2016-10-17 MED ORDER — BENZONATATE 100 MG PO CAPS
200.0000 mg | ORAL_CAPSULE | Freq: Three times a day (TID) | ORAL | Status: DC | PRN
  Administered 2016-10-17 – 2016-10-22 (×7): 200 mg via ORAL
  Filled 2016-10-17 (×8): qty 2

## 2016-10-17 MED ORDER — METOPROLOL TARTRATE 25 MG PO TABS
25.0000 mg | ORAL_TABLET | Freq: Two times a day (BID) | ORAL | Status: DC
  Administered 2016-10-17 – 2016-10-25 (×16): 25 mg via ORAL
  Filled 2016-10-17 (×16): qty 1

## 2016-10-17 MED ORDER — HYDROCODONE-HOMATROPINE 5-1.5 MG/5ML PO SYRP
5.0000 mL | ORAL_SOLUTION | Freq: Two times a day (BID) | ORAL | Status: DC | PRN
  Administered 2016-10-17 – 2016-10-23 (×6): 5 mL via ORAL
  Filled 2016-10-17 (×6): qty 5

## 2016-10-17 MED ORDER — DICLOFENAC SODIUM 1 % TD GEL: 2.0000 g | Freq: Four times a day (QID) | TRANSDERMAL | Status: DC | PRN

## 2016-10-17 MED ORDER — TRAZODONE HCL 50 MG PO TABS
100.0000 mg | ORAL_TABLET | Freq: Every day | ORAL | Status: DC
  Administered 2016-10-17 – 2016-10-24 (×8): 100 mg via ORAL
  Filled 2016-10-17 (×8): qty 2

## 2016-10-17 MED ORDER — POLYETHYLENE GLYCOL 3350 17 G PO PACK
17.0000 g | PACK | Freq: Every day | ORAL | Status: DC | PRN
  Administered 2016-10-18 – 2016-10-25 (×4): 17 g via ORAL
  Filled 2016-10-17 (×5): qty 1

## 2016-10-17 MED ORDER — CEFTRIAXONE SODIUM 1 G IJ SOLR
1.0000 g | INTRAMUSCULAR | Status: DC
  Administered 2016-10-17 – 2016-10-18 (×2): 1 g via INTRAVENOUS
  Filled 2016-10-17 (×3): qty 10

## 2016-10-17 NOTE — Assessment & Plan Note (Signed)
With an acute exacerbation that has not improved with outpt pred and levaquin. Will admit her to hospital for further care. Refer also to inpatient note

## 2016-10-17 NOTE — Progress Notes (Signed)
Subjective:    Patient ID: [name], female    DOB: 08/30/1933, 83 y.o.   MRN: [id number]  HPI  83 yo Severe COPD, HTN + diastolic dysfxn, hypoxemia  Acute OV 02/13/16 -- acute visit, history of COPD, allergic rhinitis. Was well until about 1 weeks ago - spent some time outside, exposed to pollen. significant dyspnea, wheeze. Cough without sputum.   Has been taking her medications reliably and correctly.      ROV 07/05/16 -- This follow-up visit for patient with a history of severe COPD, hypertension and diastolic dysfunction, hypoxemic respiratory failure. She has experienced 2 exacerbations since her last visit in May 1 of which resulted in a hospitalization at the beginning of this month.  She went home 9 days ago. She remains on Incruse + symbicort. She is being managed at [facility] for bladder cancer - she has had bladder resection x 2, likely not a candidate for bladder removal. They are discussing possible chemo + XRT. They have questions today regarding overall prognosis, factoring in her resp disease.   She has had 3 flares this year. She is better in some regards, less hypoxemia. She is having nasal congestion, wet cough, continues to clear some mucous but not always. She is on zyrtec, uses flonase prn. Uses ventolin q6h  ROV 08/16/16 -- This is a follow-up visit for patient with a history of severe COPD, diastolic CHF and hypertension, hypoxemic respiratory failure. She also has a fairly new diagnosis of bladder cancer for which she has undergone surgical resection and is receiving radiation therapy. I have spoken with urology at [facility] to review her treatment. We both agree that she is not candidate for bladder resection. At her last visit we added fluticasone nasal spray, added Mucinex, continued Incruse and Symbicort. She has been having more fatigue since beginning XRT, continues to have cough. We spoke yesterday and she was having increased dyspnea and some desaturations. This prompted me  to start prednisone taper. She is using ventolin more freq than her baseline. No fevers. Cough is non-productive.                                                                                     ROV 10/17/16 -- patient has a history of chronic hypoxemic respiratory failure in the setting of severe COPD, diastolic CHF and hypertension. She also has bladder cancer. currently managed on incruse, symbicort. She has been more SOB and had more cough beginning 10/03/16. Cough wet and occasionally productive. No fever. She was just treated with levaquin and pred taper that she is about to complete. Her O2 is on 3L/min. She has not rebounded.   She has just finished XRT in late December.  No flowsheet data found.   Review of Systems As per HPI  Past Medical History:  Diagnosis Date  . Allergic rhinitis   . Anemia 04/23/2012   "severe @ times; think caused by colonic AVMs"  . Anginal pain ([place])    "pressure"  . Arthritis    "probably"  . Asthma   . Breast cancer ([place])   . COPD (chronic obstructive pulmonary disease) ([place])   . Coronary atherosclerosis of native coronary artery    Cath 1/11: RCA 50-60 ostial o/w normal. EF 70%.   . Dyspnea 04/23/2012   "all the time"  . Edema   . Emphysema   . Emphysema   . Gastric AVM   . H/O hiatal hernia   . Heart murmur   . History of blood transfusion    "many times"  . Hypertension   . Hypertr obst cardiomyop    Echo 10/10 EF 65-70% SW 1.6 PW 1.4 mild SAM no LVOT gradient at rest. Valsalva not performed Mild MR. No hyperenhance ment on MRI EF 69%.  . Hypothyroidism   . Occlusion and stenosis of carotid artery    s/p L CEA u/s 3/11. R 60-79%; L 40-59%. no change since 3/10  . Oxygen dependent 04/23/2012   "concentrated at night; liquid during day"  . Peripheral vascular disease, unspecified   . Pneumonia   . Pulmonary hypertension    Cath 1/11 RA 8, RV 43/6/14, PA  42/17 (29) PCWP 15 Fick  5.0/2.6. PVR 2.8    . Stroke [facility]) ~ 2004   denies residual on 04/23/2012     Family History  Problem Relation Age of Onset  . Emphysema Father   . Colon cancer Mother   . Stroke Mother   . COPD Sister   . Hypertension Sister     Pulmonary  . Emphysema Sister   . Asthma Sister   . Asthma Sister   . Emphysema Brother     x2  . Colon cancer Brother      Social History   Social History  . Marital status: Single    Spouse name: N/A  . Number of children: N/A  . Years of education: N/A   Occupational History  . retired Retired    Physicist, medical for an apartment complex   Social History Main Topics  . Smoking status: Former Smoker    Packs/day: 1.00    Years: 30.00    Types: Cigarettes    Quit date: 09/16/1997  . Smokeless tobacco: Never Used  . Alcohol use No  . Drug use: No  . Sexual activity: Not Currently   Other Topics Concern  . Not on file   Social History Narrative   Single, no children   Lives with 2 sisters   Does not get regular exercise     Allergies  Allergen Reactions  . Latex Hives and Itching     Outpatient Medications Prior to Visit  Medication Sig Dispense Refill  . albuterol (PROVENTIL HFA;VENTOLIN HFA) 108 (90 Base) MCG/ACT inhaler Inhale 2 puffs into the lungs every 6 (six) hours as needed for wheezing or shortness of breath. 1 Inhaler 5  . atorvastatin (LIPITOR) 20 MG tablet Take 20 mg by mouth daily.      . benzonatate (TESSALON) 200 MG capsule Take 1 capsule (200 mg total) by mouth 3 (three) times daily as needed for cough. 30 capsule 1  . budesonide-formoterol (SYMBICORT) 160-4.5 MCG/ACT inhaler Inhale 2 puffs into the lungs 2 (two)  times daily. 1 Inhaler 5  . cetirizine (ZYRTEC) 10 MG tablet Take 10 mg by mouth daily.    . cholecalciferol (VITAMIN D) 1000 units tablet Take 1,000 Units by mouth daily.    Marland Kitchen dextromethorphan (DELSYM) 30 MG/5ML liquid Take 30 mg by mouth at bedtime as needed for cough.       . escitalopram (LEXAPRO) 20 MG tablet Take 20 mg by mouth daily.     . fluticasone (FLONASE) 50 MCG/ACT nasal spray Place 2 sprays into both nostrils daily. 16 g 5  . furosemide (LASIX) 40 MG tablet TAKE (1) TABLET BY MOUTH ONCE DAILY AS NEEDED FOR FLUID OR EDEMA 30 tablet 5  . HYDROcodone-homatropine (HYDROMET) 5-1.5 MG/5ML syrup Take 5 mLs by mouth every 12 (twelve) hours as needed. 180 mL 0  . levothyroxine (SYNTHROID, LEVOTHROID) 150 MCG tablet Take 150 mcg by mouth daily.     . metoprolol tartrate (LOPRESSOR) 25 MG tablet Take 25 mg by mouth 2 (two) times daily.    . Multiple Vitamin (MULTIVITAMIN WITH MINERALS) TABS tablet Take 1 tablet by mouth daily.    . polyethylene glycol (MIRALAX / GLYCOLAX) packet Take 17 g by mouth daily as needed for mild constipation.    . predniSONE (DELTASONE) 10 MG tablet Take 4 tablets for 3 days, 3 tablets for 3 days, 2 tablets for 3 days, 1 tablet for 3 days 30 tablet 0  . tolterodine (DETROL LA) 2 MG 24 hr capsule Take 2 mg by mouth 2 (two) times daily.     . traZODone (DESYREL) 100 MG tablet Take 100 mg by mouth at bedtime.     Marland Kitchen umeclidinium bromide (INCRUSE ELLIPTA) 62.5 MCG/INH AEPB Inhale 1 puff into the lungs at bedtime. 1 each 5  . VOLTAREN 1 % GEL Apply 2 g topically 4 (four) times daily as needed. Apply to hand for pain     No facility-administered medications prior to visit.         Objective:   Physical Exam  Vitals:   10/17/16 1541  BP: 112/62  SpO2: 93%  Weight: 158 lb 9.6 oz (71.9 kg)  Height: 5' 5.5" (1.664 m)    GEN: A/Ox3; pleasant , NAD, elderly, appears ill   NECK:  No LAd or stridor  RESP  Decreased BS, bilateral exp wheezes  CARD:  RRR, no m/r/g  , no peripheral edema, some B LE cyanotic changes.   Musco: B ankle edema  Neuro: alert, no focal deficits noted.    Skin: Warm, no lesions or rashes      Assessment & Plan:  COPD (chronic obstructive pulmonary disease) With an acute exacerbation that has not  improved with outpt pred and levaquin. Will admit her to hospital for further care. Refer also to inpatient note  Baltazar Apo, MD, PhD 10/17/2016, 5:25 PM [facility] [phone] or if no answer [phone]

## 2016-10-17 NOTE — Patient Instructions (Signed)
We

## 2016-10-17 NOTE — H&P (Signed)
Name: [name] MRN: [id number] DOB: Nov 26, 1932    ADMISSION DATE:  10/17/2016  CHIEF COMPLAINT:  Dyspnea, cough and wheeze  STUDIES:  CXR 2/1 >>     HISTORY OF PRESENT ILLNESS:  Very pleasant 83 year old woman with a history of severe COPD, hypertension with diastolic CHF, chronic hypoxemic respiratory failure, bladder CA for which she underwent surgical resection followed by radiation therapy completed end of December. He has been more fatigued since this treatment. I treated her for an acute exacerbation in the beginning of December. She now has experienced cough productive of clear mucous, increased wheezing, increased dyspnea and some associated desaturations on her usual 3 L/m oxygen. She was treated as an outpatient with prednisone and levofloxacin. Her caregivers believe that she has not improved and in fact she is actually worse that treatment. On my evaluation today she looks ill, she has bilateral wheezing. She has rhinitis and a wet cough. She denies any fevers or chills, body aches.    PAST MEDICAL HISTORY :   has a past medical history of Allergic rhinitis; Anemia (04/23/2012); Anginal pain ([place]); Arthritis; Asthma; Breast cancer ([place]); COPD (chronic obstructive pulmonary disease) ([place]); Coronary atherosclerosis of native coronary artery; Dyspnea (04/23/2012); Edema; Emphysema; Emphysema; Gastric AVM; H/O hiatal hernia; Heart murmur; History of blood transfusion; Hypertension; Hypertr obst cardiomyop; Hypothyroidism; Occlusion and stenosis of carotid artery; Oxygen dependent (04/23/2012); Peripheral vascular disease, unspecified; Pneumonia; Pulmonary hypertension; and Stroke ([place]) (~ 2004).  has a past surgical history that includes Carotid endarterectomy (2005); Thyroidectomy (2007); Appendectomy (1950); Breast lumpectomy (2002); Dilation and curettage of uterus (1950's); Cataract extraction w/ intraocular lens implant (02/2011); Excisional hemorrhoidectomy (1950's); Cardiac  catheterization; and Colonoscopy (N/A, 01/12/2014). Prior to Admission medications   Medication Sig Start Date End Date Taking? Authorizing Provider  albuterol (PROVENTIL HFA;VENTOLIN HFA) 108 (90 Base) MCG/ACT inhaler Inhale 2 puffs into the lungs every 6 (six) hours as needed for wheezing or shortness of breath. 06/28/16   Tammy S Parrett, NP  atorvastatin (LIPITOR) 20 MG tablet Take 20 mg by mouth daily.      Historical Provider, MD  benzonatate (TESSALON) 200 MG capsule Take 1 capsule (200 mg total) by mouth 3 (three) times daily as needed for cough. 06/28/16 06/28/17  Tammy S Parrett, NP  budesonide-formoterol (SYMBICORT) 160-4.5 MCG/ACT inhaler Inhale 2 puffs into the lungs 2 (two) times daily. 06/28/16   Tammy S Parrett, NP  cetirizine (ZYRTEC) 10 MG tablet Take 10 mg by mouth daily.    Historical Provider, MD  cholecalciferol (VITAMIN D) 1000 units tablet Take 1,000 Units by mouth daily.    Historical Provider, MD  dextromethorphan (DELSYM) 30 MG/5ML liquid Take 30 mg by mouth at bedtime as needed for cough.     Historical Provider, MD  escitalopram (LEXAPRO) 20 MG tablet Take 20 mg by mouth daily.     Historical Provider, MD  fluticasone (FLONASE) 50 MCG/ACT nasal spray Place 2 sprays into both nostrils daily. 04/14/15   Collene Gobble, MD  furosemide (LASIX) 40 MG tablet TAKE (1) TABLET BY MOUTH ONCE DAILY AS NEEDED FOR FLUID OR EDEMA 10/09/16   Collene Gobble, MD  HYDROcodone-homatropine (HYDROMET) 5-1.5 MG/5ML syrup Take 5 mLs by mouth every 12 (twelve) hours as needed. 08/16/16   Collene Gobble, MD  levothyroxine (SYNTHROID, LEVOTHROID) 150 MCG tablet Take 150 mcg by mouth daily.     Historical Provider, MD  metoprolol tartrate (LOPRESSOR) 25 MG tablet Take 25 mg by mouth 2 (two) times daily.  Historical Provider, MD  Multiple Vitamin (MULTIVITAMIN WITH MINERALS) TABS tablet Take 1 tablet by mouth daily.    Historical Provider, MD  polyethylene glycol (MIRALAX / GLYCOLAX) packet Take 17 g  by mouth daily as needed for mild constipation.    Historical Provider, MD  predniSONE (DELTASONE) 10 MG tablet Take 4 tablets for 3 days, 3 tablets for 3 days, 2 tablets for 3 days, 1 tablet for 3 days 08/15/16   Collene Gobble, MD  tolterodine (DETROL LA) 2 MG 24 hr capsule Take 2 mg by mouth 2 (two) times daily.     Historical Provider, MD  traZODone (DESYREL) 100 MG tablet Take 100 mg by mouth at bedtime.  12/19/15   Historical Provider, MD  umeclidinium bromide (INCRUSE ELLIPTA) 62.5 MCG/INH AEPB Inhale 1 puff into the lungs at bedtime. 06/28/16   Tammy S Parrett, NP  VOLTAREN 1 % GEL Apply 2 g topically 4 (four) times daily as needed. Apply to hand for pain 01/17/16   Historical Provider, MD   Allergies  Allergen Reactions  . Latex Hives and Itching    FAMILY HISTORY:  family history includes Asthma in her sister and sister; COPD in her sister; Colon cancer in her brother and mother; Emphysema in her brother, father, and sister; Hypertension in her sister; Stroke in her mother. SOCIAL HISTORY:  reports that she quit smoking about 19 years ago. Her smoking use included Cigarettes. She has a 30.00 pack-year smoking history. She has never used smokeless tobacco. She reports that she does not drink alcohol or use drugs.  REVIEW OF SYSTEMS:  Bold are positive Constitutional: Negative for fever, chills, weight loss, malaise/fatigue and diaphoresis.  HENT: Negative for hearing loss, ear pain, nosebleeds, congestion, sore throat, neck pain, tinnitus and ear discharge.   Eyes: Negative for blurred vision, double vision, photophobia, pain, discharge and redness.  Respiratory: Negative for cough, hemoptysis, sputum production, shortness of breath, wheezing and stridor.   Cardiovascular: Negative for chest pain, palpitations, orthopnea, claudication, leg swelling and PND.  Gastrointestinal: Negative for heartburn, nausea, vomiting, abdominal pain, diarrhea, constipation, blood in stool and melena.    Genitourinary: Negative for dysuria, urgency, frequency, hematuria and flank pain.  Musculoskeletal: Negative for myalgias, back pain, joint pain and falls.  Skin: Negative for itching and rash.  Neurological: Negative for dizziness, tingling, tremors, sensory change, speech change, focal weakness, seizures, loss of consciousness, weakness and headaches.  Endo/Heme/Allergies: Negative for environmental allergies and polydipsia. Does not bruise/bleed easily.  SUBJECTIVE:   VITAL SIGNS: BP: (112)/(62) 112/62 (02/01 1541) SpO2:  [93 %] 93 % (02/01 1541) Weight:  [71.9 kg (158 lb 9.6 oz)] 71.9 kg (158 lb 9.6 oz) (02/01 1541)  PHYSICAL EXAMINATION: General:  Ill appearing, no distress Neuro:  Awake and alert, answering question appropriately, non-focal HEENT:  Rhinorrhea, eye red, some hoarse voice Cardiovascular:  Regular, no M Lungs: diffuse B exp wheezes  Abdomen: not examined Musculoskeletal:  Trace LE edema Skin:some cyanosis of her feet  No results for input(s): NA, K, CL, CO2, BUN, CREATININE, GLUCOSE in the last 168 hours. No results for input(s): HGB, HCT, WBC, PLT in the last 168 hours. No results found.  ASSESSMENT / PLAN:  1. Acute exacerbation COPD that has failed outpt therapy - admit for further care - start IV solumedrol - convert inhaled regimen to duoneb + budesonide for now - empiric abx as below - check flu screen, droplet precautions until returns - cough supression ordered  2. Possible CAP -  empiric azithro + ceftriaxone until CXR can be performed - adjust abx as indicated  3. Hx diastolic CHF without evidence for exacerbation at this time.  - continue home metoprolol - defer any diuresis at this time.  - atorvastatin  4. Allergic rhinitis - loratadine - fluticasone NS  5. Hx Bladder CA - substitute fesoterodine  6. Hypothyroidism - synthroid ordered - check TSH  7. Depression - home escitalopram - trazadone for sleep   8. Code status -  pt has been DNR on admissions in the past. I discussed with her niece [name]. We agree that she would not survive and should not undergo MV or CPR. I have placed DNR orders on the chart.    Baltazar Apo, MD, PhD 10/17/2016, 6:13 PM [facility] [phone] or if no answer [phone]

## 2016-10-18 MED ORDER — ALBUTEROL SULFATE (2.5 MG/3ML) 0.083% IN NEBU: 2.5000 mg | INHALATION_SOLUTION | RESPIRATORY_TRACT | Status: DC | PRN

## 2016-10-18 MED ORDER — ENOXAPARIN SODIUM 40 MG/0.4ML ~~LOC~~ SOLN
40.0000 mg | SUBCUTANEOUS | Status: DC
  Administered 2016-10-18 – 2016-10-25 (×8): 40 mg via SUBCUTANEOUS
  Filled 2016-10-18 (×9): qty 0.4

## 2016-10-18 MED ORDER — GUAIFENESIN ER 600 MG PO TB12
1200.0000 mg | ORAL_TABLET | Freq: Two times a day (BID) | ORAL | Status: DC
  Administered 2016-10-18 – 2016-10-25 (×15): 1200 mg via ORAL
  Filled 2016-10-18 (×15): qty 2

## 2016-10-18 MED ORDER — METHYLPREDNISOLONE SODIUM SUCC 40 MG IJ SOLR
40.0000 mg | Freq: Four times a day (QID) | INTRAMUSCULAR | Status: DC
  Administered 2016-10-18 – 2016-10-24 (×23): 40 mg via INTRAVENOUS
  Filled 2016-10-18 (×24): qty 1

## 2016-10-18 NOTE — Progress Notes (Signed)
ANTICOAGULATION CONSULT NOTE - Initial Consult  Pharmacy Consult for lovenox Indication: VTE prophylaxis  Allergies  Allergen Reactions  . Latex Hives and Itching    Patient Measurements: Height: 5' 5.5" (166.4 cm) Weight: 158 lb 9.6 oz (71.9 kg) IBW/kg (Calculated) : 58.15 Heparin Dosing Weight:   Vital Signs: Temp: 97.6 F (36.4 C) (02/02 0505) Temp Source: Oral (02/02 0505) BP: 133/72 (02/02 0505) Pulse Rate: 76 (02/02 0745)  Labs:  Recent Labs  10/17/16 1803  HGB 12.4  HCT 41.6  PLT 291  CREATININE 0.78    Estimated Creatinine Clearance: 54.5 mL/min (by C-G formula based on SCr of 0.78 mg/dL).   Medical History: Past Medical History:  Diagnosis Date  . Allergic rhinitis   . Anemia 04/23/2012   "severe @ times; think caused by colonic AVMs"  . Anginal pain ([place])    "pressure"  . Arthritis    "probably"  . Asthma   . Breast cancer ([place])   . COPD (chronic obstructive pulmonary disease) ([place])   . Coronary atherosclerosis of native coronary artery    Cath 1/11: RCA 50-60 ostial o/w normal. EF 70%.   . Dyspnea 04/23/2012   "all the time"  . Edema   . Emphysema   . Emphysema   . Gastric AVM   . H/O hiatal hernia   . Heart murmur   . History of blood transfusion    "many times"  . Hypertension   . Hypertr obst cardiomyop    Echo 10/10 EF 65-70% SW 1.6 PW 1.4 mild SAM no LVOT gradient at rest. Valsalva not performed Mild MR. No hyperenhance ment on MRI EF 69%.  . Hypothyroidism   . Occlusion and stenosis of carotid artery    s/p L CEA u/s 3/11. R 60-79%; L 40-59%. no change since 3/10  . Oxygen dependent 04/23/2012   "concentrated at night; liquid during day"  . Peripheral vascular disease, unspecified   . Pneumonia   . Pulmonary hypertension    Cath 1/11 RA 8, RV 43/6/14, PA 42/17 (29) PCWP 15 Fick  5.0/2.6. PVR 2.8    . Stroke [facility]) ~ 2004   denies residual on 04/23/2012     Assessment: Patient os an 83 y.o F with hx of severe COPD, CHF, bladder  cancer, chronic hypoxic respiratory failure and HTN admitted on 10/17/16 with c/o SOB, cough and wheezing.  To start lovenox for VTE prophylaxis.    Plan:  - lovenox 40 mg SQ q24h - pharmacy will sign off. Re-consult [place] if needed further assistance.  Thank you for asking pharmacy to participate in this patient's care.  Shivon Hackel P 10/18/2016,9:41 AM

## 2016-10-18 NOTE — Progress Notes (Signed)
PCCM Progress Note  Admission date: 10/17/2016  CC: Cough  HPI: 83 yo female former smoker with productive cough, wheeze, dyspnea, rhinitis and progressive hypoxia.  She failed outpt tx with levaquin and prednisone.  She had surgery and XRT for bladder cancer in December 2017.  She has hx of severe COPD on home oxygen, CAD, HTN, diastolic CHF, Aortic stenosis, Breast cancer, gastric AVM, HH, Hypothyroidism, CVA.  Subjective: Still has cough, chest congestion, and wheeze.  Feels like she is slowly getting better.  Denies sinus congestion, sore throat, fever, chest pain, nausea.  C/o constipation.  Vital signs: BP 133/72 (BP Location: Right Arm)   Pulse 76   Temp 97.6 F (36.4 C) (Oral)   Resp 17   Ht 5' 5.5" (1.664 m)   Wt 158 lb 9.6 oz (71.9 kg)   SpO2 91%   BMI 25.99 kg/m   Intake/outpt: I/O last 3 completed shifts: In: 2 [Other:120; IV Piggyback:300] Out: -   General: pleasant Neuro: alert, normal strength Eyes: wears glasses, pupils reactive ENT: no stridor Cardiac: regular, 2/6 SM Chest: b/l rhonchi with faint wheeze Abd: soft, non tender Ext: no edema Skin: no rashes   CMP Latest Ref Rng & Units 10/17/2016 06/25/2016 06/24/2016  Glucose 65 - 99 mg/dL 148(H) 208(H) 94  BUN 6 - 20 mg/dL 24(H) 18 18  Creatinine 0.44 - 1.00 mg/dL 0.78 0.63 0.62  Sodium 135 - 145 mmol/L 141 140 135  Potassium 3.5 - 5.1 mmol/L 4.5 4.1 3.6  Chloride 101 - 111 mmol/L 96(L) 97(L) 95(L)  CO2 22 - 32 mmol/L 37(H) 35(H) 35(H)  Calcium 8.9 - 10.3 mg/dL 9.3 8.5(L) 8.2(L)  Total Protein 6.5 - 8.1 g/dL 7.4 - 6.9  Total Bilirubin 0.3 - 1.2 mg/dL 0.4 - 0.7  Alkaline Phos 38 - 126 U/L 86 - 69  AST 15 - 41 U/L 16 - 26  ALT 14 - 54 U/L 11(L) - 14    CBC Latest Ref Rng & Units 10/17/2016 08/28/2016 06/25/2016  WBC 4.0 - 10.5 K/uL 8.8 10.4 5.2  Hemoglobin 12.0 - 15.0 g/dL 12.4 12.2 11.5(L)  Hematocrit 36.0 - 46.0 % 41.6 37.9 37.3  Platelets 150 - 400 K/uL 291 253.0 189    Imaging: Portable  Chest 1 View  Result Date: 10/17/2016 CLINICAL DATA:  Cough EXAM: PORTABLE CHEST 1 VIEW COMPARISON:  06/24/2016 chest radiograph. FINDINGS: Stable cardiomediastinal silhouette with top-normal heart size and aortic atherosclerosis. No pneumothorax. No pleural effusion. Emphysema. No overt pulmonary edema. No acute consolidative airspace disease. IMPRESSION: 1. Emphysema.  No acute pulmonary disease. 2. Aortic atherosclerosis. Electronically Signed   By: Ilona Sorrel M.D.   On: 10/17/2016 19:21    Studies: Echo 01/17/14 >> mod LVH, EF 65 to 70%, mod AS, mild MS, mild/mod LA dilation, mod TR, PAS 42 mmHg PFT 04/15/16 >> FEV1 1.08 (54%), FEV1% 59, TLC 4.83 (92%), DLCO 28%  Cultures: Influenza PCR 2/01 >> negative   Antibiotics: Rocephin 2/01 >> Zithromax 2/01 >>   Assessment/plan:  Acute on chronic hypoxic respiratory failure. AECOPD. - oxygen to keep SpO2 90 to 95% - pulmicort, duoneb - continue solumedrol - day 2 of ABx - mucinex, flutter valve  Hx of allergic rhinitis. - flonase, claritin  Hx of CAD, chronic diastolic CHF, HTN, AS, HLD. - lipitor, lopressor  Hx hypothyroidism. - synthroid  Bladder cancer s/p resection and XRT December 2017. - fesoterodine  Constipation. - miralax  Hx of depression, insomnia. - escitalopram - trazodone qhs  DVT prophylaxis -  Lovenox SUP - Not indicated Nutrition - heart healthy diet Goals of care - DNR  Time spent 36 minutes  Chesley Mires, MD [place] 10/18/2016, 9:00 AM Pager:  [phone] After 3pm call: [phone]

## 2016-10-19 MED ORDER — ALPRAZOLAM 0.5 MG PO TABS
0.5000 mg | ORAL_TABLET | Freq: Every evening | ORAL | Status: DC | PRN
  Administered 2016-10-19 – 2016-10-25 (×6): 0.5 mg via ORAL
  Filled 2016-10-19 (×6): qty 1

## 2016-10-19 MED ORDER — AZITHROMYCIN 250 MG PO TABS
250.0000 mg | ORAL_TABLET | Freq: Every day | ORAL | Status: AC
  Administered 2016-10-19 – 2016-10-21 (×3): 250 mg via ORAL
  Filled 2016-10-19 (×3): qty 1

## 2016-10-19 NOTE — Progress Notes (Signed)
PCCM Progress Note  Admission date: 10/17/2016  CC: Cough  HPI: 83 yo female former smoker with productive cough, wheeze, dyspnea, rhinitis and progressive hypoxia.  She failed outpt tx with levaquin and prednisone.  She had surgery and XRT for bladder cancer in December 2017.  She has hx of severe COPD on home oxygen, CAD, HTN, diastolic CHF, Aortic stenosis, Breast cancer, gastric AVM, HH, Hypothyroidism, CVA.  Subjective: Harsh cough and UA noise. Feels that she needs to clear secretions but cannot.   Vital signs: BP 130/68 (BP Location: Left Arm)   Pulse 94   Temp 97.8 F (36.6 C) (Oral)   Resp 14   Ht 5' 5.5" (1.664 m)   Wt 71.9 kg (158 lb 9.6 oz)   SpO2 99%   BMI 25.99 kg/m   Intake/outpt: I/O last 3 completed shifts: In: 1500 [P.O.:1080; Other:120; IV Piggyback:300] Out: -  Gen: Pleasant, well-nourished, in no distress - much more comfortable,  normal affect  ENT: No lesions,  mouth clear,  oropharynx clear, no postnasal drip  Neck: No JVD, harsh UA noise and secretions  Lungs: No use of accessory muscles, distant, prolonged exp, mostly UA noise  Cardiovascular: RRR, heart sounds normal, no murmur or gallops, no peripheral edema  Abdomen: soft and NT, no HSM,  BS normal  Musculoskeletal: No deformities, no cyanosis or clubbing  Neuro: alert, non focal  Skin: Warm, no lesions or rashes    CMP Latest Ref Rng & Units 10/17/2016 06/25/2016 06/24/2016  Glucose 65 - 99 mg/dL 148(H) 208(H) 94  BUN 6 - 20 mg/dL 24(H) 18 18  Creatinine 0.44 - 1.00 mg/dL 0.78 0.63 0.62  Sodium 135 - 145 mmol/L 141 140 135  Potassium 3.5 - 5.1 mmol/L 4.5 4.1 3.6  Chloride 101 - 111 mmol/L 96(L) 97(L) 95(L)  CO2 22 - 32 mmol/L 37(H) 35(H) 35(H)  Calcium 8.9 - 10.3 mg/dL 9.3 8.5(L) 8.2(L)  Total Protein 6.5 - 8.1 g/dL 7.4 - 6.9  Total Bilirubin 0.3 - 1.2 mg/dL 0.4 - 0.7  Alkaline Phos 38 - 126 U/L 86 - 69  AST 15 - 41 U/L 16 - 26  ALT 14 - 54 U/L 11(L) - 14    CBC Latest Ref Rng &  Units 10/17/2016 08/28/2016 06/25/2016  WBC 4.0 - 10.5 K/uL 8.8 10.4 5.2  Hemoglobin 12.0 - 15.0 g/dL 12.4 12.2 11.5(L)  Hematocrit 36.0 - 46.0 % 41.6 37.9 37.3  Platelets 150 - 400 K/uL 291 253.0 189    Imaging: Portable Chest 1 View  Result Date: 10/17/2016 CLINICAL DATA:  Cough EXAM: PORTABLE CHEST 1 VIEW COMPARISON:  06/24/2016 chest radiograph. FINDINGS: Stable cardiomediastinal silhouette with top-normal heart size and aortic atherosclerosis. No pneumothorax. No pleural effusion. Emphysema. No overt pulmonary edema. No acute consolidative airspace disease. IMPRESSION: 1. Emphysema.  No acute pulmonary disease. 2. Aortic atherosclerosis. Electronically Signed   By: Ilona Sorrel M.D.   On: 10/17/2016 19:21    Studies: Echo 01/17/14 >> mod LVH, EF 65 to 70%, mod AS, mild MS, mild/mod LA dilation, mod TR, PAS 42 mmHg PFT 04/15/16 >> FEV1 1.08 (54%), FEV1% 59, TLC 4.83 (92%), DLCO 28%  Cultures: Influenza PCR 2/01 >> negative   Antibiotics:  Rocephin 2/01 >> 2/3 Zithromax 2/01 >> 2/3  Assessment/plan:  AE-COPD, failed outpt therapy Acute on chronic resp failure w hypoxemia - O2 goal SpO2 > 90% - duoneb and pulmicort scheduled - solumedrol for another day - change abx to azithro alone in absence PNA -  mucinex  + flutter  Allergic rhinitis - fluticasone, loratadine  CAD HTN Chronic dCHF without exacerbation AS Hyperlipidemia  - Metoprolol - Atorvastatin  Hypothyroidism - synthroid  Bladder cancer s/p resection and XRT December 2017. - fesoterodine  Constipation. - miralax prn  Hx of depression, insomnia. - escitalopram - trazodone qhs - add xanax qhs prn at her request today  DVT prophylaxis - Lovenox SUP - Not indicated Nutrition - heart healthy diet Goals of care - DNR  Time spent 35 minutes   Baltazar Apo, MD, PhD 10/19/2016, 12:13 PM [facility] [phone] or if no answer [phone]

## 2016-10-20 NOTE — Progress Notes (Signed)
PCCM Progress Note  Admission date: 10/17/2016  CC: Cough  HPI: 83 yo female former smoker with productive cough, wheeze, dyspnea, rhinitis and progressive hypoxia.  She failed outpt tx with levaquin and prednisone.  She had surgery and XRT for bladder cancer in December 2017.  She has hx of severe COPD on home oxygen, CAD, HTN, diastolic CHF, Aortic stenosis, Breast cancer, gastric AVM, HH, Hypothyroidism, CVA.  Subjective: More SOB today, but at the same time she wants to go home.   Vital signs: BP 137/67 (BP Location: Left Arm)   Pulse 77   Temp 98 F (36.7 C) (Oral)   Resp 18   Ht 5' 5.5" (1.664 m)   Wt 71.9 kg (158 lb 9.6 oz)   SpO2 97%   BMI 25.99 kg/m   Intake/outpt: I/O last 3 completed shifts: In: 1200 [P.O.:1200] Out: -   Gen: Pleasant, well-nourished, more dyspneic today,  normal affect  ENT: No lesions,  mouth clear,  oropharynx clear, no postnasal drip  Neck: No JVD, harsh UA stridor  Lungs: No use of accessory muscles, distant, prolonged exp, UA noise as well  Cardiovascular: RRR, heart sounds normal, no murmur or gallops, no peripheral edema  Musculoskeletal: No deformities, no cyanosis or clubbing  Neuro: alert, non focal  Skin: Warm, no lesions or rashes     CMP Latest Ref Rng & Units 10/17/2016 06/25/2016 06/24/2016  Glucose 65 - 99 mg/dL 148(H) 208(H) 94  BUN 6 - 20 mg/dL 24(H) 18 18  Creatinine 0.44 - 1.00 mg/dL 0.78 0.63 0.62  Sodium 135 - 145 mmol/L 141 140 135  Potassium 3.5 - 5.1 mmol/L 4.5 4.1 3.6  Chloride 101 - 111 mmol/L 96(L) 97(L) 95(L)  CO2 22 - 32 mmol/L 37(H) 35(H) 35(H)  Calcium 8.9 - 10.3 mg/dL 9.3 8.5(L) 8.2(L)  Total Protein 6.5 - 8.1 g/dL 7.4 - 6.9  Total Bilirubin 0.3 - 1.2 mg/dL 0.4 - 0.7  Alkaline Phos 38 - 126 U/L 86 - 69  AST 15 - 41 U/L 16 - 26  ALT 14 - 54 U/L 11(L) - 14    CBC Latest Ref Rng & Units 10/17/2016 08/28/2016 06/25/2016  WBC 4.0 - 10.5 K/uL 8.8 10.4 5.2  Hemoglobin 12.0 - 15.0 g/dL 12.4 12.2 11.5(L)   Hematocrit 36.0 - 46.0 % 41.6 37.9 37.3  Platelets 150 - 400 K/uL 291 253.0 189    Imaging: No results found.  Studies: Echo 01/17/14 >> mod LVH, EF 65 to 70%, mod AS, mild MS, mild/mod LA dilation, mod TR, PAS 42 mmHg PFT 04/15/16 >> FEV1 1.08 (54%), FEV1% 59, TLC 4.83 (92%), DLCO 28%  Cultures: Influenza PCR 2/01 >> negative   Antibiotics:  Rocephin 2/01 >> 2/3 Zithromax 2/01 >> 2/3  Assessment/plan:  AE-COPD, failed outpt therapy Acute on chronic resp failure w hypoxemia - O2 goal SpO2 > 90% - continue duoneb and pulmicort scheduled - continue solumedrol since she is worse today - continue azithro - continue mucinex  + flutter  Allergic rhinitis - continue fluticasone, loratadine  CAD HTN Chronic dCHF without exacerbation AS Hyperlipidemia  - continue Metoprolol - continue Atorvastatin  Hypothyroidism - continue synthroid  Bladder cancer s/p resection and XRT December 2017. - continue fesoterodine  Constipation. - continue miralax prn  Hx of depression, insomnia. - continue escitalopram - continue trazodone qhs - continue xanax qhs prn at her request   DVT prophylaxis - Lovenox SUP - Not indicated Nutrition - heart healthy diet Goals of care - DNR  Time spent 35 minutes   Baltazar Apo, MD, PhD 10/20/2016, 12:30 PM [facility] [phone] or if no answer [phone]

## 2016-10-21 ENCOUNTER — Encounter (HOSPITAL_COMMUNITY): Payer: Self-pay | Admitting: *Deleted

## 2016-10-21 MED ORDER — FUROSEMIDE 10 MG/ML IJ SOLN
40.0000 mg | Freq: Once | INTRAMUSCULAR | Status: AC
  Administered 2016-10-21: 40 mg via INTRAVENOUS
  Filled 2016-10-21: qty 4

## 2016-10-21 MED ORDER — BUDESONIDE 0.5 MG/2ML IN SUSP
0.5000 mg | Freq: Two times a day (BID) | RESPIRATORY_TRACT | Status: DC
  Administered 2016-10-21 – 2016-10-25 (×8): 0.5 mg via RESPIRATORY_TRACT
  Filled 2016-10-21 (×8): qty 2

## 2016-10-21 NOTE — Progress Notes (Signed)
PCCM Progress Note  Admission date: 10/17/2016  CC: Cough  HPI: 83 yo female former smoker with productive cough, wheeze, dyspnea, rhinitis and progressive hypoxia.  She failed outpt tx with levaquin and prednisone.  She had surgery and XRT for bladder cancer in December 2017.  She has hx of severe COPD on home oxygen, CAD, HTN, diastolic CHF, Aortic stenosis, Breast cancer, gastric AVM, HH, Hypothyroidism, CVA.  Subjective: More SOB today, but at the same time she wants to go home.   Vital signs: BP (!) 161/69 (BP Location: Left Arm)   Pulse 71   Temp 97.9 F (36.6 C) (Oral)   Resp 18   Ht 5' 5.5" (1.664 m)   Wt 158 lb 9.6 oz (71.9 kg)   SpO2 97%   BMI 25.99 kg/m  Oxygen 3 liters   General appearance:  83 Year old female, well nourished NAD  conversant  Eyes: anicteric sclerae, moist conjunctivae; PERRL, EOMI bilaterally. Mouth:  membranes and no mucosal ulcerations; normal hard and soft palate Neck: Trachea midline; neck supple, no JVD Lungs/chest: diffuse wheeze with increased respiratory effort after minimal movement CV: RRR, no MRGs  Abdomen: Soft, non-tender; no masses or HSM Extremities: No peripheral edema or extremity lymphadenopathy Skin: Normal temperature, turgor and texture; no rash, ulcers or subcutaneous nodules Psych: Appropriate affect, alert and oriented to person, place and time     CMP Latest Ref Rng & Units 10/17/2016 06/25/2016 06/24/2016  Glucose 65 - 99 mg/dL 148(H) 208(H) 94  BUN 6 - 20 mg/dL 24(H) 18 18  Creatinine 0.44 - 1.00 mg/dL 0.78 0.63 0.62  Sodium 135 - 145 mmol/L 141 140 135  Potassium 3.5 - 5.1 mmol/L 4.5 4.1 3.6  Chloride 101 - 111 mmol/L 96(L) 97(L) 95(L)  CO2 22 - 32 mmol/L 37(H) 35(H) 35(H)  Calcium 8.9 - 10.3 mg/dL 9.3 8.5(L) 8.2(L)  Total Protein 6.5 - 8.1 g/dL 7.4 - 6.9  Total Bilirubin 0.3 - 1.2 mg/dL 0.4 - 0.7  Alkaline Phos 38 - 126 U/L 86 - 69  AST 15 - 41 U/L 16 - 26  ALT 14 - 54 U/L 11(L) - 14    CBC Latest Ref Rng &  Units 10/17/2016 08/28/2016 06/25/2016  WBC 4.0 - 10.5 K/uL 8.8 10.4 5.2  Hemoglobin 12.0 - 15.0 g/dL 12.4 12.2 11.5(L)  Hematocrit 36.0 - 46.0 % 41.6 37.9 37.3  Platelets 150 - 400 K/uL 291 253.0 189    Imaging: No results found.  Studies: Echo 01/17/14 >> mod LVH, EF 65 to 70%, mod AS, mild MS, mild/mod LA dilation, mod TR, PAS 42 mmHg PFT 04/15/16 >> FEV1 1.08 (54%), FEV1% 59, TLC 4.83 (92%), DLCO 28%  Cultures: Influenza PCR 2/01 >> negative   Antibiotics:  Rocephin 2/01 >> 2/3 Zithromax 2/01 >> 2/3  Assessment/plan: AECOPD w/ acute on chronic hypoxic respiratory failure  Plan -wean o2 for sats >90% Cont duoneb/pulmicort Cont solumedrol at current dose Cont mucinex/flutter complete 5d azith  Lasix x1  CAD w/ chronic diastolic HF HL Plan Cont lopressor and statin  Allergic rhinitis  plan Cont fluticasone and H2B  Hypothyroidism  Plan Cont synthroid   H/o bladder CA s/p resection  Plan Cont fesoterodine   H/o depression and insomnia  Plan  Cont escitalopram, qhs trazodone and PRN xanax  She is still very tight w/ poor air movement and get very short of breath w/ minimal activity. Has a little more fluid in ankles than usual. She usually takes lasix. Will hold her  steroids at current. Give IV lasix and see how she does overnight. Likely looking at another 48 hrs here.   Erick Colace ACNP-BC [place] Pager # [phone] OR # [phone] if no answer

## 2016-10-21 NOTE — Care Management Important Message (Signed)
Important Message  Patient Details  Name: Aveona Greenlief MRN: [id number] Date of Birth: Feb 11, 1933   Medicare Important Message Given:  Yes    Kerin Salen 10/21/2016, 10:28 [place] Message  Patient Details  Name: Avarey Wildt MRN: [id number] Date of Birth: 03-28-1933   Medicare Important Message Given:  Yes    Kerin Salen 10/21/2016, 10:28 AM

## 2016-10-22 ENCOUNTER — Encounter (HOSPITAL_COMMUNITY): Payer: Self-pay | Admitting: *Deleted

## 2016-10-22 ENCOUNTER — Telehealth: Payer: Self-pay

## 2016-10-22 LAB — BASIC METABOLIC PANEL
Anion gap: 9 (ref 5–15)
BUN: 23 mg/dL — AB (ref 6–20)
CHLORIDE: 96 mmol/L — AB (ref 101–111)
CO2: 32 mmol/L (ref 22–32)
CREATININE: 0.67 mg/dL (ref 0.44–1.00)
Calcium: 7.9 mg/dL — ABNORMAL LOW (ref 8.9–10.3)
Glucose, Bld: 176 mg/dL — ABNORMAL HIGH (ref 65–99)
Potassium: 4.2 mmol/L (ref 3.5–5.1)
SODIUM: 137 mmol/L (ref 135–145)

## 2016-10-22 MED ORDER — FUROSEMIDE 40 MG PO TABS
40.0000 mg | ORAL_TABLET | Freq: Once | ORAL | Status: AC
  Administered 2016-10-22: 40 mg via ORAL
  Filled 2016-10-22: qty 1

## 2016-10-22 NOTE — Discharge Summary (Signed)
Physician Discharge Summary       Patient ID: [name] MRN: [id number] DOB/AGE: 81-Jun-1934 81 y.o.  Admit date: 10/17/2016 Discharge date: 10/25/2016  Discharge Diagnoses:   Acute exacerbation of COPD Acute on Chronic Hypoxic Respiratory Failure  CAD Chronic Diastolic Heart Failure Allergic Rhinitis  Hypothyroidism  H/o Bladder Cancer s/p resection  H/o Depression and Insomnia  Oral thrush   Detailed Hospital Course:  This is a 83 year old former smoker who was admitted on 2/1 w/ cc: cough, wheeze, rhinitis and progressive dyspnea and associated hypoxia. Was treated prior to presentation for these same symptoms with Levaquin and prednisone but failed to respond resulting in her admission.  She was admitted to the hospital w/ working dx of acute exacerbation of her COPD. She was treated in the usual fashion which included: supplemental oxygen, scheduled antibiotics, systemic steroids, and inhaled bronchodilators. Her Influenza screen was negative. Following admission she made slow but continuous recovery. Her antibiotics were narrowed, her systemic steroids were continued at higher dosing due to slow to resolve bronchospasm. She also had continued post-nasal gtt, thrush and upper airway irritation which were also aggressively treated starting 2/7. On 2/8 she was a little better but remained on higher pulse steroids. Of note on 2/6 we gave her IV feraheme at the direction of her hematologist Dr Burr Medico. By 2/9 she had improved to the point we felt it was safe to discharge her to home and she was sent with the following plan of care as outlined below.     Discharge Plan by active problems   AECOPD w/ acute on chronic hypoxic respiratory failure  -completed 5 d azith Plan  slow pred taper (starting at 60 mgx3d, then 50mg x3d, then slow taper from 40mg  every 5d.  Cont mucinex and flutter valve  Home on her regular BDs Hydocdan for cough Xanax for anxiety related to cough  Oxygen 3  liters  F/u w/ sara groce at 2pm 28th   Oral thrush Plan Cont nystatin thru steroids   GERD Plan Home on PPI (for now)   CAD w/ chronic diastolic HF Plan Home on statin and BB Resume lasix as an as needed medication for increased Lower extremity swelling or significant weight gain   Allergic rhinitis  Plan Continue flonase and H2 blocker and atarax   Hypothyroidism  Plan Cont synthroid  History of bladder cancer Plan Cont Fesoterodine   History of depression  Plan Continue  Escitalopram and trazodone   Chronic anemia  Plan F/u w/ PCP   Significant Hospital tests/ studies  Consults: dr Burr Medico   Discharge Exam: BP 132/64 (BP Location: Left Arm)   Pulse 72   Temp 98 F (36.7 C) (Oral)   Resp 16   Ht 5' 5.5" (1.664 m)   Wt 158 lb 9.6 oz (71.9 kg)   SpO2 92%   BMI 25.99 kg/m  General appearance:  83 Year old  female, well nourished,  NAD,   conversant  Eyes: anicteric sclerae, moist conjunctivae; PERRL, EOMI bilaterally. Mouth:  membranes and no mucosal ulcerations; normal hard and soft palate Neck: Trachea midline; neck supple, no JVD. Still w/ some creamy pin sized lesions on mucous membranes + upper airway coarse sounds  Lungs/chest: diffuse rhonchi and wheeze, with normal respiratory effort and no intercostal retractions CV: RRR, no MRGs  Abdomen: Soft, non-tender; no masses or HSM Extremities: No peripheral edema or extremity lymphadenopathy Skin: Normal temperature, turgor and texture; no rash, ulcers or subcutaneous nodules Psych: Appropriate  affect, alert and oriented to person, place and time   Labs at discharge Lab Results  Component Value Date   CREATININE 0.56 10/25/2016   BUN 26 (H) 10/25/2016   NA 140 10/25/2016   K 4.3 10/25/2016   CL 95 (L) 10/25/2016   CO2 39 (H) 10/25/2016   Lab Results  Component Value Date   WBC 12.7 (H) 10/25/2016   HGB 12.8 10/25/2016   HCT 41.5 10/25/2016   MCV 97.6 10/25/2016   PLT 264 10/25/2016   Lab  Results  Component Value Date   ALT 11 (L) 10/17/2016   AST 16 10/17/2016   ALKPHOS 86 10/17/2016   BILITOT 0.4 10/17/2016   Lab Results  Component Value Date   INR 0.92 04/23/2012   INR 0.93 09/13/2009    Current radiology studies Dg Chest Port 1 View  Result Date: 10/23/2016 CLINICAL DATA:  Shortness of breath. EXAM: PORTABLE CHEST 1 VIEW COMPARISON:  10/17/2016 FINDINGS: Heart is mildly enlarged. Chronic aortic atherosclerosis. Chronic emphysema with bullous disease more pronounced on the right than the left. Chronic crowding of markings at the lung bases. No sign of active infiltrate, mass, effusion or collapse. No acute bone finding. IMPRESSION: Chronic emphysema and pulmonary scarring. No active pulmonary process identified. Chronic cardiomegaly and aortic atherosclerosis. Electronically Signed   By: Nelson Chimes M.D.   On: 10/23/2016 11:17    Disposition:  01-Home or Self Care  Discharge Instructions    Call MD for:  difficulty breathing, headache or visual disturbances    Complete by:  As directed    Diet - low sodium heart healthy    Complete by:  As directed    Discharge instructions    Complete by:  As directed    Slowly progress your activity Continue the nystatin for as long as you are on the oral steroids.   For home use only DME oxygen    Complete by:  As directed    Mode or (Route):  Nasal cannula   Liters per Minute:  3   Frequency:  Continuous (stationary and portable oxygen unit needed)   Oxygen delivery system:  Gas   Increase activity slowly    Complete by:  As directed      Allergies as of 10/25/2016      Reactions   Latex Hives, Itching      Medication List    STOP taking these medications   benzonatate 200 MG capsule Commonly known as:  TESSALON   cetirizine 10 MG tablet Commonly known as:  ZYRTEC     TAKE these medications   albuterol 108 (90 Base) MCG/ACT inhaler Commonly known as:  PROVENTIL HFA;VENTOLIN HFA Inhale 2 puffs into the  lungs every 6 (six) hours as needed for wheezing or shortness of breath.   ALPRAZolam 0.5 MG tablet Commonly known as:  XANAX Take 1 tablet (0.5 mg total) by mouth at bedtime as needed for anxiety or sleep.   atorvastatin 20 MG tablet Commonly known as:  LIPITOR Take 20 mg by mouth daily.   budesonide-formoterol 160-4.5 MCG/ACT inhaler Commonly known as:  SYMBICORT Inhale 2 puffs into the lungs 2 (two) times daily.   cholecalciferol 1000 units tablet Commonly known as:  VITAMIN D Take 1,000 Units by mouth daily.   dextromethorphan 30 MG/5ML liquid Commonly known as:  DELSYM Take 30 mg by mouth at bedtime as needed for cough.   escitalopram 20 MG tablet Commonly known as:  LEXAPRO Take 20 mg by mouth  daily.   fluticasone 50 MCG/ACT nasal spray Commonly known as:  FLONASE Place 2 sprays into both nostrils 2 (two) times daily. What changed:  when to take this   furosemide 40 MG tablet Commonly known as:  LASIX TAKE (1) TABLET BY MOUTH ONCE DAILY AS NEEDED FOR FLUID OR EDEMA   guaiFENesin 600 MG 12 hr tablet Commonly known as:  MUCINEX Take 2 tablets (1,200 mg total) by mouth 2 (two) times daily.   HYDROcodone-homatropine 5-1.5 MG/5ML syrup Commonly known as:  HYDROMET Take 5 mLs by mouth every 12 (twelve) hours as needed for cough. What changed:  reasons to take this   hydrOXYzine 10 MG/5ML syrup Commonly known as:  ATARAX Take 5 mLs (10 mg total) by mouth 3 (three) times daily.   levothyroxine 150 MCG tablet Commonly known as:  SYNTHROID, LEVOTHROID Take 150 mcg by mouth daily.   metoprolol tartrate 25 MG tablet Commonly known as:  LOPRESSOR Take 25 mg by mouth 2 (two) times daily.   multivitamin with minerals Tabs tablet Take 1 tablet by mouth daily.   MYRBETRIQ 25 MG Tb24 tablet Generic drug:  mirabegron ER Take 25 mg by mouth every morning.   nystatin 100000 UNIT/ML suspension Commonly known as:  MYCOSTATIN Take 5 mLs (500,000 Units total) by mouth 4  (four) times daily.   pantoprazole 40 MG tablet Commonly known as:  PROTONIX Take 1 tablet (40 mg total) by mouth 2 (two) times daily.   polyethylene glycol packet Commonly known as:  MIRALAX / GLYCOLAX Take 17 g by mouth daily as needed for mild constipation.   predniSONE 10 MG tablet Commonly known as:  DELTASONE Take 6 tabsx3d, then 5tabx3d, then 4tabx5d, then 3tabx5d, then 2tabx5d, then 1tabx5d and stop What changed:  additional instructions   tolterodine 2 MG 24 hr capsule Commonly known as:  DETROL LA Take 2 mg by mouth 2 (two) times daily.   traZODone 100 MG tablet Commonly known as:  DESYREL Take 100 mg by mouth at bedtime.   umeclidinium bromide 62.5 MCG/INH Aepb Commonly known as:  INCRUSE ELLIPTA Inhale 1 puff into the lungs at bedtime. What changed:  when to take this   VOLTAREN 1 % Gel Generic drug:  diclofenac sodium Apply 2 g topically 4 (four) times daily as needed. Apply to hand for pain            Durable Medical Equipment        Start     Ordered   10/25/16 0000  For home use only DME oxygen    Question Answer Comment  Mode or (Route) Nasal cannula   Liters per Minute 3   Frequency Continuous (stationary and portable oxygen unit needed)   Oxygen delivery system Gas      10/25/16 1016       Discharged Condition: good  Physician Statement:   The Patient was personally examined, the discharge assessment and plan has been personally reviewed and I agree with ACNP Babcock's assessment and plan. 45 minutes of time have been dedicated to discharge assessment, planning and discharge instructions.   Signed: Clementeen Graham 10/25/2016, 10:23 AM

## 2016-10-22 NOTE — Progress Notes (Signed)
PCCM Progress Note  Admission date: 10/17/2016  CC: Cough  HPI: 83 yo female former smoker with productive cough, wheeze, dyspnea, rhinitis and progressive hypoxia.  She failed outpt tx with levaquin and prednisone.  She had surgery and XRT for bladder cancer in December 2017.  She has hx of severe COPD on home oxygen, CAD, HTN, diastolic CHF, Aortic stenosis, Breast cancer, gastric AVM, HH, Hypothyroidism, CVA.  Subjective: Remains sob, per niece during regular conversation C/o cough , non productive  Vital signs: BP (!) 142/74 (BP Location: Left Arm)   Pulse (!) 105   Temp 98.2 F (36.8 C) (Oral)   Resp 17   Ht 5' 5.5" (1.664 m)   Wt 158 lb 9.6 oz (71.9 kg)   SpO2 95%   BMI 25.99 kg/m  Oxygen 3 liters   General appearance:  83 Year old female, well nourished NAD  conversant  Eyes: anicteric sclerae, moist conjunctivae; PERRL, EOMI bilaterally. Mouth:  membranes and no mucosal ulcerations; normal hard and soft palate Neck: Trachea midline; neck supple, no JVD Lungs/chest: BL scattered rhonchi   CV: RRR, no MRGs  Abdomen: Soft, non-tender; no masses or HSM Extremities: No peripheral edema or extremity lymphadenopathy Skin: Normal temperature, turgor and texture; no rash, ulcers or subcutaneous nodules Psych: Appropriate affect, alert and oriented to person, place and time     CMP Latest Ref Rng & Units 10/22/2016 10/17/2016 06/25/2016  Glucose 65 - 99 mg/dL 176(H) 148(H) 208(H)  BUN 6 - 20 mg/dL 23(H) 24(H) 18  Creatinine 0.44 - 1.00 mg/dL 0.67 0.78 0.63  Sodium 135 - 145 mmol/L 137 141 140  Potassium 3.5 - 5.1 mmol/L 4.2 4.5 4.1  Chloride 101 - 111 mmol/L 96(L) 96(L) 97(L)  CO2 22 - 32 mmol/L 32 37(H) 35(H)  Calcium 8.9 - 10.3 mg/dL 7.9(L) 9.3 8.5(L)  Total Protein 6.5 - 8.1 g/dL - 7.4 -  Total Bilirubin 0.3 - 1.2 mg/dL - 0.4 -  Alkaline Phos 38 - 126 U/L - 86 -  AST 15 - 41 U/L - 16 -  ALT 14 - 54 U/L - 11(L) -    CBC Latest Ref Rng & Units 10/17/2016 08/28/2016  06/25/2016  WBC 4.0 - 10.5 K/uL 8.8 10.4 5.2  Hemoglobin 12.0 - 15.0 g/dL 12.4 12.2 11.5(L)  Hematocrit 36.0 - 46.0 % 41.6 37.9 37.3  Platelets 150 - 400 K/uL 291 253.0 189    Imaging: No results found.  Studies: Echo 01/17/14 >> mod LVH, EF 65 to 70%, mod AS, mild MS, mild/mod LA dilation, mod TR, PAS 42 mmHg PFT 04/15/16 >> FEV1 1.08 (54%), FEV1% 59, TLC 4.83 (92%), DLCO 28%  Cultures: Influenza PCR 2/01 >> negative   Antibiotics:  Rocephin 2/01 >> 2/3 Zithromax 2/01 >> 2/3  Assessment/plan: AECOPD w/ acute on chronic hypoxic respiratory failure  Plan -wean o2 for sats >90% Cont duoneb/pulmicort Cont solumedrol 40 q 12 Cont mucinex/flutter Dc  azith x 5ds   CAD w/ chronic diastolic HF HL Plan Cont lopressor and statin -may need change to cardioselective if remains persistent bronchospasm  Allergic rhinitis  plan Cont fluticasone and H2B  Hypothyroidism  Plan Cont synthroid   H/o bladder CA s/p resection  Plan Cont fesoterodine   H/o depression and insomnia  Plan  Cont escitalopram, qhs trazodone and PRN xanax   Slow to resolve COPD exacerbation. Poor social situation at home, lives with her older sister. Will need more improvement prior to discharge  Rigoberto Noel. MD

## 2016-10-22 NOTE — Plan of Care (Signed)
Problem: Safety: Goal: Ability to remain free from injury will improve Outcome: Progressing Requires one assist with transfer.

## 2016-10-22 NOTE — Telephone Encounter (Signed)
Niece called that pt is currently in [facility] hospital room 1328. [employer] drew ferritin and cbc 1/29. The ferritin at that time was 57. [facility] did not fax labs to Dr Burr Medico at that time. Niece s/w Youth worker at [facility] today and they should be faxing lab results now. Niece is asking if Dr Burr Medico can arrange a ferriheme infusion in hospital if possible.  Of note the pt just finished XRT for bladder cancer at [facility].  Please call niece [name] back with the plan [phone].

## 2016-10-23 ENCOUNTER — Inpatient Hospital Stay (HOSPITAL_COMMUNITY): Payer: Medicare Other

## 2016-10-23 ENCOUNTER — Telehealth: Payer: Self-pay

## 2016-10-23 DIAGNOSIS — Q2739 Arteriovenous malformation, other site: Secondary | ICD-10-CM

## 2016-10-23 DIAGNOSIS — I2510 Atherosclerotic heart disease of native coronary artery without angina pectoris: Secondary | ICD-10-CM

## 2016-10-23 DIAGNOSIS — D509 Iron deficiency anemia, unspecified: Secondary | ICD-10-CM

## 2016-10-23 DIAGNOSIS — Z8551 Personal history of malignant neoplasm of bladder: Secondary | ICD-10-CM

## 2016-10-23 DIAGNOSIS — I10 Essential (primary) hypertension: Secondary | ICD-10-CM

## 2016-10-23 DIAGNOSIS — I509 Heart failure, unspecified: Secondary | ICD-10-CM

## 2016-10-23 MED ORDER — OXYMETAZOLINE HCL 0.05 % NA SOLN
1.0000 | Freq: Two times a day (BID) | NASAL | Status: DC
  Administered 2016-10-23 – 2016-10-25 (×4): 1 via NASAL
  Filled 2016-10-23: qty 15

## 2016-10-23 MED ORDER — FUROSEMIDE 40 MG PO TABS
40.0000 mg | ORAL_TABLET | Freq: Every day | ORAL | Status: DC
  Administered 2016-10-24 – 2016-10-25 (×2): 40 mg via ORAL
  Filled 2016-10-23 (×3): qty 1

## 2016-10-23 MED ORDER — NYSTATIN 100000 UNIT/ML MT SUSP
5.0000 mL | Freq: Four times a day (QID) | OROMUCOSAL | Status: DC
  Administered 2016-10-23 – 2016-10-25 (×9): 500000 [IU] via ORAL
  Filled 2016-10-23 (×9): qty 5

## 2016-10-23 MED ORDER — FERUMOXYTOL INJECTION 510 MG/17 ML
510.0000 mg | Freq: Once | INTRAVENOUS | Status: AC
  Administered 2016-10-23: 510 mg via INTRAVENOUS
  Filled 2016-10-23: qty 17

## 2016-10-23 MED ORDER — HYDROXYZINE HCL 10 MG/5ML PO SYRP
10.0000 mg | ORAL_SOLUTION | Freq: Three times a day (TID) | ORAL | Status: DC
  Administered 2016-10-23 – 2016-10-25 (×7): 10 mg via ORAL
  Filled 2016-10-23 (×8): qty 5

## 2016-10-23 MED ORDER — POTASSIUM CHLORIDE CRYS ER 20 MEQ PO TBCR
20.0000 meq | EXTENDED_RELEASE_TABLET | Freq: Every day | ORAL | Status: DC
  Administered 2016-10-23 – 2016-10-25 (×3): 20 meq via ORAL
  Filled 2016-10-23 (×3): qty 1

## 2016-10-23 MED ORDER — FLUTICASONE PROPIONATE 50 MCG/ACT NA SUSP
2.0000 | Freq: Two times a day (BID) | NASAL | Status: DC
Start: 2016-10-23 — End: 2016-10-25
  Administered 2016-10-23 – 2016-10-25 (×4): 2 via NASAL
  Filled 2016-10-23: qty 16

## 2016-10-23 MED ORDER — PANTOPRAZOLE SODIUM 40 MG PO TBEC
40.0000 mg | DELAYED_RELEASE_TABLET | Freq: Two times a day (BID) | ORAL | Status: DC
  Administered 2016-10-23 – 2016-10-25 (×4): 40 mg via ORAL
  Filled 2016-10-23 (×5): qty 1

## 2016-10-23 NOTE — Telephone Encounter (Signed)
Niece called yesterday and again today about her aunts ferritin levels. Will print yesterday's note and place on Dr [name] desk.

## 2016-10-23 NOTE — Telephone Encounter (Signed)
S/w Dr Burr Medico and called niece back. Dr Burr Medico s/w hospitalist and feraheme is being ordered.

## 2016-10-23 NOTE — Progress Notes (Signed)
[name]   DOB:1933-06-05   [id number]   [id number]  Hematology follow up note  Subjective: I was notified that she was admitted for COPD exacerbation on 2/1. She overall has improved. Pt wants to know if she can get iv iron when she is in the hospital.    Objective:  Vitals:   10/23/16 1527 10/23/16 2034  BP: (!) 143/75 (!) 150/51  Pulse: 79 97  Resp: 18 16  Temp: 98 F (36.7 C) 97.9 F (36.6 C)    Body mass index is 25.99 kg/m.  Intake/Output Summary (Last 24 hours) at 10/23/16 2114 Last data filed at 10/23/16 2034  Gross per 24 hour  Intake              360 ml  Output              725 ml  Net             -365 ml     Sclerae unicteric  Oropharynx clear  No peripheral adenopathy  Lungs clear -- no rales or rhonchi  Heart regular rate and rhythm  Abdomen benign  MSK no focal spinal tenderness, no peripheral edema  Neuro nonfocal   CBG (last 3)  No results for input(s): GLUCAP in the last 72 hours.   Labs:  Lab Results  Component Value Date   WBC 8.8 10/17/2016   HGB 12.4 10/17/2016   HCT 41.6 10/17/2016   MCV 98.8 10/17/2016   PLT 291 10/17/2016   NEUTROABS 5.6 06/24/2016   CMP Latest Ref Rng & Units 10/22/2016 10/17/2016 06/25/2016  Glucose 65 - 99 mg/dL 176(H) 148(H) 208(H)  BUN 6 - 20 mg/dL 23(H) 24(H) 18  Creatinine 0.44 - 1.00 mg/dL 0.67 0.78 0.63  Sodium 135 - 145 mmol/L 137 141 140  Potassium 3.5 - 5.1 mmol/L 4.2 4.5 4.1  Chloride 101 - 111 mmol/L 96(L) 96(L) 97(L)  CO2 22 - 32 mmol/L 32 37(H) 35(H)  Calcium 8.9 - 10.3 mg/dL 7.9(L) 9.3 8.5(L)  Total Protein 6.5 - 8.1 g/dL - 7.4 -  Total Bilirubin 0.3 - 1.2 mg/dL - 0.4 -  Alkaline Phos 38 - 126 U/L - 86 -  AST 15 - 41 U/L - 16 -  ALT 14 - 54 U/L - 11(L) -     Urine Studies No results for input(s): UHGB, CRYS in the last 72 hours.  Invalid input(s): UACOL, UAPR, USPG, UPH, UTP, UGL, UKET, UBIL, UNIT, UROB, ULEU, UEPI, UWBC, URBC, UBAC, CAST, UCOM, BILUA  Basic Metabolic  Panel:  Recent Labs Lab 10/17/16 1803 10/22/16 0414  NA 141 137  K 4.5 4.2  CL 96* 96*  CO2 37* 32  GLUCOSE 148* 176*  BUN 24* 23*  CREATININE 0.78 0.67  CALCIUM 9.3 7.9*  MG 2.7*  --   PHOS 5.7*  --    GFR Estimated Creatinine Clearance: 54.5 mL/min (by C-G formula based on SCr of 0.67 mg/dL). Liver Function Tests:  Recent Labs Lab 10/17/16 1803  AST 16  ALT 11*  ALKPHOS 86  BILITOT 0.4  PROT 7.4  ALBUMIN 4.0   No results for input(s): LIPASE, AMYLASE in the last 168 hours. No results for input(s): AMMONIA in the last 168 hours. Coagulation profile No results for input(s): INR, PROTIME in the last 168 hours.  CBC:  Recent Labs Lab 10/17/16 1803  WBC 8.8  HGB 12.4  HCT 41.6  MCV 98.8  PLT 291   Cardiac Enzymes: No results for  input(s): CKTOTAL, CKMB, CKMBINDEX, TROPONINI in the last 168 hours. BNP: Invalid input(s): POCBNP CBG: No results for input(s): GLUCAP in the last 168 hours. D-Dimer No results for input(s): DDIMER in the last 72 hours. Hgb A1c No results for input(s): HGBA1C in the last 72 hours. Lipid Profile No results for input(s): CHOL, HDL, LDLCALC, TRIG, CHOLHDL, LDLDIRECT in the last 72 hours. Thyroid function studies No results for input(s): TSH, T4TOTAL, T3FREE, THYROIDAB in the last 72 hours.  Invalid input(s): FREET3 Anemia work up No results for input(s): VITAMINB12, FOLATE, FERRITIN, TIBC, IRON, RETICCTPCT in the last 72 hours. Microbiology No results found for this or any previous visit (from the past 240 hour(s)).    Studies:  Dg Chest Port 1 View  Result Date: 10/23/2016 CLINICAL DATA:  Shortness of breath. EXAM: PORTABLE CHEST 1 VIEW COMPARISON:  10/17/2016 FINDINGS: Heart is mildly enlarged. Chronic aortic atherosclerosis. Chronic emphysema with bullous disease more pronounced on the right than the left. Chronic crowding of markings at the lung bases. No sign of active infiltrate, mass, effusion or collapse. No acute  bone finding. IMPRESSION: Chronic emphysema and pulmonary scarring. No active pulmonary process identified. Chronic cardiomegaly and aortic atherosclerosis. Electronically Signed   By: Nelson Chimes M.D.   On: 10/23/2016 11:17    Assessment: 83 y.o. admitted for COPD exacerbation  1. Acute on chronic hypoxic respiratory failure secondary to COPD exacerbation 2. CAD with chronic CHF 3. Iron deficient anemia secondary to gastric AVM  4. HTN 5. History of bladder cancer status post surgery and radiation in December 2017   Plan:  -her recent iron study at PCP office showed ferritin 57 on 10/14/16. Hb 12.7. I recommend her to have one dose iv feraheme 510mg  while she is here. My goal is to keep her ferritin above 100 -COPD management per pulmonary service -I spoke with NP Marni Griffon today  -I plan to repeat her cbc and ferritin every 4 weeks at her PCP Dr. Mancel Bale' office, I will see her back in my clinic in 2 months, will set up her appointment    Truitt Merle, MD 10/23/2016  9:14 PM

## 2016-10-23 NOTE — Progress Notes (Signed)
PCCM Progress Note  Admission date: 10/17/2016  CC: Cough  HPI: 83 yo female former smoker with productive cough, wheeze, dyspnea, rhinitis and progressive hypoxia.  She failed outpt tx with levaquin and prednisone.  She had surgery and XRT for bladder cancer in December 2017.  She has hx of severe COPD on home oxygen, CAD, HTN, diastolic CHF, Aortic stenosis, Breast cancer, gastric AVM, HH, Hypothyroidism, CVA.  Subjective: Not that much better   Vital signs: BP (!) 141/70 (BP Location: Left Arm)   Pulse 87   Temp 97.9 F (36.6 C) (Oral)   Resp 18   Ht 5' 5.5" (1.664 m)   Wt 158 lb 9.6 oz (71.9 kg)   SpO2 97%   BMI 25.99 kg/m  3 liters   General appearance:  83 Year old  female, well nourished NAD,conversant  Eyes: anicteric sclerae, moist conjunctivae; PERRL, EOMI bilaterally. Mouth: moist membranes multiple white cream colored pin site lesions covering the sides of her mouth, and soft palate Neck: Trachea midline; neck supple, no JVD, pronounced upper airway wheeze  Lungs/chest: diffuse wheeze w/ occasional rhonchi after cough. Difficult to determine upper from lower airway wheeze, normal respiratory effort and no intercostal retractions CV: RRR, no MRGs  Abdomen: Soft, non-tender; no masses or HSM Extremities: No peripheral edema or extremity lymphadenopathy Skin: Normal temperature, turgor and texture; no rash, ulcers or subcutaneous nodules Psych: Appropriate affect, alert and oriented to person, place and time     CMP Latest Ref Rng & Units 10/22/2016 10/17/2016 06/25/2016  Glucose 65 - 99 mg/dL 176(H) 148(H) 208(H)  BUN 6 - 20 mg/dL 23(H) 24(H) 18  Creatinine 0.44 - 1.00 mg/dL 0.67 0.78 0.63  Sodium 135 - 145 mmol/L 137 141 140  Potassium 3.5 - 5.1 mmol/L 4.2 4.5 4.1  Chloride 101 - 111 mmol/L 96(L) 96(L) 97(L)  CO2 22 - 32 mmol/L 32 37(H) 35(H)  Calcium 8.9 - 10.3 mg/dL 7.9(L) 9.3 8.5(L)  Total Protein 6.5 - 8.1 g/dL - 7.4 -  Total Bilirubin 0.3 - 1.2 mg/dL - 0.4 -   Alkaline Phos 38 - 126 U/L - 86 -  AST 15 - 41 U/L - 16 -  ALT 14 - 54 U/L - 11(L) -    CBC Latest Ref Rng & Units 10/17/2016 08/28/2016 06/25/2016  WBC 4.0 - 10.5 K/uL 8.8 10.4 5.2  Hemoglobin 12.0 - 15.0 g/dL 12.4 12.2 11.5(L)  Hematocrit 36.0 - 46.0 % 41.6 37.9 37.3  Platelets 150 - 400 K/uL 291 253.0 189    Imaging: No results found.  Studies: Echo 01/17/14 >> mod LVH, EF 65 to 70%, mod AS, mild MS, mild/mod LA dilation, mod TR, PAS 42 mmHg PFT 04/15/16 >> FEV1 1.08 (54%), FEV1% 59, TLC 4.83 (92%), DLCO 28%  Cultures: Influenza PCR 2/01 >> negative   Antibiotics:  Rocephin 2/01 >> 2/3 Zithromax 2/01 >> 2/3  Assessment/plan:  Acute on chronic hypoxic respiratory failure in setting of AECOPD On-going cough w/ clear upper airway component Completed abx Plan Wean O2 Cont duoneb/pulmicort Weaning steroids Cont mucinex/flutter rx thrush Add empiric PPI CXR today   Oral thrush Plan Add nystatin  CAD w/ chronic diastolic HF HL Plan Cont lopressor Add daily lasix Am chemistry    Allergic rhinitis  And Post nasal gtt plan Cont nasal steroid, change antihistamine to first generation (atarax) Adding afrin  Chronic Anemia  F/b Burr Medico Last Ferritin 57.  Plan Will contact Burr Medico about ferriheme. Last infusion (ferumoxytol 510 MG/17ML Soln 17 mL Vial)  Happy to give in-pt if she things advisable   Hypothyroidism  Plan Cont synthroid   H/o bladder cancer s/p resection  Plan Cont fesoterodine   H/o depression Plan Cont Escitalopram and hs trazodone   Still w/ sig cough and symptom burden. Not much better. Seems to be a great deal of upper airway disease impacting her recovery. She does have thrush and significant post-nasal drainage. We will aggressively treat these to see if this helps with her cough and on-going airway irritation.   Erick Colace ACNP-BC [place] Pager # [phone] OR # [phone] if no answer

## 2016-10-24 LAB — BASIC METABOLIC PANEL
ANION GAP: 8 (ref 5–15)
BUN: 27 mg/dL — ABNORMAL HIGH (ref 6–20)
CO2: 35 mmol/L — ABNORMAL HIGH (ref 22–32)
Calcium: 7.8 mg/dL — ABNORMAL LOW (ref 8.9–10.3)
Chloride: 95 mmol/L — ABNORMAL LOW (ref 101–111)
Creatinine, Ser: 0.65 mg/dL (ref 0.44–1.00)
GFR calc Af Amer: >60 mL/min (ref >60)
Glucose, Bld: 192 mg/dL — ABNORMAL HIGH (ref 65–99)
POTASSIUM: 4 mmol/L (ref 3.5–5.1)
SODIUM: 138 mmol/L (ref 135–145)

## 2016-10-24 MED ORDER — GI COCKTAIL ~~LOC~~
30.0000 mL | Freq: Once | ORAL | Status: AC
  Administered 2016-10-24: 30 mL via ORAL
  Filled 2016-10-24: qty 30

## 2016-10-24 MED ORDER — METHYLPREDNISOLONE SODIUM SUCC 40 MG IJ SOLR
40.0000 mg | Freq: Three times a day (TID) | INTRAMUSCULAR | Status: DC
  Administered 2016-10-24 – 2016-10-25 (×4): 40 mg via INTRAVENOUS
  Filled 2016-10-24 (×4): qty 1

## 2016-10-24 NOTE — Progress Notes (Signed)
   Bedside RN calling eMD No camera to room  S: patient belching, epi pain and uncomfortable asking for mylanta. No nausea . No voit   Meds: steroids but also on PPI  O Vitals:   10/24/16 0429 10/24/16 0832 10/24/16 1141 10/24/16 1425  BP: 138/68   125/64  Pulse: 72   85  Resp: 12   16  Temp: 98.2 F (36.8 C)   97.9 F (36.6 C)  TempSrc: Oral   Oral  SpO2: 95% 96% 100% 97%  Weight:      Height:       No tenderness on palpation per RN Repeat exam - pain spontaneously improved   No results for input(s): TROPONINI in the last 168 hours.   A: gi pain nos -   P GI cocktail x 1 RN advised to call back - low threshold   Dr. Brand Males, M.D., [facility].C.P Pulmonary and Critical Care Medicine Staff Physician [place] [facility] Pager: [phone], If no answer or between  15:00h - 7:00h: call [phone]  10/24/2016 6:12 PM

## 2016-10-24 NOTE — Care Management Note (Signed)
Case Management Note  Patient Details  Name: [name] MRN: [id number] Date of Birth: 1932/12/10  Subjective/Objective: 83 y.o. F admitted 10/17/2016 with COPD Exacerbation from private residence  where she lives with Older sister. She tells me they have a [place] that stays from Tuesday-Tuesday each week and Cares for the both of them. She is adamant  On returning there where she has RW, Rollator, Cane, and very close bathroom. Home Oxygen provided by [employer].                  Action/Plan: Will continue to follow but anticipate no CM needs at this time.    Expected Discharge Date:  10/21/16               Expected Discharge Plan:  Home/Self Care  In-House Referral:  NA  Discharge planning Services  CM Consult  Post Acute Care Choice:  NA Choice offered to:  Patient  DME Arranged:  N/A (Has Rollator, RW, Oxygen and Cane) DME Agency:  [facility] (provides O2)  HH Arranged:  NA HH Agency:  NA  Status of Service:  Completed, signed off  If discussed at [employer] of Stay Meetings, dates discussed:    Additional Comments:  Delrae Sawyers, RN 10/24/2016, 2:09 PM

## 2016-10-24 NOTE — Progress Notes (Signed)
PCCM Progress Note  Admission date: 10/17/2016  CC: Cough  HPI: 83 yo female former smoker with productive cough, wheeze, dyspnea, rhinitis and progressive hypoxia.  She failed outpt tx with levaquin and prednisone.  She had surgery and XRT for bladder cancer in December 2017.  She has hx of severe COPD on home oxygen, CAD, HTN, diastolic CHF, Aortic stenosis, Breast cancer, gastric AVM, HH, Hypothyroidism, CVA.  Subjective: Feels better 2/8  Vital signs: BP 138/68 (BP Location: Left Arm)   Pulse 72   Temp 98.2 F (36.8 C) (Oral)   Resp 12   Ht 5' 5.5" (1.664 m)   Wt 158 lb 9.6 oz (71.9 kg)   SpO2 96%   BMI 25.99 kg/m  3 liters   General appearance:  83 Year old  female, well nourished NAD,conversant. Sitting on side of bed bathing self Eyes: anicteric sclerae, moist conjunctivae; PERRL, EOMI bilaterally. Mouth: moist membranes multiple white cream colored pin site lesions covering the sides of her mouth, and soft palate Neck: Trachea midline; neck supple, no JVD, pronounced upper airway wheeze cw vcd Lungs/chest: diffuse wheeze w/ occasional rhonchi after cough. Difficult to determine upper from lower airway wheeze, normal respiratory effort and no intercostal retractions CV: RRR, no MRGs  Abdomen: Soft, non-tender; no masses or HSM Extremities: No peripheral edema or extremity lymphadenopathy Skin: Normal temperature, turgor and texture; no rash, ulcers or subcutaneous nodules Psych: Appropriate affect, alert and oriented to person, place and time     CMP Latest Ref Rng & Units 10/24/2016 10/22/2016 10/17/2016  Glucose 65 - 99 mg/dL 192(H) 176(H) 148(H)  BUN 6 - 20 mg/dL 27(H) 23(H) 24(H)  Creatinine 0.44 - 1.00 mg/dL 0.65 0.67 0.78  Sodium 135 - 145 mmol/L 138 137 141  Potassium 3.5 - 5.1 mmol/L 4.0 4.2 4.5  Chloride 101 - 111 mmol/L 95(L) 96(L) 96(L)  CO2 22 - 32 mmol/L 35(H) 32 37(H)  Calcium 8.9 - 10.3 mg/dL 7.8(L) 7.9(L) 9.3  Total Protein 6.5 - 8.1 g/dL - - 7.4  Total  Bilirubin 0.3 - 1.2 mg/dL - - 0.4  Alkaline Phos 38 - 126 U/L - - 86  AST 15 - 41 U/L - - 16  ALT 14 - 54 U/L - - 11(L)    CBC Latest Ref Rng & Units 10/17/2016 08/28/2016 06/25/2016  WBC 4.0 - 10.5 K/uL 8.8 10.4 5.2  Hemoglobin 12.0 - 15.0 g/dL 12.4 12.2 11.5(L)  Hematocrit 36.0 - 46.0 % 41.6 37.9 37.3  Platelets 150 - 400 K/uL 291 253.0 189    Imaging: Dg Chest Port 1 View  Result Date: 10/23/2016 CLINICAL DATA:  Shortness of breath. EXAM: PORTABLE CHEST 1 VIEW COMPARISON:  10/17/2016 FINDINGS: Heart is mildly enlarged. Chronic aortic atherosclerosis. Chronic emphysema with bullous disease more pronounced on the right than the left. Chronic crowding of markings at the lung bases. No sign of active infiltrate, mass, effusion or collapse. No acute bone finding. IMPRESSION: Chronic emphysema and pulmonary scarring. No active pulmonary process identified. Chronic cardiomegaly and aortic atherosclerosis. Electronically Signed   By: Nelson Chimes M.D.   On: 10/23/2016 11:17    Studies: Echo 01/17/14 >> mod LVH, EF 65 to 70%, mod AS, mild MS, mild/mod LA dilation, mod TR, PAS 42 mmHg PFT 04/15/16 >> FEV1 1.08 (54%), FEV1% 59, TLC 4.83 (92%), DLCO 28%  Cultures: Influenza PCR 2/01 >> negative   Antibiotics:  Rocephin 2/01 >> 2/3 Zithromax 2/01 >> 2/3  Assessment/plan:  Acute on chronic hypoxic respiratory failure  in setting of AECOPD On-going cough w/ clear upper airway component Completed abx Plan Wean O2 Cont duoneb/pulmicort Weaning steroids Cont mucinex/flutter rx thrush Add empiric PPI CXR as needed  Oral thrush Plan Added nystatin  CAD w/ chronic diastolic HF HL Plan Cont lopressor Add daily lasix Am chemistry    Allergic rhinitis  And Post nasal gtt plan Cont nasal steroid, change antihistamine to first generation (atarax) Addied afrin 2/7  Chronic Anemia  No results for input(s): HGB in the last 72 hours.  F/b Burr Medico Last Ferritin 57.  Plan Dr.Feng note  form 2/7 appreciated and treatment noted. Outpatient Follow up appointments noted.    Hypothyroidism  Plan Cont synthroid   H/o bladder cancer s/p resection  Plan Cont fesoterodine   H/o depression Plan Cont Escitalopram and hs trazodone   Reports being better, still with cough and congestion with little improvement.Richardson Landry Minor ACNP [facility] PCCM Pager [phone] till 3 pm If no answer page [phone] 10/24/2016, 9:19 AM

## 2016-10-24 NOTE — Progress Notes (Signed)
Pt had  RESP.TX within 45 min to 1 hour she developed pain in her epigastric area and described the pain as "heartburn and very uncomfortable.I have spoken with Dr Lamonte Sakai ,he  Stated that another MD would call me or place an order for mylanta

## 2016-10-25 LAB — CBC
HCT: 41.5 % (ref 36.0–46.0)
Hemoglobin: 12.8 g/dL (ref 12.0–15.0)
MCH: 30.1 pg (ref 26.0–34.0)
MCHC: 30.8 g/dL (ref 30.0–36.0)
MCV: 97.6 fL (ref 78.0–100.0)
Platelets: 264 10*3/uL (ref 150–400)
RBC: 4.25 MIL/uL (ref 3.87–5.11)
RDW: 13.1 % (ref 11.5–15.5)
WBC: 12.7 10*3/uL — ABNORMAL HIGH (ref 4.0–10.5)

## 2016-10-25 LAB — BASIC METABOLIC PANEL
Anion gap: 6 (ref 5–15)
BUN: 26 mg/dL — AB (ref 6–20)
CALCIUM: 7.8 mg/dL — AB (ref 8.9–10.3)
CO2: 39 mmol/L — ABNORMAL HIGH (ref 22–32)
CREATININE: 0.56 mg/dL (ref 0.44–1.00)
Chloride: 95 mmol/L — ABNORMAL LOW (ref 101–111)
GFR calc Af Amer: >60 mL/min (ref >60)
GLUCOSE: 209 mg/dL — AB (ref 65–99)
Potassium: 4.3 mmol/L (ref 3.5–5.1)
Sodium: 140 mmol/L (ref 135–145)

## 2016-10-25 LAB — PHOSPHORUS: PHOSPHORUS: 3.1 mg/dL (ref 2.5–4.6)

## 2016-10-25 LAB — MAGNESIUM: MAGNESIUM: 2.9 mg/dL — AB (ref 1.7–2.4)

## 2016-10-25 MED ORDER — PANTOPRAZOLE SODIUM 40 MG PO TBEC: 40.0000 mg | DELAYED_RELEASE_TABLET | Freq: Two times a day (BID) | ORAL | 3 refills | Status: AC

## 2016-10-25 MED ORDER — GUAIFENESIN ER 600 MG PO TB12: 1200.0000 mg | ORAL_TABLET | Freq: Two times a day (BID) | ORAL | Status: DC

## 2016-10-25 MED ORDER — PREDNISONE 10 MG PO TABS
ORAL_TABLET | ORAL | 0 refills | Status: DC
Start: 2016-10-25 — End: 2016-12-19

## 2016-10-25 MED ORDER — HYDROCODONE-HOMATROPINE 5-1.5 MG/5ML PO SYRP: 5.0000 mL | ORAL_SOLUTION | Freq: Two times a day (BID) | ORAL | 0 refills | Status: DC | PRN

## 2016-10-25 MED ORDER — NYSTATIN 100000 UNIT/ML MT SUSP: 5.0000 mL | Freq: Four times a day (QID) | OROMUCOSAL | 0 refills | Status: DC

## 2016-10-25 MED ORDER — FLUTICASONE PROPIONATE 50 MCG/ACT NA SUSP: 2.0000 | Freq: Two times a day (BID) | NASAL | 2 refills | Status: DC

## 2016-10-25 MED ORDER — HYDROXYZINE HCL 10 MG/5ML PO SYRP: 10.0000 mg | ORAL_SOLUTION | Freq: Three times a day (TID) | ORAL | 0 refills | Status: DC

## 2016-10-25 MED ORDER — ALPRAZOLAM 0.5 MG PO TABS: 0.5000 mg | ORAL_TABLET | Freq: Every evening | ORAL | 0 refills | Status: DC | PRN

## 2016-10-25 NOTE — Care Management Note (Signed)
Case Management Note  Patient Details  Name: [name] MRN: [id number] Date of Birth: [birth year]  Subjective/Objective:  Patient states she uses [facility] for home 02-office re opens @ 1p-will call to confirm if patient is active @ 1p. Nurse notified of 02 sats needed to be documented, & need for home 02 order since patient was on home 02 HS, & now noted for home 02 3l [place] continuous.Awaiting 02 sats, & home 02 order, & to confirm dme agency for home 02.                  Action/Plan:d/c plan home w/home 02   Expected Discharge Date:  10/25/16               Expected Discharge Plan:  Home/Self Care  In-House Referral:  NA  Discharge planning Services  CM Consult  Post Acute Care Choice:  NA Choice offered to:  Patient  DME Arranged:  N/A (Has Rollator, RW, Oxygen and Cane) DME Agency:  [place] (provides O2)  HH Arranged:  NA HH Agency:  NA  Status of Service:  Completed, signed off  If discussed at [place] of Stay Meetings, dates discussed:    Additional Comments:  Dessa Phi, RN 10/25/2016, 12:19 PM

## 2016-10-25 NOTE — Progress Notes (Signed)
SATURATION QUALIFICATIONS: (This note is used to comply with regulatory documentation for home oxygen)  Patient Saturations on Room Air at Rest = *88%  Patient Saturations on Room Air while Ambulating = *85%  Patient Saturations on **2 Liters of oxygen while Ambulating = *94%  Please briefly explain why patient needs home oxygen: 

## 2016-10-25 NOTE — Progress Notes (Signed)
Pt discharged via wheelchair to home with brother in stable condition.  Discharge instructions and scripts given. Other scripts sent to pharmacy of choice. Pt verbalized understanding. No immediate questions or concerns.

## 2016-10-25 NOTE — Discharge Instructions (Signed)
Continue the flonase twice a day until nasal congestion and sinus congestion is resolved  Continue the Hydroxyzine three times a day until nasal congestion gone and cough resolved.

## 2016-10-25 NOTE — Care Management Important Message (Signed)
Important Message  Patient Details  Name: [name] MRN: [id number] Date of Birth: [birth year]   Medicare Important Message Given:  Yes    Kerin Salen 10/25/2016, 12:14 PMImportant Message  Patient Details  Name: [name] MRN: [id number] Date of Birth: 07-01-1933   Medicare Important Message Given:  Yes    Kerin Salen 10/25/2016, 12:13 PM

## 2016-10-25 NOTE — Progress Notes (Signed)
Inpatient Diabetes Program Recommendations  AACE/ADA: New Consensus Statement on Inpatient Glycemic Control (2015)  Target Ranges:  Prepandial:   less than 140 mg/dL      Peak postprandial:   less than 180 mg/dL (1-2 hours)      Critically ill patients:  140 - 180 mg/dL   Results for [name], [name] (MRN [id number]) as of 10/25/2016 09:15  Ref. Range 10/24/2016 04:29 10/25/2016 04:24  Glucose Latest Ref Range: 65 - 99 mg/dL 192 (H) 209 (H)    Admit with: Dyspnea/ Cough  NO History of DM noted.     MD- Note patient currently getting Solumedrol 40 mg Q8 hours.  Lab glucose levels elevated for the past 2 days.  Please consider placing orders for Novolog Sensitive Correction Scale/ SSI (0-9 units) TID AC + HS     --Will follow patient during hospitalization--  Wyn Quaker RN, MSN, CDE Diabetes Coordinator Inpatient Glycemic Control Team Team Pager: [phone] (8a-5p)

## 2016-10-31 ENCOUNTER — Ambulatory Visit: Payer: Medicare Other | Admitting: Emergency Medicine

## 2016-11-13 ENCOUNTER — Ambulatory Visit (INDEPENDENT_AMBULATORY_CARE_PROVIDER_SITE_OTHER): Payer: Medicare Other | Admitting: Acute Care

## 2016-11-13 ENCOUNTER — Encounter: Payer: Self-pay | Admitting: Acute Care

## 2016-11-13 VITALS — BP 114/64 | HR 99 | Ht 66.0 in | Wt 155.0 lb

## 2016-11-13 DIAGNOSIS — B370 Candidal stomatitis: Secondary | ICD-10-CM | POA: Diagnosis not present

## 2016-11-13 DIAGNOSIS — R609 Edema, unspecified: Secondary | ICD-10-CM

## 2016-11-13 DIAGNOSIS — M7989 Other specified soft tissue disorders: Secondary | ICD-10-CM | POA: Diagnosis not present

## 2016-11-13 DIAGNOSIS — J441 Chronic obstructive pulmonary disease with (acute) exacerbation: Secondary | ICD-10-CM

## 2016-11-13 MED ORDER — HYDROCODONE-HOMATROPINE 5-1.5 MG/5ML PO SYRP: 5.0000 mL | ORAL_SOLUTION | Freq: Two times a day (BID) | ORAL | 0 refills | Status: DC | PRN

## 2016-11-13 NOTE — Patient Instructions (Addendum)
It is good to see you today. We will schedule lower extremity doppler studies today. Finish prednisone taper Continue Nystain until prednisone if finished. Continue 3 L [place] as you have been doing. Continue your Symbicort and Incruse as you have been doing. Remember to rinse mouth after use. Take Zyrtec daily as you have been dong. Continue Protonix for now. We will renew your hydromet cough medication for night time. It will make you sleepy. Continue Delsym during the day. Avoid mint, menthol,and chocolate. We will give you a copy of the GERD diet. Follow up with Dr. Lamonte Sakai at first available. If cough is still an issue, Dr. Lamonte Sakai may schedule a CT Chest at that time. Decrease albuterol treatments to twice daily. Call if tremors continue after prednisone taper is complete, so we can refer you to a neurologist. Please contact office for sooner follow up if symptoms do not improve or worsen or seek emergency care

## 2016-11-13 NOTE — Progress Notes (Signed)
History of Present Illness [name] is a 83 y.o. female former smoker with severe COPD, HTN, Diastolic dysfunction and hypoxemia.She has bladder cancer which was treated with resection and radiation therapy 2017. She is followed by Dr. Lamonte Sakai.    11/13/2016 Hospital Follow Up: Pt. Presents for hospital follow up. [name] was hospitalized 10/17/2016-10/25/2016 for exacerbation of her COPD. She had a 2 week history of cough, wheeze, and rhinitis with progressive dyspnea associated with hypoxia. She failed outpatient treatment with Lavaquin and prednisone prior to admission.She was admitted with acute exacerbation of her COPD. Treatment included supplemental oxygen, scheduled antibiotics ( Azithro),systemic steroids and scheduled inhaled BD's. Influenza screen was negative.She required longer term steroids due to continued upper airway irritation. She also developed thrush and was treated with nystatin. She was discharged home 10/25/2016 with prednisone taper. Per Dr. Elsworth Soho, was to remain on 10 mg through todays office visit, Mucinex with flutter valve, Symbicort and Incruse,Hycodan for cough, oxygen at 3 L, and nystatin until oral steroids are complete.She presents today with continued cough. She states that it is better, but still there. It has never been productive. Per her daughter, it sounds wet, but there is never any sputum. She remains on her 3L [place]. She remains on the 20 mg daily , then will decrease to 10 mg and she will complete taper  next Tuesday. She states her breathing is tremendously better. Her saturation is 96% today in the office on her 3 L [place].She is using her nebs 3-4 times daily She denies cough, chest pain, fever, orthopnea or hemoptysis.We discussed the possibility of further work up for cough.She did have a PET scan  at [facility] 05/2016 which was negative for pulmonary mets.I discussed this with Dr. Lamonte Sakai , and the patient's daughter.We decided we will wait to do further work up of cough. If  she is still symptomatic when she returns to see Dr. Lamonte Sakai, we will consider CT Chest at that time.. Pt.  states she has developed a tremor with the prednisone and albuterol treatments 3-4 times daily.Lungs are clear with some very  faint expiratory wheezes. She has lower extremity edema which the patient states she has had for about 6 months. Her feet are dusky with cap refill greater than 3 seconds, but with + refill.She states her feet do not hurt, she does not have cramps.She last had an Echo and cardiac work up at [facility] in 02/2016. See Below:  Tremor>> Prednisone and 4 x daily albuterol nebs Asked her to decrease her nebs to twice daily and to let us know if the tremor does not go away after completing pred taper so we can refer to neurology. Consider cards follow up  Tests 10/23/2016 IMPRESSION: Chronic emphysema and pulmonary scarring. No active pulmonary process identified. Chronic cardiomegaly and aortic atherosclerosis.  PET/ Whole Body: 05/2016 [facility]) Diffuse thickening of the bladder wall, consistent with known bladder cancer. Soft tissue posterior to this may be uterine; however, if there is in fact a history of hysterectomy, this is then quite suspicious for a extension of the bladder cancer into the posterior pelvis. If it is a tumor, it is relatively nonavid. -No evidence for other metastasis.  Echo 02/2016:( [facility]) Hyperdynamic left ventricular systolic function, ejection fraction 65 to  70%  Echo contrast utilized to enhance endocardial border definition  Severe left ventricular outflow tract gradient-   Cardiac Cath 6/2017[facility])  Findings:  Mild-moderate coronary artery disease as described below.  Normal LV Function  by previous echo  Moderate pulmonary hypertension  Moderate aortic stenosis with caculated AVA approximately 1.1cm2   Severe, dynamic LVOT obstruction with resting peak gradient  approximately 100 mm Hg  Recommendations: 1. Medical  management, including aspirin 81mg  daily indefinitely.  2. Aggressive secondary prevention 3. Consider cardiac MRI to evaluate for hypertrophic heart disease   Past medical hx Past Medical History:  Diagnosis Date  . Allergic rhinitis   . Anemia 04/23/2012   "severe @ times; think caused by colonic AVMs"  . Anginal pain ([place])    "pressure"  . Arthritis    "probably"  . Asthma   . Breast cancer ([place])   . COPD (chronic obstructive pulmonary disease) ([place])   . Coronary atherosclerosis of native coronary artery    Cath 1/11: RCA 50-60 ostial o/w normal. EF 70%.   . Dyspnea 04/23/2012   "all the time"  . Edema   . Emphysema   . Emphysema   . Gastric AVM   . H/O hiatal hernia   . Heart murmur   . History of blood transfusion    "many times"  . Hypertension   . Hypertr obst cardiomyop    Echo 10/10 EF 65-70% SW 1.6 PW 1.4 mild SAM no LVOT gradient at rest. Valsalva not performed Mild MR. No hyperenhance ment on MRI EF 69%.  . Hypothyroidism   . Occlusion and stenosis of carotid artery    s/p L CEA u/s 3/11. R 60-79%; L 40-59%. no change since 3/10  . Oxygen dependent 04/23/2012   "concentrated at night; liquid during day"  . Peripheral vascular disease, unspecified   . Pneumonia   . Pulmonary hypertension    Cath 1/11 RA 8, RV 43/6/14, PA 42/17 (29) PCWP 15 Fick  5.0/2.6. PVR 2.8    . Stroke [facility]) ~ 2004   denies residual on 04/23/2012     Past surgical hx, Family hx, Social hx all reviewed.  Current Outpatient Prescriptions on File Prior to Visit  Medication Sig  . albuterol (PROVENTIL HFA;VENTOLIN HFA) 108 (90 Base) MCG/ACT inhaler Inhale 2 puffs into the lungs every 6 (six) hours as needed for wheezing or shortness of breath.  . ALPRAZolam (XANAX) 0.5 MG tablet Take 1 tablet (0.5 mg total) by mouth at bedtime as needed for anxiety or sleep.  Marland Kitchen atorvastatin (LIPITOR) 20 MG tablet Take 20 mg by mouth daily.    . budesonide-formoterol (SYMBICORT) 160-4.5 MCG/ACT inhaler Inhale 2  puffs into the lungs 2 (two) times daily.  . cholecalciferol (VITAMIN D) 1000 units tablet Take 1,000 Units by mouth daily.  Marland Kitchen dextromethorphan (DELSYM) 30 MG/5ML liquid Take 30 mg by mouth at bedtime as needed for cough.   . escitalopram (LEXAPRO) 20 MG tablet Take 20 mg by mouth daily.   . fluticasone (FLONASE) 50 MCG/ACT nasal spray Place 2 sprays into both nostrils 2 (two) times daily.  . furosemide (LASIX) 40 MG tablet TAKE (1) TABLET BY MOUTH ONCE DAILY AS NEEDED FOR FLUID OR EDEMA  . guaiFENesin (MUCINEX) 600 MG 12 hr tablet Take 2 tablets (1,200 mg total) by mouth 2 (two) times daily.  . hydrOXYzine (ATARAX) 10 MG/5ML syrup Take 5 mLs (10 mg total) by mouth 3 (three) times daily.  Marland Kitchen levothyroxine (SYNTHROID, LEVOTHROID) 150 MCG tablet Take 150 mcg by mouth daily.   . metoprolol tartrate (LOPRESSOR) 25 MG tablet Take 25 mg by mouth 2 (two) times daily.  . Multiple Vitamin (MULTIVITAMIN WITH MINERALS) TABS tablet Take 1 tablet by mouth  daily.  Marland Kitchen MYRBETRIQ 25 MG TB24 tablet Take 25 mg by mouth every morning.  . nystatin (MYCOSTATIN) 100000 UNIT/ML suspension Take 5 mLs (500,000 Units total) by mouth 4 (four) times daily.  . pantoprazole (PROTONIX) 40 MG tablet Take 1 tablet (40 mg total) by mouth 2 (two) times daily.  . polyethylene glycol (MIRALAX / GLYCOLAX) packet Take 17 g by mouth daily as needed for mild constipation.  . predniSONE (DELTASONE) 10 MG tablet Take 6 tabsx3d, then 5tabx3d, then 4tabx5d, then 3tabx5d, then 2tabx5d, then 1tabx5d and stop  . tolterodine (DETROL LA) 2 MG 24 hr capsule Take 2 mg by mouth 2 (two) times daily.   . traZODone (DESYREL) 100 MG tablet Take 100 mg by mouth at bedtime.   Marland Kitchen umeclidinium bromide (INCRUSE ELLIPTA) 62.5 MCG/INH AEPB Inhale 1 puff into the lungs at bedtime. (Patient taking differently: Inhale 1 puff into the lungs every morning. )  . VOLTAREN 1 % GEL Apply 2 g topically 4 (four) times daily as needed. Apply to hand for pain   No current  facility-administered medications on file prior to visit.      Allergies  Allergen Reactions  . Latex Hives and Itching    Review Of Systems:  Constitutional:   No  weight loss, night sweats,  Fevers, chills, fatigue, or  lassitude.  HEENT:   No headaches,  Difficulty swallowing,  Tooth/dental problems, or  Sore throat,                No sneezing, itching, ear ache, nasal congestion,+ post nasal drip,   CV:  No chest pain,  Orthopnea, PND, + swelling in lower extremities, no anasarca, dizziness, palpitations, syncope.   GI  No heartburn, indigestion, abdominal pain, nausea, vomiting, diarrhea, change in bowel habits, loss of appetite, bloody stools.   Resp: + shortness of breath with exertion less  at rest. Much better than it was, No excess mucus, no productive cough,  + non-productive cough,  No coughing up of blood.  No change in color of mucus.  + wheezing.  No chest wall deformity  Skin: no rash or lesions, feet are dusky with cap refill> 3 seconds..  GU: no dysuria, change in color of urine, no urgency or frequency.  No flank pain, no hematuria   MS:  No joint pain or swelling.  No decreased range of motion.  No back pain.  Psych:  No change in mood or affect. No depression or anxiety.  No memory loss.   Vital Signs BP 114/64 (BP Location: Left Arm, Cuff Size: Normal)   Pulse 99   Ht 5\' 6"  (1.676 m)   Wt 155 lb (70.3 kg)   SpO2 96%   BMI 25.02 kg/m    Physical Exam:  General- No distress,  A&Ox3, frail female using walker ENT: No sinus tenderness, TM clear, pale nasal mucosa, no oral exudate,+ post nasal drip, no LAN Cardiac: S1, S2, regular rate and rhythm, no murmur Chest: Few expiratory  wheeze/ No rales/ dullness; no accessory muscle use, no nasal flaring, no sternal retractions Abd.: Soft Non-tender, flat Ext: No clubbing + cyanosis to feet bilaterally, 1+ edema lower extremities, Cap Refill> 3 seconds, but + refill noted. Neuro:  Deconditioned at  baseline Skin: No rashes, warm and dry, frail and thin Psych: normal mood and behavior   Assessment/Plan  COPD with acute exacerbation ([place]) Resolved after hospitalization 2/1-10/17/2016 Plan: Finish prednisone taper Continue Nystain until prednisone if finished. Continue 3 L [place] as  you have been doing. Continue your Symbicort and Incruse as you have been doing. Remember to rinse mouth after use. Take Zyrtec daily as you have been dong. Continue Protonix for now. We will renew your hydromet cough medication for night time. It will make you sleepy. Continue Delsym during the day. Avoid mint, menthol,and chocolate. We will give you a copy of the GERD diet. Follow up with Dr. Lamonte Sakai at first available. If cough is still an issue, Dr. Lamonte Sakai may schedule a CT Chest at that time. Decrease albuterol treatments to twice daily. Call if tremors continue after prednisone taper is complete, so we can refer you to a neurologist. Please contact office for sooner follow up if symptoms do not improve or worsen or seek emergency care      Thrush Noted while inpatient during COPD exacerbation Discharged with Nystatin, which she is no longer taking Still noted upon oral exam today Plan: Continue Nystatin until prednisone taper is complete. Remember to rinse mouth after use of inhalers.  Edema Lower extremity edema with dusky feet Cap Refill > 3 seconds, but ++ refill noted Feet cool to touch No pain or cramping per patient Plan: Lower extremity venous doppler studies asap  Chronic Respiratory Failure with hypoxemia Continue wearing oxygen at 3 L [place]  Magdalen Spatz, NP 11/13/2016  3:21 PM

## 2016-11-13 NOTE — Assessment & Plan Note (Signed)
Noted while inpatient during COPD exacerbation Discharged with Nystatin, which she is no longer taking Still noted upon oral exam today Plan: Continue Nystatin until prednisone taper is complete. Remember to rinse mouth after use of inhalers.

## 2016-11-13 NOTE — Assessment & Plan Note (Signed)
Resolved after hospitalization 2/1-10/17/2016 Plan: Finish prednisone taper Continue Nystain until prednisone if finished. Continue 3 L [place] as you have been doing. Continue your Symbicort and Incruse as you have been doing. Remember to rinse mouth after use. Take Zyrtec daily as you have been dong. Continue Protonix for now. We will renew your hydromet cough medication for night time. It will make you sleepy. Continue Delsym during the day. Avoid mint, menthol,and chocolate. We will give you a copy of the GERD diet. Follow up with Dr. Lamonte Sakai at first available. If cough is still an issue, Dr. Lamonte Sakai may schedule a CT Chest at that time. Decrease albuterol treatments to twice daily. Call if tremors continue after prednisone taper is complete, so we can refer you to a neurologist. Please contact office for sooner follow up if symptoms do not improve or worsen or seek emergency care

## 2016-11-13 NOTE — Assessment & Plan Note (Signed)
Lower extremity edema with dusky feet Cap Refill > 3 seconds, but ++ refill noted Feet cool to touch No pain or cramping per patient Plan: Lower extremity venous doppler studies asap

## 2016-11-14 ENCOUNTER — Telehealth: Payer: Self-pay | Admitting: Acute Care

## 2016-11-15 ENCOUNTER — Ambulatory Visit (HOSPITAL_COMMUNITY): Payer: Medicare Other

## 2016-11-15 NOTE — Telephone Encounter (Signed)
I spoke with [name] yesterday afternoon and she wanted to cancel the Venous Scan schedule for this morning and I did have it cancelled. She also wanted to know if [name] did have poor circulation what could be done about it after the Scan was performed

## 2016-11-15 NOTE — Telephone Encounter (Signed)
Treatment would be based on what was found in the doppler study, so it varies.

## 2016-11-18 NOTE — Telephone Encounter (Signed)
Spoke with [name]. She is aware of SG's response. Nothing further was needed.

## 2016-12-11 ENCOUNTER — Telehealth: Payer: Self-pay | Admitting: Emergency Medicine

## 2016-12-11 NOTE — Telephone Encounter (Signed)
Pharm says that pt is taking two tabs instead of one and needs a script that states accordingly.[name]

## 2016-12-11 NOTE — Telephone Encounter (Signed)
Pt prescribed lasix 40mg  #30 with 5 refills on 10/09/16. Sig states take 1 tab daily prn for fluid or edema. Receive call from pharmacy stating, pt states she is taking 2 tabs daily. Trey with [employer] states a new Rx will be needed if RB wishes for pt to continue at this dose. I have spoken with pt to confirm lasix dosage. Pt states "someone" told her to take an extra fluid pill when she has swelling. Pt states she her feet and ankles have been swelling for a few weeks now. Pt couldn't remember who told her to increase dosage.  RB please advise. Thanks.

## 2016-12-12 NOTE — Telephone Encounter (Signed)
I don't think I ordered this. If we are going to increase the lasix to 80mg  daily then she will need periodic labwork, etc. I don't want to change it - from my standpoint would leave her at 40mg  daily. She needs to talk to her primary MD to insure that they don't want her to do differently.

## 2016-12-12 NOTE — Progress Notes (Signed)
[place] OFFICE PROGRESS NOTE DATE OF SERVICE: 12/19/2016   Victoria Nakayama, MD [place] [address]  DIAGNOSIS: Iron deficiency anemia due to chronic blood loss - Plan: CBC with Differential, Comprehensive metabolic panel, Ferritin, Iron and TIBC  Chief complaint: Follow-up anemia  CURRENT THERAPY: Feraheme 1020 mg IV prn, average every 2-3 months.  Currently we are checking CBC and ferritin every month (at her PCP's office). Patient received last Feraheme in 11/2015.  INTERVAL HISTORY:  [name] 83 y.o. female with a a history of anemia secondary to iron deficiency and gastrointestinal bleeding from colonic arteriovenous malformations is here for follow-up.  She was last seen by me 11 months ago.   She has been to the hospital 10/17/16 for Hypoxia and COPD exacerbation. She has compression fractures in  t12 and l2 and she has persistent back pain. She was diagnosed with bladder cancer, s/p surgical resection and radiation at [facility] in the fall of 2017. She was felt not to be a candidate for chemotherapy. Her daughter reported she has lost around 25 pounds.   MEDICAL HISTORY: Past Medical History:  Diagnosis Date  . Allergic rhinitis   . Anemia 04/23/2012   "severe @ times; think caused by colonic AVMs"  . Anginal pain ([place])    "pressure"  . Arthritis    "probably"  . Asthma   . Breast cancer ([place])   . COPD (chronic obstructive pulmonary disease) ([place])   . Coronary atherosclerosis of native coronary artery    Cath 1/11: RCA 50-60 ostial o/w normal. EF 70%.   . Dyspnea 04/23/2012   "all the time"  . Edema   . Emphysema   . Emphysema   . Gastric AVM   . H/O hiatal hernia   . Heart murmur   . History of blood transfusion    "many times"  . Hypertension   . Hypertr obst cardiomyop    Echo 10/10 EF 65-70% SW 1.6 PW 1.4 mild SAM no LVOT gradient at rest. Valsalva not performed Mild MR. No hyperenhance ment on MRI EF 69%.  .  Hypothyroidism   . Occlusion and stenosis of carotid artery    s/p L CEA u/s 3/11. R 60-79%; L 40-59%. no change since 3/10  . Oxygen dependent 04/23/2012   "concentrated at night; liquid during day"  . Peripheral vascular disease, unspecified   . Pneumonia   . Pulmonary hypertension    Cath 1/11 RA 8, RV 43/6/14, PA 42/17 (29) PCWP 15 Fick  5.0/2.6. PVR 2.8    . Stroke [facility]) ~ 2004   denies residual on 04/23/2012    INTERIM HISTORY: has HYPOTHYROIDISM; HYPERTENSION; CAD, NATIVE VESSEL; PULMONARY HYPERTENSION, SECONDARY; Hypertrophic obstructive cardiomyopathy(425.11); CAROTID ARTERY DISEASE; C V A / STROKE; PVD; Allergic rhinitis; EMPHYSEMA; ASTHMA; COPD (chronic obstructive pulmonary disease) ([place]); Edema; DYSPNEA; ABNORMAL ECHOCARDIOGRAM; ABNORMAL EKG; BREAST CANCER, HX OF; HYPOTHYROIDISM; GERD (gastroesophageal reflux disease); Exertional angina ([place]); Chest pain on exertion; CAD (coronary artery disease); Microcytic anemia; Angiodysplasia of intestine with hemorrhage; Nonspecific abnormal finding in stool contents; GI bleed; Acute blood loss anemia; Fever; Sepsis ([place]); Acute renal failure ([place]); Normocytic anemia; Elevated brain natriuretic peptide (BNP) level; Diastolic CHF, acute on chronic ([place]); Iron deficiency anemia due to chronic blood loss; Symptomatic anemia; COPD with acute exacerbation ([place]); Multiple pulmonary nodules; Acute on chronic respiratory failure with hypoxia ([place]); Bruising; Cough; and Thrush on her problem list.    ALLERGIES:  is allergic to latex.  MEDICATIONS:  has a current medication list which includes the following prescription(s): albuterol, atorvastatin, budesonide-formoterol, calcium carbonate, cetirizine, cholecalciferol, dextromethorphan, escitalopram, fluticasone, furosemide, levothyroxine, multivitamin with minerals, polyethylene glycol, senna-docusate, tolterodine, trazodone, umeclidinium bromide, and voltaren.  SURGICAL HISTORY:  Past Surgical History:   Procedure Laterality Date  . APPENDECTOMY  1950  . BREAST LUMPECTOMY  2002   right-hx of radiation  . CARDIAC CATHETERIZATION    . CAROTID ENDARTERECTOMY  2005   left  . CATARACT EXTRACTION W/ INTRAOCULAR LENS IMPLANT  02/2011   right  . COLONOSCOPY N/A 01/12/2014   Procedure: COLONOSCOPY;  Surgeon: Gatha Mayer, MD;  Location: [facility];  Service: Endoscopy;  Laterality: N/A;  MAC if available  . DILATION AND CURETTAGE OF UTERUS  1950's  . EXCISIONAL HEMORRHOIDECTOMY  1950's  . THYROIDECTOMY  2007   PROBLEM LIST:  1. Anemia secondary to iron deficiency and gastrointestinal bleeding from colonic arteriovenous malformations. The patient received IV INFeD 1025 mg on 04/24/2012 when she was hospitalized. She has also received Feraheme 1020 mg IV on 07/17/2012, 08/21/2012 and 12/07/2012.  2. Chronic obstructive pulmonary disease (COPD) diagnosed in 1999, currently on home oxygen at 3 L/minute at rest and during sleep. The patient uses 4 L per minute with exertion. She has been on home oxygen for approximately the past 5 years.  3. Pulmonary hypertension.  4. Right frontal arteriovenous malformation (AVM).  5. Coronary artery disease.  6. Diastolic dysfunction.  7. Past history of multiple strokes.  8. History of left carotid stenosis with left carotid endarterectomy  in 2006 at [facility].  9. Hypertension.  10. Dyslipidemia.  11. Ductal carcinoma in situ involving the right breast, status post  lumpectomy, radiation, and 5 years of tamoxifen.  12. Peripheral vascular disease.  13. Hiatal hernia.  14. Status post thyroidectomy in 2009 or 2010.  15. Osteoporosis.  16. Stroke involving right eye in April 2011, now with vision restored.  17. Diverticulosis.  18. Hearing impairment.  19. Systolic ejection murmur.  20. Hoarseness noted around December 2013. This was evaluated by an ENT specialist.  21. Bladder cancer , s/p surgical resection and radiation at [facility] on 05/2016, not  a candidate for chemo  REVIEW OF SYSTEMS:   Constitutional: Denies fevers, chills or abnormal weight loss Eyes: Denies blurriness of vision Ears, nose, mouth, throat, and face: Denies mucositis or sore throat Respiratory: Denies cough, dyspnea or wheezes Cardiovascular: Denies palpitation, chest discomfort (+) lower extremity swelling Gastrointestinal:  Denies nausea, heartburn or change in bowel habits Skin: Denies abnormal skin rashes Lymphatics: Denies new lymphadenopathy or easy bruising Neurological:Denies numbness, tingling or new weaknesses Behavioral/Psych: Mood is stable, no new changes  All other systems were reviewed with the patient and are negative.  PHYSICAL EXAMINATION:  ECOG PERFORMANCE STATUS: 3  Blood pressure (!) 130/56, pulse 84, temperature 97.9 F (36.6 C), temperature source Oral, resp. rate 18, height _0  (1.676 m), SpO2 100 %.  GENERAL:alert, no distress and comfortable; elderly female who appears her stated age.  SKIN: skin color, texture, turgor are normal, no rashes or significant lesions EYES: normal, Conjunctiva are pink and non-injected, sclera clear OROPHARYNX:no exudate, no erythema and lips, buccal mucosa, and tongue normal  NECK: supple, thyroid normal size, non-tender, without nodularity LYMPH:  no palpable lymphadenopathy in the cervical, axillary or supraclavicular LUNGS: clear to auscultation and percussion with normal breathing effort HEART: regular rate & rhythm and no murmurs and no lower extremity edema ABDOMEN:abdomen soft, non-tender and normal bowel sounds Musculoskeletal:no  cyanosis of digits and no clubbing  NEURO: alert & oriented x 3 with fluent speech, no focal motor/sensory deficits  LABORATORY DATA: CBC Latest Ref Rng & Units 12/19/2016 10/25/2016 10/17/2016  WBC 3.9 - 10.3 10e3/uL 6.5 12.7(H) 8.8  Hemoglobin 11.6 - 15.9 g/dL 13.0 12.8 12.4  Hematocrit 34.8 - 46.6 % 39.8 41.5 41.6  Platelets 145 - 400 10e3/uL 233 264 291    CMP  Latest Ref Rng & Units 12/19/2016 10/25/2016 10/24/2016  Glucose 70 - 140 mg/dl 156(H) 209(H) 192(H)  BUN 7.0 - 26.0 mg/dL 15.8 26(H) 27(H)  Creatinine 0.6 - 1.1 mg/dL 0.8 0.56 0.65  Sodium 136 - 145 mEq/L 142 140 138  Potassium 3.5 - 5.1 mEq/L 3.8 4.3 4.0  Chloride 101 - 111 mmol/L - 95(L) 95(L)  CO2 22 - 29 mEq/L 33(H) 39(H) 35(H)  Calcium 8.4 - 10.4 mg/dL 9.8 7.8(L) 7.8(L)  Total Protein 6.4 - 8.3 g/dL 6.7 - -  Total Bilirubin 0.20 - 1.20 mg/dL 0.42 - -  Alkaline Phos 40 - 150 U/L 96 - -  AST 5 - 34 U/L 23 - -  ALT 0 - 55 U/L 11 - -    RADIOGRAPHIC STUDIES: No results found.  ASSESSMENT: [name] 83 y.o. female with a history of Iron deficiency anemia due to chronic blood loss - Plan: CBC with Differential, Comprehensive metabolic panel, Ferritin, Iron and TIBC   PLAN:  1. Anemia secondary to iron deficiency and GI bleeding.  -She had multiple GI workup before, which showed AVM. She does not follow up with GI anymore -She responded to IV iron very well. - She has been receiving IV Feraheme every 2-3 months. Last infusion was in 10/2016 when she was hospitalized for COPD exacerbation -Her CBC is showed normal hemoglobin. Her ferritin level is still pending today. We'll proceed IV Feraheme infusion today -I'll continue her lab with CBC and Feraheme monthly at her PCP's office, the result will be faxed to me and I ask pt to call me after her lab test -IV Feraheme if ferritin less than 100 -I May cancel the next one if ferritin is above 100 - She will continue getting labs at PCP. I have advised her to call if she notices her results are low - Continue monitoring   2. Fatigue  -likely related to iron deficiency, which will likely improve quickly after IV Feraheme -She'll also follow-up with her primary care physician  3. HTN -Her blood pressure has previously been borderline low, her hypertension medication has been held by her primary care physician -She previously followed  up with  Dr. Mancel Bale  4. Swelling - I advised the patient to try to elevate feet and walk around to get blood circulating. I also advised her to not stand for too long because the blood will accumulate   5. Back pain from compression fracture  - Patient has been experiencing back pain potentially due to her compression fractures - I have recommended the patient try Tylenol to help -she will follow up with her orthopedics   6. Bladder cancer -She is status post surgical resection and radiation at [facility], not a candidate for chemotherapy -She will continue follow-up at the [facility] reviewed. Will proceed with iv feraheme  - f/u with lab in 6 months -monthly lab at Dr. Latrelle Dodrill office. Pt will call if ferritin is low -if her feritin>100 today, will cancel her next week infusion  All questions were answered.  The patient knows to call the clinic with any problems, questions or concerns. We can certainly see the patient much sooner if necessary.  I spent 20 minutes counseling the patient face to face. The total time spent in the appointment was 25 minutes.  This document serves as a record of services personally performed by Truitt Merle, MD. It was created on her behalf by Brandt Loosen, a trained medical scribe. The creation of this record is based on the scribe's personal observations and the provider's statements to them. This document has been checked and approved by the attending provider.   Truitt Merle  12/19/2016

## 2016-12-12 NOTE — Telephone Encounter (Signed)
Upon inspection of patient's chart, it looks like the Rx for the lasix followed patient after discharge 06/2016 where Brandi did the patient instructions and discharge prescriptions.    ATC pt to discuss RB's response - line rang numerous times with no answer and no option to LM.  WCB.

## 2016-12-16 NOTE — Telephone Encounter (Signed)
Pt aware of RB's recommendations, and voiced her understanding. Pt states she will contact her her PCP. Nothing further needed.

## 2016-12-19 ENCOUNTER — Ambulatory Visit (HOSPITAL_BASED_OUTPATIENT_CLINIC_OR_DEPARTMENT_OTHER): Payer: Medicare Other

## 2016-12-19 ENCOUNTER — Encounter: Payer: Self-pay | Admitting: Hematology

## 2016-12-19 ENCOUNTER — Ambulatory Visit (HOSPITAL_BASED_OUTPATIENT_CLINIC_OR_DEPARTMENT_OTHER): Payer: Medicare Other | Admitting: Hematology

## 2016-12-19 ENCOUNTER — Other Ambulatory Visit (HOSPITAL_BASED_OUTPATIENT_CLINIC_OR_DEPARTMENT_OTHER): Payer: Medicare Other

## 2016-12-19 VITALS — BP 130/56 | HR 84 | Temp 97.9°F | Resp 18 | Ht 66.0 in

## 2016-12-19 DIAGNOSIS — D509 Iron deficiency anemia, unspecified: Secondary | ICD-10-CM | POA: Diagnosis present

## 2016-12-19 DIAGNOSIS — D500 Iron deficiency anemia secondary to blood loss (chronic): Secondary | ICD-10-CM

## 2016-12-19 DIAGNOSIS — M545 Low back pain: Secondary | ICD-10-CM | POA: Diagnosis not present

## 2016-12-19 DIAGNOSIS — Q2733 Arteriovenous malformation of digestive system vessel: Secondary | ICD-10-CM

## 2016-12-19 DIAGNOSIS — K5521 Angiodysplasia of colon with hemorrhage: Secondary | ICD-10-CM

## 2016-12-19 DIAGNOSIS — I10 Essential (primary) hypertension: Secondary | ICD-10-CM | POA: Diagnosis not present

## 2016-12-19 DIAGNOSIS — R5383 Other fatigue: Secondary | ICD-10-CM

## 2016-12-19 LAB — COMPREHENSIVE METABOLIC PANEL
ALBUMIN: 3.6 g/dL (ref 3.5–5.0)
ALK PHOS: 96 U/L (ref 40–150)
ALT: 11 U/L (ref 0–55)
ANION GAP: 10 meq/L (ref 3–11)
AST: 23 U/L (ref 5–34)
BILIRUBIN TOTAL: 0.42 mg/dL (ref 0.20–1.20)
BUN: 15.8 mg/dL (ref 7.0–26.0)
CALCIUM: 9.8 mg/dL (ref 8.4–10.4)
CO2: 33 meq/L — AB (ref 22–29)
CREATININE: 0.8 mg/dL (ref 0.6–1.1)
Chloride: 99 mEq/L (ref 98–109)
EGFR: 70 mL/min/{1.73_m2} — ABNORMAL LOW (ref >90)
GLUCOSE: 156 mg/dL — AB (ref 70–140)
Potassium: 3.8 mEq/L (ref 3.5–5.1)
Sodium: 142 mEq/L (ref 136–145)
Total Protein: 6.7 g/dL (ref 6.4–8.3)

## 2016-12-19 LAB — CBC WITH DIFFERENTIAL/PLATELET
BASO%: 1.2 % (ref 0.0–2.0)
BASOS ABS: 0.1 10*3/uL (ref 0.0–0.1)
EOS ABS: 0.3 10*3/uL (ref 0.0–0.5)
EOS%: 4.2 % (ref 0.0–7.0)
HCT: 39.8 % (ref 34.8–46.6)
HGB: 13 g/dL (ref 11.6–15.9)
LYMPH#: 1.1 10*3/uL (ref 0.9–3.3)
LYMPH%: 16.8 % (ref 14.0–49.7)
MCH: 31.7 pg (ref 25.1–34.0)
MCHC: 32.8 g/dL (ref 31.5–36.0)
MCV: 96.7 fL (ref 79.5–101.0)
MONO#: 0.8 10*3/uL (ref 0.1–0.9)
MONO%: 12 % (ref 0.0–14.0)
NEUT%: 65.8 % (ref 38.4–76.8)
NEUTROS ABS: 4.3 10*3/uL (ref 1.5–6.5)
Platelets: 233 10*3/uL (ref 145–400)
RBC: 4.12 10*6/uL (ref 3.70–5.45)
RDW: 16 % — AB (ref 11.2–14.5)
WBC: 6.5 10*3/uL (ref 3.9–10.3)

## 2016-12-19 LAB — IRON AND TIBC
%SAT: 22 % (ref 21–57)
Iron: 72 ug/dL (ref 41–142)
TIBC: 331 ug/dL (ref 236–444)
UIBC: 260 ug/dL (ref 120–384)

## 2016-12-19 LAB — FERRITIN: Ferritin: 117 ng/ml (ref 9–269)

## 2016-12-19 MED ORDER — SODIUM CHLORIDE 0.9 % IV SOLN
INTRAVENOUS | Status: DC
  Administered 2016-12-19: 12:00:00 via INTRAVENOUS

## 2016-12-19 MED ORDER — SODIUM CHLORIDE 0.9 % IV SOLN
510.0000 mg | INTRAVENOUS | Status: DC
  Administered 2016-12-19: 510 mg via INTRAVENOUS
  Filled 2016-12-19: qty 17

## 2016-12-19 NOTE — Patient Instructions (Signed)

## 2016-12-21 ENCOUNTER — Telehealth: Payer: Self-pay | Admitting: Hematology

## 2016-12-21 NOTE — Telephone Encounter (Signed)
Cancelled appt per sch message 12/19/2016 los from Dr. Burr Medico. - patient is aware appt was cancelled.

## 2016-12-25 ENCOUNTER — Other Ambulatory Visit: Payer: Self-pay | Admitting: Adult Health

## 2016-12-26 ENCOUNTER — Ambulatory Visit: Payer: Medicare Other

## 2016-12-27 ENCOUNTER — Telehealth: Payer: Self-pay | Admitting: Hematology

## 2016-12-27 NOTE — Telephone Encounter (Signed)
Lvm advising appt 10/4 @ 10am. Also mailed calendar.

## 2016-12-30 ENCOUNTER — Other Ambulatory Visit: Payer: Self-pay | Admitting: Specialist

## 2016-12-30 DIAGNOSIS — S32020A Wedge compression fracture of second lumbar vertebra, initial encounter for closed fracture: Secondary | ICD-10-CM

## 2016-12-30 DIAGNOSIS — S22080A Wedge compression fracture of T11-T12 vertebra, initial encounter for closed fracture: Secondary | ICD-10-CM

## 2017-01-02 ENCOUNTER — Other Ambulatory Visit: Payer: Self-pay | Admitting: Orthopedic Surgery

## 2017-01-02 DIAGNOSIS — S32020D Wedge compression fracture of second lumbar vertebra, subsequent encounter for fracture with routine healing: Secondary | ICD-10-CM

## 2017-01-07 ENCOUNTER — Other Ambulatory Visit: Payer: Self-pay | Admitting: Orthopedic Surgery

## 2017-01-07 ENCOUNTER — Ambulatory Visit: Payer: Medicare Other | Admitting: Emergency Medicine

## 2017-01-07 ENCOUNTER — Other Ambulatory Visit: Payer: Medicare Other

## 2017-01-07 ENCOUNTER — Ambulatory Visit
Admission: RE | Admit: 2017-01-07 | Discharge: 2017-01-07 | Disposition: A | Discharge status: Home / Self Care | Payer: Medicare Other | Source: Ambulatory Visit | Attending: Orthopedic Surgery | Admitting: Orthopedic Surgery

## 2017-01-07 DIAGNOSIS — S32020D Wedge compression fracture of second lumbar vertebra, subsequent encounter for fracture with routine healing: Secondary | ICD-10-CM

## 2017-01-16 ENCOUNTER — Ambulatory Visit (HOSPITAL_COMMUNITY)
Admission: RE | Admit: 2017-01-16 | Discharge: 2017-01-16 | Disposition: A | Discharge status: Home / Self Care | Payer: Medicare Other | Source: Ambulatory Visit | Attending: Specialist | Admitting: Specialist

## 2017-01-16 ENCOUNTER — Ambulatory Visit (HOSPITAL_COMMUNITY): Payer: Medicare Other

## 2017-01-16 DIAGNOSIS — X58XXXD Exposure to other specified factors, subsequent encounter: Secondary | ICD-10-CM | POA: Diagnosis not present

## 2017-01-16 DIAGNOSIS — S22080A Wedge compression fracture of T11-T12 vertebra, initial encounter for closed fracture: Secondary | ICD-10-CM

## 2017-01-16 DIAGNOSIS — S22080D Wedge compression fracture of T11-T12 vertebra, subsequent encounter for fracture with routine healing: Secondary | ICD-10-CM | POA: Insufficient documentation

## 2017-01-16 DIAGNOSIS — S32020A Wedge compression fracture of second lumbar vertebra, initial encounter for closed fracture: Secondary | ICD-10-CM

## 2017-01-16 DIAGNOSIS — M4804 Spinal stenosis, thoracic region: Secondary | ICD-10-CM | POA: Diagnosis not present

## 2017-01-16 MED ORDER — GADOBENATE DIMEGLUMINE 529 MG/ML IV SOLN
15.0000 mL | Freq: Once | INTRAVENOUS | Status: AC | PRN
  Administered 2017-01-16: 14 mL via INTRAVENOUS

## 2017-01-17 ENCOUNTER — Ambulatory Visit
Admission: RE | Admit: 2017-01-17 | Discharge: 2017-01-17 | Disposition: A | Discharge status: Home / Self Care | Payer: Medicare Other | Source: Ambulatory Visit | Attending: Orthopedic Surgery | Admitting: Orthopedic Surgery

## 2017-01-17 ENCOUNTER — Ambulatory Visit (HOSPITAL_COMMUNITY): Payer: Medicare Other

## 2017-01-17 ENCOUNTER — Other Ambulatory Visit (HOSPITAL_COMMUNITY): Payer: Medicare Other

## 2017-01-17 ENCOUNTER — Other Ambulatory Visit (HOSPITAL_COMMUNITY): Payer: Self-pay | Admitting: Interventional Radiology

## 2017-01-17 DIAGNOSIS — S32020D Wedge compression fracture of second lumbar vertebra, subsequent encounter for fracture with routine healing: Secondary | ICD-10-CM

## 2017-01-17 DIAGNOSIS — M4850XA Collapsed vertebra, not elsewhere classified, site unspecified, initial encounter for fracture: Principal | ICD-10-CM

## 2017-01-17 DIAGNOSIS — IMO0001 Reserved for inherently not codable concepts without codable children: Secondary | ICD-10-CM

## 2017-01-20 ENCOUNTER — Other Ambulatory Visit: Payer: Self-pay | Admitting: Radiology

## 2017-01-20 ENCOUNTER — Other Ambulatory Visit: Payer: Medicare Other

## 2017-01-21 ENCOUNTER — Other Ambulatory Visit (HOSPITAL_COMMUNITY): Payer: Self-pay | Admitting: Interventional Radiology

## 2017-01-21 ENCOUNTER — Ambulatory Visit (HOSPITAL_COMMUNITY)
Admission: RE | Admit: 2017-01-21 | Discharge: 2017-01-21 | Disposition: A | Discharge status: Home / Self Care | Payer: Medicare Other | Source: Ambulatory Visit | Attending: Interventional Radiology | Admitting: Interventional Radiology

## 2017-01-21 ENCOUNTER — Encounter (HOSPITAL_COMMUNITY): Payer: Self-pay

## 2017-01-21 DIAGNOSIS — Z8673 Personal history of transient ischemic attack (TIA), and cerebral infarction without residual deficits: Secondary | ICD-10-CM | POA: Diagnosis not present

## 2017-01-21 DIAGNOSIS — Z87891 Personal history of nicotine dependence: Secondary | ICD-10-CM | POA: Insufficient documentation

## 2017-01-21 DIAGNOSIS — K5730 Diverticulosis of large intestine without perforation or abscess without bleeding: Secondary | ICD-10-CM | POA: Insufficient documentation

## 2017-01-21 DIAGNOSIS — Z8551 Personal history of malignant neoplasm of bladder: Secondary | ICD-10-CM | POA: Diagnosis not present

## 2017-01-21 DIAGNOSIS — Z853 Personal history of malignant neoplasm of breast: Secondary | ICD-10-CM | POA: Diagnosis not present

## 2017-01-21 DIAGNOSIS — I110 Hypertensive heart disease with heart failure: Secondary | ICD-10-CM | POA: Diagnosis not present

## 2017-01-21 DIAGNOSIS — I739 Peripheral vascular disease, unspecified: Secondary | ICD-10-CM | POA: Insufficient documentation

## 2017-01-21 DIAGNOSIS — I272 Pulmonary hypertension, unspecified: Secondary | ICD-10-CM | POA: Diagnosis not present

## 2017-01-21 DIAGNOSIS — E039 Hypothyroidism, unspecified: Secondary | ICD-10-CM | POA: Insufficient documentation

## 2017-01-21 DIAGNOSIS — M4856XA Collapsed vertebra, not elsewhere classified, lumbar region, initial encounter for fracture: Secondary | ICD-10-CM | POA: Diagnosis not present

## 2017-01-21 DIAGNOSIS — I6523 Occlusion and stenosis of bilateral carotid arteries: Secondary | ICD-10-CM | POA: Insufficient documentation

## 2017-01-21 DIAGNOSIS — Z9981 Dependence on supplemental oxygen: Secondary | ICD-10-CM | POA: Insufficient documentation

## 2017-01-21 DIAGNOSIS — J449 Chronic obstructive pulmonary disease, unspecified: Secondary | ICD-10-CM | POA: Diagnosis not present

## 2017-01-21 DIAGNOSIS — IMO0001 Reserved for inherently not codable concepts without codable children: Secondary | ICD-10-CM

## 2017-01-21 DIAGNOSIS — I2510 Atherosclerotic heart disease of native coronary artery without angina pectoris: Secondary | ICD-10-CM | POA: Insufficient documentation

## 2017-01-21 DIAGNOSIS — I509 Heart failure, unspecified: Secondary | ICD-10-CM | POA: Insufficient documentation

## 2017-01-21 DIAGNOSIS — M4850XA Collapsed vertebra, not elsewhere classified, site unspecified, initial encounter for fracture: Principal | ICD-10-CM

## 2017-01-21 DIAGNOSIS — M4804 Spinal stenosis, thoracic region: Secondary | ICD-10-CM | POA: Insufficient documentation

## 2017-01-21 DIAGNOSIS — M4854XA Collapsed vertebra, not elsewhere classified, thoracic region, initial encounter for fracture: Secondary | ICD-10-CM | POA: Insufficient documentation

## 2017-01-21 HISTORY — PX: IR VERTEBROPLASTY CERV/THOR BX INC UNI/BIL INC/INJECT/IMAGING: IMG5515

## 2017-01-21 LAB — CBC
HEMATOCRIT: 42.1 % (ref 36.0–46.0)
Hemoglobin: 13 g/dL (ref 12.0–15.0)
MCH: 30.8 pg (ref 26.0–34.0)
MCHC: 30.9 g/dL (ref 30.0–36.0)
MCV: 99.8 fL (ref 78.0–100.0)
PLATELETS: 196 10*3/uL (ref 150–400)
RBC: 4.22 MIL/uL (ref 3.87–5.11)
RDW: 13.3 % (ref 11.5–15.5)
WBC: 8.7 10*3/uL (ref 4.0–10.5)

## 2017-01-21 LAB — BASIC METABOLIC PANEL WITH GFR
Anion gap: 8 (ref 5–15)
BUN: 11 mg/dL (ref 6–20)
CO2: 35 mmol/L — ABNORMAL HIGH (ref 22–32)
Calcium: 9 mg/dL (ref 8.9–10.3)
Chloride: 94 mmol/L — ABNORMAL LOW (ref 101–111)
Creatinine, Ser: 0.69 mg/dL (ref 0.44–1.00)
GFR calc Af Amer: >60 mL/min
GFR calc non Af Amer: >60 mL/min
Glucose, Bld: 113 mg/dL — ABNORMAL HIGH (ref 65–99)
Potassium: 3.6 mmol/L (ref 3.5–5.1)
Sodium: 137 mmol/L (ref 135–145)

## 2017-01-21 LAB — PROTIME-INR
INR: 0.96
PROTHROMBIN TIME: 12.7 s (ref 11.4–15.2)

## 2017-01-21 LAB — APTT: APTT: 28 s (ref 24–36)

## 2017-01-21 MED ORDER — IOPAMIDOL (ISOVUE-300) INJECTION 61%
INTRAVENOUS | Status: AC | PRN
  Administered 2017-01-21: 10 mL

## 2017-01-21 MED ORDER — IOPAMIDOL (ISOVUE-300) INJECTION 61%
INTRAVENOUS | Status: AC
  Filled 2017-01-21: qty 50

## 2017-01-21 MED ORDER — FENTANYL CITRATE (PF) 100 MCG/2ML IJ SOLN
INTRAMUSCULAR | Status: AC | PRN
  Administered 2017-01-21 (×3): 25 ug via INTRAVENOUS

## 2017-01-21 MED ORDER — HYDROMORPHONE HCL 1 MG/ML IJ SOLN
INTRAMUSCULAR | Status: DC
Start: 2017-01-21 — End: 2017-01-21
  Filled 2017-01-21: qty 1

## 2017-01-21 MED ORDER — HYDROMORPHONE HCL 1 MG/ML IJ SOLN
INTRAMUSCULAR | Status: AC
  Filled 2017-01-21: qty 1

## 2017-01-21 MED ORDER — GELATIN ABSORBABLE 12-7 MM EX MISC
CUTANEOUS | Status: AC
  Filled 2017-01-21: qty 1

## 2017-01-21 MED ORDER — FENTANYL CITRATE (PF) 100 MCG/2ML IJ SOLN
INTRAMUSCULAR | Status: AC
  Filled 2017-01-21: qty 4

## 2017-01-21 MED ORDER — SODIUM CHLORIDE 0.9 % IV SOLN: INTRAVENOUS | Status: AC

## 2017-01-21 MED ORDER — CEFAZOLIN SODIUM-DEXTROSE 2-4 GM/100ML-% IV SOLN
INTRAVENOUS | Status: AC
Start: 2017-01-21 — End: 2017-01-21
  Filled 2017-01-21: qty 100

## 2017-01-21 MED ORDER — MIDAZOLAM HCL 2 MG/2ML IJ SOLN
INTRAMUSCULAR | Status: AC
  Filled 2017-01-21: qty 4

## 2017-01-21 MED ORDER — SODIUM CHLORIDE 0.9 % IV SOLN
INTRAVENOUS | Status: AC | PRN
  Administered 2017-01-21: 10 mL/h via INTRAVENOUS

## 2017-01-21 MED ORDER — BUPIVACAINE HCL (PF) 0.25 % IJ SOLN
INTRAMUSCULAR | Status: AC
  Administered 2017-01-21: 15 mL
  Filled 2017-01-21: qty 30

## 2017-01-21 MED ORDER — FLUMAZENIL 0.5 MG/5ML IV SOLN
0.2000 mg | Freq: Once | INTRAVENOUS | Status: AC
  Administered 2017-01-21: 0.2 mg via INTRAVENOUS

## 2017-01-21 MED ORDER — CEFAZOLIN SODIUM-DEXTROSE 2-4 GM/100ML-% IV SOLN
2.0000 g | INTRAVENOUS | Status: AC
  Administered 2017-01-21: 2 g via INTRAVENOUS

## 2017-01-21 MED ORDER — HYDROMORPHONE HCL 1 MG/ML IJ SOLN
INTRAMUSCULAR | Status: AC | PRN
  Administered 2017-01-21: 1 mg via INTRAVENOUS

## 2017-01-21 MED ORDER — MIDAZOLAM HCL 2 MG/2ML IJ SOLN
INTRAMUSCULAR | Status: AC | PRN
  Administered 2017-01-21 (×3): 1 mg via INTRAVENOUS

## 2017-01-21 MED ORDER — FLUMAZENIL 0.5 MG/5ML IV SOLN
INTRAVENOUS | Status: AC
  Administered 2017-01-21: 0.2 mg via INTRAVENOUS
  Filled 2017-01-21: qty 5

## 2017-01-21 MED ORDER — TOBRAMYCIN SULFATE 1.2 G IJ SOLR
INTRAMUSCULAR | Status: AC
  Filled 2017-01-21: qty 1.2

## 2017-01-21 MED ORDER — SODIUM CHLORIDE 0.9 % IV SOLN: INTRAVENOUS | Status: DC

## 2017-01-21 NOTE — Progress Notes (Signed)
Pam Turpin,PA in to see client and client more awake and resp 10 now

## 2017-01-21 NOTE — Progress Notes (Signed)
Patient ID: [name], female   DOB: Jul 22, 1933, 83 y.o.   MRN: [id number]   Pt very groggy after IR procedure today.  Roland RN reports 02 sat of 88%  2L 02 via [place] now Romazicon 0.2 mg IV given per MD.  Pt is resting comfortably Speech is clear Answers all questions appropriately BP 103/53 02 sat 91 %  Doing well Call if need [place]

## 2017-01-21 NOTE — Progress Notes (Addendum)
Client received from radiology dept and respirations 8 and on 3l/min nasal cannula and O2 sat 84-88 and Dr Estanislado Pandy notified and order noted; client awakens to verbal stimuli

## 2017-01-21 NOTE — Procedures (Signed)
S/P T 12 vertebroplasty with biopsy

## 2017-01-21 NOTE — Progress Notes (Signed)
Client's family states client is scratching and client states is not itching; no rash noted; Dr Estanislado Pandy notified and per Dr Estanislado Pandy ok to D/C home

## 2017-01-21 NOTE — Progress Notes (Signed)
Received verbal order from Dr Maxie Better to do biopsy today during KP/VP due to history of breast Ca

## 2017-01-21 NOTE — Discharge Instructions (Addendum)
KYPHOPLASTY/VERTEBROPLASTY DISCHARGE INSTRUCTIONS  Medications: (check all that apply)     Resume all home medications as before procedure.                   Continue your pain medications as prescribed as needed.  Over the next 3-5 days, decrease your pain medication as tolerated.  Over the counter medications (i.e. Tylenol, ibuprofen, and aleve) may be substituted once severe/moderate pain symptoms have subsided.   Wound Care: - Bandages may be removed the day following your procedure.  You may get your incision wet once bandages are removed.  Bandaids may be used to cover the incisions until scab formation.  Topical ointments are optional.  - If you develop a fever greater than 101 degrees, have increased skin redness at the incision sites or pus-like oozing from incisions occurring within 1 week of the procedure, contact radiology at [phone] or [phone].  - Ice pack to back for 15-20 minutes 2-3 time per day for first 2-3 days post procedure.  The ice will expedite muscle healing and help with the pain from the incisions.   Activity: - Bedrest today with limited activity for 24 hours post procedure.  - No driving for 48 hours.  - Increase your activity as tolerated after bedrest (with assistance if necessary).  - Refrain from any strenuous activity or heavy lifting (greater than 10 lbs.).   Follow up: - Contact radiology at [phone] or [phone] if any questions/concerns.  - A physician assistant from radiology will contact you in approximately 1 week.  - If a biopsy was performed at the time of your procedure, your referring physician should receive the results in usually 2-3 days.         Percutaneous Vertebroplasty, Care After 1.No stooping ,bending of lifting more than 10 lbs for 2 weeks. 2.Use walker to ambulate for 2 weeks ,or longer as needed. 3.No driving for 2 weeks. 4 Please call Dr. Jen Mow office for biopsy results.

## 2017-01-21 NOTE — H&P (Signed)
Chief Complaint: Patient was seen in consultation today for Thoracic 12 verterbroplasty/kyphoplasty at the request of  Dr Hardie Pulley  Supervising Physician: Luanne Bras  Patient Status: [facility] - Out-pt  History of Present Illness: [name] is a 83 y.o. female   Hx Bladder Ca; COPD; IDA; Cerebral and GI AVMs  Pt fell at home in Oct 2017 Has had back pain every since Denies new injury Worsening back pain over last several weeks MRI 01/16/17: IMPRESSION: 1. 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 Retropulsion of the posterosuperior T12 endplate resulting in spinal stenosis with up to mild right hemi cord mass effect, but no associated spinal cord signal abnormality. 2. Chronic T7, L1 and L2 compression fractures. 3. 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 4. Severe diverticulosis of the distal colon with no active inflammation.  Pain meds are without relief.  Scheduled for T12 VP/KP in IR today  Past Medical History:  Diagnosis Date  . Allergic rhinitis   . Anemia 04/23/2012   "severe @ times; think caused by colonic AVMs"  . Anginal pain ([place])    "pressure"  . Arthritis    "probably"  . Asthma   . Breast cancer ([place])   . COPD (chronic obstructive pulmonary disease) ([place])   . Coronary atherosclerosis of native coronary artery    Cath 1/11: RCA 50-60 ostial o/w normal. EF 70%.   . Dyspnea 04/23/2012   "all the time"  . Edema   . Emphysema   . Emphysema   . Gastric AVM   . H/O hiatal hernia   . Heart murmur   . History of blood transfusion    "many times"  . Hypertension   . Hypertr obst cardiomyop    Echo 10/10 EF 65-70% SW 1.6 PW 1.4 mild SAM no LVOT gradient at rest. Valsalva not performed Mild MR. No hyperenhance ment on MRI EF 69%.  . Hypothyroidism   . Occlusion and stenosis of  carotid artery    s/p L CEA u/s 3/11. R 60-79%; L 40-59%. no change since 3/10  . Oxygen dependent 04/23/2012   "concentrated at night; liquid during day"  . Peripheral vascular disease, unspecified ([place])   . Pneumonia   . Pulmonary hypertension ([place])    Cath 1/11 RA 8, RV 43/6/14, PA 42/17 (29) PCWP 15 Fick  5.0/2.6. PVR 2.8    . Stroke [facility]) ~ 2004   denies residual on 04/23/2012    Past Surgical History:  Procedure Laterality Date  . APPENDECTOMY  1950  . BREAST LUMPECTOMY  2002   right-hx of radiation  . CARDIAC CATHETERIZATION    . CAROTID ENDARTERECTOMY  2005   left  . CATARACT EXTRACTION W/ INTRAOCULAR LENS IMPLANT  02/2011   right  . COLONOSCOPY N/A 01/12/2014   Procedure: COLONOSCOPY;  Surgeon: Gatha Mayer, MD;  Location: [facility] ENDOSCOPY;  Service: Endoscopy;  Laterality: N/A;  MAC if available  . DILATION AND CURETTAGE OF UTERUS  1950's  . EXCISIONAL HEMORRHOIDECTOMY  1950's  . THYROIDECTOMY  2007    Allergies: Latex  Medications: Prior to Admission medications   Medication Sig Start Date End Date Taking? Authorizing Provider  albuterol (PROVENTIL HFA;VENTOLIN HFA) 108 (90 Base) MCG/ACT inhaler Inhale 2 puffs into the lungs every 6 (six) hours as needed for wheezing or shortness of breath. 06/28/16  Yes Parrett, Tammy S,  NP  ALPRAZolam (XANAX) 0.5 MG tablet Take 1 tablet (0.5 mg total) by mouth at bedtime as needed for anxiety or sleep. 10/25/16  Yes Erick Colace, NP  atorvastatin (LIPITOR) 20 MG tablet Take 20 mg by mouth daily.     Yes [provider]  budesonide-formoterol (SYMBICORT) 160-4.5 MCG/ACT inhaler Inhale 2 puffs into the lungs 2 (two) times daily. 06/28/16  Yes Parrett, Tammy S, NP  cholecalciferol (VITAMIN D) 1000 units tablet Take 1,000 Units by mouth daily.   Yes [provider]  dextromethorphan (DELSYM) 30 MG/5ML liquid Take 30 mg by mouth at bedtime as needed for cough.    Yes [provider]  escitalopram (LEXAPRO) 20 MG  tablet Take 20 mg by mouth daily.    Yes [provider]  furosemide (LASIX) 40 MG tablet Take 40 mg by mouth daily.   Yes [provider]  guaiFENesin (MUCINEX) 600 MG 12 hr tablet Take 2 tablets (1,200 mg total) by mouth 2 (two) times daily. Patient taking differently: Take 1,200 mg by mouth 2 (two) times daily as needed for cough.  10/25/16  Yes Erick Colace, NP  HYDROcodone-acetaminophen (NORCO/VICODIN) 5-325 MG tablet Take 1 tablet by mouth every 4 (four) hours as needed for moderate pain.    Yes [provider]  INCRUSE ELLIPTA 62.5 MCG/INH AEPB INHALE 1 PUFF BY MOUTH ONCE DAILY AT BEDTIME. 12/25/16  Yes Byrum, Rose Fillers, MD  levothyroxine (SYNTHROID, LEVOTHROID) 150 MCG tablet Take 150 mcg by mouth daily before breakfast.    Yes [provider]  metoprolol tartrate (LOPRESSOR) 25 MG tablet Take 25 mg by mouth 2 (two) times daily.   Yes [provider]  Multiple Vitamin (MULTIVITAMIN WITH MINERALS) TABS tablet Take 1 tablet by mouth daily.   Yes [provider]  pantoprazole (PROTONIX) 40 MG tablet Take 1 tablet (40 mg total) by mouth 2 (two) times daily. 10/25/16  Yes Erick Colace, NP  polyethylene glycol (MIRALAX / GLYCOLAX) packet Take 17 g by mouth daily as needed for mild constipation.   Yes [provider]  tolterodine (DETROL LA) 2 MG 24 hr capsule Take 2 mg by mouth 2 (two) times daily.    Yes [provider]  traZODone (DESYREL) 100 MG tablet Take 100 mg by mouth at bedtime.  12/19/15  Yes [provider]  VOLTAREN 1 % GEL Apply 2 g topically 4 (four) times daily as needed. Apply to hand for pain 01/17/16  Yes [provider]     Family History  Problem Relation Age of Onset  . Emphysema Father   . Colon cancer Mother   . Stroke Mother   . COPD Sister   . Hypertension Sister     Pulmonary  . Emphysema Sister   . Asthma Sister   . Asthma Sister   . Emphysema Brother     x2  . Colon  cancer Brother     Social History   Social History  . Marital status: Single    Spouse name: N/A  . Number of children: N/A  . Years of education: N/A   Occupational History  . retired Retired    Physicist, medical for an apartment complex   Social History Main Topics  . Smoking status: Former Smoker    Packs/day: 1.00    Years: 30.00    Types: Cigarettes    Quit date: 09/16/1997  . Smokeless tobacco: Never Used  . Alcohol use No  .  Drug use: No  . Sexual activity: Not Currently   Other Topics Concern  . None   Social History Narrative   Single, no children   Lives with 2 sisters   Does not get regular exercise    Review of Systems: A 12 point ROS discussed and pertinent positives are indicated in the HPI above.  All other systems are negative.  Review of Systems  Constitutional: Positive for activity change, appetite change and fatigue.  Respiratory: Negative for cough and shortness of breath.   Cardiovascular: Negative for chest pain.  Gastrointestinal: Negative for abdominal pain.  Musculoskeletal: Positive for back pain and gait problem.  Neurological: Positive for weakness.  Psychiatric/Behavioral: Negative for behavioral problems and confusion.    Vital Signs: BP (!) 103/52   Pulse 70   Temp 98 F (36.7 C) (Oral)   Resp 16   Ht _0  (1.676 m)   Wt 148 lb (67.1 kg)   SpO2 97%   BMI 23.89 kg/m   Physical Exam  Constitutional: She is oriented to person, place, and time.  Cardiovascular: Normal rate and regular rhythm.   Murmur heard. Pulmonary/Chest: Effort normal and breath sounds normal. She has no wheezes.  Abdominal: Soft. Bowel sounds are normal. There is no tenderness.  Musculoskeletal: Normal range of motion.  Painful mid back area  Neurological: She is alert and oriented to person, place, and time.  Skin: Skin is warm and dry.  Psychiatric: She has a normal mood and affect. Her behavior is normal. Judgment and thought content normal.    Nursing note and vitals reviewed.   Mallampati Score:  MD Evaluation Airway: WNL Heart: WNL Abdomen: WNL Chest/ Lungs: WNL ASA  Classification: 3 Mallampati/Airway Score: Two  Imaging: Mr Thoracic Spine W Wo Contrast  Result Date: 01/16/2017 CLINICAL DATA:  83 year old female with thoracolumbar compression fractures. Being considered for vertebroplasty. EXAM: MRI THORACIC AND LUMBAR SPINE WITHOUT AND WITH CONTRAST TECHNIQUE: Multiplanar and multiecho pulse sequences of the thoracic and lumbar spine were obtained without and with intravenous contrast. CONTRAST:  31m MULTIHANCE GADOBENATE DIMEGLUMINE 529 MG/ML IV SOLN COMPARISON:  Portable chest radiographs 10/23/2016 and earlier. Thoracic spine radiographs 06/20/2009 and earlier. Cervical spine MRI 09/11/2004. FINDINGS: MRI THORACIC SPINE FINDINGS Limited sagittal imaging of the cervical spine: Cervical vertebral height and alignment probably has not significantly changed since 2005. Thoracic segmentation:  Appears to be normal. Alignment: Straightening of lower thoracic kyphosis. Mild focal kyphosis related to compression fractures at T7 and T12. Vertebrae: Diffusely decreased T1 bone marrow signal throughout the visible chest. No associated marrow edema or enhancement except at T12. Chronic moderate to severe T7 compression fracture. T12 compression fracture with severe loss of vertebral body height and mild to moderate vertebral body marrow edema (series 6, image 7). More central T2 and STIR hyperintensity plus gas within the body likely due to AVN. See series 5, image 6 and series 7, image 6. Retropulsion of the posterosuperior endplate greater on the right. Subsequent spinal stenosis with up to mild right hemi cord spinal cord mass effect (series 7, image 6 and series 8, image 39). No associated spinal cord signal abnormality. Cord: Spinal cord signal is within normal limits at all visualized levels. Capacious thoracic spinal canal except at  T12. No abnormal intradural enhancement. Paraspinal and other soft tissues: Cardiomegaly. Decreased T2 signal in the visible liver and spleen. Posterior thoracic paraspinal soft tissues are within normal limits. Disc levels: Capacious thoracic spinal canal with mild or age congruent thoracic spine  degeneration. No spinal stenosis except at T11-T12. MRI LUMBAR SPINE FINDINGS Segmentation:  Normal. Alignment:  Mild exaggeration of upper lumbar lordosis. Vertebrae: Diffusely decreased T1 marrow signal in the visible lumbosacral spine and pelvis, but less pronounced than in the thoracic spine. Mild enhancement of the L5 inferior endplate appears to be related to a small Schmorl node (series 17, image 6). No suspicious osseous lesion. Chronic L1 and L2 compression fractures with no marrow edema. Mild to moderate retropulsion of bone at both levels. Visible sacrum and SI joints intact. Conus medullaris: Extends to the L1 level and appears normal. No abnormal intradural enhancement. Paraspinal and other soft tissues: Decreased T2 signal in the visible liver and spleen. Severe diverticulosis of colon in the pelvis with no active inflammation. Mild lumbar spine subcutaneous edema. Disc levels: Capacious lumbar spinal canal. No lumbar spinal stenosis except for that related to bony retropulsion at the L1 and L2 levels, mild. IMPRESSION: 1. 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 Retropulsion of the posterosuperior T12 endplate resulting in spinal stenosis with up to mild right hemi cord mass effect, but no associated spinal cord signal abnormality. 2. Chronic T7, L1 and L2 compression fractures. 3. 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 4. Severe diverticulosis of the distal colon with no active inflammation. Electronically Signed   By: Genevie Ann M.D.   On:  01/16/2017 16:08   Mr Lumbar Spine W Wo Contrast  Result Date: 01/16/2017 CLINICAL DATA:  83 year old female with thoracolumbar compression fractures. Being considered for vertebroplasty. EXAM: MRI THORACIC AND LUMBAR SPINE WITHOUT AND WITH CONTRAST TECHNIQUE: Multiplanar and multiecho pulse sequences of the thoracic and lumbar spine were obtained without and with intravenous contrast. CONTRAST:  60m MULTIHANCE GADOBENATE DIMEGLUMINE 529 MG/ML IV SOLN COMPARISON:  Portable chest radiographs 10/23/2016 and earlier. Thoracic spine radiographs 06/20/2009 and earlier. Cervical spine MRI 09/11/2004. FINDINGS: MRI THORACIC SPINE FINDINGS Limited sagittal imaging of the cervical spine: Cervical vertebral height and alignment probably has not significantly changed since 2005. Thoracic segmentation:  Appears to be normal. Alignment: Straightening of lower thoracic kyphosis. Mild focal kyphosis related to compression fractures at T7 and T12. Vertebrae: Diffusely decreased T1 bone marrow signal throughout the visible chest. No associated marrow edema or enhancement except at T12. Chronic moderate to severe T7 compression fracture. T12 compression fracture with severe loss of vertebral body height and mild to moderate vertebral body marrow edema (series 6, image 7). More central T2 and STIR hyperintensity plus gas within the body likely due to AVN. See series 5, image 6 and series 7, image 6. Retropulsion of the posterosuperior endplate greater on the right. Subsequent spinal stenosis with up to mild right hemi cord spinal cord mass effect (series 7, image 6 and series 8, image 39). No associated spinal cord signal abnormality. Cord: Spinal cord signal is within normal limits at all visualized levels. Capacious thoracic spinal canal except at T12. No abnormal intradural enhancement. Paraspinal and other soft tissues: Cardiomegaly. Decreased T2 signal in the visible liver and spleen. Posterior thoracic paraspinal soft  tissues are within normal limits. Disc levels: Capacious thoracic spinal canal with mild or age congruent thoracic spine degeneration. No spinal stenosis except at T11-T12. MRI LUMBAR SPINE FINDINGS Segmentation:  Normal. Alignment:  Mild exaggeration of upper lumbar lordosis. Vertebrae: Diffusely decreased T1 marrow signal in the visible lumbosacral spine and pelvis, but less pronounced than  in the thoracic spine. Mild enhancement of the L5 inferior endplate appears to be related to a small Schmorl node (series 17, image 6). No suspicious osseous lesion. Chronic L1 and L2 compression fractures with no marrow edema. Mild to moderate retropulsion of bone at both levels. Visible sacrum and SI joints intact. Conus medullaris: Extends to the L1 level and appears normal. No abnormal intradural enhancement. Paraspinal and other soft tissues: Decreased T2 signal in the visible liver and spleen. Severe diverticulosis of colon in the pelvis with no active inflammation. Mild lumbar spine subcutaneous edema. Disc levels: Capacious lumbar spinal canal. No lumbar spinal stenosis except for that related to bony retropulsion at the L1 and L2 levels, mild. IMPRESSION: 1. 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 Retropulsion of the posterosuperior T12 endplate resulting in spinal stenosis with up to mild right hemi cord mass effect, but no associated spinal cord signal abnormality. 2. Chronic T7, L1 and L2 compression fractures. 3. 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 4. Severe diverticulosis of the distal colon with no active inflammation. Electronically Signed   By: Genevie Ann M.D.   On: 01/16/2017 16:08   Dg Radiologist Eval And Mgmt  Result Date: 01/17/2017 CONSULTATION: Patient Name: [name], [name] Patient DOB:   03-Dec-1932 Age: 83 y/o MRN: [id number] Patient's  Current Pain Level: 32 Dear Dr. Roseanne Kaufman ; [name] is an 83 year old who came today with her niece who is her power of attorney for consideration of vertebroplasty of a T12 fracture. She has severe thoracolumbar junction pain which has been persistent since the fall. She answers 23 of the 24 questions on the [employer] disability questionnaire in the affirmative. She is taking Vicodin for pain relief but this does not relieve her pain. Imaging shows an un healed fracture at that level with a fluid-filled cavity. She has old healed fractures at other thoracic and lumbar levels. Superior endplate fracture at L2 is late subacute with minimal residual edema, but largely healed and probably not worth treating at this point. She has severe chronic obstructive lung disease and is on 3 L of oxygen with an oxygen saturation of 92%. She also has a history of vascular disease, stroke, congestive heart failure and hypothyroidism. Her medication list is extensive and includes multiple medications for treatment of vascular disease and lung disease. She is not taking any anticoagulation. We had a long discussion about her case and the procedure of vertebral augmentation. I explained that due to the complex nature of her spinal disease, it was possible that even after a successful procedure she would have persistent back pain. I do think she should see a reduction in the severe pain at the thoracolumbar junction. Because of the severity of her COPD, I did not think it is appropriate to treat her at our Outpatient office. I had a discussion with the interventional team at [facility] which would be an better positioned to safely do this procedure. The patient and her niece understand the plan for hospital based intervention. They are interested in having this done as soon as possible because of the severity of her thoracolumbar back pain. The interventional team at [facility] will be contacting her today or tomorrow  to do screening, insurance clearance and scheduling. Time spent with patient and niece: 45 minutes. Electronically Signed   By: Jan Fireman.D.  On: 01/17/2017 14:26    Labs:  CBC:  Recent Labs  10/17/16 1803 10/25/16 0424 12/19/16 1027 01/21/17 0716  WBC 8.8 12.7* 6.5 8.7  HGB 12.4 12.8 13.0 13.0  HCT 41.6 41.5 39.8 42.1  PLT 291 264 233 196    COAGS:  Recent Labs  01/21/17 0716  INR 0.96  APTT 28    BMP:  Recent Labs  10/22/16 0414 10/24/16 0429 10/25/16 0424 12/19/16 1027 01/21/17 0716  NA 137 138 140 142 137  K 4.2 4.0 4.3 3.8 3.6  CL 96* 95* 95*  --  94*  CO2 32 35* 39* 33* 35*  GLUCOSE 176* 192* 209* 156* 113*  BUN 23* 27* 26* 15.8 11  CALCIUM 7.9* 7.8* 7.8* 9.8 9.0  CREATININE 0.67 0.65 0.56 0.8 0.69  GFRNONAA >60 >60 >60  --  >60  GFRAA >60 >60 >60  --  >60    LIVER FUNCTION TESTS:  Recent Labs  06/24/16 1413 10/17/16 1803 12/19/16 1027  BILITOT 0.7 0.4 0.42  AST _0 ALT 14 11* 11  ALKPHOS 69 86 96  PROT 6.9 7.4 6.7  ALBUMIN 3.6 4.0 3.6    TUMOR MARKERS: No results for input(s): AFPTM, CEA, CA199, CHROMGRNA in the last 8760 hours.  Assessment and Plan:  Painful T 12 acute fracture Scheduled now for VP/KP Risks and Benefits discussed with the patient including, but not limited to education regarding the natural healing process of compression fractures without intervention, bleeding, infection, cement migration which may cause spinal cord damage, paralysis, pulmonary embolism or even death. All of the patient's questions were answered, patient is agreeable to proceed. Consent signed and in chart.  Thank you for this interesting consult.  I greatly enjoyed meeting [name] and look forward to participating in their care.  A copy of this report was sent to the requesting provider on this date.  Electronically Signed: Ottis Sarnowski A 01/21/2017, 8:12 AM   I spent a total of  30 Minutes   in face to face in clinical  consultation, greater than 50% of which was counseling/coordinating care for T 12 KP/VP

## 2017-01-22 ENCOUNTER — Encounter (HOSPITAL_COMMUNITY): Payer: Self-pay | Admitting: Interventional Radiology

## 2017-01-22 ENCOUNTER — Other Ambulatory Visit (HOSPITAL_COMMUNITY): Payer: Self-pay | Admitting: Interventional Radiology

## 2017-01-22 DIAGNOSIS — M4850XA Collapsed vertebra, not elsewhere classified, site unspecified, initial encounter for fracture: Principal | ICD-10-CM

## 2017-01-22 DIAGNOSIS — IMO0001 Reserved for inherently not codable concepts without codable children: Secondary | ICD-10-CM

## 2017-01-31 ENCOUNTER — Encounter: Payer: Self-pay | Admitting: Adult Health

## 2017-01-31 ENCOUNTER — Ambulatory Visit (INDEPENDENT_AMBULATORY_CARE_PROVIDER_SITE_OTHER)
Admission: RE | Admit: 2017-01-31 | Discharge: 2017-01-31 | Disposition: A | Discharge status: Home / Self Care | Payer: Medicare Other | Source: Ambulatory Visit | Attending: Adult Health | Admitting: Adult Health

## 2017-01-31 ENCOUNTER — Ambulatory Visit (INDEPENDENT_AMBULATORY_CARE_PROVIDER_SITE_OTHER): Payer: Medicare Other | Admitting: Adult Health

## 2017-01-31 VITALS — BP 126/84 | HR 74

## 2017-01-31 DIAGNOSIS — J441 Chronic obstructive pulmonary disease with (acute) exacerbation: Secondary | ICD-10-CM

## 2017-01-31 DIAGNOSIS — J9611 Chronic respiratory failure with hypoxia: Secondary | ICD-10-CM

## 2017-01-31 MED ORDER — LEVOFLOXACIN 500 MG PO TABS: 500.0000 mg | ORAL_TABLET | Freq: Every day | ORAL | 0 refills | Status: AC

## 2017-01-31 MED ORDER — PREDNISONE 10 MG PO TABS: ORAL_TABLET | ORAL | 0 refills | Status: DC

## 2017-01-31 NOTE — Patient Instructions (Addendum)
Levaquin 500mg  daily for 7 days .  Prednisone taper over next week.  Mucinex DM Twice daily  As needed  Cough/congestion  Continue on Symbicort and Incruse.  Continue on Oxygen 3l/m .  Follow up with Dr. Lamonte Sakai  In 2 weeks as planned and As needed   Please contact office for sooner follow up if symptoms do not improve or worsen or seek emergency care  Chest xray today .

## 2017-01-31 NOTE — Assessment & Plan Note (Signed)
Cont on o2 .  

## 2017-01-31 NOTE — Progress Notes (Signed)
@Patient  ID: [name], female    DOB: 12-Jul-1933, 83 y.o.   MRN: [id number]  Chief Complaint  Patient presents with  . Acute Visit    COPD    Referring provider: [name], MD  HPI: 83 yo female former smoker followed for severe COPD, Diastolic Dysfunction and O2 RF .  Previous bladder cancer w/p resection and radiation in 2017   01/31/2017 Acute OV  Pt returns for an acute office visit . She complains of 1 week of cough , congestion , wheezing and sinus drainage. No otc use. Remains on Symbicort and incruse. .  She has been having chronic back pain and had kyphoplasty last week. Did not help much . Still has pain.  No hemopytis s, fever , chest pain , orthopnea, or edema.  Appetite is good , no nv/d.   Allergies  Allergen Reactions  . Latex Hives and Itching    Immunization History  Administered Date(s) Administered  . Influenza Split 06/04/2011, 06/01/2012, 05/31/2013  . Influenza Whole 06/16/2009, 08/14/2010  . Influenza, High Dose Seasonal PF 08/29/2016  . Influenza-Unspecified 07/04/2014, 06/16/2015  . Pneumococcal Conjugate-13 10/08/2013  . Pneumococcal Polysaccharide-23 07/27/2009  . Tdap 06/17/2011    Past Medical History:  Diagnosis Date  . Allergic rhinitis   . Anemia 04/23/2012   "severe @ times; think caused by colonic AVMs"  . Anginal pain ([place])    "pressure"  . Arthritis    "probably"  . Asthma   . Breast cancer ([place])   . COPD (chronic obstructive pulmonary disease) ([place])   . Coronary atherosclerosis of native coronary artery    Cath 1/11: RCA 50-60 ostial o/w normal. EF 70%.   . Dyspnea 04/23/2012   "all the time"  . Edema   . Emphysema   . Emphysema   . Gastric AVM   . H/O hiatal hernia   . Heart murmur   . History of blood transfusion    "many times"  . Hypertension   . Hypertr obst cardiomyop    Echo 10/10 EF 65-70% SW 1.6 PW 1.4 mild SAM no LVOT gradient at rest. Valsalva not performed Mild MR. No hyperenhance ment on MRI  EF 69%.  . Hypothyroidism   . Occlusion and stenosis of carotid artery    s/p L CEA u/s 3/11. R 60-79%; L 40-59%. no change since 3/10  . Oxygen dependent 04/23/2012   "concentrated at night; liquid during day"  . Peripheral vascular disease, unspecified ([place])   . Pneumonia   . Pulmonary hypertension ([place])    Cath 1/11 RA 8, RV 43/6/14, PA 42/17 (29) PCWP 15 Fick  5.0/2.6. PVR 2.8    . Stroke [facility]) ~ 2004   denies residual on 04/23/2012    Tobacco History: History  Smoking Status  . Former Smoker  . Packs/day: 1.00  . Years: 30.00  . Types: Cigarettes  . Quit date: 09/16/1997  Smokeless Tobacco  . Never Used   Counseling given: Not Answered   Outpatient Encounter Prescriptions as of 01/31/2017  Medication Sig  . albuterol (PROVENTIL HFA;VENTOLIN HFA) 108 (90 Base) MCG/ACT inhaler Inhale 2 puffs into the lungs every 6 (six) hours as needed for wheezing or shortness of breath.  . ALPRAZolam (XANAX) 0.5 MG tablet Take 1 tablet (0.5 mg total) by mouth at bedtime as needed for anxiety or sleep.  Marland Kitchen atorvastatin (LIPITOR) 20 MG tablet Take 20 mg by mouth daily.    . budesonide-formoterol (SYMBICORT) 160-4.5 MCG/ACT inhaler Inhale 2 puffs  into the lungs 2 (two) times daily.  . cholecalciferol (VITAMIN D) 1000 units tablet Take 1,000 Units by mouth daily.  Marland Kitchen dextromethorphan (DELSYM) 30 MG/5ML liquid Take 30 mg by mouth at bedtime as needed for cough.   . escitalopram (LEXAPRO) 20 MG tablet Take 20 mg by mouth daily.   . furosemide (LASIX) 40 MG tablet Take 40 mg by mouth daily.  Marland Kitchen guaiFENesin (MUCINEX) 600 MG 12 hr tablet Take 2 tablets (1,200 mg total) by mouth 2 (two) times daily. (Patient taking differently: Take 1,200 mg by mouth 2 (two) times daily as needed for cough. )  . HYDROcodone-acetaminophen (NORCO/VICODIN) 5-325 MG tablet Take 1 tablet by mouth every 4 (four) hours as needed for moderate pain.   . INCRUSE ELLIPTA 62.5 MCG/INH AEPB INHALE 1 PUFF BY MOUTH ONCE DAILY AT BEDTIME.    Marland Kitchen levothyroxine (SYNTHROID, LEVOTHROID) 150 MCG tablet Take 150 mcg by mouth daily before breakfast.   . metoprolol tartrate (LOPRESSOR) 25 MG tablet Take 25 mg by mouth 2 (two) times daily.  . Multiple Vitamin (MULTIVITAMIN WITH MINERALS) TABS tablet Take 1 tablet by mouth daily.  . pantoprazole (PROTONIX) 40 MG tablet Take 1 tablet (40 mg total) by mouth 2 (two) times daily.  . polyethylene glycol (MIRALAX / GLYCOLAX) packet Take 17 g by mouth daily as needed for mild constipation.  Marland Kitchen tolterodine (DETROL LA) 2 MG 24 hr capsule Take 2 mg by mouth 2 (two) times daily.   . traZODone (DESYREL) 100 MG tablet Take 100 mg by mouth at bedtime.   . VOLTAREN 1 % GEL Apply 2 g topically 4 (four) times daily as needed. Apply to hand for pain  . levofloxacin (LEVAQUIN) 500 MG tablet Take 1 tablet (500 mg total) by mouth daily.  . predniSONE (DELTASONE) 10 MG tablet 4 tabs for 2 days, then 3 tabs for 2 days, 2 tabs for 2 days, then 1 tab for 2 days, then stop   No facility-administered encounter medications on file as of 01/31/2017.      Review of Systems  Constitutional:   No  weight loss, night sweats,  Fevers, chills, + fatigue, or  lassitude.  HEENT:   No headaches,  Difficulty swallowing,  Tooth/dental problems, or  Sore throat,                No sneezing, itching, ear ache, + nasal congestion, post nasal drip,   CV:  No chest pain,  Orthopnea, PND, swelling in lower extremities, anasarca, dizziness, palpitations, syncope.   GI  No heartburn, indigestion, abdominal pain, nausea, vomiting, diarrhea, change in bowel habits, loss of appetite, bloody stools.   Resp:   No chest wall deformity  Skin: no rash or lesions.  GU: no dysuria, change in color of urine, no urgency or frequency.  No flank pain, no hematuria   MS:  No joint pain or swelling.  No decreased range of motion.  No back pain.    Physical Exam  BP 126/84 (BP Location: Left Arm, Cuff Size: Normal)   Pulse 74   SpO2 93%    GEN: A/Ox3; pleasant , NAD, elderly , in wc on O2    HEENT:  [place]/AT,  EACs-clear, TMs-wnl, NOSE-clear, THROAT-clear, no lesions, no postnasal drip or exudate noted.   NECK:  Supple w/ fair ROM; no JVD; normal carotid impulses w/o bruits; no thyromegaly or nodules palpated; no lymphadenopathy.    RESP coarse rhonchi , . no accessory muscle use, no dullness to percussion  CARD:  RRR, no m/r/g, tr -1 peripheral edema, pulses intact, no cyanosis or clubbing.  GI:   Soft & nt; nml bowel sounds; no organomegaly or masses detected.   Musco: Warm bil, no deformities or joint swelling noted.   Neuro: alert, no focal deficits noted.    Skin: Warm, no lesions or rashes   Lab Results:  CBC  BMET   Imaging: Mr Thoracic Spine W Wo Contrast  Result Date: 01/16/2017 CLINICAL DATA:  83 year old female with thoracolumbar compression fractures. Being considered for vertebroplasty. EXAM: MRI THORACIC AND LUMBAR SPINE WITHOUT AND WITH CONTRAST TECHNIQUE: Multiplanar and multiecho pulse sequences of the thoracic and lumbar spine were obtained without and with intravenous contrast. CONTRAST:  37mL MULTIHANCE GADOBENATE DIMEGLUMINE 529 MG/ML IV SOLN COMPARISON:  Portable chest radiographs 10/23/2016 and earlier. Thoracic spine radiographs 06/20/2009 and earlier. Cervical spine MRI 09/11/2004. FINDINGS: MRI THORACIC SPINE FINDINGS Limited sagittal imaging of the cervical spine: Cervical vertebral height and alignment probably has not significantly changed since 2005. Thoracic segmentation:  Appears to be normal. Alignment: Straightening of lower thoracic kyphosis. Mild focal kyphosis related to compression fractures at T7 and T12. Vertebrae: Diffusely decreased T1 bone marrow signal throughout the visible chest. No associated marrow edema or enhancement except at T12. Chronic moderate to severe T7 compression fracture. T12 compression fracture with severe loss of vertebral body height and mild to moderate  vertebral body marrow edema (series 6, image 7). More central T2 and STIR hyperintensity plus gas within the body likely due to AVN. See series 5, image 6 and series 7, image 6. Retropulsion of the posterosuperior endplate greater on the right. Subsequent spinal stenosis with up to mild right hemi cord spinal cord mass effect (series 7, image 6 and series 8, image 39). No associated spinal cord signal abnormality. Cord: Spinal cord signal is within normal limits at all visualized levels. Capacious thoracic spinal canal except at T12. No abnormal intradural enhancement. Paraspinal and other soft tissues: Cardiomegaly. Decreased T2 signal in the visible liver and spleen. Posterior thoracic paraspinal soft tissues are within normal limits. Disc levels: Capacious thoracic spinal canal with mild or age congruent thoracic spine degeneration. No spinal stenosis except at T11-T12. MRI LUMBAR SPINE FINDINGS Segmentation:  Normal. Alignment:  Mild exaggeration of upper lumbar lordosis. Vertebrae: Diffusely decreased T1 marrow signal in the visible lumbosacral spine and pelvis, but less pronounced than in the thoracic spine. Mild enhancement of the L5 inferior endplate appears to be related to a small Schmorl node (series 17, image 6). No suspicious osseous lesion. Chronic L1 and L2 compression fractures with no marrow edema. Mild to moderate retropulsion of bone at both levels. Visible sacrum and SI joints intact. Conus medullaris: Extends to the L1 level and appears normal. No abnormal intradural enhancement. Paraspinal and other soft tissues: Decreased T2 signal in the visible liver and spleen. Severe diverticulosis of colon in the pelvis with no active inflammation. Mild lumbar spine subcutaneous edema. Disc levels: Capacious lumbar spinal canal. No lumbar spinal stenosis except for that related to bony retropulsion at the L1 and L2 levels, mild. IMPRESSION: 1. Acute to subacute severe T12 compression fracture with  associated Kummel disease. Retropulsion of the posterosuperior T12 endplate resulting in spinal stenosis with up to mild right hemi cord mass effect, but no associated spinal cord signal abnormality. 2. Chronic T7, L1 and L2 compression fractures. 3. 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 4. Severe diverticulosis of the distal colon with no active inflammation. Electronically Signed   By: Genevie Ann M.D.   On: 01/16/2017 16:08   Mr Lumbar Spine W Wo Contrast  Result Date: 01/16/2017 CLINICAL DATA:  83 year old female with thoracolumbar compression fractures. Being considered for vertebroplasty. EXAM: MRI THORACIC AND LUMBAR SPINE WITHOUT AND WITH CONTRAST TECHNIQUE: Multiplanar and multiecho pulse sequences of the thoracic and lumbar spine were obtained without and with intravenous contrast. CONTRAST:  8mL MULTIHANCE GADOBENATE DIMEGLUMINE 529 MG/ML IV SOLN COMPARISON:  Portable chest radiographs 10/23/2016 and earlier. Thoracic spine radiographs 06/20/2009 and earlier. Cervical spine MRI 09/11/2004. FINDINGS: MRI THORACIC SPINE FINDINGS Limited sagittal imaging of the cervical spine: Cervical vertebral height and alignment probably has not significantly changed since 2005. Thoracic segmentation:  Appears to be normal. Alignment: Straightening of lower thoracic kyphosis. Mild focal kyphosis related to compression fractures at T7 and T12. Vertebrae: Diffusely decreased T1 bone marrow signal throughout the visible chest. No associated marrow edema or enhancement except at T12. Chronic moderate to severe T7 compression fracture. T12 compression fracture with severe loss of vertebral body height and mild to moderate vertebral body marrow edema (series 6, image 7). More central T2 and STIR hyperintensity plus gas within the body likely due to AVN. See series 5, image 6 and series 7, image 6.  Retropulsion of the posterosuperior endplate greater on the right. Subsequent spinal stenosis with up to mild right hemi cord spinal cord mass effect (series 7, image 6 and series 8, image 39). No associated spinal cord signal abnormality. Cord: Spinal cord signal is within normal limits at all visualized levels. Capacious thoracic spinal canal except at T12. No abnormal intradural enhancement. Paraspinal and other soft tissues: Cardiomegaly. Decreased T2 signal in the visible liver and spleen. Posterior thoracic paraspinal soft tissues are within normal limits. Disc levels: Capacious thoracic spinal canal with mild or age congruent thoracic spine degeneration. No spinal stenosis except at T11-T12. MRI LUMBAR SPINE FINDINGS Segmentation:  Normal. Alignment:  Mild exaggeration of upper lumbar lordosis. Vertebrae: Diffusely decreased T1 marrow signal in the visible lumbosacral spine and pelvis, but less pronounced than in the thoracic spine. Mild enhancement of the L5 inferior endplate appears to be related to a small Schmorl node (series 17, image 6). No suspicious osseous lesion. Chronic L1 and L2 compression fractures with no marrow edema. Mild to moderate retropulsion of bone at both levels. Visible sacrum and SI joints intact. Conus medullaris: Extends to the L1 level and appears normal. No abnormal intradural enhancement. Paraspinal and other soft tissues: Decreased T2 signal in the visible liver and spleen. Severe diverticulosis of colon in the pelvis with no active inflammation. Mild lumbar spine subcutaneous edema. Disc levels: Capacious lumbar spinal canal. No lumbar spinal stenosis except for that related to bony retropulsion at the L1 and L2 levels, mild. IMPRESSION: 1. 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 Retropulsion of the posterosuperior T12 endplate resulting in spinal stenosis with up to mild right hemi cord mass effect, but no associated spinal cord  signal abnormality. 2. Chronic T7, L1 and L2 compression fractures. 3. 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 4. Severe diverticulosis of the distal colon with no active inflammation. Electronically Signed   By: Genevie Ann M.D.   On: 01/16/2017 16:08   Dg Radiologist Eval And Mgmt  Result Date: 01/17/2017  CONSULTATION: Patient Name: TUYET, BADER Patient DOB:   [birth year] Age: 83 y/o MRN: [id number] Patient's Current Pain Level: 86 Dear Dr. Roseanne Kaufman ; Mrs. Deveney is an 83 year old who came today with her niece who is her power of attorney for consideration of vertebroplasty of a T12 fracture. She has severe thoracolumbar junction pain which has been persistent since the fall. She answers 23 of the 24 questions on the [employer] disability questionnaire in the affirmative. She is taking Vicodin for pain relief but this does not relieve her pain. Imaging shows an un healed fracture at that level with a fluid-filled cavity. She has old healed fractures at other thoracic and lumbar levels. Superior endplate fracture at L2 is late subacute with minimal residual edema, but largely healed and probably not worth treating at this point. She has severe chronic obstructive lung disease and is on 3 L of oxygen with an oxygen saturation of 92%. She also has a history of vascular disease, stroke, congestive heart failure and hypothyroidism. Her medication list is extensive and includes multiple medications for treatment of vascular disease and lung disease. She is not taking any anticoagulation. We had a long discussion about her case and the procedure of vertebral augmentation. I explained that due to the complex nature of her spinal disease, it was possible that even after a successful procedure she would have persistent back pain. I do think she should see a reduction in the severe pain at the  thoracolumbar junction. Because of the severity of her COPD, I did not think it is appropriate to treat her at our Outpatient office. I had a discussion with the interventional team at [facility] which would be an better positioned to safely do this procedure. The patient and her niece understand the plan for hospital based intervention. They are interested in having this done as soon as possible because of the severity of her thoracolumbar back pain. The interventional team at [facility] will be contacting her today or tomorrow to do screening, insurance clearance and scheduling. Time spent with patient and niece: 45 minutes. Electronically Signed   By: Nelson Chimes M.D.   On: 01/17/2017 14:26   Ir Vertebroplasty Cerv/thor Bx Inc Uni/bil Inc/inject/imaging  Result Date: 01/22/2017 INDICATION: Severe low back pain secondary to compression fracture at T12. EXAM: VERTEBROPLASTY MEDICATIONS: As antibiotic prophylaxis, 2 g Ancef IV was ordered pre-procedure and administered intravenously within 1 hour of incision. ANESTHESIA/SEDATION: Moderate (conscious) sedation was employed during this procedure. A total of Versed 3 mg and Fentanyl 75 mcg IV, Dilaudid 1 mg IV was administered intravenously. Moderate Sedation Time: 25 minutes. The patient's level of consciousness and vital signs were monitored continuously by radiology nursing throughout the procedure under my direct supervision. FLUOROSCOPY TIME:  Fluoroscopy Time: 7 minutes 18 seconds (778 mGy) COMPLICATIONS: None immediate. TECHNIQUE: Informed written consent was obtained from the patient after a thorough discussion of the procedural risks, benefits and alternatives. All questions were addressed. Maximal Sterile Barrier Technique was utilized including caps, mask, sterile gowns, sterile gloves, sterile drape, hand hygiene and skin antiseptic. A timeout was performed prior to the initiation of the procedure. PROCEDURE: The patient was placed prone on the  fluoroscopic table. Nasal oxygen was administered. Physiologic monitoring was performed throughout the duration of the procedure. The skin overlying the thoracic region was prepped and draped in the usual sterile fashion. The T12 vertebral body was identified and the right pedicle was infiltrated with 0.25% Bupivacaine. This was then followed by  the advancement of a 13-gauge Cook needle through the right pedicle into the posterior one-third at T12. Through this, an 35 gauge core biopsy needle was then advanced under fluoroscopic guidance to the anterior 1/3. Using a 20 mL syringe, a core sample was obtained and sent for pathologic analysis. A gentle contrast injection demonstrated a trabecular pattern of contrast with opacification of a paraspinous vein. This prompted the use of Gel-Foam pledgets in the 13 gauge Cook spinal needle prior to the delivery of the methylmethacrylate mixture. At this time, methylmethacrylate mixture was reconstituted. Under biplane intermittent fluoroscopy, the methylmethacrylate was then injected into the T12 vertebral body with filling of the vertebral body. No extravasation was noted into the disk spaces or posteriorly into the spinal canal. No epidural venous contamination was seen. The needle was then removed. Hemostasis was achieved at the skin entry site. There were no acute complications. Patient tolerated the procedure well. The patient was observed for 3 hours and discharged in good condition. IMPRESSION: 1. Status post vertebral body augmentation for painful compression fracture at T12 using vertebroplasty technique. Core biopsy obtained at T12 after checking with Dr. Tonita Cong, the patient's referring physician, in view of the patient's past history of bladder carcinoma. The patient's niece, the legal representative, was instructed to call Dr. Reather Littler office to check on the biopsy report. Electronically Signed   By: Luanne Bras M.D.   On: 01/21/2017 11:26     Assessment  & Plan:   COPD with acute exacerbation ([place]) Flare   Plan  Patient Instructions  Levaquin 500mg  daily for 7 days .  Prednisone taper over next week.  Mucinex DM Twice daily  As needed  Cough/congestion  Continue on Symbicort and Incruse.  Continue on Oxygen 3l/m .  Follow up with Dr. Lamonte Sakai  In 2 weeks as planned and As needed   Please contact office for sooner follow up if symptoms do not improve or worsen or seek emergency care  Chest xray today .      Chronic respiratory failure with hypoxia (HCC) Cont on o2      Gean Larose, NP 01/31/2017

## 2017-01-31 NOTE — Addendum Note (Signed)
Addended by: Parke Poisson E on: 01/31/2017 12:41 PM   Modules accepted: Orders

## 2017-01-31 NOTE — Assessment & Plan Note (Signed)
Flare   Plan  Patient Instructions  Levaquin 500mg  daily for 7 days .  Prednisone taper over next week.  Mucinex DM Twice daily  As needed  Cough/congestion  Continue on Symbicort and Incruse.  Continue on Oxygen 3l/m .  Follow up with Dr. Lamonte Sakai  In 2 weeks as planned and As needed   Please contact office for sooner follow up if symptoms do not improve or worsen or seek emergency care  Chest xray today .

## 2017-02-04 ENCOUNTER — Ambulatory Visit (HOSPITAL_COMMUNITY): Admission: RE | Admit: 2017-02-04 | Payer: Medicare Other | Source: Ambulatory Visit

## 2017-02-05 NOTE — Progress Notes (Signed)
Called spoke with patient, advised of cxr results / recs as stated by TP.  Pt verbalized her understanding and denied any questions. 

## 2017-02-11 ENCOUNTER — Other Ambulatory Visit (HOSPITAL_COMMUNITY): Payer: Self-pay | Admitting: Interventional Radiology

## 2017-02-11 DIAGNOSIS — M4850XA Collapsed vertebra, not elsewhere classified, site unspecified, initial encounter for fracture: Principal | ICD-10-CM

## 2017-02-11 DIAGNOSIS — IMO0001 Reserved for inherently not codable concepts without codable children: Secondary | ICD-10-CM

## 2017-02-12 ENCOUNTER — Ambulatory Visit: Payer: Medicare Other | Admitting: Pulmonary Disease

## 2017-02-13 ENCOUNTER — Other Ambulatory Visit: Payer: Medicare Other

## 2017-02-13 ENCOUNTER — Other Ambulatory Visit: Payer: Self-pay | Admitting: Specialist

## 2017-02-13 DIAGNOSIS — M546 Pain in thoracic spine: Secondary | ICD-10-CM

## 2017-02-13 DIAGNOSIS — M545 Low back pain: Secondary | ICD-10-CM

## 2017-02-14 ENCOUNTER — Other Ambulatory Visit: Payer: Medicare Other

## 2017-02-14 ENCOUNTER — Ambulatory Visit (INDEPENDENT_AMBULATORY_CARE_PROVIDER_SITE_OTHER): Payer: Medicare Other | Admitting: Internal Medicine

## 2017-02-14 ENCOUNTER — Encounter: Payer: Self-pay | Admitting: Internal Medicine

## 2017-02-14 VITALS — BP 112/62 | HR 77 | Temp 98.9°F

## 2017-02-14 DIAGNOSIS — J9611 Chronic respiratory failure with hypoxia: Secondary | ICD-10-CM

## 2017-02-14 DIAGNOSIS — J441 Chronic obstructive pulmonary disease with (acute) exacerbation: Secondary | ICD-10-CM

## 2017-02-14 DIAGNOSIS — J9612 Chronic respiratory failure with hypercapnia: Secondary | ICD-10-CM

## 2017-02-14 MED ORDER — TIOTROPIUM BROMIDE MONOHYDRATE 2.5 MCG/ACT IN AERS: INHALATION_SPRAY | RESPIRATORY_TRACT | 11 refills | Status: DC

## 2017-02-14 MED ORDER — TIOTROPIUM BROMIDE MONOHYDRATE 2.5 MCG/ACT IN AERS: INHALATION_SPRAY | RESPIRATORY_TRACT | 0 refills | Status: DC

## 2017-02-14 MED ORDER — HYDROCODONE-ACETAMINOPHEN 5-325 MG PO TABS: 1.0000 | ORAL_TABLET | ORAL | 0 refills | Status: DC | PRN

## 2017-02-14 MED ORDER — ALBUTEROL SULFATE (2.5 MG/3ML) 0.083% IN NEBU: 2.5000 mg | INHALATION_SOLUTION | Freq: Four times a day (QID) | RESPIRATORY_TRACT | 12 refills | Status: DC | PRN

## 2017-02-14 MED ORDER — AMOXICILLIN-POT CLAVULANATE 875-125 MG PO TABS: 1.0000 | ORAL_TABLET | Freq: Two times a day (BID) | ORAL | 0 refills | Status: AC

## 2017-02-14 MED ORDER — PREDNISONE 10 MG PO TABS: ORAL_TABLET | ORAL | 0 refills | Status: DC

## 2017-02-14 NOTE — Progress Notes (Signed)
@Patient  ID: [name], female    DOB: 1933/04/20,    MRN: [id number]    HPI: 83 yo female former smoker followed for severe COPD, Diastolic Dysfunction and O2 RF .  Previous bladder cancer w/p resection and radiation in 2017   01/31/2017 Acute OV  Pt returns for an acute office visit . She complains of 1 week of cough , congestion , wheezing and sinus drainage. No otc use. Remains on Symbicort and incruse. .  She has been having chronic back pain and had kyphoplasty last week. Did not help much . Still has pain.  No hemopytis s, fever , chest pain , orthopnea, or edema.  rec Levaquin 500mg  daily for 7 days .  Prednisone taper over next week.  Mucinex DM Twice daily  As needed  Cough/congestion  Continue on Symbicort and Incruse.  Continue on Oxygen 3l/m .  Follow up with Dr. Lamonte Larson  In 2 weeks as planned and As needed      02/14/2017 acute extended ov/[name] re: ? Aeocopd/ maint rx symb and incruse  Chief Complaint  Patient presents with  . Acute Visit    pt c/o prod cough with yellow mucus, chest congestion.  just finished levaquin and pred taper which did not help.    has flutter valve not using  Using neb just once a day  Assoc with nasal congestion but mostly watery nasal discharge and no sinus pain  Doe = MMRC4  = sob if tries to leave home or while getting dressed  / still able to get room to room at home      No obvious patterns in day to day or daytime variability or assoc production of  mucus plugs or hemoptysis or cp or chest tightness, subjective wheeze or overt sinus or hb symptoms. No unusual exp hx or h/o childhood pna/ asthma or knowledge of premature birth.  Sleeping ok without nocturnal  or early am exacerbation  of respiratory  c/o's or need for noct saba. Also denies any obvious fluctuation of symptoms with weather or environmental changes or other aggravating or alleviating factors except as outlined above   Current Medications, Allergies, Complete Past  Medical History, Past Surgical History, Family History, and Social History were reviewed in [employer] record.  ROS  The following are not active complaints unless bolded sore throat, dysphagia, dental problems, itching, sneezing,  nasal congestion or excess/ purulent secretions, ear ache,   fever, chills, sweats, unintended wt loss, classically pleuritic or exertional cp,  orthopnea pnd or leg swelling, presyncope, palpitations, abdominal pain, anorexia, nausea, vomiting, diarrhea  or change in bowel or bladder habits, change in stools or urine, dysuria,hematuria,  rash, arthralgias, visual complaints, headache, numbness, weakness or ataxia or problems with walking or coordination,  change in mood/affect or memory.            Physical Exam  W/c bound elderly wf nad at rest unable to weigh today   Wt Readings from Last 3 Encounters:  01/21/17 148 lb (67.1 kg)  11/13/16 155 lb (70.3 kg)  10/18/16 158 lb 9.6 oz (71.9 kg)    Vital signs reviewed  - Note on arrival 02 sats  90% on 3lpm   Pulsed   HEENT: nl dentition, turbinates bilaterally, and oropharynx. Nl external ear canals without cough reflex   NECK :  without JVD/Nodes/TM/ nl carotid upstrokes bilaterally   LUNGS: no acc muscle use,  Barrel chest, junky insp and exp rhonchi  CV:  RRR  no s3 or murmur or increase in P2, and  1+ pitting sym  edema   ABD:  soft and nontender with nl inspiratory excursion in the supine position. No bruits or organomegaly appreciated, bowel sounds nl  MS:  Nl gait/ ext warm without deformities, calf tenderness, cyanosis or clubbing No obvious joint restrictions   SKIN: warm and dry without lesions    NEURO:  alert, approp, nl sensorium with  no motor or cerebellar deficits apparent.             I personally reviewed images and agree with radiology impression as follows:  CXR:   01/31/17 1. No acute cardiopulmonary disease. 2. Interstitial scarring and COPD,  without significant change from the prior study. 3. Multiple vertebral fractures.        Assessment & Plan:

## 2017-02-14 NOTE — Patient Instructions (Signed)
Plan A = Automatic = symbicort 160 Take 2 puffs first thing in am and then another 2 puffs about 12 hours later and spiriva 2.5 mg just in am   Work on inhaler technique:  relax and gently blow all the way out then take a nice smooth deep breath back in, triggering the inhaler at same time you start breathing in.  Hold for up to 5 seconds if you can. Blow out thru nose. Rinse and gargle with water when done      Plan B = Backup Only use your albuterol (ventolin) s a rescue medication to be used if you can't catch your breath by resting or doing a relaxed purse lip breathing pattern.  - The less you use it, the better it will work when you need it. - Ok to use the inhaler up to 2 puffs  every 4 hours if you must but call for appointment if use goes up over your usual need - Don't leave home without it !!  (think of it like the spare tire for your car)   Plan C = Crisis - only use your albuterol nebulizer if you first try Plan B and it fails to help > ok to use the nebulizer up to every 4 hours but if start needing it regularly call for immediate appointment    Augmentin 875 mg take one pill twice daily  X 10 days - take at breakfast and supper with large glass of water.  It would help reduce the usual side effects (diarrhea and yeast infections) if you ate cultured yogurt at lunch.   Prednisone 10 mg take  4 each am x 2 days,   2 each am x 2 days,  1 each am x 2 days and stop   For cough > mucinex up to 1200 mg every 12 hours and use the flutter as much as you can   For severe cough or pain > norco every 4 hours as needed  If worse > to ER

## 2017-02-15 ENCOUNTER — Ambulatory Visit
Admission: RE | Admit: 2017-02-15 | Discharge: 2017-02-15 | Disposition: A | Discharge status: Home / Self Care | Payer: Medicare Other | Source: Ambulatory Visit | Attending: Specialist | Admitting: Specialist

## 2017-02-15 ENCOUNTER — Encounter: Payer: Self-pay | Admitting: Internal Medicine

## 2017-02-15 DIAGNOSIS — J9611 Chronic respiratory failure with hypoxia: Secondary | ICD-10-CM | POA: Insufficient documentation

## 2017-02-15 DIAGNOSIS — J9612 Chronic respiratory failure with hypercapnia: Secondary | ICD-10-CM

## 2017-02-15 DIAGNOSIS — M545 Low back pain: Secondary | ICD-10-CM

## 2017-02-15 DIAGNOSIS — M546 Pain in thoracic spine: Secondary | ICD-10-CM

## 2017-02-15 NOTE — Assessment & Plan Note (Addendum)
PFT's  11/25/08   FEV1 1.56 (72 % ) ratio 58  p 4 % improvement from saba p ? prior to study with DLCO  44/44 % corrects to 71 % for alv volume   - 02/14/2017  After extensive coaching device effectiveness =    75%  With [facility] so try spiriva respimat 2 each am   DDX of  difficult airways management almost all start with A and  include Adherence, Ace Inhibitors, Acid Reflux, Active Sinus Disease, Alpha 1 Antitripsin deficiency, Anxiety masquerading as Airways dz,  ABPA,  Allergy(esp in young), Aspiration (esp in elderly), Adverse effects of meds,  Active smokers, A bunch of PE's (a small clot burden can't cause this syndrome unless there is already severe underlying pulm or vascular dz with poor reserve) plus two Bs  = Bronchiectasis and Beta blocker use..and one C= CHF  Adherence is always the initial "prime suspect" and is a multilayered concern that requires a "trust but verify" approach in every patient - starting with knowing how to use medications, especially inhalers, correctly, keeping up with refills and understanding the fundamental difference between maintenance and prns vs those medications only taken for a very short course and then stopped and not refilled.  - see hfa teaching   ? Acid (or non-acid) GERD > always difficult to exclude as up to 75% of pts in some series report no assoc GI/ Heartburn symptoms> rec continue max (24h)  acid suppression and diet restrictions/ reviewed     ? Active sinus dz > Augmentin 875 mg take one pill twice daily  X 10 days  - consider sinus CT   ? Allergy/ asthma > Prednisone 10 mg take  4 each am x 2 days,   2 each am x 2 days,  1 each am x 2 days and stop  ? Adverse effects of dpi > try off Incruse   ? Bronchiectasis > use flutter as much as possible   ? Beta blocker effect > doubt on relatively low doses of lopressor  ? chf > she does have edema / prob cor pulmonale >> continue las as per cards    I had an extended discussion with the patient  reviewing all relevant studies completed to date and  lasting 25 minutes of a 40  minute acute visit with pt not well known to me  re  severe non-specific but potentially very serious refractory respiratory symptoms of uncertain and potentially multiple  etiologies.   Each maintenance medication was reviewed in detail including most importantly the difference between maintenance and prns and under what circumstances the prns are to be triggered using an action plan format that is not reflected in the computer generated alphabetically organized AVS.    Please see AVS for specific instructions unique to this office visit that I personally wrote and verbalized to the the pt in detail and then reviewed with pt  by my nurse highlighting any changes in therapy/plan of care  recommended at today's visit.

## 2017-02-15 NOTE — Assessment & Plan Note (Signed)
HCO3  01/21/17  = 35   rx as of 02/14/2017    = 3 lpm 24/7   Adequate control on present rx, reviewed in detail with pt > no change in rx needed

## 2017-02-18 ENCOUNTER — Encounter: Payer: Self-pay | Admitting: Emergency Medicine

## 2017-02-18 ENCOUNTER — Ambulatory Visit (INDEPENDENT_AMBULATORY_CARE_PROVIDER_SITE_OTHER): Payer: Medicare Other | Admitting: Emergency Medicine

## 2017-02-18 ENCOUNTER — Telehealth: Payer: Self-pay | Admitting: Emergency Medicine

## 2017-02-18 DIAGNOSIS — J441 Chronic obstructive pulmonary disease with (acute) exacerbation: Secondary | ICD-10-CM

## 2017-02-18 MED ORDER — AZITHROMYCIN 250 MG PO TABS: ORAL_TABLET | ORAL | 0 refills | Status: DC

## 2017-02-18 MED ORDER — METHYLPREDNISOLONE ACETATE 80 MG/ML IJ SUSP
80.0000 mg | Freq: Once | INTRAMUSCULAR | Status: AC
  Administered 2017-02-18: 80 mg via INTRAMUSCULAR

## 2017-02-18 MED ORDER — HYDROCODONE-HOMATROPINE 5-1.5 MG/5ML PO SYRP: 5.0000 mL | ORAL_SOLUTION | Freq: Four times a day (QID) | ORAL | 0 refills | Status: DC | PRN

## 2017-02-18 MED ORDER — PREDNISONE 10 MG PO TABS: ORAL_TABLET | ORAL | 0 refills | Status: DC

## 2017-02-18 NOTE — Patient Instructions (Addendum)
Continue zyrtec daily Start fluticasone 2 sprays each nostril daily Finish your Augmentin prescription Start azithromycin and take until completely gone Depomedrol shot here today.  We will increase prednisone and treat for longer - a new prescription will be sent  Take hycodan 5cc up to every 6 hours as needed to suppress your cough.  Continue Spiriva until your sample runs out, then go back to [employer] as you are taking it.  Continue your oxygen at all times.  Follow here with Dr Lamonte Sakai or APP in 2-3 weeks to assess your progress.

## 2017-02-18 NOTE — Assessment & Plan Note (Signed)
Minimal improvement. She continues to have cough and is unable to clear secretions. She will continue her Mucinex, Zyrtec, start fluticasone nasal spray. She may require admission if she does not improve with Depo-Medrol, aggressive therapy this week.  Continue zyrtec daily Start fluticasone 2 sprays each nostril daily Finish your Augmentin prescription Start azithromycin and take until completely gone Depomedrol shot here today.  We will increase prednisone and treat for longer - a new prescription will be sent  Take hycodan 5cc up to every 6 hours as needed to suppress your cough.  Continue Spiriva until your sample runs out, then go back to [employer] as you are taking it.  Continue your oxygen at all times.  Follow here with Dr Lamonte Sakai or APP in 2-3 weeks to assess your progress.

## 2017-02-18 NOTE — Addendum Note (Signed)
Addended by: Benson Setting L on: 02/18/2017 11:59 AM   Modules accepted: Orders

## 2017-02-18 NOTE — Progress Notes (Signed)
Subjective:    Patient ID: [name], female    DOB: 05/28/1933, 83 y.o.   MRN: [id number]  HPI  83 yo Severe COPD, HTN + diastolic dysfxn, hypoxemia  Acute OV 02/13/16 -- acute visit, history of COPD, allergic rhinitis. Was well until about 1 weeks ago - spent some time outside, exposed to pollen. significant dyspnea, wheeze. Cough without sputum.   Has been taking her medications reliably and correctly.      ROV 07/05/16 -- This follow-up visit for patient with a history of severe COPD, hypertension and diastolic dysfunction, hypoxemic respiratory failure. She has experienced 2 exacerbations since her last visit in May 1 of which resulted in a hospitalization at the beginning of this month.  She went home 9 days ago. She remains on Incruse + symbicort. She is being managed at [facility] for bladder cancer - she has had bladder resection x 2, likely not a candidate for bladder removal. They are discussing possible chemo + XRT. They have questions today regarding overall prognosis, factoring in her resp disease.   She has had 3 flares this year. She is better in some regards, less hypoxemia. She is having nasal congestion, wet cough, continues to clear some mucous but not always. She is on zyrtec, uses flonase prn. Uses ventolin q6h  ROV 08/16/16 -- This is a follow-up visit for patient with a history of severe COPD, diastolic CHF and hypertension, hypoxemic respiratory failure. She also has a fairly new diagnosis of bladder cancer for which she has undergone surgical resection and is receiving radiation therapy. I have spoken with urology at [facility] to review her treatment. We both agree that she is not candidate for bladder resection. At her last visit we added fluticasone nasal spray, added Mucinex, continued Incruse and Symbicort. She has been having more fatigue since beginning XRT, continues to have cough. We spoke yesterday and she was having increased dyspnea and some desaturations. This prompted me  to start prednisone taper. She is using ventolin more freq than her baseline. No fevers. Cough is non-productive.                                                                                     ROV 10/17/16 -- patient has a history of chronic hypoxemic respiratory failure in the setting of severe COPD, diastolic CHF and hypertension. She also has bladder cancer. currently managed on incruse, symbicort. She has been more SOB and had more cough beginning 10/03/16. Cough wet and occasionally productive. No fever. She was just treated with levaquin and pred taper that she is about to complete. Her O2 is on 3L/min. She has not rebounded.   She has just finished XRT in late December.          ROV 02/18/17 -- this follow-up visit for patient with a history of severe COPD, chronic hypoxemic respiratory failure, hypertension with diastolic dysfunction, bladder cancer. She has been managed on Symbicort and Incruse. She was seen in mid May and also 4 days ago with acute exacerbation, treated with antibiotics and prednisone.  She is having severe cough, non-productive. The prednisone that she just started has not seemed to  help. Also Augmentin x 10 days. The sx have been present since mid May. She was asked to take mucinex, do flutter. She is taking zyrtec daily. She was changed from Incruse to Spiriva to see if the powder was a factor - there hasn't been any change.    No flowsheet data found.   Review of Systems As per HPI  Past Medical History:  Diagnosis Date  . Allergic rhinitis   . Anemia 04/23/2012   "severe @ times; think caused by colonic AVMs"  . Anginal pain ([place])    "pressure"  . Arthritis    "probably"  . Asthma   . Breast cancer ([place])   . COPD (chronic obstructive pulmonary disease) ([place])   . Coronary atherosclerosis of native coronary artery    Cath 1/11: RCA 50-60 ostial o/w normal. EF 70%.   . Dyspnea 04/23/2012   "all the time"  . Edema   . Emphysema   . Emphysema   . Gastric AVM     . H/O hiatal hernia   . Heart murmur   . History of blood transfusion    "many times"  . Hypertension   . Hypertr obst cardiomyop    Echo 10/10 EF 65-70% SW 1.6 PW 1.4 mild SAM no LVOT gradient at rest. Valsalva not performed Mild MR. No hyperenhance ment on MRI EF 69%.  . Hypothyroidism   . Occlusion and stenosis of carotid artery    s/p L CEA u/s 3/11. R 60-79%; L 40-59%. no change since 3/10  . Oxygen dependent 04/23/2012   "concentrated at night; liquid during day"  . Peripheral vascular disease, unspecified ([place])   . Pneumonia   . Pulmonary hypertension ([place])    Cath 1/11 RA 8, RV 43/6/14, PA 42/17 (29) PCWP 15 Fick  5.0/2.6. PVR 2.8    . Stroke [facility]) ~ 2004   denies residual on 04/23/2012     Family History  Problem Relation Age of Onset  . Emphysema Father   . Colon cancer Mother   . Stroke Mother   . COPD Sister   . Hypertension Sister        Pulmonary  . Emphysema Sister   . Asthma Sister   . Asthma Sister   . Emphysema Brother        x2  . Colon cancer Brother      Social History   Social History  . Marital status: Single    Spouse name: N/A  . Number of children: N/A  . Years of education: N/A   Occupational History  . retired Retired    Physicist, medical for an apartment complex   Social History Main Topics  . Smoking status: Former Smoker    Packs/day: 1.00    Years: 30.00    Types: Cigarettes    Quit date: 09/16/1997  . Smokeless tobacco: Never Used  . Alcohol use No  . Drug use: No  . Sexual activity: Not Currently   Other Topics Concern  . Not on file   Social History Narrative   Single, no children   Lives with 2 sisters   Does not get regular exercise     Allergies  Allergen Reactions  . Latex Hives and Itching     Outpatient Medications Prior to Visit  Medication Sig Dispense Refill  . albuterol (PROVENTIL HFA;VENTOLIN HFA) 108 (90 Base) MCG/ACT inhaler Inhale 2 puffs into the lungs every 6 (six) hours as needed for  wheezing or shortness of breath.  1 Inhaler 5  . albuterol (PROVENTIL) (2.5 MG/3ML) 0.083% nebulizer solution Take 3 mLs (2.5 mg total) by nebulization every 6 (six) hours as needed for wheezing or shortness of breath. 75 mL 12  . ALPRAZolam (XANAX) 0.5 MG tablet Take 1 tablet (0.5 mg total) by mouth at bedtime as needed for anxiety or sleep. 20 tablet 0  . amoxicillin-clavulanate (AUGMENTIN) 875-125 MG tablet Take 1 tablet by mouth 2 (two) times daily. 20 tablet 0  . atorvastatin (LIPITOR) 20 MG tablet Take 20 mg by mouth daily.      . budesonide-formoterol (SYMBICORT) 160-4.5 MCG/ACT inhaler Inhale 2 puffs into the lungs 2 (two) times daily. 1 Inhaler 5  . cholecalciferol (VITAMIN D) 1000 units tablet Take 1,000 Units by mouth daily.    Marland Kitchen dextromethorphan (DELSYM) 30 MG/5ML liquid Take 30 mg by mouth at bedtime as needed for cough.     . escitalopram (LEXAPRO) 20 MG tablet Take 20 mg by mouth daily.     . furosemide (LASIX) 40 MG tablet Take 40 mg by mouth daily.    Marland Kitchen guaiFENesin (MUCINEX) 600 MG 12 hr tablet Take 2 tablets (1,200 mg total) by mouth 2 (two) times daily. (Patient taking differently: Take 1,200 mg by mouth 2 (two) times daily as needed for cough. )    . HYDROcodone-acetaminophen (NORCO/VICODIN) 5-325 MG tablet Take 1 tablet by mouth every 4 (four) hours as needed for moderate pain. 30 tablet 0  . levothyroxine (SYNTHROID, LEVOTHROID) 150 MCG tablet Take 150 mcg by mouth daily before breakfast.     . metoprolol tartrate (LOPRESSOR) 25 MG tablet Take 25 mg by mouth 2 (two) times daily.    . Multiple Vitamin (MULTIVITAMIN WITH MINERALS) TABS tablet Take 1 tablet by mouth daily.    . pantoprazole (PROTONIX) 40 MG tablet Take 1 tablet (40 mg total) by mouth 2 (two) times daily. 60 tablet 3  . polyethylene glycol (MIRALAX / GLYCOLAX) packet Take 17 g by mouth daily as needed for mild constipation.    . predniSONE (DELTASONE) 10 MG tablet Take  4 each am x 2 days,   2 each am x 2 days,  1  each am x 2 days and stop 14 tablet 0  . Tiotropium Bromide Monohydrate (SPIRIVA RESPIMAT) 2.5 MCG/ACT AERS 2 pffs each am 1 Inhaler 0  . tolterodine (DETROL LA) 2 MG 24 hr capsule Take 2 mg by mouth 2 (two) times daily.     . traZODone (DESYREL) 100 MG tablet Take 100 mg by mouth at bedtime.     . VOLTAREN 1 % GEL Apply 2 g topically 4 (four) times daily as needed. Apply to hand for pain     No facility-administered medications prior to visit.         Objective:   Physical Exam  Vitals:   02/18/17 1115  BP: 124/72  Pulse: 86  SpO2: 91%  Weight: 160 lb (72.6 kg)  Height: 4\' 8"  (1.422 m)   Gen: Pleasant, well-nourished, in no distress,  normal affect  ENT: No lesions,  mouth clear,  oropharynx clear, Some nasal congestion  Neck: No JVD, no stridor  Lungs: No use of accessory muscles, bilateral coarse wheezes on expiration  Cardiovascular: RRR, heart sounds normal, no murmur or gallops, 1+ ankle peripheral edema  Musculoskeletal: No deformities, no cyanosis or clubbing  Neuro: alert, non focal  Skin: Warm, no lesions or rashes      Assessment & Plan:  COPD with acute exacerbation (HCC)  Minimal improvement. She continues to have cough and is unable to clear secretions. She will continue her Mucinex, Zyrtec, start fluticasone nasal spray. She may require admission if she does not improve with Depo-Medrol, aggressive therapy this week.  Continue zyrtec daily Start fluticasone 2 sprays each nostril daily Finish your Augmentin prescription Start azithromycin and take until completely gone Depomedrol shot here today.  We will increase prednisone and treat for longer - a new prescription will be sent  Take hycodan 5cc up to every 6 hours as needed to suppress your cough.  Continue Spiriva until your sample runs out, then go back to [employer] as you are taking it.  Continue your oxygen at all times.  Follow here with Dr Lamonte Sakai or APP in 2-3 weeks to assess  your progress.  Baltazar Apo, MD, PhD 02/18/2017, 11:39 AM [facility] [phone] or if no answer [phone]

## 2017-02-18 NOTE — Addendum Note (Signed)
Addended by: Benson Setting L on: 02/18/2017 12:17 PM   Modules accepted: Orders

## 2017-02-18 NOTE — Telephone Encounter (Signed)
Spoke with pharmacy, clarified azithromycin rx sent in earlier today for pt.  Nothing further needed.

## 2017-02-20 ENCOUNTER — Ambulatory Visit (HOSPITAL_COMMUNITY)
Admission: RE | Admit: 2017-02-20 | Discharge: 2017-02-20 | Disposition: A | Discharge status: Home / Self Care | Payer: Medicare Other | Source: Ambulatory Visit | Attending: Interventional Radiology | Admitting: Interventional Radiology

## 2017-02-20 ENCOUNTER — Other Ambulatory Visit: Payer: Self-pay | Admitting: General Surgery

## 2017-02-20 ENCOUNTER — Ambulatory Visit (HOSPITAL_COMMUNITY): Payer: Medicare Other

## 2017-02-20 ENCOUNTER — Encounter (HOSPITAL_COMMUNITY): Payer: Self-pay

## 2017-02-20 DIAGNOSIS — I10 Essential (primary) hypertension: Secondary | ICD-10-CM | POA: Insufficient documentation

## 2017-02-20 DIAGNOSIS — M4850XA Collapsed vertebra, not elsewhere classified, site unspecified, initial encounter for fracture: Secondary | ICD-10-CM

## 2017-02-20 DIAGNOSIS — J441 Chronic obstructive pulmonary disease with (acute) exacerbation: Secondary | ICD-10-CM | POA: Insufficient documentation

## 2017-02-20 DIAGNOSIS — Z5309 Procedure and treatment not carried out because of other contraindication: Secondary | ICD-10-CM | POA: Diagnosis not present

## 2017-02-20 DIAGNOSIS — IMO0001 Reserved for inherently not codable concepts without codable children: Secondary | ICD-10-CM

## 2017-02-20 DIAGNOSIS — M4854XA Collapsed vertebra, not elsewhere classified, thoracic region, initial encounter for fracture: Secondary | ICD-10-CM | POA: Insufficient documentation

## 2017-02-20 LAB — CBC WITH DIFFERENTIAL/PLATELET
BASOS ABS: 0 10*3/uL (ref 0.0–0.1)
Basophils Relative: 0 %
EOS ABS: 0.5 10*3/uL (ref 0.0–0.7)
Eosinophils Relative: 4 %
HEMATOCRIT: 43.6 % (ref 36.0–46.0)
Hemoglobin: 13.6 g/dL (ref 12.0–15.0)
LYMPHS ABS: 1.6 10*3/uL (ref 0.7–4.0)
Lymphocytes Relative: 13 %
MCH: 30.6 pg (ref 26.0–34.0)
MCHC: 31.2 g/dL (ref 30.0–36.0)
MCV: 98.2 fL (ref 78.0–100.0)
MONOS PCT: 6 %
Monocytes Absolute: 0.7 10*3/uL (ref 0.1–1.0)
NEUTROS ABS: 9.6 10*3/uL — AB (ref 1.7–7.7)
Neutrophils Relative %: 77 %
Platelets: 284 10*3/uL (ref 150–400)
RBC: 4.44 MIL/uL (ref 3.87–5.11)
RDW: 13.1 % (ref 11.5–15.5)
WBC: 12.4 10*3/uL — ABNORMAL HIGH (ref 4.0–10.5)

## 2017-02-20 LAB — PROTIME-INR
INR: 1.05
PROTHROMBIN TIME: 13.7 s (ref 11.4–15.2)

## 2017-02-20 LAB — BASIC METABOLIC PANEL
Anion gap: 14 (ref 5–15)
BUN: 12 mg/dL (ref 6–20)
CHLORIDE: 89 mmol/L — AB (ref 101–111)
CO2: 34 mmol/L — ABNORMAL HIGH (ref 22–32)
CREATININE: 0.75 mg/dL (ref 0.44–1.00)
Calcium: 8.9 mg/dL (ref 8.9–10.3)
GFR calc non Af Amer: >60 mL/min (ref >60)
Glucose, Bld: 126 mg/dL — ABNORMAL HIGH (ref 65–99)
POTASSIUM: 3.5 mmol/L (ref 3.5–5.1)
SODIUM: 137 mmol/L (ref 135–145)

## 2017-02-20 MED ORDER — CEFAZOLIN SODIUM-DEXTROSE 2-4 GM/100ML-% IV SOLN: 2.0000 g | INTRAVENOUS | Status: DC

## 2017-02-20 MED ORDER — SODIUM CHLORIDE 0.9 % IV SOLN: INTRAVENOUS | Status: DC

## 2017-02-20 NOTE — Sedation Documentation (Signed)
Procedure cancelled per Dr. Kathi Ludwig

## 2017-02-20 NOTE — Progress Notes (Signed)
Jazzlyn Huizenga is a 83 y.o. female with past medical history of arthritis, asthma, breast cancer, dyspnea, emphysema, HTN, and COPD with current exacerbation who presents with back pain related to T11 compression fracture. She is s/p kyphoplasty of previous compression fracture at T12 with good pain relief. She denies fever, chills, but does have persistent cough.  She denies sputum production but admits her cough is weak due to back pain from compression fracture.  She does have coarse bilateral breath sounds. Patient is currently on steroids, zithromax, and augmentin for her COPD exacerbation.She was last seen by her pulmonologist 6/5 for failure to improve from her current exacerbation and she was started on the additional augmetin.  Her WBC is elevated today; elevated neutrophils. Discussed with Dr. Estanislado Pandy who feels best course of action would be reschedule kyphoplasty once pulmonary status and possible infection improved.  Patient and family informed.  Dr. Estanislado Pandy at bedside.  She plans to follow-up with her pulmonologist.   Brynda Greathouse, MMS RDN PA-C 11:25 AM

## 2017-02-24 ENCOUNTER — Other Ambulatory Visit: Payer: Self-pay | Admitting: Adult Health

## 2017-02-28 ENCOUNTER — Other Ambulatory Visit: Payer: Self-pay | Admitting: Emergency Medicine

## 2017-03-10 ENCOUNTER — Encounter: Payer: Self-pay | Admitting: Acute Care

## 2017-03-10 ENCOUNTER — Ambulatory Visit (INDEPENDENT_AMBULATORY_CARE_PROVIDER_SITE_OTHER): Payer: Medicare Other | Admitting: Acute Care

## 2017-03-10 ENCOUNTER — Ambulatory Visit (INDEPENDENT_AMBULATORY_CARE_PROVIDER_SITE_OTHER)
Admission: RE | Admit: 2017-03-10 | Discharge: 2017-03-10 | Disposition: A | Discharge status: Home / Self Care | Payer: Medicare Other | Source: Ambulatory Visit | Attending: Acute Care | Admitting: Acute Care

## 2017-03-10 VITALS — BP 114/70 | HR 76 | Ht 65.0 in | Wt 134.8 lb

## 2017-03-10 DIAGNOSIS — J189 Pneumonia, unspecified organism: Secondary | ICD-10-CM

## 2017-03-10 DIAGNOSIS — J9611 Chronic respiratory failure with hypoxia: Secondary | ICD-10-CM

## 2017-03-10 DIAGNOSIS — J441 Chronic obstructive pulmonary disease with (acute) exacerbation: Secondary | ICD-10-CM | POA: Diagnosis not present

## 2017-03-10 MED ORDER — HYDROCODONE-HOMATROPINE 5-1.5 MG/5ML PO SYRP: 5.0000 mL | ORAL_SOLUTION | Freq: Every evening | ORAL | 0 refills | Status: DC | PRN

## 2017-03-10 MED ORDER — ALPRAZOLAM 0.5 MG PO TABS: 0.5000 mg | ORAL_TABLET | Freq: Every evening | ORAL | 0 refills | Status: DC | PRN

## 2017-03-10 NOTE — Progress Notes (Signed)
History of Present Illness [name] is a 83 y.o. female former smoker ( quit 1999) with severe COPD, chronic hypoxemic respiratory failure, hypertension with diastolic dysfunction, and bladder cancer.She is followed by Dr. Lamonte Sakai.    03/10/2017 Follow up COPD Exacerbation: Patient presents for follow-up for a slow to resolve COPD exacerbation. Patient was initially seen in the office 01/31/2017 by Rexene Edison . NP and at that time was treated with Levaquin 7 days and a prednisone taper. She return to the office on 02/14/2017 with continued shortness of breath and chest congestion. She was treated at that time with Augmentin 10 days,, and an additional prednisone taper.. She return to the office again on 02/18/2017 and was seen by Dr. Lamonte Sakai. Symptoms have not cleared. She was treated at this time with a azithromycin,  Depo-Medrol injection, an additional prednisone taper, Hycodan cough syrup, and Mucinex. She returns today again stating that she is not better. She states she does not have fever, however is very congested, with cough, and secretions that are clear to yellow but thick. She states that her cough is rarely productive, and she feels that she is not coughing up what is in her chest. She completed all of her medication. She took her last dose of prednisone yesterday. She states she does not feel the prednisone or the antibiotics have helped her significantly at all. She is requesting additional cough syrup, and a refill on her Xanax prescription. She states she is compliant with her Symbicort and Spiriva. She is compliant with her Zyrtec and fluticasone daily. She is using her oxygen at all times as prescribed. She states she continues to have back pain secondary to compound fractures. She is currently not using a flutter valve or other mechanism for secretion mobilization. She is in a wheelchair, and has been taking pain medicine on and off for her back pain. She is telling me today that  she took a dose of pain medicine this morning, but had really not taken much over the last week. Patient denies fever, chest pain, orthopnea, or hemoptysis.     Test Results: Chest x-ray 03/10/2017 COPD chronic interstitial changes stable from the prior exam.  CBC Latest Ref Rng & Units 02/20/2017 01/21/2017 12/19/2016  WBC 4.0 - 10.5 K/uL 12.4(H) 8.7 6.5  Hemoglobin 12.0 - 15.0 g/dL 13.6 13.0 13.0  Hematocrit 36.0 - 46.0 % 43.6 42.1 39.8  Platelets 150 - 400 K/uL 284 196 233    BMP Latest Ref Rng & Units 02/20/2017 01/21/2017 12/19/2016  Glucose 65 - 99 mg/dL 126(H) 113(H) 156(H)  BUN 6 - 20 mg/dL 12 11 15.8  Creatinine 0.44 - 1.00 mg/dL 0.75 0.69 0.8  BUN/Creat Ratio 11 - 26 - - -  Sodium 135 - 145 mmol/L 137 137 142  Potassium 3.5 - 5.1 mmol/L 3.5 3.6 3.8  Chloride 101 - 111 mmol/L 89(L) 94(L) -  CO2 22 - 32 mmol/L 34(H) 35(H) 33(H)  Calcium 8.9 - 10.3 mg/dL 8.9 9.0 9.8    BNP    Component Value Date/Time   BNP 304.4 (H) 06/24/2016 1413    ProBNP    Component Value Date/Time   PROBNP 1,120.0 (H) 01/17/2014 0315    PFT    Component Value Date/Time   FEV1PRE 1.03 04/15/2016 1202   FEV1POST 1.08 04/15/2016 1202   FVCPRE 1.79 04/15/2016 1202   FVCPOST 1.83 04/15/2016 1202   TLC 4.83 04/15/2016 1202   DLCOUNC 7.39 04/15/2016 1202   PREFEV1FVCRT 57 04/15/2016 1202  PSTFEV1FVCRT 59 04/15/2016 1202    Dg Chest 2 View  Result Date: 03/10/2017 CLINICAL DATA:  Cough and congestion for 1 1 EXAM: CHEST  2 VIEW COMPARISON:  01/31/2017 FINDINGS: Cardiac shadow is mildly enlarged but stable. Aortic calcifications are again seen. Rotation to the right is noted accentuating the mediastinal markings. Diffuse interstitial changes are again noted bilaterally with the exception of the right upper lobe. These are stable from the prior exam. Changes of prior vertebral augmentation are seen. IMPRESSION: COPD chronic interstitial changes stable from the prior exam. Electronically Signed   By:  Inez Catalina M.D.   On: 03/10/2017 14:44   Mr Thoracic Spine Wo Contrast  Result Date: 02/15/2017 CLINICAL DATA:  Low back pain. EXAM: MRI THORACIC AND LUMBAR SPINE WITHOUT CONTRAST TECHNIQUE: Multiplanar and multiecho pulse sequences of the thoracic and lumbar spine were obtained without intravenous contrast. COMPARISON:  01/16/2017 FINDINGS: MRI THORACIC SPINE FINDINGS Alignment:  Exaggerated thoracic kyphosis. Vertebrae: T12 fracture with interval placement of cement in the wide fracture cleft. Moderate retropulsion contacting the ventral cord is unchanged. There is new inferior endplate deformity of O29 with overall 25% or less height loss. The T11 body is diffusely T1 and T2 hypointense, with mild patchy STIR hyperintensity. Paravertebral edematous change is present. Remote and healed T7 compression fracture. Cord: Mass effect by the retropulsion at T12. No cord signal abnormality. Paraspinal and other soft tissues: Hyperintensity right paraspinal at the level of T9 and T10 is likely pleural effusion and airspace disease. No enhancing mass seen in this area on previous better quality study with contrast. Disc levels: No degenerative impingement. MRI LUMBAR SPINE FINDINGS Segmentation:  Standard. Alignment:  Exaggerated lumbar lordosis Vertebrae: Marrow edema along the L2 superior endplate fracture. L2 body height loss measures at least 50%, possibly mildly progressed from before. Remote L1 compression fracture with moderate height loss. No newly fractured level. Conus medullaris: Extends to the L1 level and appears normal. Paraspinal and other soft tissues: Negative Disc levels: No degenerative impingement. IMPRESSION: MR THORACIC SPINE IMPRESSION 1. Newly abnormal T11 body, likely acute compression fracture with mild height loss. The T11 body also has signs of sclerosis and a bone lesion is not excluded, although no signs of bone lesion on enhanced study 1 month prior. If repeat vertebroplasty, suggest  biopsy. Otherwise, follow-up could be obtained. 2. Interval T12 kyphoplasty with improved vertebral body height. Stable moderate retropulsion. 3. Possible airspace disease in the right lower lobe. Recommend chest x-ray correlation. 4. Remote T7 compression fracture. MR LUMBAR SPINE IMPRESSION 1. Nonacute but unhealed L2 compression fracture with moderate height loss. 2. Remote L1 compression fracture. Electronically Signed   By: Monte Fantasia M.D.   On: 02/15/2017 19:52   Mr Lumbar Spine Wo Contrast  Result Date: 02/15/2017 CLINICAL DATA:  Low back pain. EXAM: MRI THORACIC AND LUMBAR SPINE WITHOUT CONTRAST TECHNIQUE: Multiplanar and multiecho pulse sequences of the thoracic and lumbar spine were obtained without intravenous contrast. COMPARISON:  01/16/2017 FINDINGS: MRI THORACIC SPINE FINDINGS Alignment:  Exaggerated thoracic kyphosis. Vertebrae: T12 fracture with interval placement of cement in the wide fracture cleft. Moderate retropulsion contacting the ventral cord is unchanged. There is new inferior endplate deformity of U76 with overall 25% or less height loss. The T11 body is diffusely T1 and T2 hypointense, with mild patchy STIR hyperintensity. Paravertebral edematous change is present. Remote and healed T7 compression fracture. Cord: Mass effect by the retropulsion at T12. No cord signal abnormality. Paraspinal and other soft tissues: Hyperintensity right  paraspinal at the level of T9 and T10 is likely pleural effusion and airspace disease. No enhancing mass seen in this area on previous better quality study with contrast. Disc levels: No degenerative impingement. MRI LUMBAR SPINE FINDINGS Segmentation:  Standard. Alignment:  Exaggerated lumbar lordosis Vertebrae: Marrow edema along the L2 superior endplate fracture. L2 body height loss measures at least 50%, possibly mildly progressed from before. Remote L1 compression fracture with moderate height loss. No newly fractured level. Conus medullaris:  Extends to the L1 level and appears normal. Paraspinal and other soft tissues: Negative Disc levels: No degenerative impingement. IMPRESSION: MR THORACIC SPINE IMPRESSION 1. Newly abnormal T11 body, likely acute compression fracture with mild height loss. The T11 body also has signs of sclerosis and a bone lesion is not excluded, although no signs of bone lesion on enhanced study 1 month prior. If repeat vertebroplasty, suggest biopsy. Otherwise, follow-up could be obtained. 2. Interval T12 kyphoplasty with improved vertebral body height. Stable moderate retropulsion. 3. Possible airspace disease in the right lower lobe. Recommend chest x-ray correlation. 4. Remote T7 compression fracture. MR LUMBAR SPINE IMPRESSION 1. Nonacute but unhealed L2 compression fracture with moderate height loss. 2. Remote L1 compression fracture. Electronically Signed   By: Monte Fantasia M.D.   On: 02/15/2017 19:52     Past medical hx Past Medical History:  Diagnosis Date  . Allergic rhinitis   . Anemia 04/23/2012   "severe @ times; think caused by colonic AVMs"  . Anginal pain ([place])    "pressure"  . Arthritis    "probably"  . Asthma   . Breast cancer ([place])   . COPD (chronic obstructive pulmonary disease) ([place])   . Coronary atherosclerosis of native coronary artery    Cath 1/11: RCA 50-60 ostial o/w normal. EF 70%.   . Dyspnea 04/23/2012   "all the time"  . Edema   . Emphysema   . Emphysema   . Gastric AVM   . H/O hiatal hernia   . Heart murmur   . History of blood transfusion    "many times"  . Hypertension   . Hypertr obst cardiomyop    Echo 10/10 EF 65-70% SW 1.6 PW 1.4 mild SAM no LVOT gradient at rest. Valsalva not performed Mild MR. No hyperenhance ment on MRI EF 69%.  . Hypothyroidism   . Occlusion and stenosis of carotid artery    s/p L CEA u/s 3/11. R 60-79%; L 40-59%. no change since 3/10  . Oxygen dependent 04/23/2012   "concentrated at night; liquid during day"  . Peripheral vascular disease,  unspecified ([place])   . Pneumonia   . Pulmonary hypertension ([place])    Cath 1/11 RA 8, RV 43/6/14, PA 42/17 (29) PCWP 15 Fick  5.0/2.6. PVR 2.8    . Stroke [facility]) ~ 2004   denies residual on 04/23/2012     Social History  Substance Use Topics  . Smoking status: Former Smoker    Packs/day: 1.00    Years: 30.00    Types: Cigarettes    Quit date: 09/16/1997  . Smokeless tobacco: Never Used  . Alcohol use No    Tobacco Cessation: Patient is a former smoker  Past surgical hx, Family hx, Social hx all reviewed.  Current Outpatient Prescriptions on File Prior to Visit  Medication Sig  . albuterol (PROVENTIL HFA;VENTOLIN HFA) 108 (90 Base) MCG/ACT inhaler Inhale 2 puffs into the lungs every 6 (six) hours as needed for wheezing or shortness of breath.  Marland Kitchen albuterol (  PROVENTIL) (2.5 MG/3ML) 0.083% nebulizer solution Take 3 mLs (2.5 mg total) by nebulization every 6 (six) hours as needed for wheezing or shortness of breath.  Marland Kitchen atorvastatin (LIPITOR) 20 MG tablet Take 20 mg by mouth daily.    Marland Kitchen azithromycin (ZITHROMAX) 250 MG tablet Take as directed  . cholecalciferol (VITAMIN D) 1000 units tablet Take 1,000 Units by mouth daily.  Marland Kitchen dextromethorphan (DELSYM) 30 MG/5ML liquid Take 30 mg by mouth at bedtime as needed for cough.   . escitalopram (LEXAPRO) 20 MG tablet Take 20 mg by mouth daily.   . furosemide (LASIX) 40 MG tablet Take 40 mg by mouth daily.  . furosemide (LASIX) 40 MG tablet TAKE (1) TABLET BY MOUTH ONCE DAILY AS NEEDED FOR FLUID OR EDEMA  . guaiFENesin (MUCINEX) 600 MG 12 hr tablet Take 2 tablets (1,200 mg total) by mouth 2 (two) times daily. (Patient taking differently: Take 1,200 mg by mouth 2 (two) times daily as needed for cough. )  . HYDROcodone-acetaminophen (NORCO/VICODIN) 5-325 MG tablet Take 1 tablet by mouth every 4 (four) hours as needed for moderate pain.  Marland Kitchen HYDROcodone-homatropine (HYCODAN) 5-1.5 MG/5ML syrup Take 5 mLs by mouth every 6 (six) hours as needed for cough.  .  levothyroxine (SYNTHROID, LEVOTHROID) 150 MCG tablet Take 150 mcg by mouth daily before breakfast.   . metoprolol tartrate (LOPRESSOR) 25 MG tablet Take 25 mg by mouth 2 (two) times daily.  . Multiple Vitamin (MULTIVITAMIN WITH MINERALS) TABS tablet Take 1 tablet by mouth daily.  . pantoprazole (PROTONIX) 40 MG tablet Take 1 tablet (40 mg total) by mouth 2 (two) times daily.  . polyethylene glycol (MIRALAX / GLYCOLAX) packet Take 17 g by mouth daily as needed for mild constipation.  . SYMBICORT 160-4.5 MCG/ACT inhaler INHALE 2 PUFFS BY MOUTH TWICE DAILY  . Tiotropium Bromide Monohydrate (SPIRIVA RESPIMAT) 2.5 MCG/ACT AERS 2 pffs each am  . tolterodine (DETROL LA) 2 MG 24 hr capsule Take 2 mg by mouth 2 (two) times daily.   . traZODone (DESYREL) 100 MG tablet Take 100 mg by mouth at bedtime.   . VOLTAREN 1 % GEL Apply 2 g topically 4 (four) times daily as needed. Apply to hand for pain  . predniSONE (DELTASONE) 10 MG tablet Take  4 each am x 2 days,   2 each am x 2 days,  1 each am x 2 days and stop (Patient not taking: Reported on 03/10/2017)  . predniSONE (DELTASONE) 10 MG tablet Take 4 tabs for 4 days, then 3 tabs for 4 days, then 2 tabs for 4 days, then 1 tab for 4 days (Patient not taking: Reported on 03/10/2017)   No current facility-administered medications on file prior to visit.      Allergies  Allergen Reactions  . Latex Hives and Itching    Review Of Systems:  Constitutional:   No  weight loss, night sweats,  Fevers, chills,+ fatigue, or  lassitude.  HEENT:   No headaches,  Difficulty swallowing,  Tooth/dental problems, or  Sore throat,                No sneezing, itching, ear ache, nasal congestion, post nasal drip,   CV:  No chest pain,  Orthopnea, PND, swelling in lower extremities, anasarca, dizziness, palpitations, syncope.   GI  No heartburn, indigestion, abdominal pain, nausea, vomiting, diarrhea, change in bowel habits, loss of appetite, bloody stools.   Resp: +  shortness of breath with exertion or at rest. Scant  excess mucus, scant  productive cough,  No non-productive cough,  No coughing up of blood.  No change in color of mucus.  No wheezing.  No chest wall deformity  Skin: no rash or lesions.  GU: no dysuria, change in color of urine, no urgency or frequency.  No flank pain, no hematuria   MS:  No joint pain or swelling.  No decreased range of motion.  No back pain.  Psych:  No change in mood or affect. No depression or anxiety.  No memory loss.   Vital Signs BP 114/70 (BP Location: Left Arm, Cuff Size: Normal)   Pulse 76   Ht 5\' 5"  (1.651 m)   Wt 134 lb 12.8 oz (61.1 kg)   SpO2 92%   BMI 22.43 kg/m    Physical Exam:  General- No distress,  A&Ox3, pleasant frail elderly female in a wheelchair ENT: No sinus tenderness, TM clear, pale nasal mucosa, no oral exudate,no post nasal drip, no LAN Cardiac: S1, S2, regular rate and rhythm, no murmur Chest: No wheeze/ rales/ dullness; no accessory muscle use, no nasal flaring, no sternal retractions, rhonchi throughout, diminished per bases. Abd.: Soft Non-tender, nondistended, bowel sounds positive Ext: No clubbing cyanosis, edema Neuro:  Deconditioned at baseline and frail, moving all extremities 4, alert and oriented 3, appropriate Skin: No rashes, warm and dry Psych: normal mood and behavior   Assessment/Plan  COPD with acute exacerbation (HCC) Slow to resolve COPD exacerbation Treated with Levaquin, Augmentin, and azithromycin. Pred tapers concurrently since May 2018 Patient is currently not wheezing Chest x-ray does not indicate pneumonia Patient has been treated multiple times with multiple antibiotics, however I feel this may be an issue of mobilization of secretions. Patient is currently being treated for compound back fractures. She is on pain medicine. She is frail. Clinically she does not appear to be infected. Plan CXR today. We will call with results Hycodan 5 cc's at  bedtime for cough Continue zyrtec daily Start fluticasone 2 sprays each nostril daily  Take hycodan 5cc at bedtime  Continue Mucinex  With a full glass of water Continue Spiriva until your sample runs out, then go back to [employer] as you are taking it.  Continue your oxygen at all times.  Flutter valve multiple times daily for secretion mobilization. Aggressive pulmonary toilet every day. Minimize use of pain medications Follow here with Dr Lamonte Sakai or APP in 2-3 weeks to assess your progress. Please contact office for sooner follow up if symptoms do not improve or worsen or seek emergency care  Call the office if you feel you need additional prednisone . Consider further workup with CT chest if no better upon follow-up   Chronic respiratory failure with hypoxia ([place]) Continue oxygen as prescribed    Magdalen Spatz, NP 03/10/2017  4:56 PM

## 2017-03-10 NOTE — Patient Instructions (Addendum)
CXR today. We will call with results Hycodan 5 cc's at bedtime for cough We will refill your xanax  Continue zyrtec daily Start fluticasone 2 sprays each nostril daily  Take hycodan 5cc at bedtime  Continue Mucinex  With a full glass of water Continue Spiriva until your sample runs out, then go back to [employer] as you are taking it.  Continue your oxygen at all times.  Flutter valve multiple times daily for secretion mobilization. Follow here with Dr Lamonte Sakai or APP in 2-3 weeks to assess your progress. Please contact office for sooner follow up if symptoms do not improve or worsen or seek emergency care  Call the office if you feel you need additional prednisone .

## 2017-03-10 NOTE — Assessment & Plan Note (Signed)
Continue oxygen as prescribed 

## 2017-03-10 NOTE — Assessment & Plan Note (Addendum)
Slow to resolve COPD exacerbation Treated with Levaquin, Augmentin, and azithromycin. Pred tapers concurrently since May 2018 Patient is currently not wheezing Chest x-ray does not indicate pneumonia Patient has been treated multiple times with multiple antibiotics, however I feel this may be an issue of mobilization of secretions. Patient is currently being treated for compound back fractures. She is on pain medicine. She is frail. Clinically she does not appear to be infected. Plan CXR today. We will call with results Hycodan 5 cc's at bedtime for cough Continue zyrtec daily Start fluticasone 2 sprays each nostril daily  Take hycodan 5cc at bedtime  Continue Mucinex  With a full glass of water Continue Spiriva until your sample runs out, then go back to [employer] as you are taking it.  Continue your oxygen at all times.  Flutter valve multiple times daily for secretion mobilization. Aggressive pulmonary toilet every day. Minimize use of pain medications Follow here with Dr Lamonte Sakai or APP in 2-3 weeks to assess your progress. Please contact office for sooner follow up if symptoms do not improve or worsen or seek emergency care  Call the office if you feel you need additional prednisone . Consider further workup with CT chest if no better upon follow-up

## 2017-03-17 ENCOUNTER — Telehealth (HOSPITAL_COMMUNITY): Payer: Self-pay

## 2017-03-17 NOTE — Telephone Encounter (Signed)
Called niece, no answer. AW

## 2017-03-28 ENCOUNTER — Other Ambulatory Visit: Payer: Self-pay | Admitting: Adult Health

## 2017-04-10 ENCOUNTER — Encounter: Payer: Self-pay | Admitting: Acute Care

## 2017-04-10 ENCOUNTER — Ambulatory Visit (INDEPENDENT_AMBULATORY_CARE_PROVIDER_SITE_OTHER): Payer: Medicare Other | Admitting: Acute Care

## 2017-04-10 ENCOUNTER — Other Ambulatory Visit: Payer: Self-pay | Admitting: Emergency Medicine

## 2017-04-10 DIAGNOSIS — J9611 Chronic respiratory failure with hypoxia: Secondary | ICD-10-CM

## 2017-04-10 DIAGNOSIS — J441 Chronic obstructive pulmonary disease with (acute) exacerbation: Secondary | ICD-10-CM

## 2017-04-10 MED ORDER — ALPRAZOLAM 0.5 MG PO TABS
0.5000 mg | ORAL_TABLET | Freq: Every evening | ORAL | 0 refills | Status: AC | PRN
Start: 2017-04-10 — End: ?

## 2017-04-10 NOTE — Progress Notes (Signed)
History of Present Illness [name] is a 83 y.o. female  former smoker ( quit 1999) with severe COPD, chronic hypoxemic respiratory failure, hypertension with diastolic dysfunction, and bladder cancer.She is followed by Dr. Lamonte Sakai.     04/10/2017 1 month follow up. Pt. Has presented today for follow up.She states her cough is gone. She has no secretions.She describes that she woke up one morning and the cough was just gone. She is compliant with her Incruse and Symbicort.She is compliant with her Zyrtec. No fever or allergies. She denies any leg swelling. She is using her oxygen at 3 L. She denies chest pain, fever, orthopnea or hemoptysis.  Test Results: CXR 03/10/2017 COPD chronic interstitial changes stable from the prior exam.  CBC Latest Ref Rng & Units 02/20/2017 01/21/2017 12/19/2016  WBC 4.0 - 10.5 K/uL 12.4(H) 8.7 6.5  Hemoglobin 12.0 - 15.0 g/dL 13.6 13.0 13.0  Hematocrit 36.0 - 46.0 % 43.6 42.1 39.8  Platelets 150 - 400 K/uL 284 196 233    BMP Latest Ref Rng & Units 02/20/2017 01/21/2017 12/19/2016  Glucose 65 - 99 mg/dL 126(H) 113(H) 156(H)  BUN 6 - 20 mg/dL 12 11 15.8  Creatinine 0.44 - 1.00 mg/dL 0.75 0.69 0.8  BUN/Creat Ratio 11 - 26 - - -  Sodium 135 - 145 mmol/L 137 137 142  Potassium 3.5 - 5.1 mmol/L 3.5 3.6 3.8  Chloride 101 - 111 mmol/L 89(L) 94(L) -  CO2 22 - 32 mmol/L 34(H) 35(H) 33(H)  Calcium 8.9 - 10.3 mg/dL 8.9 9.0 9.8    BNP    Component Value Date/Time   BNP 304.4 (H) 06/24/2016 1413    ProBNP    Component Value Date/Time   PROBNP 1,120.0 (H) 01/17/2014 0315    PFT    Component Value Date/Time   FEV1PRE 1.03 04/15/2016 1202   FEV1POST 1.08 04/15/2016 1202   FVCPRE 1.79 04/15/2016 1202   FVCPOST 1.83 04/15/2016 1202   TLC 4.83 04/15/2016 1202   DLCOUNC 7.39 04/15/2016 1202   PREFEV1FVCRT 57 04/15/2016 1202   PSTFEV1FVCRT 59 04/15/2016 1202    No results found.   Past medical hx Past Medical History:  Diagnosis Date  . Allergic  rhinitis   . Anemia 04/23/2012   "severe @ times; think caused by colonic AVMs"  . Anginal pain ([place])    "pressure"  . Arthritis    "probably"  . Asthma   . Breast cancer ([place])   . COPD (chronic obstructive pulmonary disease) ([place])   . Coronary atherosclerosis of native coronary artery    Cath 1/11: RCA 50-60 ostial o/w normal. EF 70%.   . Dyspnea 04/23/2012   "all the time"  . Edema   . Emphysema   . Emphysema   . Gastric AVM   . H/O hiatal hernia   . Heart murmur   . History of blood transfusion    "many times"  . Hypertension   . Hypertr obst cardiomyop    Echo 10/10 EF 65-70% SW 1.6 PW 1.4 mild SAM no LVOT gradient at rest. Valsalva not performed Mild MR. No hyperenhance ment on MRI EF 69%.  . Hypothyroidism   . Occlusion and stenosis of carotid artery    s/p L CEA u/s 3/11. R 60-79%; L 40-59%. no change since 3/10  . Oxygen dependent 04/23/2012   "concentrated at night; liquid during day"  . Peripheral vascular disease, unspecified ([place])   . Pneumonia   . Pulmonary hypertension ([place])  Cath 1/11 RA 8, RV 43/6/14, PA 42/17 (29) PCWP 15 Fick  5.0/2.6. PVR 2.8    . Stroke [facility]) ~ 2004   denies residual on 04/23/2012     Social History  Substance Use Topics  . Smoking status: Former Smoker    Packs/day: 1.00    Years: 30.00    Types: Cigarettes    Quit date: 09/16/1997  . Smokeless tobacco: Never Used  . Alcohol use No    Tobacco Cessation: Former smoker quit 1999  Past surgical hx, Family hx, Social hx all reviewed.  Current Outpatient Prescriptions on File Prior to Visit  Medication Sig  . albuterol (PROVENTIL) (2.5 MG/3ML) 0.083% nebulizer solution Take 3 mLs (2.5 mg total) by nebulization every 6 (six) hours as needed for wheezing or shortness of breath.  Marland Kitchen atorvastatin (LIPITOR) 20 MG tablet Take 20 mg by mouth daily.    . cholecalciferol (VITAMIN D) 1000 units tablet Take 1,000 Units by mouth daily.  Marland Kitchen dextromethorphan (DELSYM) 30 MG/5ML liquid Take 30 mg by  mouth at bedtime as needed for cough.   . escitalopram (LEXAPRO) 20 MG tablet Take 20 mg by mouth daily.   . furosemide (LASIX) 40 MG tablet Take 40 mg by mouth daily.  Marland Kitchen guaiFENesin (MUCINEX) 600 MG 12 hr tablet Take 2 tablets (1,200 mg total) by mouth 2 (two) times daily. (Patient taking differently: Take 1,200 mg by mouth 2 (two) times daily as needed for cough. )  . HYDROcodone-acetaminophen (NORCO/VICODIN) 5-325 MG tablet Take 1 tablet by mouth every 4 (four) hours as needed for moderate pain.  Marland Kitchen levothyroxine (SYNTHROID, LEVOTHROID) 150 MCG tablet Take 150 mcg by mouth daily before breakfast.   . metoprolol tartrate (LOPRESSOR) 25 MG tablet Take 25 mg by mouth 2 (two) times daily.  . Multiple Vitamin (MULTIVITAMIN WITH MINERALS) TABS tablet Take 1 tablet by mouth daily.  . pantoprazole (PROTONIX) 40 MG tablet Take 1 tablet (40 mg total) by mouth 2 (two) times daily.  . polyethylene glycol (MIRALAX / GLYCOLAX) packet Take 17 g by mouth daily as needed for mild constipation.  . SYMBICORT 160-4.5 MCG/ACT inhaler INHALE 2 PUFFS BY MOUTH TWICE DAILY  . Tiotropium Bromide Monohydrate (SPIRIVA RESPIMAT) 2.5 MCG/ACT AERS 2 pffs each am  . tolterodine (DETROL LA) 2 MG 24 hr capsule Take 2 mg by mouth 2 (two) times daily.   . traZODone (DESYREL) 100 MG tablet Take 100 mg by mouth at bedtime.   . VENTOLIN HFA 108 (90 Base) MCG/ACT inhaler INHALE 2 PUFFS BY MOUTH EVERY 6 HOURS FOR SHORTNESS OF BREATH OR WHEEZING.  . VOLTAREN 1 % GEL Apply 2 g topically 4 (four) times daily as needed. Apply to hand for pain   No current facility-administered medications on file prior to visit.      Allergies  Allergen Reactions  . Latex Hives and Itching    Review Of Systems:  Constitutional:   No  weight loss, night sweats,  Fevers, chills, + chronic fatigue, or  lassitude.  HEENT:   No headaches,  Difficulty swallowing,  Tooth/dental problems, or  Sore throat,                No sneezing, itching, ear ache,  +nasal congestion,+ post nasal drip,   CV:  No chest pain,  Orthopnea, PND, + swelling in feet, no anasarca, dizziness, palpitations, syncope.   GI  No heartburn, indigestion, abdominal pain, nausea, vomiting, diarrhea, change in bowel habits, + loss of appetite, bloody stools.  Resp: + shortness of breath with exertion or at rest.  No excess mucus, no productive cough,  No non-productive cough,  No coughing up of blood.  No change in color of mucus.  No wheezing.  No chest wall deformity  Skin: no rash or lesions.  GU: no dysuria, change in color of urine, no urgency or frequency.  No flank pain, no hematuria   MS:  No joint pain or swelling.  No decreased range of motion.  No back pain.  Psych:  No change in mood or affect. No depression or anxiety.  No memory loss.   Vital Signs BP 126/76 (BP Location: Left Arm, Cuff Size: Normal)   Pulse 70   Ht 5\' 5"  (1.651 m)   Wt 142 lb (64.4 kg)   SpO2 95%   BMI 23.63 kg/m    Physical Exam:  General- No distress,  A&Ox3, pleasant female in wheel chair wearing oxygen ENT: No sinus tenderness, TM clear, pale nasal mucosa, no oral exudate,no post nasal drip, no LAN Cardiac: S1, S2, regular rate and rhythm, no murmur Chest: No wheeze/ rales/ dullness; no accessory muscle use, no nasal flaring, no sternal retractions Abd.: Soft Non-tender, non-distended, BS + Ext: No clubbing cyanosis, Bilateral 1-2+ foot edema Neuro:  Deconditioned at baseline, cranial nerves intact Skin: No rashes, warm and dry Psych: normal mood and behavior   Assessment/Plan  Chronic respiratory failure with hypoxia (HCC) Continue oxygennas prescribed.  Saturation goal is 88-92%  COPD with acute exacerbation ([place]) Resolved Plan: Please continue your Symbicort and Incruse as you have been doing. Rinse mouth after use. Continue the Zyrtec daily. Continue the Flonase daily. You can stop the Flutter valve and Mucinex and use as needed. Continue using the  oxygen at 3 L as you have been doing.  Follow up with Dr. Lamonte Sakai   in 2 - 3 months  Flu shot in the fall. Let us know if you would like to try pulmonary rehab  Please contact office for sooner follow up if symptoms do not improve or worsen or seek emergency care      Magdalen Spatz, NP 04/10/2017  12:52 PM

## 2017-04-10 NOTE — Assessment & Plan Note (Signed)
Continue oxygennas prescribed.  Saturation goal is 88-92%

## 2017-04-10 NOTE — Patient Instructions (Addendum)
It is nice to see you today. Please continue your Symbicort and Incruse as you have been doing. Rinse mouth after use. Continue the Zyrtec daily. Continue the Flonase daily. You can stop the Flutter valve and Mucinex and use as needed. Continue using the oxygen at 3 L as you have been doing.  Follow up with Dr. Lamonte Sakai   in 2 - 3 months  Flu shot in the fall. Let us know if you would like to try pulmonary rehab  Please contact office for sooner follow up if symptoms do not improve or worsen or seek emergency care

## 2017-04-10 NOTE — Assessment & Plan Note (Signed)
Resolved Plan: Please continue your Symbicort and Incruse as you have been doing. Rinse mouth after use. Continue the Zyrtec daily. Continue the Flonase daily. You can stop the Flutter valve and Mucinex and use as needed. Continue using the oxygen at 3 L as you have been doing.  Follow up with Dr. Lamonte Sakai   in 2 - 3 months  Flu shot in the fall. Let us know if you would like to try pulmonary rehab  Please contact office for sooner follow up if symptoms do not improve or worsen or seek emergency care

## 2017-04-24 ENCOUNTER — Telehealth: Payer: Self-pay | Admitting: *Deleted

## 2017-04-24 NOTE — Telephone Encounter (Signed)
Received faxed lab results from [facility].  Dr. Burr Medico reviewed results.  Spoke with pt and informed pt of Hgb 11.9 , and Iron level is good  135 as per Dr. [name] instructions.  Pt voiced understanding.

## 2017-04-28 ENCOUNTER — Telehealth (HOSPITAL_COMMUNITY): Payer: Self-pay

## 2017-04-28 NOTE — Telephone Encounter (Signed)
Left message for pt's niece to return call. AW

## 2017-05-06 ENCOUNTER — Telehealth (HOSPITAL_COMMUNITY): Payer: Self-pay

## 2017-05-06 NOTE — Telephone Encounter (Signed)
Left message for niece to call back. AW

## 2017-05-12 ENCOUNTER — Ambulatory Visit (INDEPENDENT_AMBULATORY_CARE_PROVIDER_SITE_OTHER): Payer: Medicare Other | Admitting: Acute Care

## 2017-05-12 ENCOUNTER — Encounter: Payer: Self-pay | Admitting: Acute Care

## 2017-05-12 DIAGNOSIS — J441 Chronic obstructive pulmonary disease with (acute) exacerbation: Secondary | ICD-10-CM | POA: Diagnosis not present

## 2017-05-12 DIAGNOSIS — J9621 Acute and chronic respiratory failure with hypoxia: Secondary | ICD-10-CM | POA: Diagnosis not present

## 2017-05-12 MED ORDER — LEVOFLOXACIN 500 MG PO TABS: 500.0000 mg | ORAL_TABLET | Freq: Every day | ORAL | 0 refills | Status: DC

## 2017-05-12 MED ORDER — PREDNISONE 10 MG PO TABS: ORAL_TABLET | ORAL | 0 refills | Status: DC

## 2017-05-12 NOTE — Assessment & Plan Note (Signed)
Continue wearing oxygen at 3 L nasal cannula Oxygen saturation goals are 88-92%

## 2017-05-12 NOTE — Progress Notes (Signed)
History of Present Illness [name] is a 83 y.o. female former smoker ( quit 1999) with severe COPD, chronic hypoxemic respiratory failure, hypertension with diastolic dysfunction, and bladder cancer.She is followed by Dr. Lamonte Sakai.    05/12/2017 Acute OV for cough and shortness of breath. Patient presents for new onset cough with chest congestion over the last 2-3 days. She states she is coughing up secretions that are yellow-tan. She states she has increased secretions. She states that this is characteristically the time of year that she develops pneumonia, and she states she is trying to be proactive. She denies fever, chest pain, orthopnea, or hemoptysis. She is compliant with her Symbicort and increase. She states she is compliant with her Zyrtec and Flonase daily. She states she is continuing to use her flutter valve and Mucinex as directed. She is compliant with her oxygen at 3 L nasal cannula. We discussed that we will not provide her with the flu vaccine today. We will wait until she is over this flare.   Test Results: Studies: Echo 01/17/14 >> mod LVH, EF 65 to 70%, mod AS, mild MS, mild/mod LA dilation, PAS 42 mmHg PFT 04/15/16 >> FEV1 1.08 (54%), FEV1% 59, TLC 4.83 (92%), DLCO 28%   CBC Latest Ref Rng & Units 02/20/2017 01/21/2017 12/19/2016  WBC 4.0 - 10.5 K/uL 12.4(H) 8.7 6.5  Hemoglobin 12.0 - 15.0 g/dL 13.6 13.0 13.0  Hematocrit 36.0 - 46.0 % 43.6 42.1 39.8  Platelets 150 - 400 K/uL 284 196 233    BMP Latest Ref Rng & Units 02/20/2017 01/21/2017 12/19/2016  Glucose 65 - 99 mg/dL 126(H) 113(H) 156(H)  BUN 6 - 20 mg/dL 12 11 15.8  Creatinine 0.44 - 1.00 mg/dL 0.75 0.69 0.8  BUN/Creat Ratio 11 - 26 - - -  Sodium 135 - 145 mmol/L 137 137 142  Potassium 3.5 - 5.1 mmol/L 3.5 3.6 3.8  Chloride 101 - 111 mmol/L 89(L) 94(L) -  CO2 22 - 32 mmol/L 34(H) 35(H) 33(H)  Calcium 8.9 - 10.3 mg/dL 8.9 9.0 9.8    BNP    Component Value Date/Time   BNP 304.4 (H) 06/24/2016 1413     ProBNP    Component Value Date/Time   PROBNP 1,120.0 (H) 01/17/2014 0315    PFT    Component Value Date/Time   FEV1PRE 1.03 04/15/2016 1202   FEV1POST 1.08 04/15/2016 1202   FVCPRE 1.79 04/15/2016 1202   FVCPOST 1.83 04/15/2016 1202   TLC 4.83 04/15/2016 1202   DLCOUNC 7.39 04/15/2016 1202   PREFEV1FVCRT 57 04/15/2016 1202   PSTFEV1FVCRT 59 04/15/2016 1202    No results found.   Past medical hx Past Medical History:  Diagnosis Date  . Allergic rhinitis   . Anemia 04/23/2012   "severe @ times; think caused by colonic AVMs"  . Anginal pain ([place])    "pressure"  . Arthritis    "probably"  . Asthma   . Breast cancer ([place])   . COPD (chronic obstructive pulmonary disease) ([place])   . Coronary atherosclerosis of native coronary artery    Cath 1/11: RCA 50-60 ostial o/w normal. EF 70%.   . Dyspnea 04/23/2012   "all the time"  . Edema   . Emphysema   . Emphysema   . Gastric AVM   . H/O hiatal hernia   . Heart murmur   . History of blood transfusion    "many times"  . Hypertension   . Hypertr obst cardiomyop    Echo 10/10  EF 65-70% SW 1.6 PW 1.4 mild SAM no LVOT gradient at rest. Valsalva not performed Mild MR. No hyperenhance ment on MRI EF 69%.  . Hypothyroidism   . Occlusion and stenosis of carotid artery    s/p L CEA u/s 3/11. R 60-79%; L 40-59%. no change since 3/10  . Oxygen dependent 04/23/2012   "concentrated at night; liquid during day"  . Peripheral vascular disease, unspecified ([place])   . Pneumonia   . Pulmonary hypertension ([place])    Cath 1/11 RA 8, RV 43/6/14, PA 42/17 (29) PCWP 15 Fick  5.0/2.6. PVR 2.8    . Stroke [facility]) ~ 2004   denies residual on 04/23/2012     Social History  Substance Use Topics  . Smoking status: Former Smoker    Packs/day: 1.00    Years: 30.00    Types: Cigarettes    Quit date: 09/16/1997  . Smokeless tobacco: Never Used  . Alcohol use No    Ms.Trent reports that she quit smoking about 19 years ago. Her smoking use included  Cigarettes. She has a 30.00 pack-year smoking history. She has never used smokeless tobacco. She reports that she does not drink alcohol or use drugs.  Tobacco Cessation: Former smoker  Past surgical hx, Family hx, Social hx all reviewed.  Current Outpatient Prescriptions on File Prior to Visit  Medication Sig  . albuterol (PROVENTIL) (2.5 MG/3ML) 0.083% nebulizer solution Take 3 mLs (2.5 mg total) by nebulization every 6 (six) hours as needed for wheezing or shortness of breath.  . ALPRAZolam (XANAX) 0.5 MG tablet Take 1 tablet (0.5 mg total) by mouth at bedtime as needed for anxiety or sleep.  Marland Kitchen atorvastatin (LIPITOR) 20 MG tablet Take 20 mg by mouth daily.    . cholecalciferol (VITAMIN D) 1000 units tablet Take 1,000 Units by mouth daily.  Marland Kitchen dextromethorphan (DELSYM) 30 MG/5ML liquid Take 30 mg by mouth at bedtime as needed for cough.   . escitalopram (LEXAPRO) 20 MG tablet Take 20 mg by mouth daily.   . furosemide (LASIX) 40 MG tablet Take 40 mg by mouth daily.  . furosemide (LASIX) 40 MG tablet TAKE (1) TABLET BY MOUTH ONCE DAILY AS NEEDED FOR FLUID OR EDEMA  . guaiFENesin (MUCINEX) 600 MG 12 hr tablet Take 2 tablets (1,200 mg total) by mouth 2 (two) times daily. (Patient taking differently: Take 1,200 mg by mouth 2 (two) times daily as needed for cough. )  . HYDROcodone-acetaminophen (NORCO/VICODIN) 5-325 MG tablet Take 1 tablet by mouth every 4 (four) hours as needed for moderate pain.  Marland Kitchen levothyroxine (SYNTHROID, LEVOTHROID) 150 MCG tablet Take 150 mcg by mouth daily before breakfast.   . metoprolol tartrate (LOPRESSOR) 25 MG tablet Take 25 mg by mouth 2 (two) times daily.  . Multiple Vitamin (MULTIVITAMIN WITH MINERALS) TABS tablet Take 1 tablet by mouth daily.  . pantoprazole (PROTONIX) 40 MG tablet Take 1 tablet (40 mg total) by mouth 2 (two) times daily.  . polyethylene glycol (MIRALAX / GLYCOLAX) packet Take 17 g by mouth daily as needed for mild constipation.  . SYMBICORT  160-4.5 MCG/ACT inhaler INHALE 2 PUFFS BY MOUTH TWICE DAILY  . Tiotropium Bromide Monohydrate (SPIRIVA RESPIMAT) 2.5 MCG/ACT AERS 2 pffs each am  . tolterodine (DETROL LA) 2 MG 24 hr capsule Take 2 mg by mouth 2 (two) times daily.   . traZODone (DESYREL) 100 MG tablet Take 100 mg by mouth at bedtime.   . VENTOLIN HFA 108 (90 Base) MCG/ACT inhaler INHALE  2 PUFFS BY MOUTH EVERY 6 HOURS FOR SHORTNESS OF BREATH OR WHEEZING.  . VOLTAREN 1 % GEL Apply 2 g topically 4 (four) times daily as needed. Apply to hand for pain   No current facility-administered medications on file prior to visit.      Allergies  Allergen Reactions  . Latex Hives and Itching    Review Of Systems:  Constitutional:   No  weight loss, night sweats,  Fevers, chills, +fatigue, or  lassitude.  HEENT:   No headaches,  Difficulty swallowing,  Tooth/dental problems, or  Sore throat,                No sneezing, itching, ear ache, nasal congestion, post nasal drip,   CV:  No chest pain,  Orthopnea, PND, swelling in lower extremities, anasarca, dizziness, palpitations, syncope.   GI  No heartburn, indigestion, abdominal pain, nausea, vomiting, diarrhea, change in bowel habits, loss of appetite, bloody stools.   Resp: + shortness of breath with exertion or at rest.  + excess mucus, + productive cough,  No non-productive cough,  No coughing up of blood.  + change in color of mucus.  + wheezing.  No chest wall deformity  Skin: no rash or lesions.  GU: no dysuria, change in color of urine, no urgency or frequency.  No flank pain, no hematuria   MS:  No joint pain or swelling.  No decreased range of motion.  No back pain.  Psych:  No change in mood or affect. No depression or anxiety.  No memory loss.   Vital Signs BP 110/62 (BP Location: Left Arm, Cuff Size: Normal)   Pulse 81   Temp 98 F (36.7 C)   SpO2 94%    Physical Exam:  General- No distress,  A&Ox3, pleasant ENT: No sinus tenderness, TM clear, pale nasal  mucosa, no oral exudate,no post nasal drip, no LAN Cardiac: S1, S2, regular rate and rhythm, no murmur Chest: + wheeze/ no rales/ dullness; no accessory muscle use, no nasal flaring, no sternal retractions Abd.: Soft Non-tender, non-distended, BS+ Ext: No clubbing cyanosis, edema Neuro:  normal strength, deconditioned at baseline Skin: No rashes, warm and dry Psych: normal mood and behavior   Assessment/Plan  COPD with acute exacerbation (HCC) COPD flare Plan Levaquin 500 mg once daily x 7 days. Prednisone taper; 10 mg tablets: 4 tabs x 2 days, 3 tabs x 2 days, 2 tabs x 2 days 1 tab x 2 days then stop. Please continue your Symbicort and Incruse as you have been doing. Continue albuterol neb treatments as needed up to 4 times a day. Rinse mouth after use. Continue the Zyrtec daily. Continue the Flonase daily. You can stop the Flutter valve and Mucinex and use as needed. Continue using the oxygen at 3 L as you have been doing.  Follow up with Joylynn Defrancesco   in 2 weeks Flu shot when better Let us know if you would like to try pulmonary rehab  Please contact office for sooner follow up if symptoms do not improve or worsen or seek emergency care    Acute on chronic respiratory failure with hypoxia ([place]) Continue wearing oxygen at 3 L nasal cannula Oxygen saturation goals are 88-92%    Magdalen Spatz, NP 05/12/2017  1:27 PM

## 2017-05-12 NOTE — Patient Instructions (Addendum)
It is good to see you today. We will prescribe Levaquin 500 mg once daily x 7 days. Prednisone taper; 10 mg tablets: 4 tabs x 2 days, 3 tabs x 2 days, 2 tabs x 2 days 1 tab x 2 days then stop. Please continue your Symbicort and Incruse as you have been doing. Continue albuterol neb treatments as needed up to 4 times a day. Rinse mouth after use. Continue the Zyrtec daily. Continue the Flonase daily. You can stop the Flutter valve and Mucinex and use as needed. Continue using the oxygen at 3 L as you have been doing.  Follow up with Ragan Reale   in 2 weeks Flu shot when better Let us know if you would like to try pulmonary rehab  Please contact office for sooner follow up if symptoms do not improve or worsen or seek emergency care

## 2017-05-12 NOTE — Assessment & Plan Note (Signed)
COPD flare Plan Levaquin 500 mg once daily x 7 days. Prednisone taper; 10 mg tablets: 4 tabs x 2 days, 3 tabs x 2 days, 2 tabs x 2 days 1 tab x 2 days then stop. Please continue your Symbicort and Incruse as you have been doing. Continue albuterol neb treatments as needed up to 4 times a day. Rinse mouth after use. Continue the Zyrtec daily. Continue the Flonase daily. You can stop the Flutter valve and Mucinex and use as needed. Continue using the oxygen at 3 L as you have been doing.  Follow up with Gustavo Meditz   in 2 weeks Flu shot when better Let us know if you would like to try pulmonary rehab  Please contact office for sooner follow up if symptoms do not improve or worsen or seek emergency care

## 2017-06-02 ENCOUNTER — Ambulatory Visit (INDEPENDENT_AMBULATORY_CARE_PROVIDER_SITE_OTHER)
Admission: RE | Admit: 2017-06-02 | Discharge: 2017-06-02 | Disposition: A | Discharge status: Home / Self Care | Payer: Medicare Other | Source: Ambulatory Visit | Attending: Acute Care | Admitting: Acute Care

## 2017-06-02 ENCOUNTER — Encounter: Payer: Self-pay | Admitting: Acute Care

## 2017-06-02 ENCOUNTER — Ambulatory Visit (INDEPENDENT_AMBULATORY_CARE_PROVIDER_SITE_OTHER): Payer: Medicare Other | Admitting: Acute Care

## 2017-06-02 ENCOUNTER — Other Ambulatory Visit (INDEPENDENT_AMBULATORY_CARE_PROVIDER_SITE_OTHER): Payer: Medicare Other

## 2017-06-02 VITALS — BP 112/60 | HR 93

## 2017-06-02 DIAGNOSIS — D509 Iron deficiency anemia, unspecified: Secondary | ICD-10-CM | POA: Diagnosis not present

## 2017-06-02 DIAGNOSIS — R0602 Shortness of breath: Secondary | ICD-10-CM

## 2017-06-02 DIAGNOSIS — I5033 Acute on chronic diastolic (congestive) heart failure: Secondary | ICD-10-CM

## 2017-06-02 DIAGNOSIS — J441 Chronic obstructive pulmonary disease with (acute) exacerbation: Secondary | ICD-10-CM | POA: Diagnosis not present

## 2017-06-02 DIAGNOSIS — J189 Pneumonia, unspecified organism: Secondary | ICD-10-CM | POA: Diagnosis not present

## 2017-06-02 LAB — CBC WITH DIFFERENTIAL/PLATELET
BASOS ABS: 0 10*3/uL (ref 0.0–0.1)
Basophils Relative: 0.4 % (ref 0.0–3.0)
EOS PCT: 4.4 % (ref 0.0–5.0)
Eosinophils Absolute: 0.4 10*3/uL (ref 0.0–0.7)
HCT: 32.8 % — ABNORMAL LOW (ref 36.0–46.0)
Hemoglobin: 10.7 g/dL — ABNORMAL LOW (ref 12.0–15.0)
LYMPHS PCT: 9.5 % — AB (ref 12.0–46.0)
Lymphs Abs: 0.8 10*3/uL (ref 0.7–4.0)
MCHC: 32.6 g/dL (ref 30.0–36.0)
MCV: 95.8 fl (ref 78.0–100.0)
MONOS PCT: 8.8 % (ref 3.0–12.0)
Monocytes Absolute: 0.7 10*3/uL (ref 0.1–1.0)
NEUTROS PCT: 76.9 % (ref 43.0–77.0)
Neutro Abs: 6.3 10*3/uL (ref 1.4–7.7)
Platelets: 159 10*3/uL (ref 150.0–400.0)
RBC: 3.43 Mil/uL — AB (ref 3.87–5.11)
RDW: 13.5 % (ref 11.5–15.5)
WBC: 8.2 10*3/uL (ref 4.0–10.5)

## 2017-06-02 LAB — FERRITIN: Ferritin: 58.4 ng/mL (ref 10.0–291.0)

## 2017-06-02 MED ORDER — PREDNISONE 10 MG PO TABS: ORAL_TABLET | ORAL | 0 refills | Status: DC

## 2017-06-02 MED ORDER — HYDROCODONE-HOMATROPINE 5-1.5 MG/5ML PO SYRP: 5.0000 mL | ORAL_SOLUTION | Freq: Four times a day (QID) | ORAL | 0 refills | Status: DC | PRN

## 2017-06-02 NOTE — Assessment & Plan Note (Signed)
CBC and ferritin today We will call with results Follow-up with Dr. Burr Medico as scheduled

## 2017-06-02 NOTE — Assessment & Plan Note (Signed)
Patient has not been seen by cardiology 1 year Referral to [place] ( She has seen [name] in the past) CXR today We will order a 2 D Echo today. Follow up with Dr. Lamonte Sakai 06/19/2017 as is scheduled Please contact office for sooner follow up if symptoms do not improve or worsen or seek emergency care

## 2017-06-02 NOTE — Patient Instructions (Addendum)
It is nice to see you today.  Referral to [name] ( She has seen [name] in the past) CXR today, please comment on vertebral compression fractures if noted. Hycodan cough syrup 5cc's at bedtime for cough. Don't drive if sleepy. Prednisone taper; 10 mg tablets: 4 tabs x 2 days, 3 tabs x 2 days, 2 tabs x 2 days 1 tab x 2 days then start 5 mg daily until seen by Dr, Lamonte Sakai on 06/19/2017 CBC , ferratin today. We will call with results. Follow up with Dr. Burr Medico. We will order a 2 D Echo today. Follow up with Dr. Lamonte Sakai 06/19/2017 as is scheduled Please contact office for sooner follow up if symptoms do not improve or worsen or seek emergency care

## 2017-06-02 NOTE — Progress Notes (Signed)
History of Present Illness [name] is a 83 y.o. female  Former smoker ( quit 1999) with with severe COPD, chronic hypoxemic respiratory failure, hypertension with diastolic dysfunction, hypertrophic cardiomyopathy and bladder cancer.She is followed by Dr. Lamonte Larson.    06/02/2017 Pt. Presents for a 3 week follow up. She was seen 05/12/2017 for COPD flare. Treatment plan at that time was: Levaquin 500 mg once daily x 7 days. Prednisone taper; 10 mg tablets: 4 tabs x 2 days, 3 tabs x 2 days, 2 tabs x 2 days 1 tab x 2 days then stop. Please continue your Symbicort and Incruse as you have been doing. Continue albuterol neb treatments as needed up to 4 times a day. Rinse mouth after use. Continue the Zyrtec daily. Continue the Flonase daily. You can stop the Flutter valve and Mucinex and use as needed. Continue using the oxygen at 3 L as you have been doing.  Follow up with [name] in 2 weeks Flu shot when better Let us know if you would like to try pulmonary rehab   She presents for follow up today. She states she completed her prednisone taper and antibiotic. She states she felt better initially, but as soon as she completed the Lavaquin and prednisone taper she started feeling bad again.She states she felt better x 1 week after completing treatment, and then she has felt bad for another week. She states she has had no fever, she has right sided pain due to coughing. She states she is coughing up clear secretions. No orthopnea or hemoptysis.She is using her albuterol nebulizer twice daily.She remains short of breath with a non-productive cough. She has lower extremity edema that is worse than her baseline. She has not taken any additional dosing of Lasix  for  Edema worse than baseline  per her cardiologists order.Per her niece, her edema is at the patient's baseline.  Test Results: 06/02/2017 CXR stable chronic lung disease. No superimposed acute findings are identified. 2. Aortic  Atherosclerosis (ICD10-I70.0) and Emphysema (ICD10-J43.9). 3. Osteopenia and chronic spinal compression fractures.  Echo 01/17/14 >> mod LVH, EF 65 to 70%, mod AS, mild MS, mild/mod LA dilation, PAS 42 mmHg PFT 04/15/16 >> FEV1 1.08 (54%), FEV1% 59, TLC 4.83 (92%), DLCO 28%   CBC Latest Ref Rng & Units 02/20/2017 01/21/2017 12/19/2016  WBC 4.0 - 10.5 K/uL 12.4(H) 8.7 6.5  Hemoglobin 12.0 - 15.0 g/dL 13.6 13.0 13.0  Hematocrit 36.0 - 46.0 % 43.6 42.1 39.8  Platelets 150 - 400 K/uL 284 196 233    BMP Latest Ref Rng & Units 02/20/2017 01/21/2017 12/19/2016  Glucose 65 - 99 mg/dL 126(H) 113(H) 156(H)  BUN 6 - 20 mg/dL 12 11 15.8  Creatinine 0.44 - 1.00 mg/dL 0.75 0.69 0.8  BUN/Creat Ratio 11 - 26 - - -  Sodium 135 - 145 mmol/L 137 137 142  Potassium 3.5 - 5.1 mmol/L 3.5 3.6 3.8  Chloride 101 - 111 mmol/L 89(L) 94(L) -  CO2 22 - 32 mmol/L 34(H) 35(H) 33(H)  Calcium 8.9 - 10.3 mg/dL 8.9 9.0 9.8    BNP    Component Value Date/Time   BNP 304.4 (H) 06/24/2016 1413    ProBNP    Component Value Date/Time   PROBNP 1,120.0 (H) 01/17/2014 0315    PFT    Component Value Date/Time   FEV1PRE 1.03 04/15/2016 1202   FEV1POST 1.08 04/15/2016 1202   FVCPRE 1.79 04/15/2016 1202   FVCPOST 1.83 04/15/2016 1202   TLC 4.83 04/15/2016  1202   DLCOUNC 7.39 04/15/2016 1202   PREFEV1FVCRT 57 04/15/2016 1202   PSTFEV1FVCRT 59 04/15/2016 1202    Dg Chest 2 View  Result Date: 06/02/2017 CLINICAL DATA:  83 year old female with shortness of breath for 2 weeks. Chronic lung disease. Former smoker. EXAM: CHEST  2 VIEW COMPARISON:  03/10/2017 and earlier. FINDINGS: Stable mild-to-moderate cardiomegaly and mediastinal contours. Calcified aortic atherosclerosis. Stable lung volumes. Chronic right upper lobe emphysema with attenuation of bronchovascular markings there. Widespread chronic increased interstitial markings elsewhere have not significantly changed. No pneumothorax, pleural effusion or acute pulmonary  opacity. Osteopenia with multilevel thoracic and lumbar compression fractures. Chronic augmentation of the level of the thoracolumbar junction. Negative visible bowel gas pattern. IMPRESSION: 1. Stable chronic lung disease. No superimposed acute findings are identified. 2. Aortic Atherosclerosis (ICD10-I70.0) and Emphysema (ICD10-J43.9). 3. Osteopenia and chronic spinal compression fractures. Electronically Signed   By: Genevie Ann M.D.   On: 06/02/2017 12:31     Past medical hx Past Medical History:  Diagnosis Date  . Allergic rhinitis   . Anemia 04/23/2012   "severe @ times; think caused by colonic AVMs"  . Anginal pain ([place])    "pressure"  . Arthritis    "probably"  . Asthma   . Breast cancer ([place])   . COPD (chronic obstructive pulmonary disease) ([place])   . Coronary atherosclerosis of native coronary artery    Cath 1/11: RCA 50-60 ostial o/w normal. EF 70%.   . Dyspnea 04/23/2012   "all the time"  . Edema   . Emphysema   . Emphysema   . Gastric AVM   . H/O hiatal hernia   . Heart murmur   . History of blood transfusion    "many times"  . Hypertension   . Hypertr obst cardiomyop    Echo 10/10 EF 65-70% SW 1.6 PW 1.4 mild SAM no LVOT gradient at rest. Valsalva not performed Mild MR. No hyperenhance ment on MRI EF 69%.  . Hypothyroidism   . Occlusion and stenosis of carotid artery    s/p L CEA u/s 3/11. R 60-79%; L 40-59%. no change since 3/10  . Oxygen dependent 04/23/2012   "concentrated at night; liquid during day"  . Peripheral vascular disease, unspecified ([place])   . Pneumonia   . Pulmonary hypertension ([place])    Cath 1/11 RA 8, RV 43/6/14, PA 42/17 (29) PCWP 15 Fick  5.0/2.6. PVR 2.8    . Stroke [facility]) ~ 2004   denies residual on 04/23/2012     Social History  Substance Use Topics  . Smoking status: Former Smoker    Packs/day: 1.00    Years: 30.00    Types: Cigarettes    Quit date: 09/16/1997  . Smokeless tobacco: Never Used  . Alcohol use No    [name] reports that she  quit smoking about 19 years ago. Her smoking use included Cigarettes. She has a 30.00 pack-year smoking history. She has never used smokeless tobacco. She reports that she does not drink alcohol or use drugs.  Tobacco Cessation: Former smoker, quit 1999  Past surgical hx, Family hx, Social hx all reviewed.  Current Outpatient Prescriptions on File Prior to Visit  Medication Sig  . albuterol (PROVENTIL) (2.5 MG/3ML) 0.083% nebulizer solution Take 3 mLs (2.5 mg total) by nebulization every 6 (six) hours as needed for wheezing or shortness of breath.  . ALPRAZolam (XANAX) 0.5 MG tablet Take 1 tablet (0.5 mg total) by mouth at bedtime as needed for anxiety or sleep.  Marland Kitchen  atorvastatin (LIPITOR) 20 MG tablet Take 20 mg by mouth daily.    . cholecalciferol (VITAMIN D) 1000 units tablet Take 1,000 Units by mouth daily.  Marland Kitchen dextromethorphan (DELSYM) 30 MG/5ML liquid Take 30 mg by mouth at bedtime as needed for cough.   . escitalopram (LEXAPRO) 20 MG tablet Take 20 mg by mouth daily.   . furosemide (LASIX) 40 MG tablet Take 40 mg by mouth daily.  . furosemide (LASIX) 40 MG tablet TAKE (1) TABLET BY MOUTH ONCE DAILY AS NEEDED FOR FLUID OR EDEMA  . guaiFENesin (MUCINEX) 600 MG 12 hr tablet Take 2 tablets (1,200 mg total) by mouth 2 (two) times daily. (Patient taking differently: Take 1,200 mg by mouth 2 (two) times daily as needed for cough. )  . HYDROcodone-acetaminophen (NORCO/VICODIN) 5-325 MG tablet Take 1 tablet by mouth every 4 (four) hours as needed for moderate pain.  Marland Kitchen levothyroxine (SYNTHROID, LEVOTHROID) 150 MCG tablet Take 150 mcg by mouth daily before breakfast.   . metoprolol tartrate (LOPRESSOR) 25 MG tablet Take 25 mg by mouth 2 (two) times daily.  . Multiple Vitamin (MULTIVITAMIN WITH MINERALS) TABS tablet Take 1 tablet by mouth daily.  . pantoprazole (PROTONIX) 40 MG tablet Take 1 tablet (40 mg total) by mouth 2 (two) times daily.  . polyethylene glycol (MIRALAX / GLYCOLAX) packet Take 17 g  by mouth daily as needed for mild constipation.  . SYMBICORT 160-4.5 MCG/ACT inhaler INHALE 2 PUFFS BY MOUTH TWICE DAILY  . Tiotropium Bromide Monohydrate (SPIRIVA RESPIMAT) 2.5 MCG/ACT AERS 2 pffs each am  . tolterodine (DETROL LA) 2 MG 24 hr capsule Take 2 mg by mouth 2 (two) times daily.   . traZODone (DESYREL) 100 MG tablet Take 100 mg by mouth at bedtime.   . VENTOLIN HFA 108 (90 Base) MCG/ACT inhaler INHALE 2 PUFFS BY MOUTH EVERY 6 HOURS FOR SHORTNESS OF BREATH OR WHEEZING.  . VOLTAREN 1 % GEL Apply 2 g topically 4 (four) times daily as needed. Apply to hand for pain   No current facility-administered medications on file prior to visit.      Allergies  Allergen Reactions  . Latex Hives and Itching    Review Of Systems:  Constitutional:   No  weight loss, night sweats,  Fevers, chills, +fatigue, or + lassitude.  HEENT:   No headaches,  Difficulty swallowing,  Tooth/dental problems, or  Sore throat,                No sneezing, itching, ear ache, +nasal congestion, +post nasal drip,   CV:  No chest pain,  Orthopnea, PND, + swelling in lower extremities, anasarca, dizziness, palpitations, syncope.   GI  No heartburn, indigestion, abdominal pain, nausea, vomiting, diarrhea, change in bowel habits, loss of appetite, bloody stools.   Resp: + shortness of breath with exertion or at rest.  No excess mucus, + productive cough,  + non-productive cough,  No coughing up of blood.  No change in color of mucus.  + wheezing.  No chest wall deformity  Skin: no rash or lesions.  GU: no dysuria, change in color of urine, no urgency or frequency.  No flank pain, no hematuria   MS:  No joint pain or swelling.  No decreased range of motion.  + back pain.( 2/2 coughing)  Psych:  No change in mood or affect. No depression or anxiety.  No memory loss.   Vital Signs BP 112/60 (BP Location: Left Arm, Cuff Size: Normal)   Pulse 93  SpO2 (!) 85%    Physical Exam:  General- No distress,   A&Ox3, multiple complaints ENT: No sinus tenderness, TM clear, pale nasal mucosa, no oral exudate,no post nasal drip, no LAN Cardiac: S1, S2, regular rate and rhythm, + murmur Chest: No wheeze/ rales/ dullness; no accessory muscle use, no nasal flaring, no sternal retractions Abd.: Soft Non-tender, non-distended, non-tender Ext: No clubbing cyanosis, 3 + LE bilateral edema Neuro:  Deconditioned at baseline, cranial nerves intact Skin: No rashes, warm and dry, Psych: normal mood and behavior   Assessment/Plan I suspect Vanellope Ann's symptoms are multifactorial.  CXR today is negative for acute process of CHF.   COPD with acute exacerbation ([place]) Continued dyspnea after completion of Levaquin and prednisone States improved 1 week then once medications were completed symptoms returned Chest x-ray today is clear for any acute process Plan CXR today, please comment on vertebral compression fractures if noted. Hycodan cough syrup 5cc's at bedtime for cough. Don't drive if sleepy. Prednisone taper; 10 mg tablets: 4 tabs x 2 days, 3 tabs x 2 days, 2 tabs x 2 days 1 tab x 2 days then start 5 mg daily until seen by Dr, [name] on 06/19/2017 Flutter valve as needed for chest congestion Mucinex as needed for chest congestion Continue Symbicort and increase as you have been doing Rinse mouth after use Continue albuterol nebulizer treatments as needed up to 4 times daily Continue Zyrtec daily Continue Flonase daily Flu shot at follow-up when better Consider pulmonary rehabilitation when able Follow up with Dr. Lamonte Larson 06/19/2017 as is scheduled Please contact office for sooner follow up if symptoms do not improve or worsen or seek emergency care    Diastolic CHF, acute on chronic Patient has not been seen by cardiology 1 year Referral to [facility] ( She has seen [name] in the past) CXR today We will order a 2 D Echo today. Follow up with Dr. Lamonte Larson 06/19/2017 as is scheduled Please  contact office for sooner follow up if symptoms do not improve or worsen or seek emergency care    Microcytic anemia CBC and ferritin today We will call with results Follow-up with Dr. Burr Medico as scheduled    Magdalen Spatz, NP 06/02/2017  1:25 PM

## 2017-06-02 NOTE — Assessment & Plan Note (Signed)
Continued dyspnea after completion of Levaquin and prednisone States improved 1 week then once medications were completed symptoms returned Chest x-ray today is clear for any acute process Plan CXR today, please comment on vertebral compression fractures if noted. Hycodan cough syrup 5cc's at bedtime for cough. Don't drive if sleepy. Prednisone taper; 10 mg tablets: 4 tabs x 2 days, 3 tabs x 2 days, 2 tabs x 2 days 1 tab x 2 days then start 5 mg daily until seen by Dr, Lamonte Sakai on 06/19/2017 Flutter valve as needed for chest congestion Mucinex as needed for chest congestion Continue Symbicort and increase as you have been doing Rinse mouth after use Continue albuterol nebulizer treatments as needed up to 4 times daily Continue Zyrtec daily Continue Flonase daily Flu shot at follow-up when better Consider pulmonary rehabilitation when able Follow up with Dr. Lamonte Sakai 06/19/2017 as is scheduled Please contact office for sooner follow up if symptoms do not improve or worsen or seek emergency care

## 2017-06-03 ENCOUNTER — Telehealth: Payer: Self-pay | Admitting: *Deleted

## 2017-06-03 NOTE — Telephone Encounter (Signed)
Received vm call from pt's niece stating that pt was seen yest by [facility] due to difficulty breathing & SOB.  They noted that her ferritin was 58.4.  [place] r/o infection but she is wondering if the breathing issues could be secondary to lower ferritin & does she need an iron infusion?  Message to Dr Burr Medico.

## 2017-06-03 NOTE — Telephone Encounter (Signed)
I have reviewed her lab, yes, she needs iv iron this week, and second dose with her next appointment with me on 10/4, I will send a scheduling message. Please let patient know.   Truitt Merle MD

## 2017-06-04 ENCOUNTER — Other Ambulatory Visit: Payer: Self-pay | Admitting: Hematology

## 2017-06-04 NOTE — Telephone Encounter (Signed)
Talked with niece this am & scheduler will call her soon.  She would like to have appt this fri am since she has a afternoon appt for ECHO in [place] due to traveling time & mobility issues.

## 2017-06-06 ENCOUNTER — Ambulatory Visit (HOSPITAL_COMMUNITY): Payer: Medicare Other | Attending: Cardiology

## 2017-06-06 ENCOUNTER — Ambulatory Visit (HOSPITAL_BASED_OUTPATIENT_CLINIC_OR_DEPARTMENT_OTHER): Payer: Medicare Other

## 2017-06-06 ENCOUNTER — Other Ambulatory Visit: Payer: Self-pay

## 2017-06-06 VITALS — BP 112/64 | HR 99 | Temp 99.1°F | Resp 18

## 2017-06-06 DIAGNOSIS — I272 Pulmonary hypertension, unspecified: Secondary | ICD-10-CM | POA: Insufficient documentation

## 2017-06-06 DIAGNOSIS — K5521 Angiodysplasia of colon with hemorrhage: Secondary | ICD-10-CM

## 2017-06-06 DIAGNOSIS — R0602 Shortness of breath: Secondary | ICD-10-CM | POA: Insufficient documentation

## 2017-06-06 DIAGNOSIS — D509 Iron deficiency anemia, unspecified: Secondary | ICD-10-CM

## 2017-06-06 DIAGNOSIS — I081 Rheumatic disorders of both mitral and tricuspid valves: Secondary | ICD-10-CM | POA: Insufficient documentation

## 2017-06-06 LAB — ECHOCARDIOGRAM COMPLETE

## 2017-06-06 MED ORDER — SODIUM CHLORIDE 0.9 % IV SOLN
510.0000 mg | INTRAVENOUS | Status: DC
  Administered 2017-06-06: 510 mg via INTRAVENOUS
  Filled 2017-06-06: qty 17

## 2017-06-06 MED ORDER — SODIUM CHLORIDE 0.9 % IV SOLN
Freq: Once | INTRAVENOUS | Status: AC
  Administered 2017-06-06: 13:00:00 via INTRAVENOUS

## 2017-06-06 NOTE — Patient Instructions (Signed)

## 2017-06-06 NOTE — Progress Notes (Signed)
Patient tolerated therapy well. Patient remained stable during observation period. Patient and VS stable on exit.

## 2017-06-17 NOTE — Progress Notes (Signed)
[place] OFFICE PROGRESS NOTE DATE OF SERVICE: 06/19/2017   [name], MD [name] [address]  DIAGNOSIS: Iron deficiency anemia due to chronic blood loss  Chief complaint: Follow-up anemia  CURRENT THERAPY: Feraheme 1020 mg IV prn, average every 2-3 months.  Currently we are checking CBC and ferritin every month (at her PCP's office). Patient received last Feraheme in 06/19/17.  INTERVAL HISTORY: [name] 83 y.o. female with a a history of anemia secondary to iron deficiency and gastrointestinal bleeding from colonic arteriovenous malformations is here for follow-up.  She was last seen by me 6 months ago. She presents in the clinic today with her granddaughter. She needs to go to her Pulmonologist at 1:00pm today so she would like to make her visit with [place] fast. She is getting Prednisone 5 mg for her COPD. Her granddaughter notes her last year has had several flares of COPD but not able to get fully back to baseline. The last few days she has developed a slight cough.  She notes this has not been a good week for her. She feels tired/fatigured and does not feel like getting up. She has bruising along her left arm from the multiple sticks for her labs. She does not want a port. She takes a baby Asprin daily that may contribute. She has BM and does not report any blood in stool/hematochezia. Shet notes swelling in her lower legs. She had an 06/06/17 ECHO and her heart function is normal.    MEDICAL HISTORY: Past Medical History:  Diagnosis Date  . Allergic rhinitis   . Anemia 04/23/2012   "severe @ times; think caused by colonic AVMs"  . Anginal pain ([place])    "pressure"  . Arthritis    "probably"  . Asthma   . Breast cancer ([place])   . COPD (chronic obstructive pulmonary disease) ([place])   . Coronary atherosclerosis of native coronary artery    Cath 1/11: RCA 50-60 ostial o/w normal. EF 70%.   . Dyspnea 04/23/2012   "all the time"  .  Edema   . Emphysema   . Emphysema   . Gastric AVM   . H/O hiatal hernia   . Heart murmur   . History of blood transfusion    "many times"  . Hypertension   . Hypertr obst cardiomyop    Echo 10/10 EF 65-70% SW 1.6 PW 1.4 mild SAM no LVOT gradient at rest. Valsalva not performed Mild MR. No hyperenhance ment on MRI EF 69%.  . Hypothyroidism   . Occlusion and stenosis of carotid artery    s/p L CEA u/s 3/11. R 60-79%; L 40-59%. no change since 3/10  . Oxygen dependent 04/23/2012   "concentrated at night; liquid during day"  . Peripheral vascular disease, unspecified ([place])   . Pneumonia   . Pulmonary hypertension ([place])    Cath 1/11 RA 8, RV 43/6/14, PA 42/17 (29) PCWP 15 Fick  5.0/2.6. PVR 2.8    . Stroke [facility]) ~ 2004   denies residual on 04/23/2012    INTERIM HISTORY: has HYPOTHYROIDISM; HYPERTENSION; CAD, NATIVE VESSEL; PULMONARY HYPERTENSION, SECONDARY; Hypertrophic obstructive cardiomyopathy(425.11); CAROTID ARTERY DISEASE; C V A / STROKE; PVD; Allergic rhinitis; EMPHYSEMA; ASTHMA; COPD (chronic obstructive pulmonary disease) ([place]); Edema; DYSPNEA; ABNORMAL ECHOCARDIOGRAM; ABNORMAL EKG; BREAST CANCER, HX OF; HYPOTHYROIDISM; GERD (gastroesophageal reflux disease); Exertional angina ([place]); Chest pain on exertion; CAD (coronary artery disease); Microcytic anemia; Angiodysplasia of intestine with hemorrhage; Nonspecific abnormal finding in stool  contents; GI bleed; Acute blood loss anemia; Fever; Sepsis ([place]); Acute renal failure ([place]); Normocytic anemia; Elevated brain natriuretic peptide (BNP) level; Diastolic CHF, acute on chronic ([place]); Iron deficiency anemia due to chronic blood loss; Symptomatic anemia; COPD with acute exacerbation ([place]); Multiple pulmonary nodules; Acute on chronic respiratory failure with hypoxia ([place]); Bruising; Cough; Thrush; Chronic respiratory failure with hypoxia ([place]); and Chronic respiratory failure with hypoxia and hypercapnia (HCC) on her problem list.     ALLERGIES:  is allergic to latex.  MEDICATIONS: has a current medication list which includes the following prescription(s): albuterol, atorvastatin, budesonide-formoterol, calcium carbonate, cetirizine, cholecalciferol, dextromethorphan, escitalopram, fluticasone, furosemide, levothyroxine, multivitamin with minerals, polyethylene glycol, senna-docusate, tolterodine, trazodone, umeclidinium bromide, and voltaren.  SURGICAL HISTORY:  Past Surgical History:  Procedure Laterality Date  . APPENDECTOMY  1950  . BREAST LUMPECTOMY  2002   right-hx of radiation  . CARDIAC CATHETERIZATION    . CAROTID ENDARTERECTOMY  2005   left  . CATARACT EXTRACTION W/ INTRAOCULAR LENS IMPLANT  02/2011   right  . COLONOSCOPY N/A 01/12/2014   Procedure: COLONOSCOPY;  Surgeon: Gatha Mayer, MD;  Location: [facility];  Service: Endoscopy;  Laterality: N/A;  MAC if available  . DILATION AND CURETTAGE OF UTERUS  1950's  . EXCISIONAL HEMORRHOIDECTOMY  1950's  . IR VERTEBROPLASTY CERV/THOR BX INC UNI/BIL INC/INJECT/IMAGING  01/21/2017  . THYROIDECTOMY  2007   PROBLEM LIST:  1. Anemia secondary to iron deficiency and gastrointestinal bleeding from colonic arteriovenous malformations. The patient received IV INFeD 1025 mg on 04/24/2012 when she was hospitalized. She has also received Feraheme 1020 mg IV on 07/17/2012, 08/21/2012 and 12/07/2012.  2. Chronic obstructive pulmonary disease (COPD) diagnosed in 1999, currently on home oxygen at 3 L/minute at rest and during sleep. The patient uses 4 L per minute with exertion. She has been on home oxygen for approximately the past 5 years.  3. Pulmonary hypertension.  4. Right frontal arteriovenous malformation (AVM).  5. Coronary artery disease.  6. Diastolic dysfunction.  7. Past history of multiple strokes.  8. History of left carotid stenosis with left carotid endarterectomy  in 2006 at [facility].  9. Hypertension.  10. Dyslipidemia.  11. Ductal carcinoma  in situ involving the right breast, status post  lumpectomy, radiation, and 5 years of tamoxifen.  12. Peripheral vascular disease.  13. Hiatal hernia.  14. Status post thyroidectomy in 2009 or 2010.  15. Osteoporosis.  16. Stroke involving right eye in April 2011, now with vision restored.  17. Diverticulosis.  18. Hearing impairment.  19. Systolic ejection murmur.  20. Hoarseness noted around December 2013. This was evaluated by an ENT specialist.  21. Bladder cancer , s/p surgical resection and radiation at [facility] on 05/2016, not a candidate for chemo  REVIEW OF SYSTEMS:   Constitutional: Denies fevers, chills or abnormal weight loss (+) fatigue Eyes: Denies blurriness of vision Ears, nose, mouth, throat, and face: Denies mucositis or sore throat Respiratory: Denies dyspnea or wheezes (+) cough  Cardiovascular: Denies palpitation, chest discomfort (+) lower extremity swelling Gastrointestinal:  Denies nausea, heartburn or change in bowel habits Skin: Denies abnormal skin rashes Lymphatics: Denies new lymphadenopathy (+) Bruising along left arm from attempted injection sticks Neurological:Denies numbness, tingling or new weaknesses Behavioral/Psych: Mood is stable, no new changes  All other systems were reviewed with the patient and are negative.  PHYSICAL EXAMINATION:  ECOG PERFORMANCE STATUS: 3  Blood pressure (!) 110/46, pulse 85, temperature 99 F (37.2 C), temperature source Oral,  resp. rate 20, height _0  (1.651 m), weight 142 lb (64.4 kg), SpO2 94 %.  GENERAL:alert, no distress and comfortable; elderly female who appears her stated age.  SKIN: skin color, texture, turgor are normal, no rashes or significant lesions EYES: normal, Conjunctiva are pink and non-injected, sclera clear OROPHARYNX:no exudate, no erythema and lips, buccal mucosa, and tongue normal  NECK: supple, thyroid normal size, non-tender, without nodularity LYMPH:  no palpable lymphadenopathy in the  cervical, axillary or supraclavicular LUNGS: clear to auscultation and percussion with normal breathing effort HEART: regular rate & rhythm and no murmurs and no lower extremity edema ABDOMEN:abdomen soft, non-tender and normal bowel sounds Musculoskeletal:no cyanosis of digits and no clubbing  NEURO: alert & oriented x 3 with fluent speech, no focal motor/sensory deficits  LABORATORY DATA: CBC Latest Ref Rng & Units 06/19/2017 06/02/2017 02/20/2017  WBC 3.9 - 10.3 10e3/uL 11.1(H) 8.2 12.4(H)  Hemoglobin 11.6 - 15.9 g/dL 11.5(L) 10.7(L) 13.6  Hematocrit 34.8 - 46.6 % 36.1 32.8(L) 43.6  Platelets 145 - 400 10e3/uL 210 159.0 284    CMP Latest Ref Rng & Units 06/19/2017 02/20/2017 01/21/2017  Glucose 70 - 140 mg/dl 174(H) 126(H) 113(H)  BUN 7.0 - 26.0 mg/dL 14._1 Creatinine 0.6 - 1.1 mg/dL 0.8 0.75 0.69  Sodium 136 - 145 mEq/L 142 137 137  Potassium 3.5 - 5.1 mEq/L 4.0 3.5 3.6  Chloride 101 - 111 mmol/L - 89(L) 94(L)  CO2 22 - 29 mEq/L 36(H) 34(H) 35(H)  Calcium 8.4 - 10.4 mg/dL 9.3 8.9 9.0  Total Protein 6.4 - 8.3 g/dL 6.8 - -  Total Bilirubin 0.20 - 1.20 mg/dL 0.29 - -  Alkaline Phos 40 - 150 U/L 100 - -  AST 5 - 34 U/L 15 - -  ALT 0 - 55 U/L 9 - -   Results for [name], [name] (MRN [id number]) as of 06/19/2017 14:12  Ref. Range 12/19/2016 10:27 06/02/2017 13:17 06/19/2017 10:18  Iron Latest Ref Range: 41 - 142 ug/dL 72  181 (H)  UIBC Latest Ref Range: 120 - 384 ug/dL 260  141  TIBC Latest Ref Range: 236 - 444 ug/dL 331  323  %SAT Latest Ref Range: 21 - 57 % 22  56  Ferritin Latest Ref Range: 9 - 269 ng/ml 117 58.4 242    RADIOGRAPHIC STUDIES: No results found.  ASSESSMENT: [name] 83 y.o. female with a history of Iron deficiency anemia due to chronic blood loss   PLAN:  1. Anemia secondary to iron deficiency and GI bleeding.  -She had multiple GI workup before, which showed AVM. She does not follow up with GI anymore -She responded to IV iron very well. - She has  been receiving IV Feraheme every 2-3 months. Iinfusion was in 10/2016 when she was hospitalized for COPD exacerbation -I'll continue her lab with CBC and Feraheme monthly at her PCP's office, the result will be faxed to me and I ask pt to call me after her lab test -IV Feraheme if ferritin less than 100 -Labs reviewed and her Hg is 11.5, improved, after her last iv iron infusion. She will get IV Ferriheme today. -She been receiving IV ferriheme every 2-3 months. And will continue -F/u in 6 months. I have advised her to call if she notices her results are low  2. COPD  -on oxygen, she was hospitalized early 2018 for COPD exacerbation  -f/u with pulmonary, she has appointment today   3. Fatigue  -likely related  to iron deficiency, which will likely improve quickly after IV Feraheme -She'll also follow-up with her primary care physician  4. HTN -Her blood pressure has previously been borderline low, her hypertension medication has been held by her primary care physician -She previously followed up with  Dr. Mancel Bale   5. Back pain from compression fracture  - Patient has been experiencing back pain potentially due to her compression fractures - I have recommended the patient try Tylenol to help -she will follow up with her orthopedics   6. Bladder cancer -She is status post surgical resection and radiation at [facility], not a candidate for chemotherapy -She will continue follow-up at the [facility], recent cystoscopy was negative for recurrence   PLAN  -Labs reviewed. Will proceed with iv feraheme today -Lab, f/u and iv ferame in 6 months  -monthly lab at Dr. Latrelle Dodrill office, I will fax the request to Dr. Herbie Baltimore. Pt's daughter will review the results and will call us if ferritin is low  All questions were answered. The patient knows to call the clinic with any problems, questions or concerns. We can certainly see the patient much sooner if necessary.  I spent 20 minutes counseling the  patient face to face. The total time spent in the appointment was 25 minutes.  This document serves as a record of services personally performed by Truitt Merle, MD. It was created on her behalf by Joslyn Devon, a trained medical scribe. The creation of this record is based on the scribe's personal observations and the provider's statements to them. This document has been checked and approved by the attending provider.    Truitt Merle  06/19/2017

## 2017-06-19 ENCOUNTER — Ambulatory Visit (HOSPITAL_BASED_OUTPATIENT_CLINIC_OR_DEPARTMENT_OTHER): Payer: Medicare Other | Admitting: Hematology

## 2017-06-19 ENCOUNTER — Encounter: Payer: Self-pay | Admitting: Hematology

## 2017-06-19 ENCOUNTER — Ambulatory Visit (INDEPENDENT_AMBULATORY_CARE_PROVIDER_SITE_OTHER): Payer: Medicare Other | Admitting: Emergency Medicine

## 2017-06-19 ENCOUNTER — Ambulatory Visit: Payer: Medicare Other

## 2017-06-19 ENCOUNTER — Telehealth: Payer: Self-pay | Admitting: Hematology

## 2017-06-19 ENCOUNTER — Encounter: Payer: Self-pay | Admitting: Emergency Medicine

## 2017-06-19 ENCOUNTER — Telehealth: Payer: Self-pay | Admitting: *Deleted

## 2017-06-19 VITALS — BP 110/46 | HR 85 | Temp 99.0°F | Resp 20 | Ht 65.0 in | Wt 142.0 lb

## 2017-06-19 VITALS — BP 115/53 | HR 84

## 2017-06-19 DIAGNOSIS — M545 Low back pain: Secondary | ICD-10-CM

## 2017-06-19 DIAGNOSIS — M7989 Other specified soft tissue disorders: Secondary | ICD-10-CM | POA: Diagnosis not present

## 2017-06-19 DIAGNOSIS — K922 Gastrointestinal hemorrhage, unspecified: Secondary | ICD-10-CM | POA: Diagnosis not present

## 2017-06-19 DIAGNOSIS — D509 Iron deficiency anemia, unspecified: Secondary | ICD-10-CM

## 2017-06-19 DIAGNOSIS — R05 Cough: Secondary | ICD-10-CM

## 2017-06-19 DIAGNOSIS — J301 Allergic rhinitis due to pollen: Secondary | ICD-10-CM | POA: Diagnosis not present

## 2017-06-19 DIAGNOSIS — D500 Iron deficiency anemia secondary to blood loss (chronic): Secondary | ICD-10-CM

## 2017-06-19 DIAGNOSIS — R5383 Other fatigue: Secondary | ICD-10-CM | POA: Diagnosis not present

## 2017-06-19 DIAGNOSIS — J441 Chronic obstructive pulmonary disease with (acute) exacerbation: Secondary | ICD-10-CM | POA: Diagnosis not present

## 2017-06-19 DIAGNOSIS — C679 Malignant neoplasm of bladder, unspecified: Secondary | ICD-10-CM | POA: Diagnosis not present

## 2017-06-19 DIAGNOSIS — J449 Chronic obstructive pulmonary disease, unspecified: Secondary | ICD-10-CM

## 2017-06-19 DIAGNOSIS — I421 Obstructive hypertrophic cardiomyopathy: Secondary | ICD-10-CM | POA: Diagnosis not present

## 2017-06-19 DIAGNOSIS — I10 Essential (primary) hypertension: Secondary | ICD-10-CM

## 2017-06-19 DIAGNOSIS — Z23 Encounter for immunization: Secondary | ICD-10-CM | POA: Diagnosis not present

## 2017-06-19 DIAGNOSIS — K5521 Angiodysplasia of colon with hemorrhage: Secondary | ICD-10-CM

## 2017-06-19 LAB — IRON AND TIBC
%SAT: 56 % (ref 21–57)
Iron: 181 ug/dL — ABNORMAL HIGH (ref 41–142)
TIBC: 323 ug/dL (ref 236–444)
UIBC: 141 ug/dL (ref 120–384)

## 2017-06-19 LAB — CBC WITH DIFFERENTIAL/PLATELET
BASO%: 0.6 % (ref 0.0–2.0)
Basophils Absolute: 0.1 10*3/uL (ref 0.0–0.1)
EOS%: 1.2 % (ref 0.0–7.0)
Eosinophils Absolute: 0.1 10*3/uL (ref 0.0–0.5)
HCT: 36.1 % (ref 34.8–46.6)
HGB: 11.5 g/dL — ABNORMAL LOW (ref 11.6–15.9)
LYMPH%: 5.8 % — ABNORMAL LOW (ref 14.0–49.7)
MCH: 30.8 pg (ref 25.1–34.0)
MCHC: 31.8 g/dL (ref 31.5–36.0)
MCV: 97.1 fL (ref 79.5–101.0)
MONO#: 0.6 10*3/uL (ref 0.1–0.9)
MONO%: 5.2 % (ref 0.0–14.0)
NEUT%: 87.2 % — ABNORMAL HIGH (ref 38.4–76.8)
NEUTROS ABS: 9.7 10*3/uL — AB (ref 1.5–6.5)
Platelets: 210 10*3/uL (ref 145–400)
RBC: 3.72 10*6/uL (ref 3.70–5.45)
RDW: 15.3 % — AB (ref 11.2–14.5)
WBC: 11.1 10*3/uL — AB (ref 3.9–10.3)
lymph#: 0.6 10*3/uL — ABNORMAL LOW (ref 0.9–3.3)

## 2017-06-19 LAB — COMPREHENSIVE METABOLIC PANEL
ALBUMIN: 3.3 g/dL — AB (ref 3.5–5.0)
ALK PHOS: 100 U/L (ref 40–150)
ALT: 9 U/L (ref 0–55)
AST: 15 U/L (ref 5–34)
Anion Gap: 9 mEq/L (ref 3–11)
BUN: 14.6 mg/dL (ref 7.0–26.0)
CO2: 36 meq/L — AB (ref 22–29)
Calcium: 9.3 mg/dL (ref 8.4–10.4)
Chloride: 96 mEq/L — ABNORMAL LOW (ref 98–109)
Creatinine: 0.8 mg/dL (ref 0.6–1.1)
EGFR: 74 mL/min/{1.73_m2} — ABNORMAL LOW (ref >90)
GLUCOSE: 174 mg/dL — AB (ref 70–140)
POTASSIUM: 4 meq/L (ref 3.5–5.1)
SODIUM: 142 meq/L (ref 136–145)
Total Bilirubin: 0.29 mg/dL (ref 0.20–1.20)
Total Protein: 6.8 g/dL (ref 6.4–8.3)

## 2017-06-19 LAB — FERRITIN: Ferritin: 242 ng/ml (ref 9–269)

## 2017-06-19 MED ORDER — FLUTICASONE PROPIONATE 50 MCG/ACT NA SUSP: 2.0000 | Freq: Every day | NASAL | 5 refills | Status: AC

## 2017-06-19 MED ORDER — SODIUM CHLORIDE 0.9 % IV SOLN
510.0000 mg | INTRAVENOUS | Status: DC
  Administered 2017-06-19: 510 mg via INTRAVENOUS
  Filled 2017-06-19: qty 17

## 2017-06-19 MED ORDER — LORATADINE 10 MG PO TABS: 10.0000 mg | ORAL_TABLET | Freq: Every day | ORAL | 5 refills | Status: DC

## 2017-06-19 NOTE — Telephone Encounter (Signed)
Scheduled appt per 10/4 los - unable to reach patient of leave message, sent reminder letter in the mail.

## 2017-06-19 NOTE — Progress Notes (Signed)
Subjective:    Patient ID: [name], female    DOB: 02/16/1933, 83 y.o.   MRN: [id number]  HPI  83 yo Severe COPD, HTN + diastolic dysfxn, hypoxemia, History of bladder cancer.         ROV 02/18/17 -- this follow-up visit for patient with a history of severe COPD, chronic hypoxemic respiratory failure, hypertension with diastolic dysfunction, bladder cancer. She has been managed on Symbicort and Incruse. She was seen in mid May and also 4 days ago with acute exacerbation, treated with antibiotics and prednisone.  She is having severe cough, non-productive. The prednisone that she just started has not seemed to help. Also Augmentin x 10 days. The sx have been present since mid May. She was asked to take mucinex, do flutter. She is taking zyrtec daily. She was changed from Incruse to Spiriva to see if the powder was a factor - there hasn't been any change.   06/19/17 -- pleasant 83 year old woman with a history of severe COPD, hypertension with diastolic CHF, associated hypoxemic respiratory failure for which she uses liters per minute of oxygen. Also with allergic rhinitis, chronic cough. Exacerbations apparently of COPD with superimposed upper airway disease as well. Also has been admitted for volume overload, most recent in February 2018. She underwent an echocardiogram on 06/06/17 that I reviewed. This showed giving it left ventricular hypertrophy with an intact LV ejection fraction consistent with grade 2 diastolic dysfunction and probable hypertrophic obstructive cardiomyopathy. Also suspected clinically significant aortic stenosis. Her estimated PA systolic pressure was 76 mm Hg.  Has had some increased edema R > L LE, beginning last 2 weeks. She is on 5mg  pred daily.    No flowsheet data found.   Review of Systems As per HPI  Past Medical History:  Diagnosis Date  . Allergic rhinitis   . Anemia 04/23/2012   "severe @ times; think caused by colonic AVMs"  . Anginal pain ([place])    "pressure"  . Arthritis    "probably"  . Asthma   . Breast cancer ([facility])   . COPD (chronic obstructive pulmonary disease) ([place])   . Coronary atherosclerosis of native coronary artery    Cath 1/11: RCA 50-60 ostial o/w normal. EF 70%.   . Dyspnea 04/23/2012   "all the time"  . Edema   . Emphysema   . Emphysema   . Gastric AVM   . H/O hiatal hernia   . Heart murmur   . History of blood transfusion    "many times"  . Hypertension   . Hypertr obst cardiomyop    Echo 10/10 EF 65-70% SW 1.6 PW 1.4 mild SAM no LVOT gradient at rest. Valsalva not performed Mild MR. No hyperenhance ment on MRI EF 69%.  . Hypothyroidism   . Occlusion and stenosis of carotid artery    s/p L CEA u/s 3/11. R 60-79%; L 40-59%. no change since 3/10  . Oxygen dependent 04/23/2012   "concentrated at night; liquid during day"  . Peripheral vascular disease, unspecified ([place])   . Pneumonia   . Pulmonary hypertension ([place])    Cath 1/11 RA 8, RV 43/6/14, PA 42/17 (29) PCWP 15 Fick  5.0/2.6. PVR 2.8    . Stroke [facility]) ~ 2004   denies residual on 04/23/2012     Family History  Problem Relation Age of Onset  . Emphysema Father   . Colon cancer Mother   . Stroke Mother   . COPD Sister   . Hypertension  Sister        Pulmonary  . Emphysema Sister   . Asthma Sister   . Asthma Sister   . Emphysema Brother        x2  . Colon cancer Brother      Social History   Social History  . Marital status: Single    Spouse name: N/A  . Number of children: N/A  . Years of education: N/A   Occupational History  . retired Retired    Physicist, medical for an apartment complex   Social History Main Topics  . Smoking status: Former Smoker    Packs/day: 1.00    Years: 30.00    Types: Cigarettes    Quit date: 09/16/1997  . Smokeless tobacco: Never Used  . Alcohol use No  . Drug use: No  . Sexual activity: Not Currently   Other Topics Concern  . Not on file   Social History Narrative   Single, no children    Lives with 2 sisters   Does not get regular exercise     Allergies  Allergen Reactions  . Latex Hives and Itching     Outpatient Medications Prior to Visit  Medication Sig Dispense Refill  . albuterol (PROVENTIL) (2.5 MG/3ML) 0.083% nebulizer solution Take 3 mLs (2.5 mg total) by nebulization every 6 (six) hours as needed for wheezing or shortness of breath. 75 mL 12  . ALPRAZolam (XANAX) 0.5 MG tablet Take 1 tablet (0.5 mg total) by mouth at bedtime as needed for anxiety or sleep. 30 tablet 0  . atorvastatin (LIPITOR) 20 MG tablet Take 20 mg by mouth daily.      . cholecalciferol (VITAMIN D) 1000 units tablet Take 1,000 Units by mouth daily.    Marland Kitchen dextromethorphan (DELSYM) 30 MG/5ML liquid Take 30 mg by mouth at bedtime as needed for cough.     . escitalopram (LEXAPRO) 20 MG tablet Take 20 mg by mouth daily.     . furosemide (LASIX) 40 MG tablet TAKE (1) TABLET BY MOUTH ONCE DAILY AS NEEDED FOR FLUID OR EDEMA 30 tablet 5  . guaiFENesin (MUCINEX) 600 MG 12 hr tablet Take 2 tablets (1,200 mg total) by mouth 2 (two) times daily. (Patient taking differently: Take 1,200 mg by mouth 2 (two) times daily as needed for cough. )    . HYDROcodone-acetaminophen (NORCO/VICODIN) 5-325 MG tablet Take 1 tablet by mouth every 4 (four) hours as needed for moderate pain. 30 tablet 0  . HYDROcodone-homatropine (HYCODAN) 5-1.5 MG/5ML syrup Take 5 mLs by mouth every 6 (six) hours as needed for cough. 240 mL 0  . INCRUSE ELLIPTA 62.5 MCG/INH AEPB Inhale 1 puff into the lungs daily.     Marland Kitchen ipratropium-albuterol (DUONEB) 0.5-2.5 (3) MG/3ML SOLN Inhale 3 mL by nebulization every six (6) hours as needed.    Marland Kitchen levothyroxine (SYNTHROID, LEVOTHROID) 150 MCG tablet Take 150 mcg by mouth daily before breakfast.     . metoprolol tartrate (LOPRESSOR) 25 MG tablet Take 25 mg by mouth 2 (two) times daily.    . Multiple Vitamin (MULTIVITAMIN WITH MINERALS) TABS tablet Take 1 tablet by mouth daily.    . pantoprazole (PROTONIX)  40 MG tablet Take 1 tablet (40 mg total) by mouth 2 (two) times daily. 60 tablet 3  . polyethylene glycol (MIRALAX / GLYCOLAX) packet Take 17 g by mouth daily as needed for mild constipation.    . predniSONE (DELTASONE) 10 MG tablet Take 4 tabs x 2 days, 3 tabs  x 2 days, 2 tabs x 2 days, 1 tab x 2 days, then stary 5 mg daily until seen by Dr. Lamonte Sakai 40 tablet 0  . SYMBICORT 160-4.5 MCG/ACT inhaler INHALE 2 PUFFS BY MOUTH TWICE DAILY 10.2 g 5  . tolterodine (DETROL LA) 2 MG 24 hr capsule Take 2 mg by mouth 2 (two) times daily.     . traZODone (DESYREL) 100 MG tablet Take 100 mg by mouth at bedtime.     . VENTOLIN HFA 108 (90 Base) MCG/ACT inhaler INHALE 2 PUFFS BY MOUTH EVERY 6 HOURS FOR SHORTNESS OF BREATH OR WHEEZING. 18 each 5  . VOLTAREN 1 % GEL Apply 2 g topically 4 (four) times daily as needed. Apply to hand for pain    . Tiotropium Bromide Monohydrate (SPIRIVA RESPIMAT) 2.5 MCG/ACT AERS 2 pffs each am 1 Inhaler 0  . levofloxacin (LEVAQUIN) 500 MG tablet      No facility-administered medications prior to visit.         Objective:   Physical Exam  Vitals:   06/19/17 1337  BP: (!) 102/52  Pulse: 80  SpO2: 93%  Weight: 144 lb (65.3 kg)  Height: 5\' 5"  (1.651 m)   Gen: Pleasant, well-nourished, in no distress,  normal affect  ENT: No lesions,  mouth clear,  oropharynx clear, Some nasal congestion  Neck: No JVD, no stridor  Lungs: No use of accessory muscles, bilateral coarse wheezes on expiration  Cardiovascular: RRR, heart sounds normal, no murmur or gallops, 1+ ankle peripheral edema  Musculoskeletal: No deformities, no cyanosis or clubbing  Neuro: alert, non focal  Skin: Warm, no lesions or rashes      Assessment & Plan:  COPD (chronic obstructive pulmonary disease) Please continue your current Incruse and Symbicort Continue prednisone 5 mg daily Continue your oxygen as you have been using it. Our goals are to keep your saturations greater than 88% at all  times. Follow-up with cardiology as planned. In particular discuss your heart valve function, your atrophic cardiomyopathy (HOCM), blood pressure control, diuretic dosing. Flu shot today Follow with Dr Lamonte Sakai in 3 months or sooner if you have any problems.  Allergic rhinitis Please start fluticasone nasal spray, 2 sprays each nostril once a day through the fall allergy season Please start loratadine 10 mg daily through the allergy season Consider using nasal saline rinses as needed during the fall months   Hypertrophic obstructive cardiomyopathy [facility]) Follow-up with cardiology as planned. In particular discuss your heart valve function, your atrophic cardiomyopathy (HOCM), blood pressure control, diuretic dosing.  Baltazar Apo, MD, PhD 06/19/2017, 5:19 PM [facility] [phone] or if no answer [phone]

## 2017-06-19 NOTE — Assessment & Plan Note (Signed)
Please start fluticasone nasal spray, 2 sprays each nostril once a day through the fall allergy season Please start loratadine 10 mg daily through the allergy season Consider using nasal saline rinses as needed during the fall months

## 2017-06-19 NOTE — Patient Instructions (Addendum)
Please start fluticasone nasal spray, 2 sprays each nostril once a day through the fall allergy season Please start loratadine 10 mg daily through the allergy season Consider using nasal saline rinses as needed during the fall months  Please continue your current Incruse and Symbicort Continue prednisone 5 mg daily Continue your oxygen as you have been using it. Our goals are to keep your saturations greater than 88% at all times. Follow-up with cardiology as planned. In particular discuss your heart valve function, your atrophic cardiomyopathy (HOCM), blood pressure control, diuretic dosing. Flu shot today Follow with Dr Lamonte Sakai in 3 months or sooner if you have any problems.

## 2017-06-19 NOTE — Assessment & Plan Note (Signed)
Please continue your current Incruse and Symbicort Continue prednisone 5 mg daily Continue your oxygen as you have been using it. Our goals are to keep your saturations greater than 88% at all times. Follow-up with cardiology as planned. In particular discuss your heart valve function, your atrophic cardiomyopathy (HOCM), blood pressure control, diuretic dosing. Flu shot today Follow with Dr Lamonte Sakai in 3 months or sooner if you have any problems.

## 2017-06-19 NOTE — Assessment & Plan Note (Signed)
Follow-up with cardiology as planned. In particular discuss your heart valve function, your atrophic cardiomyopathy (HOCM), blood pressure control, diuretic dosing.

## 2017-06-19 NOTE — Patient Instructions (Signed)

## 2017-06-19 NOTE — Telephone Encounter (Signed)
Per Dr Burr Medico - Order faxed to PCP - [name] MD to please continue to check CBC and ferritin every month and fax results to [place]. (Dr Burr Medico Fax # [phone])

## 2017-06-24 ENCOUNTER — Other Ambulatory Visit: Payer: Self-pay | Admitting: Emergency Medicine

## 2017-06-25 ENCOUNTER — Ambulatory Visit: Payer: Medicare Other | Admitting: Cardiology

## 2017-07-11 ENCOUNTER — Ambulatory Visit (INDEPENDENT_AMBULATORY_CARE_PROVIDER_SITE_OTHER): Payer: Medicare Other | Admitting: Interventional Cardiology

## 2017-07-11 ENCOUNTER — Encounter: Payer: Self-pay | Admitting: Interventional Cardiology

## 2017-07-11 VITALS — BP 104/60 | HR 87 | Ht 65.0 in | Wt 146.8 lb

## 2017-07-11 DIAGNOSIS — K5520 Angiodysplasia of colon without hemorrhage: Secondary | ICD-10-CM | POA: Diagnosis not present

## 2017-07-11 DIAGNOSIS — I25118 Atherosclerotic heart disease of native coronary artery with other forms of angina pectoris: Secondary | ICD-10-CM | POA: Diagnosis not present

## 2017-07-11 DIAGNOSIS — I5033 Acute on chronic diastolic (congestive) heart failure: Secondary | ICD-10-CM | POA: Diagnosis not present

## 2017-07-11 DIAGNOSIS — R0602 Shortness of breath: Secondary | ICD-10-CM | POA: Diagnosis not present

## 2017-07-11 DIAGNOSIS — J9611 Chronic respiratory failure with hypoxia: Secondary | ICD-10-CM

## 2017-07-11 DIAGNOSIS — I421 Obstructive hypertrophic cardiomyopathy: Secondary | ICD-10-CM

## 2017-07-11 DIAGNOSIS — I350 Nonrheumatic aortic (valve) stenosis: Secondary | ICD-10-CM

## 2017-07-11 DIAGNOSIS — I739 Peripheral vascular disease, unspecified: Secondary | ICD-10-CM | POA: Diagnosis not present

## 2017-07-11 DIAGNOSIS — J9612 Chronic respiratory failure with hypercapnia: Secondary | ICD-10-CM | POA: Diagnosis not present

## 2017-07-11 DIAGNOSIS — I639 Cerebral infarction, unspecified: Secondary | ICD-10-CM | POA: Diagnosis not present

## 2017-07-11 NOTE — Progress Notes (Signed)
Cardiology Office Note    Date:  07/11/2017   ID:  [name], DOB [birth year], MRN [id number]  PCP:  [name], MD  Cardiologist: Sinclair Grooms, MD   Chief Complaint  Patient presents with  . Advice Only    dyspnea  . Cardiac Valve Problem    ortic stenosis    History of Present Illness:  [name] is a 83 y.o. female referred or dyspnea, angina, history of moderate obstructive coronary disease (2011), COPD with pulmonary fibrosis., hypertrophic cardiomyopathy, history of multiple cerebrovascular accidents, obstructive carotid disease, peripheral vascular disease, and recurrent GI bleeding.  The patient has severe COPD, which is chronic oxygen requiring. Over the past several months she has had progressively more shortness of breath and is referred for an opinion concerning whether there is a contribution to the shortness of breath from her heart.  There is a long standing awareness by the patient and her family member that she has both aortic stenosis and hypertrophic obstructive cardiomyopathy. In 2017 while attempting preop clearance for bladder cancer, she had extensive evaluation performed at [facility] This included an echocardiogram and an MRI.there was also a cardiac catheterization.  Prior cardiac evaluation was done in the setting of preoperative clearance for surgical bladder removal. Now that she has undergone therapy for the bladder cancer, attention is being turned to her underlying cardiac condition with the question of whether progressive worsening and breathing has a significant cardiac component.   Past Medical History:  Diagnosis Date  . Allergic rhinitis   . Anemia 04/23/2012   "severe @ times; think caused by colonic AVMs"  . Anginal pain ([place])    "pressure"  . Arthritis    "probably"  . Asthma   . Breast cancer ([place])   . COPD (chronic obstructive pulmonary disease) ([place])   . Coronary atherosclerosis of native coronary artery      Cath 1/11: RCA 50-60 ostial o/w normal. EF 70%.   . Dyspnea 04/23/2012   "all the time"  . Edema   . Emphysema   . Emphysema   . Gastric AVM   . H/O hiatal hernia   . Heart murmur   . History of blood transfusion    "many times"  . Hypertension   . Hypertr obst cardiomyop    Echo 10/10 EF 65-70% SW 1.6 PW 1.4 mild SAM no LVOT gradient at rest. Valsalva not performed Mild MR. No hyperenhance ment on MRI EF 69%.  . Hypothyroidism   . Occlusion and stenosis of carotid artery    s/p L CEA u/s 3/11. R 60-79%; L 40-59%. no change since 3/10  . Oxygen dependent 04/23/2012   "concentrated at night; liquid during day"  . Peripheral vascular disease, unspecified ([place])   . Pneumonia   . Pulmonary hypertension ([place])    Cath 1/11 RA 8, RV 43/6/14, PA 42/17 (29) PCWP 15 Fick  5.0/2.6. PVR 2.8    . Stroke [facility]) ~ 2004   denies residual on 04/23/2012    Past Surgical History:  Procedure Laterality Date  . APPENDECTOMY  1950  . BREAST LUMPECTOMY  2002   right-hx of radiation  . CARDIAC CATHETERIZATION    . CAROTID ENDARTERECTOMY  2005   left  . CATARACT EXTRACTION W/ INTRAOCULAR LENS IMPLANT  02/2011   right  . COLONOSCOPY N/A 01/12/2014   Procedure: COLONOSCOPY;  Surgeon: Gatha Mayer, MD;  Location: [facility] ENDOSCOPY;  Service: Endoscopy;  Laterality: N/A;  MAC if  available  . DILATION AND CURETTAGE OF UTERUS  1950's  . EXCISIONAL HEMORRHOIDECTOMY  1950's  . IR VERTEBROPLASTY CERV/THOR BX INC UNI/BIL INC/INJECT/IMAGING  01/21/2017  . THYROIDECTOMY  2007    Current Medications: Outpatient Medications Prior to Visit  Medication Sig Dispense Refill  . albuterol (PROVENTIL) (2.5 MG/3ML) 0.083% nebulizer solution Take 3 mLs (2.5 mg total) by nebulization every 6 (six) hours as needed for wheezing or shortness of breath. 75 mL 12  . ALPRAZolam (XANAX) 0.5 MG tablet Take 1 tablet (0.5 mg total) by mouth at bedtime as needed for anxiety or sleep. 30 tablet 0  . atorvastatin (LIPITOR) 20 MG tablet  Take 20 mg by mouth daily.      . cholecalciferol (VITAMIN D) 1000 units tablet Take 1,000 Units by mouth daily.    Marland Kitchen dextromethorphan (DELSYM) 30 MG/5ML liquid Take 30 mg by mouth at bedtime as needed for cough.     . escitalopram (LEXAPRO) 20 MG tablet Take 20 mg by mouth daily.     . fluticasone (FLONASE) 50 MCG/ACT nasal spray Place 2 sprays into both nostrils daily. 16 g 5  . furosemide (LASIX) 40 MG tablet TAKE (1) TABLET BY MOUTH ONCE DAILY AS NEEDED FOR FLUID OR EDEMA 30 tablet 5  . INCRUSE ELLIPTA 62.5 MCG/INH AEPB Inhale 1 puff into the lungs daily.     Marland Kitchen ipratropium-albuterol (DUONEB) 0.5-2.5 (3) MG/3ML SOLN Inhale 3 mL by nebulization every six (6) hours as needed.    Marland Kitchen levothyroxine (SYNTHROID, LEVOTHROID) 150 MCG tablet Take 150 mcg by mouth daily before breakfast.     . metoprolol tartrate (LOPRESSOR) 25 MG tablet Take 25 mg by mouth 2 (two) times daily.    . Multiple Vitamin (MULTIVITAMIN WITH MINERALS) TABS tablet Take 1 tablet by mouth daily.    . pantoprazole (PROTONIX) 40 MG tablet Take 1 tablet (40 mg total) by mouth 2 (two) times daily. 60 tablet 3  . polyethylene glycol (MIRALAX / GLYCOLAX) packet Take 17 g by mouth daily as needed for mild constipation.    . SYMBICORT 160-4.5 MCG/ACT inhaler INHALE 2 PUFFS BY MOUTH TWICE DAILY 10.2 g 5  . tolterodine (DETROL LA) 2 MG 24 hr capsule Take 2 mg by mouth 2 (two) times daily.     . traZODone (DESYREL) 100 MG tablet Take 100 mg by mouth at bedtime.     . VENTOLIN HFA 108 (90 Base) MCG/ACT inhaler INHALE 2 PUFFS BY MOUTH EVERY 6 HOURS FOR SHORTNESS OF BREATH OR WHEEZING. 18 each 5  . guaiFENesin (MUCINEX) 600 MG 12 hr tablet Take 2 tablets (1,200 mg total) by mouth 2 (two) times daily. (Patient not taking: Reported on 07/11/2017)    . HYDROcodone-acetaminophen (NORCO/VICODIN) 5-325 MG tablet Take 1 tablet by mouth every 4 (four) hours as needed for moderate pain. (Patient not taking: Reported on 07/11/2017) 30 tablet 0  .  HYDROcodone-homatropine (HYCODAN) 5-1.5 MG/5ML syrup Take 5 mLs by mouth every 6 (six) hours as needed for cough. (Patient not taking: Reported on 07/11/2017) 240 mL 0  . INCRUSE ELLIPTA 62.5 MCG/INH AEPB INHALE 1 PUFF BY MOUTH ONCE DAILY AT BEDTIME. (Patient not taking: Reported on 07/11/2017) 30 each 2  . loratadine (CLARITIN) 10 MG tablet Take 1 tablet (10 mg total) by mouth daily. (Patient not taking: Reported on 07/11/2017) 30 tablet 5  . predniSONE (DELTASONE) 10 MG tablet Take 4 tabs x 2 days, 3 tabs x 2 days, 2 tabs x 2 days, 1 tab x 2  days, then stary 5 mg daily until seen by Dr. Lamonte Sakai (Patient not taking: Reported on 07/11/2017) 40 tablet 0  . VOLTAREN 1 % GEL Apply 2 g topically 4 (four) times daily as needed. Apply to hand for pain     No facility-administered medications prior to visit.      Allergies:   Latex   Social History   Social History  . Marital status: Single    Spouse name: N/A  . Number of children: N/A  . Years of education: N/A   Occupational History  . retired Retired    Physicist, medical for an apartment complex   Social History Main Topics  . Smoking status: Former Smoker    Packs/day: 1.00    Years: 30.00    Types: Cigarettes    Quit date: 09/16/1997  . Smokeless tobacco: Never Used  . Alcohol use No  . Drug use: No  . Sexual activity: Not Currently   Other Topics Concern  . None   Social History Narrative   Single, no children   Lives with 2 sisters   Does not get regular exercise     Family History:  The patient's family history includes Asthma in her sister and sister; COPD in her sister; Colon cancer in her brother and mother; Emphysema in her brother, father, and sister; Hypertension in her sister; Stroke in her mother.   ROS:   Please see the history of present illness.    Cough, hearing loss, shortness of breath with activity, orthopnea, PND, leg swelling, leg swelling is improved after diuresis. Excessive fatigue, wheezing,  constipation, difficulty with balance, and bleeding. No blood in the urine.  All other systems reviewed and are negative.   PHYSICAL EXAM:   VS:  BP 104/60 (BP Location: Left Arm)   Pulse 87   Ht 5' 5"  (1.651 m)   Wt 146 lb 12.8 oz (66.6 kg)   BMI 24.43 kg/m    GEN: Well nourished, well developed, in no acute distress . Wearing oxygen. Appearing frail. HEENT: normal  Neck: no JVD, carotid bruits, or masses Cardiac: RRR; 4/6 crescendo decrescendo systolic right upper sternal border and left midsternal border murmur.Maneuvers were not performed. No rubs, or gallops, with trace ankle edema. Respiratory:  clear to auscultation bilaterally, normal work of breathing GI: soft, nontender, nondistended, + BS MS: no deformity or atrophy  Skin: warm and dry, no rash Neuro:  Alert and Oriented x 3, Strength and sensation are intact Psych: euthymic mood, full affect  Wt Readings from Last 3 Encounters:  07/11/17 146 lb 12.8 oz (66.6 kg)  06/19/17 144 lb (65.3 kg)  06/19/17 142 lb (64.4 kg)      Studies/Labs Reviewed:   EKG:  EKG  Sinus rhythm, biatrial abnormality, prominent voltage with possible left ventricular hypertrophy, inferior and anterior old septal Q waves compatible with both prior inferior and anteroseptal infarctions.when compared to the prior tracing from December 2016 the only change is that of slow her heart rate on today's tracing.  Recent Labs: 10/17/2016: TSH 1.422 10/25/2016: Magnesium 2.9 06/19/2017: ALT 9; BUN 14.6; Creatinine 0.8; HGB 11.5; Platelets 210; Potassium 4.0; Sodium 142   Lipid Panel No results found for: CHOL, TRIG, HDL, CHOLHDL, VLDL, LDLCALC, LDLDIRECT  Additional studies/ records that were reviewed today include:   2-D Doppler echocardiogram 06/06/2017: Study Conclusions  - Left ventricle: The cavity size was normal. There was moderate   concentric and severe asymmetric hypertrophy of the septum.   Systolic  function was vigorous. The estimated  ejection fraction   was in the range of 65% to 70%. There was dynamic obstruction at   restin the mid cavity, with mid-cavity obliteration and a peak   gradient of 108 mm Hg. Wall motion was normal; there were no   regional wall motion abnormalities. Features are consistent with   a pseudonormal left ventricular filling pattern, with concomitant   abnormal relaxation and increased filling pressure (grade 2   diastolic dysfunction). Doppler parameters are consistent with   high ventricular filling pressure. - Aortic valve: There appears to be a significant gradient across   the AV but there is also a significant LVOT gradient due to HOCM.   By mean AVG and peak AV velocity it appears as though aortic   stenosis is severe. Valve mobility was restricted. Mean gradient   (S): 91 mm Hg. Peak gradient (S): 150 mm Hg. - Mitral valve: Severely calcified annulus. Moderate diffuse   calcification of the anterior leaflet and posterior leaflet, with   moderate involvement of chords. There was severe systolic   anterior motion of the anterior leaflet. The findings are   consistent with mild to moderate stenosis. There was mild   regurgitation. Mean gradient (D): 16 mm Hg. Valve area by   pressure half-time: 2.53 cm^2. - Left atrium: The atrium was severely dilated. - Tricuspid valve: There was moderate-severe regurgitation. - Pulmonary arteries: PA peak pressure: 76 mm Hg (S). - Recommendations: Recommend TEE for better assessment of AV and   degree of AS as well as further evaluation of mitral valve.  Impressions:  - The right ventricular systolic pressure was increased consistent   with severe pulmonary hypertension.  Recommendations:  Recommend TEE for better assessment of AV and degree of AS as well as further evaluation of mitral valve.  03/06/2016 Coronary Angiography at [facility]: Findings:  Mild-moderate coronary artery disease as described below.  Normal LV Function by  previous echo  Moderate pulmonary hypertension  Moderate aortic stenosis with caculated AVA approximately 1.1cm2   Severe, dynamic LVOT obstruction with resting peak gradient  approximately 100 mm Hg   Cardiac MRI 03/08/2016 at [facility]: IMPRESSION:  --Hypertrophic obstructive cardiomyopathy with asymmetric basal septal hypertrophy, systolic anterior motion of the mitral valve, posteriorly directed mitral regurgitation with left atrial dilatation, and marked turbulence of flow at the level of the bicuspid aortic valve.  -- Congenitally malformed bicuspid aortic valve with fusion of the noncoronary and left coronary cusp. Peak velocity of 2.6 m/s. Peak velocity of the subvalvular level measures 2.2 m/s. In plane imaging demonstrates maximal jets at the valvular level. --No delayed post-contrast enhancement to suggest infarction or infiltrative disease.    ASSESSMENT:    1. DYSPNEA   2. Hypertrophic obstructive cardiomyopathy ([place])   3. Aortic stenosis, severe   4. Diastolic CHF, acute on chronic (HCC)   5. Chronic respiratory failure with hypoxia and hypercapnia (HCC)   6. Atherosclerosis of native coronary artery of native heart with stable angina pectoris ([place])   7. Intestinal angiodysplasia   8. Cerebrovascular accident (CVA), unspecified mechanism ([place])   9. PVD      PLAN:  In order of problems listed above:  1. Likely mixed etiology due to COPD/Pulmonary Fibrosis and diastolic heart failure due to hypertrophic cardiomyopathy and aortic stenosis.  2. Previously documented by MRI at [facility] in 2017 and felt to be the most severe outflow lesion. 3. Appears severe currently but data could be  misleading/erroneous in setting of dynamic LVOF obstruction. 4. There is no gross volume overload. 5. Significant and O2 dependent. 6. Not significant 1 year ago, 7. Prior slow GI bleeding.  Overall, we discussed management heart lesions. Therapy could include medical therapy, but  already at limit due to low BP. Percutaneous therapy may include alcohol septal ablation and/or TAVR in staged approach. TAVR could be technically difficult due to basal septal hypertrophy. Surgical approach could include sugical AVR and septal myectomy. Surgery is probably not possible due to lung disease. Prognosis ia poor with medical therapy. After discussion of options, she is not interested in invasive therapy - catheter based or surgical. We then discussed DNR and Hospice /Palliative care approach. She will consider her options. I offered referral to Center with alcohol septal ablation experience.. Currently prefers conservative approach.  Extended consult with > 50% of time spent in counseling and coordination of care.  Medication Adjustments/Labs and Tests Ordered: Current medicines are reviewed at length with the patient today.  Concerns regarding medicines are outlined above.  Medication changes, Labs and Tests ordered today are listed in the Patient Instructions below. Patient Instructions  Medication Instructions:  Your physician recommends that you continue on your current medications as directed. Please refer to the Current Medication list given to you today.  Labwork: None  Testing/Procedures: None  Follow-Up: Your physician recommends that you schedule a follow-up appointment as needed with Dr. Tamala Julian.    Any Other Special Instructions Will Be Listed Below (If Applicable).     If you need a refill on your cardiac medications before your next appointment, please call your pharmacy.      Signed, Sinclair Grooms, MD  07/11/2017 1:48 PM    [facility] [address], [place], [place]  [address] Phone: [phone]; Fax: [phone]

## 2017-07-11 NOTE — Patient Instructions (Signed)
Medication Instructions:  Your physician recommends that you continue on your current medications as directed. Please refer to the Current Medication list given to you today.  Labwork: None  Testing/Procedures: None  Follow-Up: Your physician recommends that you schedule a follow-up appointment as needed with Dr. Smith.     Any Other Special Instructions Will Be Listed Below (If Applicable).     If you need a refill on your cardiac medications before your next appointment, please call your pharmacy.   

## 2017-08-04 ENCOUNTER — Telehealth: Payer: Self-pay | Admitting: Emergency Medicine

## 2017-08-04 MED ORDER — PREDNISONE 10 MG PO TABS: ORAL_TABLET | ORAL | 0 refills | Status: DC

## 2017-08-04 NOTE — Telephone Encounter (Signed)
atc [name], no answer and vm full.   Called and spoke to pt, states she is feeling much better and does not wish to have an appt scheduled at this time. Sent in pred taper to preferred pharmacy.    Will call back to keep  [name] (niece) aware.

## 2017-08-04 NOTE — Telephone Encounter (Signed)
Spoke with [name] and she states she talked to Dr. Elsworth Soho on Saturday and there were supposed to be 2 prescriptions called in. The levaquin was sent but the prednisone.was not sent in. She also wanted to see if it would be a good idea for pt to come in to be evaluated since the holidays is approaching. RA please advise.    [employer]

## 2017-08-04 NOTE — Telephone Encounter (Signed)
ATC pt, no answer. Left message for pt to call back.  

## 2017-08-04 NOTE — Telephone Encounter (Signed)
Since she was on chronic 5 mg of prednisone, and advised her to take 8 tablets which would be 40 mg  But okay to send prescription for Prednisone 10 mg tablets -4 tablets -taper by 1 tablet every day until back down to her usual dose of 5 mg. Arrange for office visit with NP over the next 2 days

## 2017-08-05 NOTE — Telephone Encounter (Signed)
Melissa returned call and was made aware the patient declined an appt at this time and prednisone has been called in to pharmacy.  She understood and stated she did not need a call back.

## 2017-08-18 ENCOUNTER — Other Ambulatory Visit: Payer: Self-pay | Admitting: Emergency Medicine

## 2017-09-02 ENCOUNTER — Telehealth: Payer: Self-pay | Admitting: *Deleted

## 2017-09-02 NOTE — Telephone Encounter (Signed)
Received call from niece Su Hoff re:  Pt was admitted yesterday to a local hospital with hemorrhage; pt is on ventilator, and in ICU.  Hgb was 6. Melissa wanted to know if Dr. Burr Medico would take over pt's care, have pt transferred to either [facility] or [facility], and assumes all the care for pt for continuity. Melissa's  Phone    [phone].

## 2017-09-02 NOTE — Telephone Encounter (Signed)
I called and left a message. I suggest Melissa to talk to the hospital doctor there to guide her about if pt needs to be transferred.   Truitt Merle MD

## 2017-09-08 ENCOUNTER — Inpatient Hospital Stay
Admission: EM | Admit: 2017-09-08 | Discharge: 2017-09-26 | Disposition: A | Discharge status: Home / Self Care | Payer: Self-pay | Source: Other Acute Inpatient Hospital | Attending: Internal Medicine | Admitting: Internal Medicine

## 2017-09-08 ENCOUNTER — Telehealth: Payer: Self-pay | Admitting: Emergency Medicine

## 2017-09-08 ENCOUNTER — Inpatient Hospital Stay
Admission: EM | Admit: 2017-09-08 | Payer: Medicare Other | Source: Other Acute Inpatient Hospital | Admitting: Internal Medicine

## 2017-09-08 DIAGNOSIS — J9690 Respiratory failure, unspecified, unspecified whether with hypoxia or hypercapnia: Secondary | ICD-10-CM

## 2017-09-08 DIAGNOSIS — J811 Chronic pulmonary edema: Secondary | ICD-10-CM

## 2017-09-08 NOTE — Telephone Encounter (Signed)
Spoke with the pt's niece Melissa and notified of recs per TP  She verbalized understanding  She states that pt's case manager advised her to call here for hospital transfer to [facility]   I spoke with TP about this  She states that the way this works is the docs there have to review her case and then call and speak with our docs about transfer.  I tried calling her back to tell her this and had to [facility]

## 2017-09-08 NOTE — Telephone Encounter (Signed)
Called and spoke with pt's niece, [name]. Melissa states pt is currently admitted at [facility] for COPD. Melissa states pt was placed on vent and is now on bipap. Melissa states provider at [facility]. Melissa would like to know if our providers are able to [facility] and if they agree with this decision. [name] is requesting a call directly from TP, as she is DOD and RB is unavailable.  TP please advise. Thanks.

## 2017-09-08 NOTE — Telephone Encounter (Signed)
Spoke with [employer] and notified of recs per TP  She verbalized understanding of this  Nothing further needed

## 2017-09-08 NOTE — Telephone Encounter (Signed)
Pt niece [name] is returning phone call from this office. Cb [phone].

## 2017-09-08 NOTE — Telephone Encounter (Signed)
So sorry to hear this . Tried to call .  Our doctors do not see pt at [facility] .  It may be fine to transfer if she need more intensive rehab, hard to deternine without knowing all the details.  Can wait for Dr. Lamonte Sakai  To get back in office to answer .

## 2017-09-08 NOTE — Telephone Encounter (Signed)
LMTCB

## 2017-09-08 NOTE — Telephone Encounter (Signed)
Neice wants to speak with doctor about this to be sure she will be cared for by our doctor's while at [facility] and if this is the best decision for the patient. She is aware Dr. Lamonte Sakai is on vacation until next week.

## 2017-09-09 ENCOUNTER — Other Ambulatory Visit (HOSPITAL_COMMUNITY): Payer: Self-pay

## 2017-09-09 LAB — COMPREHENSIVE METABOLIC PANEL
ALBUMIN: 2.8 g/dL — AB (ref 3.5–5.0)
ALK PHOS: 65 U/L (ref 38–126)
ALT: 15 U/L (ref 14–54)
ANION GAP: 7 (ref 5–15)
AST: 18 U/L (ref 15–41)
BILIRUBIN TOTAL: 0.5 mg/dL (ref 0.3–1.2)
BUN: 11 mg/dL (ref 6–20)
CALCIUM: 8.5 mg/dL — AB (ref 8.9–10.3)
CO2: 35 mmol/L — ABNORMAL HIGH (ref 22–32)
Chloride: 98 mmol/L — ABNORMAL LOW (ref 101–111)
Creatinine, Ser: 0.56 mg/dL (ref 0.44–1.00)
GFR calc Af Amer: >60 mL/min (ref >60)
GFR calc non Af Amer: >60 mL/min (ref >60)
GLUCOSE: 92 mg/dL (ref 65–99)
Potassium: 3.7 mmol/L (ref 3.5–5.1)
Sodium: 140 mmol/L (ref 135–145)
TOTAL PROTEIN: 5.7 g/dL — AB (ref 6.5–8.1)

## 2017-09-09 LAB — CBC
HCT: 34 % — ABNORMAL LOW (ref 36.0–46.0)
Hemoglobin: 9.9 g/dL — ABNORMAL LOW (ref 12.0–15.0)
MCH: 28.6 pg (ref 26.0–34.0)
MCHC: 29.1 g/dL — AB (ref 30.0–36.0)
MCV: 98.3 fL (ref 78.0–100.0)
PLATELETS: 309 10*3/uL (ref 150–400)
RBC: 3.46 MIL/uL — ABNORMAL LOW (ref 3.87–5.11)
RDW: 15 % (ref 11.5–15.5)
WBC: 6.2 10*3/uL (ref 4.0–10.5)

## 2017-09-10 LAB — BASIC METABOLIC PANEL
Anion gap: 11 (ref 5–15)
BUN: 10 mg/dL (ref 6–20)
CALCIUM: 8.1 mg/dL — AB (ref 8.9–10.3)
CO2: 39 mmol/L — ABNORMAL HIGH (ref 22–32)
CREATININE: 0.57 mg/dL (ref 0.44–1.00)
Chloride: 88 mmol/L — ABNORMAL LOW (ref 101–111)
GFR calc non Af Amer: >60 mL/min (ref >60)
Glucose, Bld: 109 mg/dL — ABNORMAL HIGH (ref 65–99)
Potassium: 3.3 mmol/L — ABNORMAL LOW (ref 3.5–5.1)
SODIUM: 138 mmol/L (ref 135–145)

## 2017-09-11 LAB — POTASSIUM: POTASSIUM: 3.3 mmol/L — AB (ref 3.5–5.1)

## 2017-09-13 LAB — CBC
HEMATOCRIT: 33.2 % — AB (ref 36.0–46.0)
Hemoglobin: 9.6 g/dL — ABNORMAL LOW (ref 12.0–15.0)
MCH: 28.2 pg (ref 26.0–34.0)
MCHC: 28.9 g/dL — ABNORMAL LOW (ref 30.0–36.0)
MCV: 97.4 fL (ref 78.0–100.0)
Platelets: 315 10*3/uL (ref 150–400)
RBC: 3.41 MIL/uL — ABNORMAL LOW (ref 3.87–5.11)
RDW: 15.2 % (ref 11.5–15.5)
WBC: 7.6 10*3/uL (ref 4.0–10.5)

## 2017-09-13 LAB — BASIC METABOLIC PANEL
ANION GAP: 11 (ref 5–15)
BUN: 19 mg/dL (ref 6–20)
CALCIUM: 8.4 mg/dL — AB (ref 8.9–10.3)
CO2: 43 mmol/L — ABNORMAL HIGH (ref 22–32)
Chloride: 85 mmol/L — ABNORMAL LOW (ref 101–111)
Creatinine, Ser: 0.62 mg/dL (ref 0.44–1.00)
GFR calc Af Amer: >60 mL/min (ref >60)
Glucose, Bld: 114 mg/dL — ABNORMAL HIGH (ref 65–99)
POTASSIUM: 3.6 mmol/L (ref 3.5–5.1)
SODIUM: 139 mmol/L (ref 135–145)

## 2017-09-14 ENCOUNTER — Other Ambulatory Visit (HOSPITAL_COMMUNITY): Payer: Self-pay

## 2017-09-17 LAB — BASIC METABOLIC PANEL
ANION GAP: 4 — AB (ref 5–15)
BUN: 17 mg/dL (ref 6–20)
CO2: 43 mmol/L — ABNORMAL HIGH (ref 22–32)
Calcium: 7.9 mg/dL — ABNORMAL LOW (ref 8.9–10.3)
Chloride: 92 mmol/L — ABNORMAL LOW (ref 101–111)
Creatinine, Ser: 0.63 mg/dL (ref 0.44–1.00)
Glucose, Bld: 134 mg/dL — ABNORMAL HIGH (ref 65–99)
POTASSIUM: 3.6 mmol/L (ref 3.5–5.1)
SODIUM: 139 mmol/L (ref 135–145)

## 2017-09-17 LAB — CBC
HEMATOCRIT: 32.5 % — AB (ref 36.0–46.0)
Hemoglobin: 9.5 g/dL — ABNORMAL LOW (ref 12.0–15.0)
MCH: 28.6 pg (ref 26.0–34.0)
MCHC: 29.2 g/dL — ABNORMAL LOW (ref 30.0–36.0)
MCV: 97.9 fL (ref 78.0–100.0)
Platelets: 287 10*3/uL (ref 150–400)
RBC: 3.32 MIL/uL — ABNORMAL LOW (ref 3.87–5.11)
RDW: 15.6 % — AB (ref 11.5–15.5)
WBC: 10.2 10*3/uL (ref 4.0–10.5)

## 2017-09-21 LAB — BASIC METABOLIC PANEL
ANION GAP: 6 (ref 5–15)
BUN: 18 mg/dL (ref 6–20)
CHLORIDE: 90 mmol/L — AB (ref 101–111)
CO2: 40 mmol/L — AB (ref 22–32)
Calcium: 8.2 mg/dL — ABNORMAL LOW (ref 8.9–10.3)
Creatinine, Ser: 0.66 mg/dL (ref 0.44–1.00)
GFR calc non Af Amer: >60 mL/min (ref >60)
GLUCOSE: 105 mg/dL — AB (ref 65–99)
Potassium: 3.8 mmol/L (ref 3.5–5.1)
Sodium: 136 mmol/L (ref 135–145)

## 2017-09-21 LAB — CBC
HCT: 33.8 % — ABNORMAL LOW (ref 36.0–46.0)
Hemoglobin: 9.8 g/dL — ABNORMAL LOW (ref 12.0–15.0)
MCH: 27.7 pg (ref 26.0–34.0)
MCHC: 29 g/dL — AB (ref 30.0–36.0)
MCV: 95.5 fL (ref 78.0–100.0)
Platelets: 289 10*3/uL (ref 150–400)
RBC: 3.54 MIL/uL — AB (ref 3.87–5.11)
RDW: 15.8 % — ABNORMAL HIGH (ref 11.5–15.5)
WBC: 11.7 10*3/uL — ABNORMAL HIGH (ref 4.0–10.5)

## 2017-09-23 LAB — CBC
HCT: 35.5 % — ABNORMAL LOW (ref 36.0–46.0)
HEMOGLOBIN: 10.3 g/dL — AB (ref 12.0–15.0)
MCH: 28.1 pg (ref 26.0–34.0)
MCHC: 29 g/dL — AB (ref 30.0–36.0)
MCV: 96.7 fL (ref 78.0–100.0)
Platelets: 271 10*3/uL (ref 150–400)
RBC: 3.67 MIL/uL — ABNORMAL LOW (ref 3.87–5.11)
RDW: 16.2 % — ABNORMAL HIGH (ref 11.5–15.5)
WBC: 9.8 10*3/uL (ref 4.0–10.5)

## 2017-09-25 ENCOUNTER — Ambulatory Visit: Payer: Medicare Other | Admitting: Emergency Medicine

## 2017-10-21 ENCOUNTER — Other Ambulatory Visit: Payer: Self-pay | Admitting: Emergency Medicine

## 2017-12-18 ENCOUNTER — Ambulatory Visit: Payer: Medicare Other | Admitting: Hematology

## 2017-12-18 ENCOUNTER — Other Ambulatory Visit: Payer: Medicare Other

## 2017-12-18 ENCOUNTER — Ambulatory Visit: Payer: Medicare Other

## 2017-12-31 ENCOUNTER — Other Ambulatory Visit: Payer: Self-pay | Admitting: Emergency Medicine

## 2018-01-30 ENCOUNTER — Inpatient Hospital Stay: Payer: Medicare Other | Attending: Hematology

## 2018-01-30 ENCOUNTER — Inpatient Hospital Stay: Payer: Medicare Other | Admitting: Hematology

## 2018-01-30 ENCOUNTER — Inpatient Hospital Stay: Payer: Medicare Other

## 2018-02-13 ENCOUNTER — Telehealth: Payer: Self-pay | Admitting: Hematology

## 2018-02-13 ENCOUNTER — Telehealth: Payer: Self-pay

## 2018-02-13 NOTE — Telephone Encounter (Signed)
I received patient's outside lab which was done on Feb 05, 2018.  Her CBC showed hemoglobin 11.1, hematocrit 36.6%, MCV 90, rest of CBC was unremarkable.  Ferritin 23.  I will set up IV Feraheme weekly X2 in the next 2 to 3 weeks, I have sent a scheduling message.  Truitt Merle  02/13/2018

## 2018-02-13 NOTE — Telephone Encounter (Signed)
Melissa Amith called stating that the patient's ferritin level was 23 last Thursday (02/05/18) Wants to know if she needs to be scheduled for IV iron.

## 2018-02-13 NOTE — Telephone Encounter (Signed)
Malachy Mood, could you get her outside lab report (from her PCP?) for me? Thanks   Truitt Merle MD

## 2018-02-13 NOTE — Telephone Encounter (Signed)
Yes, I will send a schedule message for her iv feraheme weekly X2, please let pt know.   Truitt Merle MD

## 2018-02-16 ENCOUNTER — Telehealth: Payer: Self-pay

## 2018-02-16 ENCOUNTER — Inpatient Hospital Stay: Payer: Medicare Other | Attending: Hematology

## 2018-02-16 VITALS — BP 133/52 | HR 79 | Temp 98.1°F | Resp 20

## 2018-02-16 DIAGNOSIS — Z79899 Other long term (current) drug therapy: Secondary | ICD-10-CM | POA: Diagnosis not present

## 2018-02-16 DIAGNOSIS — K5521 Angiodysplasia of colon with hemorrhage: Secondary | ICD-10-CM

## 2018-02-16 DIAGNOSIS — D500 Iron deficiency anemia secondary to blood loss (chronic): Secondary | ICD-10-CM | POA: Diagnosis present

## 2018-02-16 MED ORDER — SODIUM CHLORIDE 0.9 % IV SOLN
INTRAVENOUS | Status: DC
  Administered 2018-02-16: 12:00:00 via INTRAVENOUS

## 2018-02-16 MED ORDER — SODIUM CHLORIDE 0.9 % IV SOLN
510.0000 mg | INTRAVENOUS | Status: DC
  Administered 2018-02-16: 510 mg via INTRAVENOUS
  Filled 2018-02-16: qty 17

## 2018-02-16 NOTE — Telephone Encounter (Signed)
Per 6/3 phone que to schedule patient for iron. Verified sch msg and spoke with charge RN and will receive infusion taday and next week.

## 2018-02-16 NOTE — Patient Instructions (Signed)

## 2018-02-23 ENCOUNTER — Inpatient Hospital Stay: Payer: Medicare Other

## 2018-02-23 VITALS — BP 136/49 | HR 88 | Temp 98.5°F | Resp 19

## 2018-02-23 DIAGNOSIS — K5521 Angiodysplasia of colon with hemorrhage: Secondary | ICD-10-CM

## 2018-02-23 DIAGNOSIS — D500 Iron deficiency anemia secondary to blood loss (chronic): Secondary | ICD-10-CM | POA: Diagnosis not present

## 2018-02-23 MED ORDER — FERUMOXYTOL INJECTION 510 MG/17 ML
510.0000 mg | INTRAVENOUS | Status: DC
  Administered 2018-02-23: 510 mg via INTRAVENOUS
  Filled 2018-02-23: qty 17

## 2018-02-24 ENCOUNTER — Emergency Department (HOSPITAL_COMMUNITY)
Admission: EM | Admit: 2018-02-24 | Discharge: 2018-02-25 | Disposition: A | Discharge status: Home / Self Care | Payer: Medicare Other | Attending: Emergency Medicine | Admitting: Emergency Medicine

## 2018-02-24 ENCOUNTER — Encounter (HOSPITAL_COMMUNITY): Payer: Self-pay

## 2018-02-24 ENCOUNTER — Encounter: Payer: Self-pay | Admitting: Pulmonary Disease

## 2018-02-24 ENCOUNTER — Other Ambulatory Visit: Payer: Self-pay

## 2018-02-24 ENCOUNTER — Ambulatory Visit (INDEPENDENT_AMBULATORY_CARE_PROVIDER_SITE_OTHER): Payer: Medicare Other | Admitting: Pulmonary Disease

## 2018-02-24 ENCOUNTER — Other Ambulatory Visit: Payer: Self-pay | Admitting: Adult Health

## 2018-02-24 ENCOUNTER — Emergency Department (HOSPITAL_COMMUNITY): Payer: Medicare Other

## 2018-02-24 DIAGNOSIS — J449 Chronic obstructive pulmonary disease, unspecified: Secondary | ICD-10-CM | POA: Insufficient documentation

## 2018-02-24 DIAGNOSIS — I5033 Acute on chronic diastolic (congestive) heart failure: Secondary | ICD-10-CM | POA: Diagnosis not present

## 2018-02-24 DIAGNOSIS — R609 Edema, unspecified: Secondary | ICD-10-CM | POA: Diagnosis not present

## 2018-02-24 DIAGNOSIS — R05 Cough: Secondary | ICD-10-CM | POA: Diagnosis not present

## 2018-02-24 DIAGNOSIS — R059 Cough, unspecified: Secondary | ICD-10-CM

## 2018-02-24 DIAGNOSIS — Z5321 Procedure and treatment not carried out due to patient leaving prior to being seen by health care provider: Secondary | ICD-10-CM | POA: Insufficient documentation

## 2018-02-24 HISTORY — DX: Heart failure, unspecified: I50.9

## 2018-02-24 LAB — BASIC METABOLIC PANEL
Anion gap: 11 (ref 5–15)
BUN: 16 mg/dL (ref 6–20)
CO2: 36 mmol/L — ABNORMAL HIGH (ref 22–32)
Calcium: 8.8 mg/dL — ABNORMAL LOW (ref 8.9–10.3)
Chloride: 95 mmol/L — ABNORMAL LOW (ref 101–111)
Creatinine, Ser: 0.89 mg/dL (ref 0.44–1.00)
GFR, EST NON AFRICAN AMERICAN: 58 mL/min — AB (ref >60)
GLUCOSE: 102 mg/dL — AB (ref 65–99)
POTASSIUM: 4.6 mmol/L (ref 3.5–5.1)
Sodium: 142 mmol/L (ref 135–145)

## 2018-02-24 LAB — BRAIN NATRIURETIC PEPTIDE: B NATRIURETIC PEPTIDE 5: 205.5 pg/mL — AB (ref 0.0–100.0)

## 2018-02-24 LAB — CBC
HEMATOCRIT: 36.7 % (ref 36.0–46.0)
HEMOGLOBIN: 10.8 g/dL — AB (ref 12.0–15.0)
MCH: 27.6 pg (ref 26.0–34.0)
MCHC: 29.4 g/dL — AB (ref 30.0–36.0)
MCV: 93.6 fL (ref 78.0–100.0)
Platelets: 292 10*3/uL (ref 150–400)
RBC: 3.92 MIL/uL (ref 3.87–5.11)
RDW: 18.2 % — ABNORMAL HIGH (ref 11.5–15.5)
WBC: 11.5 10*3/uL — AB (ref 4.0–10.5)

## 2018-02-24 LAB — I-STAT TROPONIN, ED: TROPONIN I, POC: 0 ng/mL (ref 0.00–0.08)

## 2018-02-24 NOTE — Patient Instructions (Signed)
Recommend that you go directly to the [facility] ED >>> We will call and let them know that you are coming >>> Cardiology needs to see and consult with you there  We will see you for hospital follow-up after that appointment     Please contact the office if your symptoms worsen or you have concerns that you are not improving.   Thank you for choosing [facility] for your healthcare, and for allowing [place] to partner with you on your healthcare journey. I am thankful to be able to provide care to you today.   Wyn Quaker FNP-C

## 2018-02-24 NOTE — Assessment & Plan Note (Signed)
Recommend that you go directly to the [name], ED >>> We will call and let them know that you are coming >>> Cardiology needs to see and consult with you there  We will see you for hospital follow-up after that appointment

## 2018-02-24 NOTE — Progress Notes (Signed)
@Patient  ID: [name], female    DOB: 02-15-1933, 84 y.o.   MRN: [id number]  Chief Complaint  Patient presents with  . Acute Visit    cough x 1 week, has had levaquin and took last prednisone this morning. Using symbicort daily and nebulizer 4x/day.     Referring provider: [name], MD  HPI: 84 yo Severe COPD, HTN + diastolic dysfxn, hypoxemia, History of bladder cancer.         Recent [facility] Encounters:   ROV 02/18/17 -- this follow-up visit for patient with a history of severe COPD, chronic hypoxemic respiratory failure, hypertension with diastolic dysfunction, bladder cancer. She has been managed on Symbicort and Incruse. She was seen in mid May and also 4 days ago with acute exacerbation, treated with antibiotics and prednisone.  She is having severe cough, non-productive. The prednisone that she just started has not seemed to help. Also Augmentin x 10 days. The sx have been present since mid May. She was asked to take mucinex, do flutter. She is taking zyrtec daily. She was changed from Incruse to Spiriva to see if the powder was a factor - there hasn't been any change.   06/19/17 -- pleasant 84 year old woman with a history of severe COPD, hypertension with diastolic CHF, associated hypoxemic respiratory failure for which she uses liters per minute of oxygen. Also with allergic rhinitis, chronic cough. Exacerbations apparently of COPD with superimposed upper airway disease as well. Also has been admitted for volume overload, most recent in February 2018. She underwent an echocardiogram on 06/06/17 that I reviewed. This showed giving it left ventricular hypertrophy with an intact LV ejection fraction consistent with grade 2 diastolic dysfunction and probable hypertrophic obstructive cardiomyopathy. Also suspected clinically significant aortic stenosis. Her estimated PA systolic pressure was 76 mm Hg.  Has had some increased edema R > L LE, beginning last 2 weeks. She  is on 5mg  pred daily.     02/24/18 Acute  84 year old female patient seen today in office for acute visit.  Patient presents with family members as well.  They reporting that 2 weeks ago patient was seen by [facility] cardiology team and they wanted to admit the patient due to lower extremity swelling and fluid overload.  Patient was reported to have poor air movement as well as decompensated state.  Patient refused this of the time his sister was on hospice and was not expected to live much longer.  Sister recently passed last Thursday.    Presenting today for cough that sounds wet but is not productive.  Patient is adamant that she does not want to go inpatient and does not want to have to go back to the hospital.  Patient has extensive past medical history of needing hospitalizations to be accurately diuresed as well as to manage her hypercarbia.  Patient admits she has not been taking her Lasix as prescribed as initially discussed with her cardiologist 2 weeks ago.  Unfortunately patient has not established with a local cardiology team per patient and family.  Patient says that they just recently finished Levaquin as well as a prednisone taper and does not feel like she usually feels after taking it.  As this treatment according to the patient usually works and makes her feel better.  Unfortunately as well due to cough patient has been not adherent to her BiPAP.  The fears of potential the patient may have hypercarbia again patient has not been able to use her BiPAP in the  last week.    Allergies  Allergen Reactions  . Latex Hives and Itching    Immunization History  Administered Date(s) Administered  . Influenza Split 06/04/2011, 06/01/2012, 05/31/2013  . Influenza Whole 06/16/2009, 08/14/2010  . Influenza, High Dose Seasonal PF 08/29/2016, 06/19/2017  . Influenza-Unspecified 07/04/2014, 06/16/2015  . Pneumococcal Conjugate-13 10/08/2013  . Pneumococcal Polysaccharide-23 07/27/2009  .  Tdap 06/17/2011    Past Medical History:  Diagnosis Date  . Allergic rhinitis   . Anemia 04/23/2012   "severe @ times; think caused by colonic AVMs"  . Anginal pain ([place])    "pressure"  . Arthritis    "probably"  . Asthma   . Breast cancer ([place])   . CHF (congestive heart failure) ([place])   . COPD (chronic obstructive pulmonary disease) ([place])   . Coronary atherosclerosis of native coronary artery    Cath 1/11: RCA 50-60 ostial o/w normal. EF 70%.   . Dyspnea 04/23/2012   "all the time"  . Edema   . Emphysema   . Emphysema   . Gastric AVM   . H/O hiatal hernia   . Heart murmur   . History of blood transfusion    "many times"  . Hypertension   . Hypertr obst cardiomyop    Echo 10/10 EF 65-70% SW 1.6 PW 1.4 mild SAM no LVOT gradient at rest. Valsalva not performed Mild MR. No hyperenhance ment on MRI EF 69%.  . Hypothyroidism   . Occlusion and stenosis of carotid artery    s/p L CEA u/s 3/11. R 60-79%; L 40-59%. no change since 3/10  . Oxygen dependent 04/23/2012   "concentrated at night; liquid during day"  . Peripheral vascular disease, unspecified ([place])   . Pneumonia   . Pulmonary hypertension ([place])    Cath 1/11 RA 8, RV 43/6/14, PA 42/17 (29) PCWP 15 Fick  5.0/2.6. PVR 2.8    . Stroke [facility]) ~ 2004   denies residual on 04/23/2012    Tobacco History: Social History   Tobacco Use  Smoking Status Former Smoker  . Packs/day: 1.00  . Years: 30.00  . Pack years: 30.00  . Types: Cigarettes  . Last attempt to quit: 09/16/1997  . Years since quitting: 20.4  Smokeless Tobacco Never Used   Counseling given: Yes   Outpatient Encounter Medications as of 02/24/2018  Medication Sig  . albuterol (PROVENTIL) (2.5 MG/3ML) 0.083% nebulizer solution Take 3 mLs (2.5 mg total) by nebulization every 6 (six) hours as needed for wheezing or shortness of breath.  . ALPRAZolam (XANAX) 0.5 MG tablet Take 1 tablet (0.5 mg total) by mouth at bedtime as needed for anxiety or sleep.  Marland Kitchen atorvastatin  (LIPITOR) 20 MG tablet Take 20 mg by mouth daily.    . cholecalciferol (VITAMIN D) 1000 units tablet Take 1,000 Units by mouth daily.  Marland Kitchen dextromethorphan (DELSYM) 30 MG/5ML liquid Take 30 mg by mouth at bedtime as needed for cough.   . escitalopram (LEXAPRO) 20 MG tablet Take 20 mg by mouth daily.   . fluticasone (FLONASE) 50 MCG/ACT nasal spray Place 2 sprays into both nostrils daily.  . furosemide (LASIX) 40 MG tablet Take 40 mg by mouth. Take 60mg  daily  . INCRUSE ELLIPTA 62.5 MCG/INH AEPB Inhale 1 puff into the lungs daily.   . INCRUSE ELLIPTA 62.5 MCG/INH AEPB INHALE 1 PUFF BY MOUTH ONCE DAILY AT BEDTIME.  Marland Kitchen ipratropium-albuterol (DUONEB) 0.5-2.5 (3) MG/3ML SOLN Inhale 3 mL by nebulization every six (6) hours as needed.  Marland Kitchen  levothyroxine (SYNTHROID, LEVOTHROID) 150 MCG tablet Take 150 mcg by mouth daily before breakfast.   . metoprolol tartrate (LOPRESSOR) 25 MG tablet Take 25 mg by mouth 2 (two) times daily.  . Multiple Vitamin (MULTIVITAMIN WITH MINERALS) TABS tablet Take 1 tablet by mouth daily.  . pantoprazole (PROTONIX) 40 MG tablet Take 1 tablet (40 mg total) by mouth 2 (two) times daily.  . polyethylene glycol (MIRALAX / GLYCOLAX) packet Take 17 g by mouth daily as needed for mild constipation.  . predniSONE (DELTASONE) 10 MG tablet 4 tabs X1 day, 3 tabs X1 day, 2 tabs X1 day, 1 tab X1 day, then resume 5mg  daily.  . predniSONE (DELTASONE) 5 MG tablet Take 5 mg by mouth daily with breakfast.  . tolterodine (DETROL LA) 2 MG 24 hr capsule Take 2 mg by mouth 2 (two) times daily.   . traZODone (DESYREL) 100 MG tablet Take 100 mg by mouth at bedtime.   . VENTOLIN HFA 108 (90 Base) MCG/ACT inhaler INHALE 2 PUFFS BY MOUTH EVERY 6 HOURS FOR SHORTNESS OF BREATH OR WHEEZING.  . [DISCONTINUED] SYMBICORT 160-4.5 MCG/ACT inhaler INHALE 2 PUFFS BY MOUTH TWICE DAILY  . [DISCONTINUED] furosemide (LASIX) 40 MG tablet TAKE (1) TABLET BY MOUTH ONCE DAILY AS NEEDED FOR FLUID OR EDEMA   No  facility-administered encounter medications on file as of 02/24/2018.      Review of Systems  Constitutional:  +fatigue  No  weight loss, night sweats,  fevers, chills HEENT:   No headaches,  Difficulty swallowing,  Tooth/dental problems, or  Sore throat, No sneezing, itching, ear ache, nasal congestion, post nasal drip  CV: +LE swelling bilaterally   No chest pain,  orthopnea, PND,  anasarca, dizziness, palpitations, syncope  GI: No heartburn, indigestion, abdominal pain, nausea, vomiting, diarrhea, change in bowel habits, loss of appetite, bloody stools Resp: +dry cough  No shortness of breath with exertion or at rest.  No excess mucus, no productive cough,  No non-productive cough,  No coughing up of blood.  No change in color of mucus.  No wheezing.  No chest wall deformity Skin: no rash, lesions, no skin changes. GU: no dysuria, change in color of urine, no urgency or frequency.  No flank pain, no hematuria  MS:  No joint pain or swelling.  No decreased range of motion.  No back pain. Psych:  No change in mood or affect. No depression or anxiety.  No memory loss.   Physical Exam  BP 128/70   Pulse 88   Temp 98.7 F (37.1 C) (Oral)   Ht 5\' 5"  (1.651 m)   Wt 151 lb (68.5 kg)   SpO2 98%   BMI 25.13 kg/m    Wt Readings from Last 3 Encounters:  02/24/18 151 lb (68.5 kg)  02/24/18 151 lb (68.5 kg)  07/11/17 146 lb 12.8 oz (66.6 kg)     GEN: A/Ox3; pleasant , NAD, well nourished, chronically ill  HEENT:  [place]/AT,  EACs-clear, TMs-wnl, NOSE-clear, THROAT-clear, no lesions, no postnasal drip or exudate noted.  NECK:  Supple w/ fair ROM; no JVD; normal carotid impulses w/o bruits; no thyromegaly or nodules palpated; no lymphadenopathy.   RESP:  +poor air movement on exam, diminished breath sounds through out, course crackles  no accessory muscle use, no dullness to percussion CARD:  +3-4+ pitting edema in lower extremities bilaterally RRR, no m/r/g,  pulses intact, no cyanosis or  clubbing. GI:   Soft & nt; nml bowel sounds; no organomegaly or masses  detected.  Musco: Warm bil, no deformities or joint swelling noted.  Neuro: alert, no focal deficits noted.   Skin: Warm, no lesions or rashes    Lab Results:  CBC    Component Value Date/Time   WBC 9.8 09/23/2017 0744   RBC 3.67 (L) 09/23/2017 0744   HGB 10.3 (L) 09/23/2017 0744   HGB 11.5 (L) 06/19/2017 1018   HCT 35.5 (L) 09/23/2017 0744   HCT 36.1 06/19/2017 1018   PLT 271 09/23/2017 0744   PLT 210 06/19/2017 1018   MCV 96.7 09/23/2017 0744   MCV 97.1 06/19/2017 1018   MCH 28.1 09/23/2017 0744   MCHC 29.0 (L) 09/23/2017 0744   RDW 16.2 (H) 09/23/2017 0744   RDW 15.3 (H) 06/19/2017 1018   LYMPHSABS 0.6 (L) 06/19/2017 1018   MONOABS 0.6 06/19/2017 1018   EOSABS 0.1 06/19/2017 1018   EOSABS 0.4 01/31/2014 0600   BASOSABS 0.1 06/19/2017 1018    BMET    Component Value Date/Time   NA 136 09/21/2017 0532   NA 142 06/19/2017 1018   K 3.8 09/21/2017 0532   K 4.0 06/19/2017 1018   CL 90 (L) 09/21/2017 0532   CL 101 03/08/2013 1333   CO2 40 (H) 09/21/2017 0532   CO2 36 (H) 06/19/2017 1018   GLUCOSE 105 (H) 09/21/2017 0532   GLUCOSE 174 (H) 06/19/2017 1018   GLUCOSE 101 (H) 03/08/2013 1333   BUN 18 09/21/2017 0532   BUN 14.6 06/19/2017 1018   CREATININE 0.66 09/21/2017 0532   CREATININE 0.8 06/19/2017 1018   CALCIUM 8.2 (L) 09/21/2017 0532   CALCIUM 9.3 06/19/2017 1018   GFRNONAA >60 09/21/2017 0532   GFRNONAA 46 (L) 01/27/2014 0740   GFRAA >60 09/21/2017 0532   GFRAA 53 (L) 01/27/2014 0740    BNP    Component Value Date/Time   BNP 304.4 (H) 06/24/2016 1413    ProBNP    Component Value Date/Time   PROBNP 1,120.0 (H) 01/17/2014 0315    Imaging: No results found.   Assessment & Plan:   Had extensive discussions with patient and family today in office.  I believe the patient needs to present to the [facility] emergency room for acute evaluation of fluid overload, acute on chronic  diastolic heart failure, pulmonary edema.  My concerns are this will be unable to facilitate this quick enough outpatient.  Family agrees.  Patient reluctantly agrees.  I discussed the case with Dr. Lamonte Sakai as well  Patient family will present to the [facility], ED, we have notified the triage line of they are coming.  They will need a cardiology consult as well as a pulmonary consult.  Patient will need to be established with local cardiology team as well for regular follow-up.  Diastolic CHF, acute on chronic Recommend that you go directly to the [facility], ED >>> We will call and let them know that you are coming >>> Cardiology needs to see and consult with you there  We will see you for hospital follow-up after that appointment    Edema Recommend that you go directly to the [facility], ED >>> We will call and let them know that you are coming >>> Cardiology needs to see and consult with you there  We will see you for hospital follow-up after that appointment    Cough I believe this is cardiac driven. Present to [facility] ED ASAP.   This appt was 48 minutes long with over 50 percent of this time doing  pt assessment, care, plan of care, and discussion.    Lauraine Rinne, NP 02/24/2018

## 2018-02-24 NOTE — Assessment & Plan Note (Signed)
Recommend that you go directly to the [facility], ED >>> We will call and let them know that you are coming >>> Cardiology needs to see and consult with you there  We will see you for hospital follow-up after that appointment

## 2018-02-24 NOTE — ED Triage Notes (Signed)
Pt endorses copd exacerbation x 2 weeks, recent diagnosis of CHF with 2 heart procedures. Here for pitting edema and shob. Pt wears 3L o2 at all times. VSS. Speaking in complete sentences.

## 2018-02-24 NOTE — Assessment & Plan Note (Signed)
I believe this is cardiac driven. Present to [facility] ED ASAP.

## 2018-02-24 NOTE — ED Triage Notes (Signed)
 Pulmonology called to state patient would be coming POV for worsening CHF symptoms - they report patient has 4+ pitting edema, increased shortness of breath, cough, and minimal air movement on their exam. Patient was to be admitted at [facility] 2 weeks ago, but declined.

## 2018-02-25 NOTE — ED Notes (Signed)
Patient was seen leaving

## 2018-03-25 ENCOUNTER — Other Ambulatory Visit (HOSPITAL_COMMUNITY): Payer: Self-pay | Admitting: Interventional Radiology

## 2018-03-25 DIAGNOSIS — M4850XA Collapsed vertebra, not elsewhere classified, site unspecified, initial encounter for fracture: Secondary | ICD-10-CM

## 2018-03-25 DIAGNOSIS — M549 Dorsalgia, unspecified: Secondary | ICD-10-CM

## 2018-03-28 ENCOUNTER — Other Ambulatory Visit: Payer: Self-pay | Admitting: Adult Health

## 2018-03-31 ENCOUNTER — Encounter (HOSPITAL_COMMUNITY): Payer: Self-pay

## 2018-03-31 ENCOUNTER — Ambulatory Visit (HOSPITAL_COMMUNITY)
Admission: RE | Admit: 2018-03-31 | Discharge: 2018-03-31 | Disposition: A | Discharge status: Home / Self Care | Payer: Medicare Other | Source: Ambulatory Visit | Attending: Interventional Radiology | Admitting: Interventional Radiology

## 2018-03-31 ENCOUNTER — Ambulatory Visit (HOSPITAL_COMMUNITY): Admission: RE | Admit: 2018-03-31 | Payer: Medicare Other | Source: Ambulatory Visit

## 2018-03-31 DIAGNOSIS — M549 Dorsalgia, unspecified: Secondary | ICD-10-CM | POA: Diagnosis not present

## 2018-03-31 DIAGNOSIS — M4854XA Collapsed vertebra, not elsewhere classified, thoracic region, initial encounter for fracture: Secondary | ICD-10-CM | POA: Insufficient documentation

## 2018-03-31 DIAGNOSIS — M4850XA Collapsed vertebra, not elsewhere classified, site unspecified, initial encounter for fracture: Secondary | ICD-10-CM

## 2018-04-08 ENCOUNTER — Telehealth (HOSPITAL_COMMUNITY): Payer: Self-pay

## 2018-04-08 NOTE — Telephone Encounter (Signed)
Niece will call back to schedule. AW

## 2018-04-29 ENCOUNTER — Telehealth: Payer: Self-pay | Admitting: Pharmacy Technician

## 2018-04-29 ENCOUNTER — Telehealth: Payer: Self-pay | Admitting: Emergency Medicine

## 2018-04-29 ENCOUNTER — Encounter: Payer: Self-pay | Admitting: Emergency Medicine

## 2018-04-29 ENCOUNTER — Ambulatory Visit (INDEPENDENT_AMBULATORY_CARE_PROVIDER_SITE_OTHER): Payer: Medicare Other | Admitting: Emergency Medicine

## 2018-04-29 ENCOUNTER — Other Ambulatory Visit: Payer: Self-pay | Admitting: Emergency Medicine

## 2018-04-29 DIAGNOSIS — J9612 Chronic respiratory failure with hypercapnia: Secondary | ICD-10-CM

## 2018-04-29 DIAGNOSIS — J9611 Chronic respiratory failure with hypoxia: Secondary | ICD-10-CM | POA: Diagnosis not present

## 2018-04-29 DIAGNOSIS — J441 Chronic obstructive pulmonary disease with (acute) exacerbation: Secondary | ICD-10-CM

## 2018-04-29 DIAGNOSIS — I5033 Acute on chronic diastolic (congestive) heart failure: Secondary | ICD-10-CM | POA: Diagnosis not present

## 2018-04-29 MED ORDER — METHYLPREDNISOLONE ACETATE 80 MG/ML IJ SUSP
120.0000 mg | Freq: Once | INTRAMUSCULAR | Status: AC
  Administered 2018-04-29: 120 mg via INTRAMUSCULAR

## 2018-04-29 MED ORDER — PREDNISONE 10 MG PO TABS: ORAL_TABLET | ORAL | 0 refills | Status: DC

## 2018-04-29 NOTE — Assessment & Plan Note (Signed)
With an acute exacerbation and probably a community acquired pneumonia based on PCP chest x-ray not available to me.  She needs to be treated for this acute flare clearly.  I question whether she needs to be back on chronic prednisone, she seemed to benefit from this.  I will taper her to 10 mg, continue this dose and then decide whether to continue indefinitely depending on her improvement, response.  She will need a repeat chest x-ray and 4 to 6 weeks after antibiotic treatment.  We may decide to modify her Incruse plus Symbicort regimen at some point in the future.  Depo-Medrol shot now. Take prednisone taper as directed down to 10 mg daily and then stay on this dose until we follow-up. Continue Levaquin 750 mg once daily.  We will extend this course to 7 days (2 additional days). Continue Incruse and Symbicort as you have been taking it. Keep albuterol inhaler and nebulizer available to use up to every 4 hours if needed for shortness of breath, wheezing, chest tightness. Continue 3 to 4 L/min oxygen at all times.  Our goal saturations are 88% or above.  You may uptitrate your oxygen to achieve this goal.  You need to remember to decrease back down to 3 L/min when your activity is completed. Follow with Dr Lamonte Sakai in 1 month

## 2018-04-29 NOTE — Patient Instructions (Signed)
Depo-Medrol shot now. Take prednisone taper as directed down to 10 mg daily and then stay on this dose until we follow-up. Continue Levaquin 750 mg once daily.  We will extend this course to 7 days (2 additional days). Continue Incruse and Symbicort as you have been taking it. Keep albuterol inhaler and nebulizer available to use up to every 4 hours if needed for shortness of breath, wheezing, chest tightness. Continue cardiac medicines as directed. Continue 3 to 4 L/min oxygen at all times.  Our goal saturations are 88% or above.  You may uptitrate your oxygen to achieve this goal.  You need to remember to decrease back down to 3 L/min when your activity is completed. Follow with Dr Lamonte Sakai in 1 month

## 2018-04-29 NOTE — Progress Notes (Signed)
Subjective:    Patient ID: Garnet Sierras, female    DOB: [birth year], 84 y.o.   MRN: [id number]  HPI   84 year old woman with multifactorial combined hypoxemic and hypercapnic respiratory failure in the setting of severe COPD, hypertension with diastolic CHF, HOCM, chronic anemia due to AVM, severe aortic stenosis for which she underwent TAVR at [facility] 12/09/2017.  She has secondary pulmonary hypertension. She was on chronic prednisone 5 mg, is now off of this. She is now using BiPAP qhs.   She reports 2 weeks of progressive cough, minimally productive. Has stable dyspnea. She has documented desaturations on 3L/min. She is not using BiPAP reliably right now due to cough. She underwent a CXR and was dx w PNA, started levaquin by her PCP. She is using albuterol 2-3x a day. Maintenance incruse, symbicort.    No flowsheet data found.   Review of Systems As per HPI  Past Medical History:  Diagnosis Date  . Allergic rhinitis   . Anemia 04/23/2012   "severe @ times; think caused by colonic AVMs"  . Anginal pain ([place])    "pressure"  . Arthritis    "probably"  . Asthma   . Breast cancer ([place])   . CHF (congestive heart failure) ([place])   . COPD (chronic obstructive pulmonary disease) ([place])   . Coronary atherosclerosis of native coronary artery    Cath 1/11: RCA 50-60 ostial o/w normal. EF 70%.   . Dyspnea 04/23/2012   "all the time"  . Edema   . Emphysema   . Emphysema   . Gastric AVM   . H/O hiatal hernia   . Heart murmur   . History of blood transfusion    "many times"  . Hypertension   . Hypertr obst cardiomyop    Echo 10/10 EF 65-70% SW 1.6 PW 1.4 mild SAM no LVOT gradient at rest. Valsalva not performed Mild MR. No hyperenhance ment on MRI EF 69%.  . Hypothyroidism   . Occlusion and stenosis of carotid artery    s/p L CEA u/s 3/11. R 60-79%; L 40-59%. no change since 3/10  . Oxygen dependent 04/23/2012   "concentrated at night; liquid during day"  . Peripheral vascular disease,  unspecified ([place])   . Pneumonia   . Pulmonary hypertension ([place])    Cath 1/11 RA 8, RV 43/6/14, PA 42/17 (29) PCWP 15 Fick  5.0/2.6. PVR 2.8    . Stroke [facility]) ~ 2004   denies residual on 04/23/2012     Family History  Problem Relation Age of Onset  . Emphysema Father   . Colon cancer Mother   . Stroke Mother   . COPD Sister   . Hypertension Sister        Pulmonary  . Emphysema Sister   . Asthma Sister   . Asthma Sister   . Emphysema Brother        x2  . Colon cancer Brother      Social History   Socioeconomic History  . Marital status: Single    Spouse name: Not on file  . Number of children: Not on file  . Years of education: Not on file  . Highest education level: Not on file  Occupational History  . 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 Financial resource strain: Not on file  . Food insecurity:    Worry: Not on file    Inability:  Not on file  . Transportation needs:    Medical: Not on file    Non-medical: Not on file  Tobacco Use  . Smoking status: Former Smoker    Packs/day: 1.00    Years: 30.00    Pack years: 30.00    Types: Cigarettes    Last attempt to quit: 09/16/1997    Years since quitting: 20.6  . Smokeless tobacco: Never Used  Substance and Sexual Activity  . Alcohol use: No  . Drug use: No  . Sexual activity: Not Currently  Lifestyle  . Physical activity:    Days per week: Not on file    Minutes per session: Not on file  . Stress: Not on file  Relationships  . Social connections:    Talks on phone: Not on file    Gets together: Not on file    Attends religious service: Not on file    Active member of club or organization: Not on file    Attends meetings of clubs or organizations: Not on file    Relationship status: Not on file  . Intimate partner violence:    Fear of current or ex partner: Not on file    Emotionally abused: Not on file    Physically abused:  Not on file    Forced sexual activity: Not on file  Other Topics Concern  . Not on file  Social History Narrative   Single, no children   Lives with 2 sisters   Does not get regular exercise     Allergies  Allergen Reactions  . Latex Hives and Itching     Outpatient Medications Prior to Visit  Medication Sig Dispense Refill  . albuterol (PROVENTIL) (2.5 MG/3ML) 0.083% nebulizer solution Take 3 mLs (2.5 mg total) by nebulization every 6 (six) hours as needed for wheezing or shortness of breath. 75 mL 12  . ALPRAZolam (XANAX) 0.5 MG tablet Take 1 tablet (0.5 mg total) by mouth at bedtime as needed for anxiety or sleep. 30 tablet 0  . atorvastatin (LIPITOR) 20 MG tablet Take 20 mg by mouth daily.      . cholecalciferol (VITAMIN D) 1000 units tablet Take 1,000 Units by mouth daily.    Marland Kitchen dextromethorphan (DELSYM) 30 MG/5ML liquid Take 30 mg by mouth at bedtime as needed for cough.     . escitalopram (LEXAPRO) 20 MG tablet Take 20 mg by mouth daily.     . fluticasone (FLONASE) 50 MCG/ACT nasal spray Place 2 sprays into both nostrils daily. 16 g 5  . furosemide (LASIX) 40 MG tablet Take 40 mg by mouth. Take 60mg  daily    . INCRUSE ELLIPTA 62.5 MCG/INH AEPB INHALE 1 PUFF BY MOUTH ONCE DAILY AT BEDTIME. 30 each 2  . ipratropium-albuterol (DUONEB) 0.5-2.5 (3) MG/3ML SOLN Inhale 3 mL by nebulization every six (6) hours as needed.    Marland Kitchen levofloxacin (LEVAQUIN) 750 MG tablet Take 750 mg by mouth daily.    Marland Kitchen levothyroxine (SYNTHROID, LEVOTHROID) 150 MCG tablet Take 150 mcg by mouth daily before breakfast.     . metoprolol tartrate (LOPRESSOR) 25 MG tablet Take 25 mg by mouth 2 (two) times daily.    . Multiple Vitamin (MULTIVITAMIN WITH MINERALS) TABS tablet Take 1 tablet by mouth daily.    . pantoprazole (PROTONIX) 40 MG tablet Take 1 tablet (40 mg total) by mouth 2 (two) times daily. 60 tablet 3  . polyethylene glycol (MIRALAX / GLYCOLAX) packet Take 17 g by mouth daily as needed  for mild  constipation.    . predniSONE (DELTASONE) 5 MG tablet Take 5 mg by mouth daily with breakfast.    . SYMBICORT 160-4.5 MCG/ACT inhaler INHALE 2 PUFFS BY MOUTH TWICE DAILY 10.2 g 3  . tolterodine (DETROL LA) 2 MG 24 hr capsule Take 2 mg by mouth 2 (two) times daily.     . traZODone (DESYREL) 100 MG tablet Take 100 mg by mouth at bedtime.     . VENTOLIN HFA 108 (90 Base) MCG/ACT inhaler INHALE 2 PUFFS BY MOUTH EVERY 6 HOURS FOR SHORTNESS OF BREATH OR WHEEZING. 18 each 5  . predniSONE (DELTASONE) 10 MG tablet 4 tabs X1 day, 3 tabs X1 day, 2 tabs X1 day, 1 tab X1 day, then resume 5mg  daily. 10 tablet 0  . INCRUSE ELLIPTA 62.5 MCG/INH AEPB Inhale 1 puff into the lungs daily.      No facility-administered medications prior to visit.         Objective:   Physical Exam  Vitals:   04/29/18 1550  BP: 134/60  Pulse: (!) 107  SpO2: 96%  Weight: 150 lb (68 kg)  Height: 5' 5.5" (1.664 m)   Gen: Pleasant, well-nourished, in no distress,  normal affect  ENT: No lesions,  mouth clear,  oropharynx clear, Some nasal congestion  Neck: No JVD, no stridor  Lungs: No use of accessory muscles, very coarse, loud wheezes B  Cardiovascular: RRR, heart sounds normal, no murmur or gallops, 1+ ankle peripheral edema  Musculoskeletal: No deformities, mild toe cyanosis  Neuro: alert, non focal  Skin: Warm, no lesions or rashes      Assessment & Plan:  COPD (chronic obstructive pulmonary disease) With an acute exacerbation and probably a community acquired pneumonia based on PCP chest x-ray not available to me.  She needs to be treated for this acute flare clearly.  I question whether she needs to be back on chronic prednisone, she seemed to benefit from this.  I will taper her to 10 mg, continue this dose and then decide whether to continue indefinitely depending on her improvement, response.  She will need a repeat chest x-ray and 4 to 6 weeks after antibiotic treatment.  We may decide to modify her  Incruse plus Symbicort regimen at some point in the future.  Depo-Medrol shot now. Take prednisone taper as directed down to 10 mg daily and then stay on this dose until we follow-up. Continue Levaquin 750 mg once daily.  We will extend this course to 7 days (2 additional days). Continue Incruse and Symbicort as you have been taking it. Keep albuterol inhaler and nebulizer available to use up to every 4 hours if needed for shortness of breath, wheezing, chest tightness. Continue 3 to 4 L/min oxygen at all times.  Our goal saturations are 88% or above.  You may uptitrate your oxygen to achieve this goal.  You need to remember to decrease back down to 3 L/min when your activity is completed. Follow with Dr Lamonte Sakai in 1 month  Diastolic CHF, acute on chronic Continue cardiac medicines as directed.  Chronic respiratory failure with hypoxia and hypercapnia (HCC) Discussed titrating her oxygen with her today.  Goal saturations greater than 88%.  Baltazar Apo, MD, PhD 04/29/2018, 4:25 PM [facility] [phone] or if no answer [phone]

## 2018-04-29 NOTE — Assessment & Plan Note (Signed)
Continue cardiac medicines as directed.

## 2018-04-29 NOTE — Telephone Encounter (Signed)
Called and spoke to [name] with [facility] stated that pt's BP was 92/55 this morning, after retake BP was 97/46. [name] stated that pt was dx with PNA on Monday. [name] states that pt is experiencing increased sob with exertion and talking.  Per [name] pt's spo2 was 71% on 3L. I have spoken to [name], [name] (DPR). I have recommended that pt go to ED for eval based on symptoms and vital signs.  [name] states that pt has pending OV with RB today at 3:45 and would prefer to hold off on ED until seeing [name].  I have recommended that [name] have pt increase liter flow to 4L to increase oxygen levels. I have also spoken to pt and advised her to increase liter flow to 4L.   Pt checked oxygen level during our conversation- spO2 91% on 4L with a HR of 89. I also made pt aware of our recommendations of going to ED for eval. Pt declined as she prefers to see RB. I have offered earlier appointment with [name] today at 11:00 or 12:00. Pt also declined, as she prefers to see RB only.  I have advised pt to keep check on spo2 and if level drop below 88% on 4L to contact our office.   Will route to Rb to make aware.

## 2018-04-29 NOTE — Assessment & Plan Note (Signed)
Discussed titrating her oxygen with her today.  Goal saturations greater than 88%.

## 2018-04-30 ENCOUNTER — Telehealth: Payer: Self-pay | Admitting: Emergency Medicine

## 2018-04-30 MED ORDER — LEVOFLOXACIN 750 MG PO TABS: 750.0000 mg | ORAL_TABLET | Freq: Every day | ORAL | 0 refills | Status: DC

## 2018-04-30 MED ORDER — HYDROCODONE-HOMATROPINE 5-1.5 MG/5ML PO SYRP: 5.0000 mL | ORAL_SOLUTION | Freq: Four times a day (QID) | ORAL | 0 refills | Status: AC | PRN

## 2018-04-30 NOTE — Telephone Encounter (Addendum)
Per RB >> okay for prescription.  Rx has been printed, signed and placed up front for pick up. Melissa is aware. Nothing further was needed.

## 2018-04-30 NOTE — Telephone Encounter (Signed)
This encounter was opened in error.

## 2018-04-30 NOTE — Telephone Encounter (Signed)
Thank you :)

## 2018-04-30 NOTE — Telephone Encounter (Signed)
Spoke with [name]  She states that RB mentioned prescribing hycodan at yesterdays ov but rx was not given  She states her spouse will come in to pick up rx if he approves  She is unable to use her BIPAP due to cough and the delsym does not help  Please advise, thanks

## 2018-04-30 NOTE — Telephone Encounter (Signed)
I went ahead and sent in the additional 2 days of levaquin  LMTCB

## 2018-05-28 ENCOUNTER — Ambulatory Visit: Payer: Medicare Other | Admitting: Emergency Medicine

## 2018-06-04 ENCOUNTER — Telehealth: Payer: Self-pay

## 2018-06-04 NOTE — Telephone Encounter (Signed)
Patient's niece called stating saw PCP today, her ferritin was 45

## 2018-06-04 NOTE — Telephone Encounter (Signed)
Patient's niece calls stating that saw PCP this morning, her ferritin on 8/12 was 45 and Dr. Burr Medico tries to keep it above 100.  They would like to schedule an IV iron infusion but her PCP Dr. [name] wants to know if IV lasix infusion can be done at the same time.  [phone]

## 2018-06-04 NOTE — Telephone Encounter (Signed)
Spoke with patient's niece regarding appointments for 9/24, lab at 9:00, OV with Lacie at 9:30 and IV feraheme at 10:30.

## 2018-06-05 ENCOUNTER — Telehealth: Payer: Self-pay | Admitting: Hematology

## 2018-06-05 NOTE — Telephone Encounter (Signed)
Spoke with patient regarding appointment added to her current schedule per 9/20 sch msg

## 2018-06-05 NOTE — Telephone Encounter (Signed)
Added lab/fu for 9/24 - date/time/LB/dtr aware per 9/19 schedule message. Added with LC for 9:15 am and blocked 9:30 slot on LB schedule. Could not add for 9:30 am - not able to get around existing appointment at another facility. Infusion logged for approval from inf AD.

## 2018-06-06 IMAGING — MR MR LUMBAR SPINE WO/W CM
6 of 15 series · 17 of 48 positions shown · IV contrast (multihance)
Comparison: Portable chest radiographs 10/23/2016 and earlier.

CLINICAL DATA: 84-year-old female with thoracolumbar compression
fractures. Being considered for vertebroplasty.

EXAM:
MRI THORACIC AND LUMBAR SPINE WITHOUT AND WITH CONTRAST
TECHNIQUE: Multiplanar and multiecho pulse sequences of the thoracic and lumbar
spine were obtained without and with intravenous contrast.
CONTRAST:  14mL MULTIHANCE GADOBENATE DIMEGLUMINE 529 MG/ML IV SOLN

[Series 5: T1 · sagittal · 3.0mm · 0.66mm/px · 1 of 14 slices shown (1 of 2)]
[im 1/14]
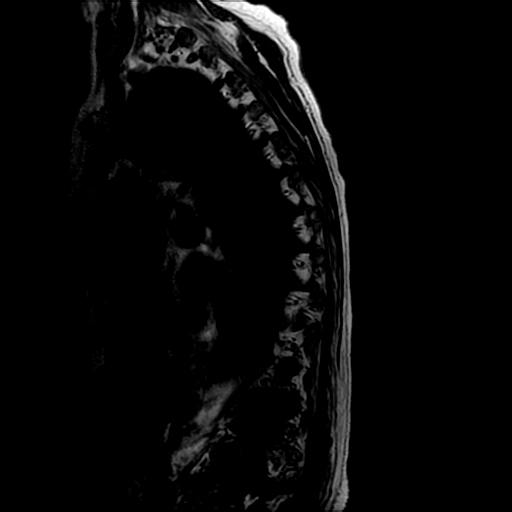

[Series 7: T2 · sagittal · 3.0mm · 0.66mm/px · 2 of 15 slices shown (1 of 3)]
[im 1/15]
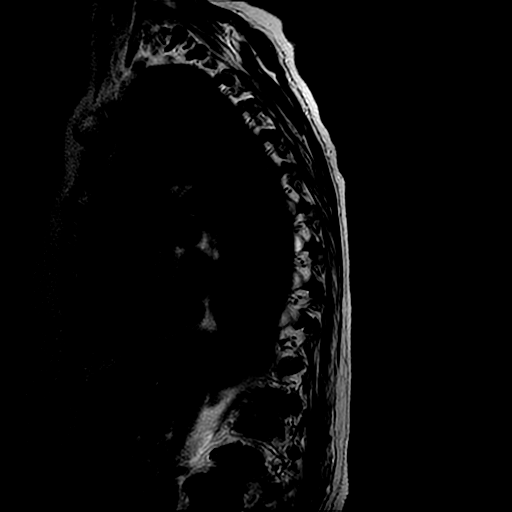
[im 15/15]
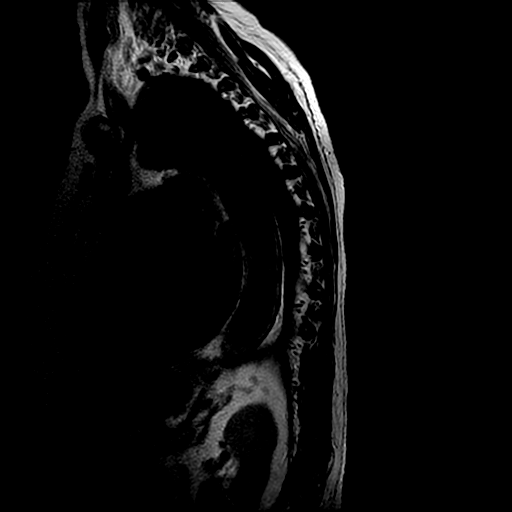

[Series 8: T2 · axial · 4.0mm · 0.39mm/px · z∈[-274,-65]mm · 6 of 44 slices shown (2 of 3)]
[im 1/44]
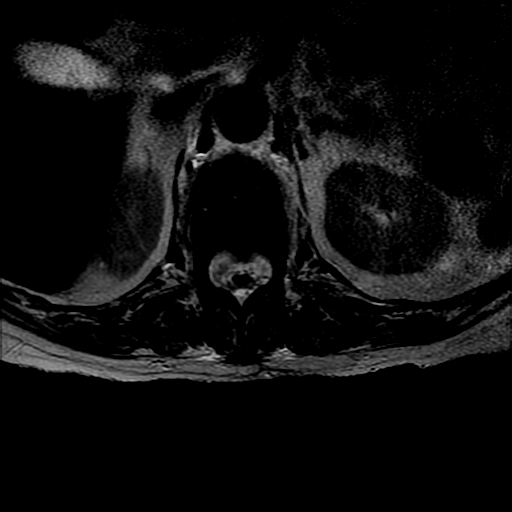
[im 9/44]
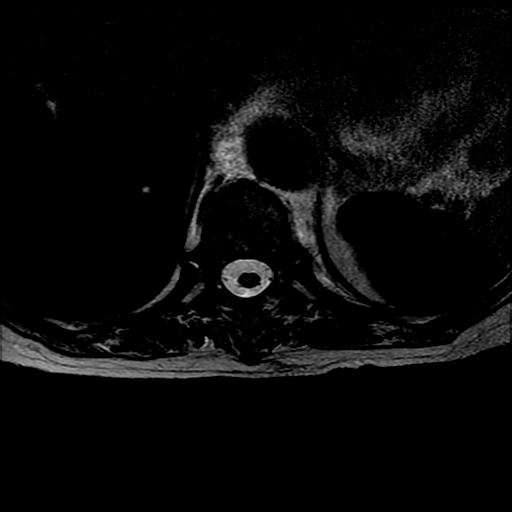
[im 18/44]
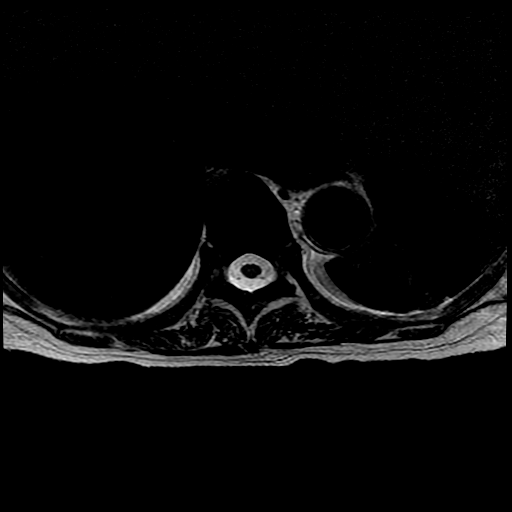
[im 26/44]
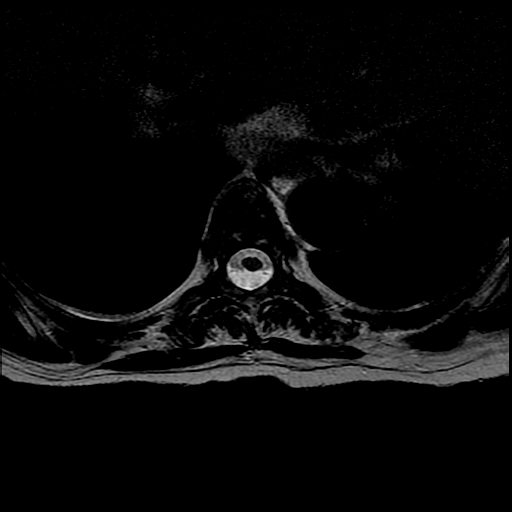
[im 35/44]
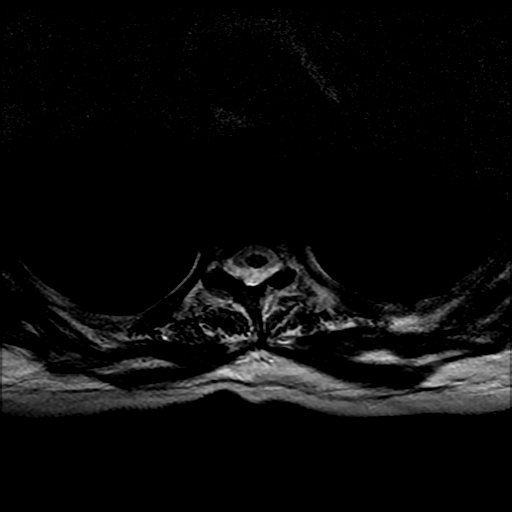
[im 44/44]
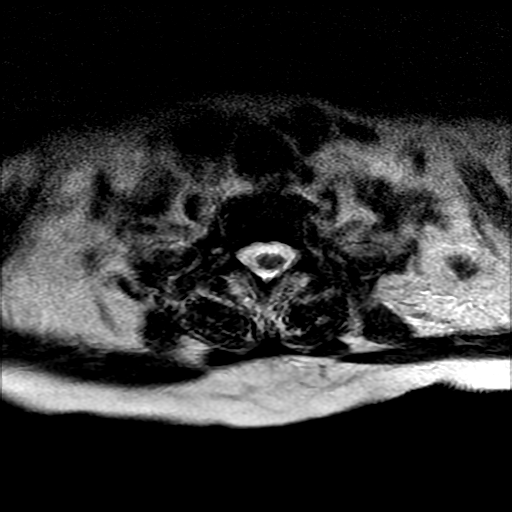

[Series 9: T1 · axial · non-contrast · 4.0mm · 0.39mm/px · 1 of 44 slices shown (2 of 2)]
[im 1/44]
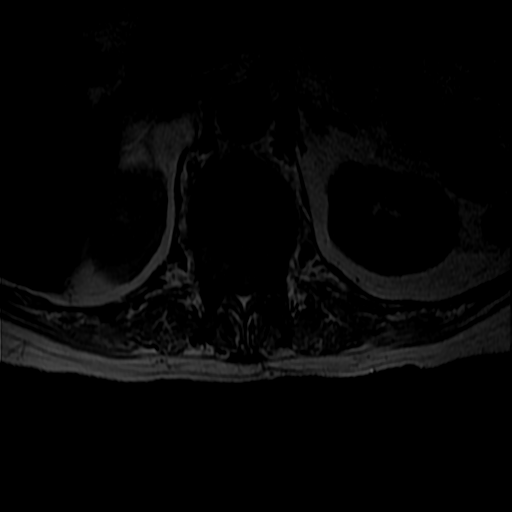

[Series 14: T2 · axial · 4.0mm · 0.39mm/px · z∈[-440,-224]mm · 5 of 38 slices shown (3 of 3)]
[im 1/38]
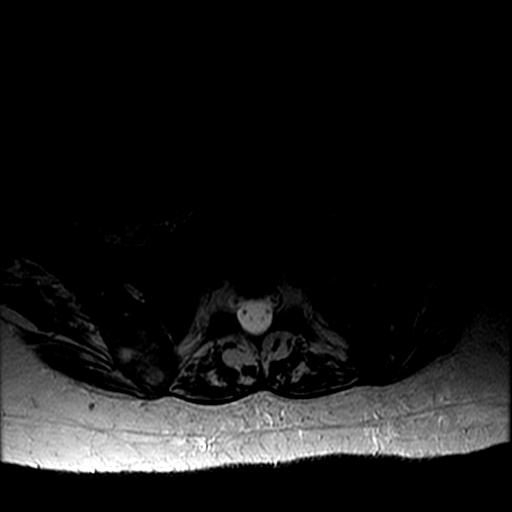
[im 10/38]
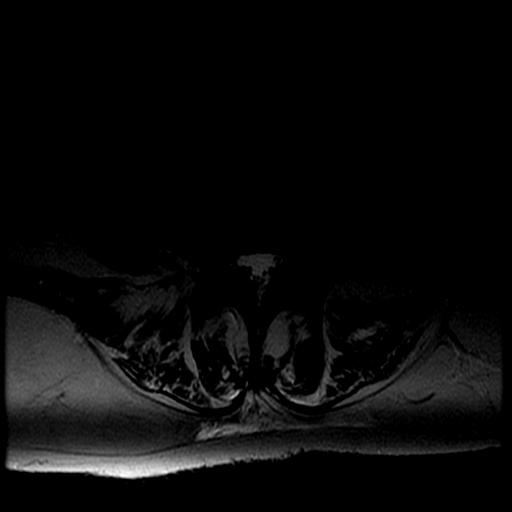
[im 19/38]
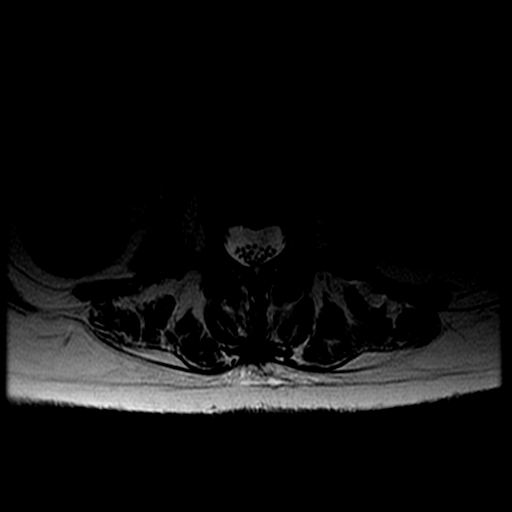
[im 28/38]
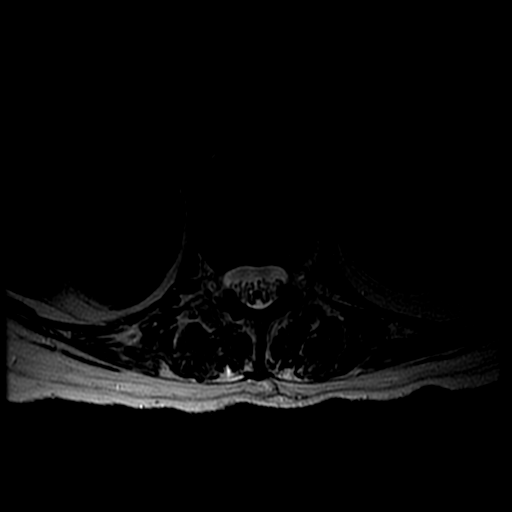
[im 38/38]
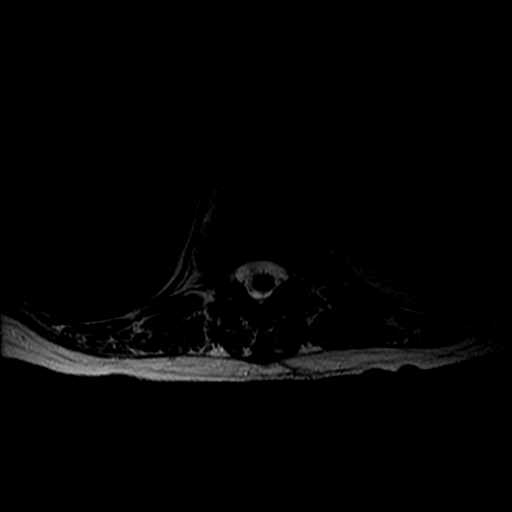

[Series 16: T2 post-contrast · sagittal · 4.0mm · 0.51mm/px · 2 of 12 slices shown]
[im 1/12]
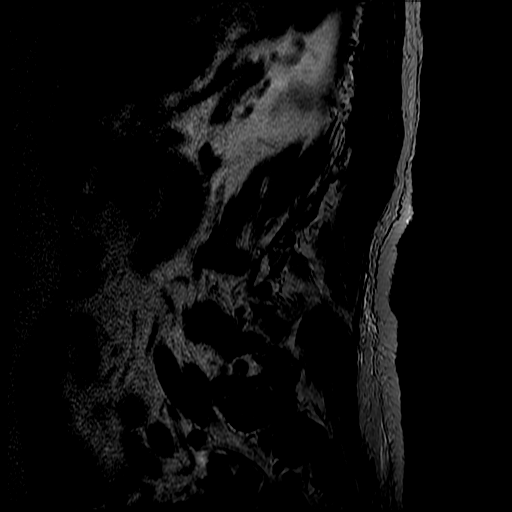
[im 12/12]
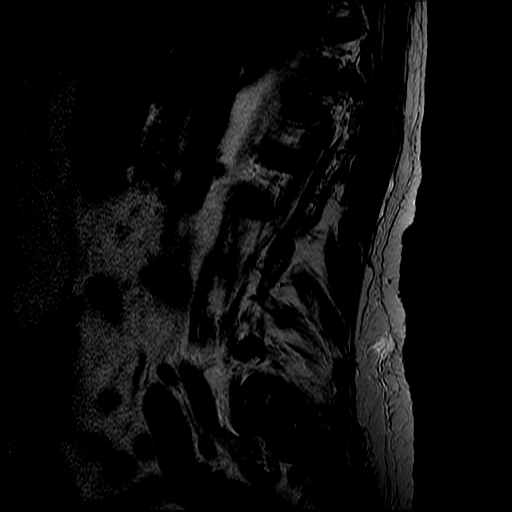

[17 of 48 positions shown; findings below may reference images not displayed]

Thoracic spine radiographs 06/20/2009 and earlier. Cervical spine
MRI 09/11/2004.
FINDINGS: MRI THORACIC SPINE FINDINGS

Limited sagittal imaging of the cervical spine: Cervical vertebral
height and alignment probably has not significantly changed since
9663.

Thoracic segmentation:  Appears to be normal.

Alignment: Straightening of lower thoracic kyphosis. Mild focal
kyphosis related to compression fractures at T7 and T12.

Vertebrae: Diffusely decreased T1 bone marrow signal throughout the
visible chest. No associated marrow edema or enhancement except at
T12. Chronic moderate to severe T7 compression fracture.

T12 compression fracture with severe loss of vertebral body height
and mild to moderate vertebral body marrow edema (series 6, image
7). More central T2 and STIR hyperintensity plus gas within the body
likely due to AVN. See series 5, image 6 and series 7, image 6.

Retropulsion of the posterosuperior endplate greater on the right.
Subsequent spinal stenosis with up to mild right hemi cord spinal
cord mass effect (series 7, image 6 and series 8, image 39). No
associated spinal cord signal abnormality.

Cord: Spinal cord signal is within normal limits at all visualized
levels. Capacious thoracic spinal canal except at T12. No abnormal
intradural enhancement.

Paraspinal and other soft tissues: Cardiomegaly. Decreased T2 signal
in the visible liver and spleen. Posterior thoracic paraspinal soft
tissues are within normal limits.

Disc levels:

Capacious thoracic spinal canal with mild or age congruent thoracic
spine degeneration. No spinal stenosis except at T11-T12.

MRI LUMBAR SPINE FINDINGS

Segmentation:  Normal.

Alignment:  Mild exaggeration of upper lumbar lordosis.

Vertebrae: Diffusely decreased T1 marrow signal in the visible
lumbosacral spine and pelvis, but less pronounced than in the
thoracic spine. Mild enhancement of the L5 inferior endplate appears
to be related to a small Schmorl node (series 17, image 6). No
suspicious osseous lesion. Chronic L1 and L2 compression fractures
with no marrow edema. Mild to moderate retropulsion of bone at both
levels. Visible sacrum and SI joints intact.

Conus medullaris: Extends to the L1 level and appears normal. No
abnormal intradural enhancement.

Paraspinal and other soft tissues: Decreased T2 signal in the
visible liver and spleen. Severe diverticulosis of colon in the
pelvis with no active inflammation. Mild lumbar spine subcutaneous
edema.

Disc levels:

Capacious lumbar spinal canal. No lumbar spinal stenosis except for
that related to bony retropulsion at the L1 and L2 levels, mild.
IMPRESSION: 1. Acute to subacute severe T12 compression fracture with associated
M Hamza disease. Retropulsion of the posterosuperior T12 endplate
resulting in spinal stenosis with up to mild right hemi cord mass
effect, but no associated spinal cord signal abnormality.
2. Chronic T7, L1 and L2 compression fractures.
3. Diffusely decreased T1 marrow signal throughout the visible spine
and pelvis, favor due to hemosiderosis given associated signal
changes in the liver and spleen.
4. Severe diverticulosis of the distal colon with no active
inflammation.

## 2018-06-08 ENCOUNTER — Ambulatory Visit: Payer: Medicare Other | Admitting: Emergency Medicine

## 2018-06-09 ENCOUNTER — Inpatient Hospital Stay: Payer: Medicare Other | Attending: Nurse Practitioner | Admitting: Nurse Practitioner

## 2018-06-09 ENCOUNTER — Ambulatory Visit: Payer: Medicare Other | Admitting: Emergency Medicine

## 2018-06-09 ENCOUNTER — Telehealth: Payer: Self-pay | Admitting: Hematology

## 2018-06-09 ENCOUNTER — Inpatient Hospital Stay: Payer: Medicare Other

## 2018-06-09 ENCOUNTER — Encounter: Payer: Self-pay | Admitting: Nurse Practitioner

## 2018-06-09 VITALS — BP 125/55 | HR 70 | Temp 98.4°F | Resp 18 | Ht 65.5 in | Wt 160.4 lb

## 2018-06-09 VITALS — BP 111/47 | HR 82 | Temp 98.2°F | Resp 18

## 2018-06-09 DIAGNOSIS — M129 Arthropathy, unspecified: Secondary | ICD-10-CM | POA: Diagnosis not present

## 2018-06-09 DIAGNOSIS — R079 Chest pain, unspecified: Secondary | ICD-10-CM

## 2018-06-09 DIAGNOSIS — I10 Essential (primary) hypertension: Secondary | ICD-10-CM | POA: Diagnosis not present

## 2018-06-09 DIAGNOSIS — Z8551 Personal history of malignant neoplasm of bladder: Secondary | ICD-10-CM

## 2018-06-09 DIAGNOSIS — I509 Heart failure, unspecified: Secondary | ICD-10-CM

## 2018-06-09 DIAGNOSIS — I2510 Atherosclerotic heart disease of native coronary artery without angina pectoris: Secondary | ICD-10-CM | POA: Diagnosis not present

## 2018-06-09 DIAGNOSIS — D500 Iron deficiency anemia secondary to blood loss (chronic): Secondary | ICD-10-CM | POA: Diagnosis present

## 2018-06-09 DIAGNOSIS — Z8673 Personal history of transient ischemic attack (TIA), and cerebral infarction without residual deficits: Secondary | ICD-10-CM | POA: Diagnosis not present

## 2018-06-09 DIAGNOSIS — J439 Emphysema, unspecified: Secondary | ICD-10-CM | POA: Diagnosis not present

## 2018-06-09 DIAGNOSIS — K5521 Angiodysplasia of colon with hemorrhage: Secondary | ICD-10-CM

## 2018-06-09 DIAGNOSIS — Z853 Personal history of malignant neoplasm of breast: Secondary | ICD-10-CM

## 2018-06-09 DIAGNOSIS — Z79899 Other long term (current) drug therapy: Secondary | ICD-10-CM

## 2018-06-09 LAB — COMPREHENSIVE METABOLIC PANEL
ALBUMIN: 4.1 g/dL (ref 3.5–5.0)
ALT: 15 U/L (ref 0–44)
ANION GAP: 12 (ref 5–15)
AST: 17 U/L (ref 15–41)
Alkaline Phosphatase: 109 U/L (ref 38–126)
BUN: 17 mg/dL (ref 8–23)
CO2: 38 mmol/L — AB (ref 22–32)
Calcium: 9.6 mg/dL (ref 8.9–10.3)
Chloride: 94 mmol/L — ABNORMAL LOW (ref 98–111)
Creatinine, Ser: 0.81 mg/dL (ref 0.44–1.00)
GFR calc non Af Amer: >60 mL/min (ref >60)
GLUCOSE: 120 mg/dL — AB (ref 70–99)
POTASSIUM: 3.9 mmol/L (ref 3.5–5.1)
SODIUM: 144 mmol/L (ref 135–145)
Total Bilirubin: 0.4 mg/dL (ref 0.3–1.2)
Total Protein: 7.5 g/dL (ref 6.5–8.1)

## 2018-06-09 LAB — CBC WITH DIFFERENTIAL/PLATELET
Basophils Absolute: 0 10*3/uL (ref 0.0–0.1)
Basophils Relative: 0 %
EOS PCT: 1 %
Eosinophils Absolute: 0.1 10*3/uL (ref 0.0–0.5)
HEMATOCRIT: 45.6 % (ref 34.8–46.6)
Hemoglobin: 13.6 g/dL (ref 11.6–15.9)
LYMPHS ABS: 1.2 10*3/uL (ref 0.9–3.3)
LYMPHS PCT: 9 %
MCH: 31.6 pg (ref 25.1–34.0)
MCHC: 29.8 g/dL — ABNORMAL LOW (ref 31.5–36.0)
MCV: 105.8 fL — AB (ref 79.5–101.0)
MONO ABS: 0.8 10*3/uL (ref 0.1–0.9)
Monocytes Relative: 6 %
Neutro Abs: 10.8 10*3/uL — ABNORMAL HIGH (ref 1.5–6.5)
Neutrophils Relative %: 84 %
PLATELETS: 242 10*3/uL (ref 145–400)
RBC: 4.31 MIL/uL (ref 3.70–5.45)
RDW: 14 % (ref 11.2–14.5)
WBC: 12.9 10*3/uL — ABNORMAL HIGH (ref 3.9–10.3)

## 2018-06-09 LAB — IRON AND TIBC
Iron: 140 ug/dL (ref 41–142)
SATURATION RATIOS: 31 % (ref 21–57)
TIBC: 445 ug/dL — AB (ref 236–444)
UIBC: 305 ug/dL

## 2018-06-09 LAB — FERRITIN: FERRITIN: 41 ng/mL (ref 11–307)

## 2018-06-09 MED ORDER — SODIUM CHLORIDE 0.9 % IV SOLN
Freq: Once | INTRAVENOUS | Status: AC
  Administered 2018-06-09: 11:00:00 via INTRAVENOUS
  Filled 2018-06-09: qty 250

## 2018-06-09 MED ORDER — SODIUM CHLORIDE 0.9 % IV SOLN
510.0000 mg | INTRAVENOUS | Status: DC
  Administered 2018-06-09: 510 mg via INTRAVENOUS
  Filled 2018-06-09: qty 17

## 2018-06-09 NOTE — Progress Notes (Signed)
[place]  [phone] [phone]  Clinic Follow up Note   Patient Care Team: [name], MD as PCP - General (Family Medicine) Haroldine Laws Shaune Pascal, MD (Cardiology) 06/09/2018  DIAGNOSIS: Iron deficiency anemia due to chronic blood loss  CURRENT THERAPY: Feraheme 1020 mg IV prn, average every 2-3 months.  Currently we are checking CBC and ferritin every month (at her PCP's office). Patient received last Feraheme in 06/19/17, 02/16/18, 02/24/18.  INTERVAL HISTORY: [name] returns for follow up and possible IV Feraheme with her 2 nieces. She received IV Feraheme on 02/16/18 and 02/24/18 for Hgb 11.1 and Ferritin 23. Per patient's niece, ferritin on 8/12 was 42. I have requested that report. She is not on oral iron. She was treated with levaquin and steroids for COPD exacerbation in 04/2018 per pulmonology and continues 5 mg prednisone daily. Since last f/u in 06/2017 she has undergone cardiac ablation and aortic valve replacement. She saw PCP Dr. Mancel Bale recently who increased lasix to 80 mg BID for concern for pulmonary edema. PCP wonders if we can give IV lasix with iron today. Her niece notes she remains cancer free from her bladder cancer.   In general [name] feels well. Her activity level remains low, she rests most of the day. Leaves the house for medical appointments only. Appetite is normal. Weight fluctuates with fluid status. Denies n/v/c/d or GI bleeding. Does not know if stools are black. No recent fever or chills. Cough and dyspnea are at baseline. On 3 liters O2 which is her normal requirement. Edema in her extremities fluctuates, but none currently. She denies pain at this time, but her nieces feel she has new compression fracture based on new acute pain. However she declined work up for kyphoplasty and follows up with IR for this.    MEDICAL HISTORY:  Past Medical History:  Diagnosis Date  . Allergic rhinitis   . Anemia 04/23/2012   "severe @ times; think caused by colonic AVMs"  . Anginal pain ([place])    "pressure"  . Arthritis    "probably"  . Asthma   . Breast cancer ([place])   . CHF (congestive heart failure) ([place])   . COPD (chronic obstructive pulmonary disease) ([place])   . Coronary atherosclerosis of native coronary artery    Cath 1/11: RCA 50-60 ostial o/w normal. EF 70%.   . Dyspnea 04/23/2012   "all the time"  . Edema   . Emphysema   . Emphysema   . Gastric AVM   . H/O hiatal hernia   . Heart murmur   . History of blood transfusion    "many times"  . Hypertension   . Hypertr obst cardiomyop    Echo 10/10 EF 65-70% SW 1.6 PW 1.4 mild SAM no LVOT gradient at rest. Valsalva not performed Mild MR. No hyperenhance ment on MRI EF 69%.  . Hypothyroidism   . Occlusion and stenosis of carotid artery    s/p L CEA u/s 3/11. R 60-79%; L 40-59%. no change since 3/10  . Oxygen dependent 04/23/2012   "concentrated at night; liquid during day"  . Peripheral vascular disease, unspecified ([place])   . Pneumonia   . Pulmonary hypertension ([place])    Cath 1/11 RA 8, RV 43/6/14, PA 42/17 (29) PCWP 15 Fick  5.0/2.6. PVR 2.8    . Stroke [facility]) ~ 2004   denies residual on 04/23/2012    SURGICAL HISTORY: Past Surgical History:  Procedure Laterality Date  . APPENDECTOMY  1950  .  BREAST LUMPECTOMY  2002   right-hx of radiation  . CARDIAC CATHETERIZATION    . CAROTID ENDARTERECTOMY  2005   left  . CATARACT EXTRACTION W/ INTRAOCULAR LENS IMPLANT  02/2011   right  . COLONOSCOPY N/A 01/12/2014   Procedure: COLONOSCOPY;  Surgeon: Gatha Mayer, MD;  Location: [facility];  Service: Endoscopy;  Laterality: N/A;  MAC if available  . DILATION AND CURETTAGE OF UTERUS  1950's  . EXCISIONAL HEMORRHOIDECTOMY  1950's  . IR VERTEBROPLASTY CERV/THOR BX INC UNI/BIL INC/INJECT/IMAGING  01/21/2017  . THYROIDECTOMY  2007    I have reviewed the social history and family history with the patient and they are unchanged from previous  note.  ALLERGIES:  is allergic to latex.  MEDICATIONS:  Current Outpatient Medications  Medication Sig Dispense Refill  . albuterol (PROVENTIL) (2.5 MG/3ML) 0.083% nebulizer solution Take 3 mLs (2.5 mg total) by nebulization every 6 (six) hours as needed for wheezing or shortness of breath. 75 mL 12  . ALPRAZolam (XANAX) 0.5 MG tablet Take 1 tablet (0.5 mg total) by mouth at bedtime as needed for anxiety or sleep. 30 tablet 0  . atorvastatin (LIPITOR) 20 MG tablet Take 20 mg by mouth daily.      . cholecalciferol (VITAMIN D) 1000 units tablet Take 1,000 Units by mouth daily.    Marland Kitchen dextromethorphan (DELSYM) 30 MG/5ML liquid Take 30 mg by mouth at bedtime as needed for cough.     . escitalopram (LEXAPRO) 20 MG tablet Take 20 mg by mouth daily.     . fluticasone (FLONASE) 50 MCG/ACT nasal spray Place 2 sprays into both nostrils daily. 16 g 5  . furosemide (LASIX) 40 MG tablet Take 40 mg by mouth. Take 19m daily    . HYDROcodone-homatropine (HYCODAN) 5-1.5 MG/5ML syrup Take 5 mLs by mouth every 6 (six) hours as needed for cough. 240 mL 0  . INCRUSE ELLIPTA 62.5 MCG/INH AEPB INHALE 1 PUFF BY MOUTH ONCE DAILY AT BEDTIME. 30 each 5  . ipratropium-albuterol (DUONEB) 0.5-2.5 (3) MG/3ML SOLN Inhale 3 mL by nebulization every six (6) hours as needed.    .Marland Kitchenlevothyroxine (SYNTHROID, LEVOTHROID) 150 MCG tablet Take 150 mcg by mouth daily before breakfast.     . metoprolol tartrate (LOPRESSOR) 25 MG tablet Take 25 mg by mouth 2 (two) times daily.    . Multiple Vitamin (MULTIVITAMIN WITH MINERALS) TABS tablet Take 1 tablet by mouth daily.    . pantoprazole (PROTONIX) 40 MG tablet Take 1 tablet (40 mg total) by mouth 2 (two) times daily. 60 tablet 3  . polyethylene glycol (MIRALAX / GLYCOLAX) packet Take 17 g by mouth daily as needed for mild constipation.    . predniSONE (DELTASONE) 10 MG tablet Take 4 tablets for 3 days, 3 tablets for 3 days, 2 tablets for 3 days, then decrease to 1 tablet daily and stay  there 100 tablet 0  . predniSONE (DELTASONE) 5 MG tablet Take 5 mg by mouth daily with breakfast.    . SYMBICORT 160-4.5 MCG/ACT inhaler INHALE 2 PUFFS BY MOUTH TWICE DAILY 10.2 g 3  . tolterodine (DETROL LA) 2 MG 24 hr capsule Take 2 mg by mouth 2 (two) times daily.     . traZODone (DESYREL) 100 MG tablet Take 100 mg by mouth at bedtime.     . VENTOLIN HFA 108 (90 Base) MCG/ACT inhaler INHALE 2 PUFFS BY MOUTH EVERY 6 HOURS FOR SHORTNESS OF BREATH OR WHEEZING. 18 each 5  . levofloxacin (LEVAQUIN)  750 MG tablet Take 1 tablet (750 mg total) by mouth daily. 2 tablet 0   No current facility-administered medications for this visit.    Facility-Administered Medications Ordered in Other Visits  Medication Dose Route Frequency Provider Last Rate Last Dose  . ferumoxytol (FERAHEME) 510 mg in sodium chloride 0.9 % 100 mL IVPB  510 mg Intravenous Weekly Truitt Merle, MD   Stopped at 06/09/18 1210    PHYSICAL EXAMINATION: ECOG PERFORMANCE STATUS: 3 - Symptomatic, >50% confined to bed  Vitals:   06/09/18 0940  BP: (!) 125/55  Pulse: 70  Resp: 18  Temp: 98.4 F (36.9 C)  SpO2: 95%   Filed Weights   06/09/18 0940  Weight: 160 lb 6.4 oz (72.8 kg)    GENERAL:alert, no distress and comfortable SKIN: skin color, texture, turgor are normal, no rashes or significant lesions EYES:  sclera clear OROPHARYNX:no thrush or ulcers LYMPH:  no palpable cervical lymphadenopathy LUNGS: distant breath sounds with normal breathing effort; no wheezes, crackles, or rhonchi  HEART: regular rate & rhythm, no significant lower extremity edema ABDOMEN:abdomen soft, non-tender and normal bowel sounds Musculoskeletal:no cyanosis of digits and no clubbing  NEURO: alert & oriented x 3 with fluent speech  LABORATORY DATA:  I have reviewed the data as listed CBC Latest Ref Rng & Units 06/09/2018 02/24/2018 09/23/2017  WBC 3.9 - 10.3 K/uL 12.9(H) 11.5(H) 9.8  Hemoglobin 11.6 - 15.9 g/dL 13.6 10.8(L) 10.3(L)  Hematocrit  34.8 - 46.6 % 45.6 36.7 35.5(L)  Platelets 145 - 400 K/uL 242 292 271     CMP Latest Ref Rng & Units 06/09/2018 02/24/2018 09/21/2017  Glucose 70 - 99 mg/dL 120(H) 102(H) 105(H)  BUN 8 - 23 mg/dL _0 Creatinine 0.44 - 1.00 mg/dL 0.81 0.89 0.66  Sodium 135 - 145 mmol/L 144 142 136  Potassium 3.5 - 5.1 mmol/L 3.9 4.6 3.8  Chloride 98 - 111 mmol/L 94(L) 95(L) 90(L)  CO2 22 - 32 mmol/L 38(H) 36(H) 40(H)  Calcium 8.9 - 10.3 mg/dL 9.6 8.8(L) 8.2(L)  Total Protein 6.5 - 8.1 g/dL 7.5 - -  Total Bilirubin 0.3 - 1.2 mg/dL 0.4 - -  Alkaline Phos 38 - 126 U/L 109 - -  AST 15 - 41 U/L 17 - -  ALT 0 - 44 U/L 15 - -      RADIOGRAPHIC STUDIES: I have personally reviewed the radiological images as listed and agreed with the findings in the report. No results found.   ASSESSMENT & PLAN: [name] 84 y.o. female with a history of Iron deficiency anemia due to chronic blood loss   PLAN:  1. Anemia secondary to iron deficiency and GI bleeding.  -She had multiple GI workup in the past, which showed AVM. She does not follow up with GI anymore -IV Feraheme if ferritin less than 100 -today's ferritin is 41, normal Hgb and serum iron -will proceed with IV Feraheme x1 -family notes PCP mentioned possible IV lasix dose with feraheme today; given her age, borderline low BP 125/55, and unremarkable pulm exam, I do not recommend IV lasix today; she continues 80 mg po BID per PCP -She prefers to get labs checked per PCP closer to home, and call us if infusion is needed -she will return for IV Feraheme PRN, and f/u in 1 year   2. COPD  -on oxygen, f/u with pulmonary -recently completed levaquin and currently on steroids 5 mg prednisone for COPD exacerbation in 04/2018  -she appears improved  today; prednisone likely contributing to mild leukocytosis   3. Fatigue  -likely related to iron deficiency and other mo-morbidities   4. HTN -F/u with Dr. Mancel Bale  5. Back pain from compression  fracture  -F/u with orthopedics and IR   6. Bladder cancer -S/p surgical resection and radiation at [facility], not a candidate for chemotherapy -F/u at [facility]; last cystoscopy was negative for recurrence  -Per family she remains cancer free    PLAN  -CBC reviewed, Hgb 13.5 -Ferritin 41, will proceed with IV Feraheme x1 today -Continue lab, f/u with PCP; patient to call when Shirlean Kelly is needed to keep ferritin at 100 -Lab, f/u with Dr. Burr Medico in 1 year   All questions were answered. The patient knows to call the clinic with any problems, questions or concerns. No barriers to learning was detected. I spent 20 minutes counseling the patient face to face. The total time spent in the appointment was 25 minutes and more than 50% was on counseling and review of test results     Alla Feeling, NP 06/09/18

## 2018-06-09 NOTE — Patient Instructions (Signed)

## 2018-06-19 ENCOUNTER — Ambulatory Visit: Payer: Medicare Other | Admitting: Emergency Medicine

## 2018-07-22 ENCOUNTER — Telehealth: Payer: Self-pay

## 2018-07-22 ENCOUNTER — Telehealth: Payer: Self-pay | Admitting: Hematology

## 2018-07-22 ENCOUNTER — Other Ambulatory Visit: Payer: Self-pay | Admitting: Hematology

## 2018-07-22 NOTE — Telephone Encounter (Signed)
Received call from [facility], stating that patient's Ferritin is 65, requesting IV iron infusion.  [phone]

## 2018-07-22 NOTE — Telephone Encounter (Signed)
Scheduled appt per 1/16 sch message - pt is aware of appt date and time   

## 2018-07-22 NOTE — Telephone Encounter (Signed)
I will send a scheduling message, thanks   Truitt Merle MD

## 2018-07-27 ENCOUNTER — Telehealth: Payer: Self-pay

## 2018-07-27 NOTE — Telephone Encounter (Signed)
Spoke with niece concerning the patient upcoming appointment. Per 11/11 vm return call her appointment was changed due to transportation. Niece called to r/s

## 2018-07-29 ENCOUNTER — Ambulatory Visit: Payer: Medicare Other

## 2018-08-03 ENCOUNTER — Inpatient Hospital Stay: Payer: Medicare Other | Attending: Nurse Practitioner

## 2018-08-03 VITALS — BP 153/64 | HR 108 | Temp 98.2°F | Resp 20

## 2018-08-03 DIAGNOSIS — Z79899 Other long term (current) drug therapy: Secondary | ICD-10-CM | POA: Insufficient documentation

## 2018-08-03 DIAGNOSIS — K5521 Angiodysplasia of colon with hemorrhage: Secondary | ICD-10-CM

## 2018-08-03 DIAGNOSIS — D500 Iron deficiency anemia secondary to blood loss (chronic): Secondary | ICD-10-CM | POA: Insufficient documentation

## 2018-08-03 MED ORDER — SODIUM CHLORIDE 0.9 % IV SOLN
510.0000 mg | INTRAVENOUS | Status: DC
  Administered 2018-08-03: 510 mg via INTRAVENOUS
  Filled 2018-08-03: qty 17

## 2018-08-03 NOTE — Patient Instructions (Signed)

## 2018-08-05 ENCOUNTER — Ambulatory Visit: Payer: Medicare Other

## 2018-08-10 ENCOUNTER — Inpatient Hospital Stay: Payer: Medicare Other

## 2018-08-10 VITALS — BP 143/61 | HR 99 | Temp 98.0°F | Resp 20

## 2018-08-10 DIAGNOSIS — K5521 Angiodysplasia of colon with hemorrhage: Secondary | ICD-10-CM

## 2018-08-10 DIAGNOSIS — D500 Iron deficiency anemia secondary to blood loss (chronic): Secondary | ICD-10-CM | POA: Diagnosis not present

## 2018-08-10 MED ORDER — SODIUM CHLORIDE 0.9 % IV SOLN
Freq: Once | INTRAVENOUS | Status: AC
  Administered 2018-08-10: 15:00:00 via INTRAVENOUS
  Filled 2018-08-10: qty 250

## 2018-08-10 MED ORDER — SODIUM CHLORIDE 0.9 % IV SOLN
510.0000 mg | INTRAVENOUS | Status: DC
  Administered 2018-08-10: 510 mg via INTRAVENOUS
  Filled 2018-08-10: qty 17

## 2018-08-10 NOTE — Progress Notes (Signed)
Pt observed for 30 minutes post feraheme infusion. VSS, pt with no complaints. AVS with information on feraheme given to pt.

## 2018-08-10 NOTE — Patient Instructions (Signed)

## 2018-08-12 ENCOUNTER — Ambulatory Visit: Payer: Medicare Other

## 2018-08-24 ENCOUNTER — Other Ambulatory Visit: Payer: Self-pay | Admitting: Emergency Medicine

## 2018-09-22 ENCOUNTER — Other Ambulatory Visit: Payer: Self-pay | Admitting: Emergency Medicine

## 2018-09-22 DIAGNOSIS — J441 Chronic obstructive pulmonary disease with (acute) exacerbation: Secondary | ICD-10-CM

## 2018-11-12 ENCOUNTER — Other Ambulatory Visit: Payer: Medicare Other | Admitting: Student

## 2018-11-12 DIAGNOSIS — Z515 Encounter for palliative care: Secondary | ICD-10-CM

## 2018-11-12 NOTE — Progress Notes (Signed)
[facility] Consult Note Telephone: [phone]  Fax: [phone]  PATIENT NAME: Monnica Saltsman DOB: 1933-02-20 MRN: [id number]  PRIMARY CARE PROVIDER:   [name], MD  REFERRING PROVIDER:  [name], MD [place], [place] [address]  RESPONSIBLE PARTY: Niece, Su Hoff  ASSESSMENT:  Ms. Chesterfield is ambulatory with rollator walker upon arrival. She is visibly short of breath and has to rest before speaking. Nieces [name] and Marlowe Kays are present. Private caregiver Mariann Laster is also present. Discussed role of Palliative Medicine. We discussed goals of care; she would like to remain comfortable and limit hospitalizations. We discussed Trilogy usage and patient not being admitted to Hospice services recently. We discussed her disease processes. We discussed services provided by Hospice. We discussed code status; she is a DNR/DNI. Copy of MOST form left for patient and family to review. Palliative Medicine to continue to follow in the home.     RECOMMENDATIONS and PLAN:  1. Code status: DNR, in the home. 2. Medical goals of therapy: For patient to be comfortable and limit hospitalizations. Ms. Klinck is to continue wearing oxygen via nasal canula at 3 to 4 lpm; continue trilogy at night.  3. Symptom management: dyspnea-continue Incruse ellipta, symbicort, albuterol inhalers as directed. Continue oxygen as directed, trilogy at night as directed. Weight gain/edema-continue torsemide 20mg  BID, metolazone 2.5mg  as directed per Dr. Mancel Bale. She is encouraged to elevate her legs while in recliner. 4. Discharge Planning: Ms. Woodrow will continue to reside at home with private caregivers.  5. Emotional/spiritual support: discussed with Ms. Florene Glen, nieces [place] and [place], caregiver Mariann Laster. They are encouraged to call with questions.  Dr. Mancel Bale office notified with request for home health SN for in home lab draws; awaiting response.     I spent 75 minutes providing this consultation,  from 12:00pm to 1:15pm. More than 50% of the time in this consultation was spent coordinating communication.   HISTORY OF PRESENT ILLNESS:  Tanicia Wolaver is a 85 y.o. female with multiple medical problems including COPD, cardiomyopathy, left ventricular hypertrophy, chronic diastolic heart failure, aortic stenosis status post TAVR, anemia, arthritis, arteriovenous malformation of colon, hemorrhoids, hypercholesterolemia, hypertension, thyroid disease, TIA. History of  breast cancer, bladder cancer. Ms. Deguzman was recently hospitalized 2/7-2/07/2019 with copd exacerbation, chronic respiratory failure with hypoxia and hypercapnia. Palliative Care was asked to help address goals of care. Ms. Marlette reports feeling better since hospitalization. Ms. Mink had been evaluated by hospice upon returning home from hospital, but was not admitted due her current usage of Triology machine, non-invasive ventilation. Per her niece [name], she has been using Trilogy machine for 14-15 months and it was recommended for her to wear 24/7, but she is now only wearing it at night due to "feeling smothered" when she wears it during the day. She had been wearing it for 16 hours a day prior to hospitalization. She is currently wearing oxygen via nasal canula at 3 liters per minute; sometimes increases to 4 liters per minute. She denies pain. She reports dyspnea with any exertion, with talking and sometimes at rest. She denies cough, family and caregiver Mariann Laster report occasional cough. She reports a good appetite. She is weighed daily; she gained 8 pounds in one week; she is currently receiving metolazone 2.5mg  daily before the torsemide; she has lost 4 pounds since metolazone has been given. She reports edema to lower extremities that worsens throughout the day. Melissa also reports patient "carrying fluid" around her  abdomen. Ms. Mesa has 24/7 caregivers. She receives assistance  with all adl's. She uses rollator walker for ambulation. She is now only going out of house for medical appointments and this has become much more difficulty for patient. Wheel chair is used for locomotion when going out. Melissa states that patient has been going out to have ferritin and hemoglobin drawn monthly; she last had a transfusion 08/2018. Melissa would like to know if labs can be drawn in the home. Ms. Cogan is a DNR/DNI.   CODE STATUS: DNR/DNI  PPS: 40% HOSPICE ELIGIBILITY/DIAGNOSIS: TBD  PAST MEDICAL HISTORY:  Past Medical History:  Diagnosis Date  . Allergic rhinitis   . Anemia 04/23/2012   "severe @ times; think caused by colonic AVMs"  . Anginal pain ([place])    "pressure"  . Arthritis    "probably"  . Asthma   . Breast cancer ([place])   . CHF (congestive heart failure) ([place])   . COPD (chronic obstructive pulmonary disease) ([place])   . Coronary atherosclerosis of native coronary artery    Cath 1/11: RCA 50-60 ostial o/w normal. EF 70%.   . Dyspnea 04/23/2012   "all the time"  . Edema   . Emphysema   . Emphysema   . Gastric AVM   . H/O hiatal hernia   . Heart murmur   . History of blood transfusion    "many times"  . Hypertension   . Hypertr obst cardiomyop    Echo 10/10 EF 65-70% SW 1.6 PW 1.4 mild SAM no LVOT gradient at rest. Valsalva not performed Mild MR. No hyperenhance ment on MRI EF 69%.  . Hypothyroidism   . Occlusion and stenosis of carotid artery    s/p L CEA u/s 3/11. R 60-79%; L 40-59%. no change since 3/10  . Oxygen dependent 04/23/2012   "concentrated at night; liquid during day"  . Peripheral vascular disease, unspecified ([place])   . Pneumonia   . Pulmonary hypertension ([place])    Cath 1/11 RA 8, RV 43/6/14, PA 42/17 (29) PCWP 15 Fick  5.0/2.6. PVR 2.8    . Stroke [facility]) ~ 2004   denies residual on 04/23/2012    SOCIAL HX:  Social History   Tobacco Use  . Smoking status: Former Smoker    Packs/day: 1.00    Years: 30.00    Pack years: 30.00    Types:  Cigarettes    Last attempt to quit: 09/16/1997    Years since quitting: 21.1  . Smokeless tobacco: Never Used  Substance Use Topics  . Alcohol use: No    ALLERGIES:  Allergies  Allergen Reactions  . Latex Hives and Itching     PERTINENT MEDICATIONS:  Outpatient Encounter Medications as of 11/12/2018  Medication Sig  . albuterol (PROVENTIL) (2.5 MG/3ML) 0.083% nebulizer solution USE ONE VIAL IN NEBULIZER THREE TIMES DAILY AS NEEDED.  Marland Kitchen aspirin EC 81 MG tablet Take 81 mg by mouth daily.  . cholecalciferol (VITAMIN D) 1000 units tablet Take 1,000 Units by mouth daily.  Marland Kitchen escitalopram (LEXAPRO) 20 MG tablet Take 20 mg by mouth daily.   . ferrous sulfate 325 (65 FE) MG tablet Take 325 mg by mouth daily with breakfast.  . INCRUSE ELLIPTA 62.5 MCG/INH AEPB INHALE 1 PUFF BY MOUTH ONCE DAILY AT BEDTIME.  Marland Kitchen levothyroxine (SYNTHROID, LEVOTHROID) 150 MCG tablet Take 150 mcg by mouth daily before breakfast.   . loratadine (CLARITIN) 10 MG tablet Take 10 mg by mouth daily.  . Melatonin 3 MG TABS  Take 3 mg by mouth.  . metoprolol tartrate (LOPRESSOR) 25 MG tablet Take 12.5 mg by mouth 2 (two) times daily.   . Multiple Vitamin (MULTIVITAMIN WITH MINERALS) TABS tablet Take 1 tablet by mouth daily.  . pantoprazole (PROTONIX) 40 MG tablet Take 1 tablet (40 mg total) by mouth 2 (two) times daily.  . polyethylene glycol (MIRALAX / GLYCOLAX) packet Take 17 g by mouth daily as needed for mild constipation.  . potassium chloride SA (K-DUR,KLOR-CON) 20 MEQ tablet Take 20 mEq by mouth 2 (two) times daily.  . predniSONE (DELTASONE) 5 MG tablet Take 5 mg by mouth daily with breakfast.  . senna (SENOKOT) 8.6 MG TABS tablet Take 1 tablet by mouth at bedtime.  . SYMBICORT 160-4.5 MCG/ACT inhaler INHALE 2 PUFFS BY MOUTH TWICE DAILY  . tolterodine (DETROL LA) 2 MG 24 hr capsule Take 2 mg by mouth 2 (two) times daily.   Marland Kitchen torsemide (DEMADEX) 20 MG tablet Take 20 mg by mouth 2 (two) times daily.  . VENTOLIN HFA 108 (90  Base) MCG/ACT inhaler INHALE 2 PUFFS BY MOUTH EVERY 6 HOURS FOR SHORTNESS OF BREATH OR WHEEZING.  . ALPRAZolam (XANAX) 0.5 MG tablet Take 1 tablet (0.5 mg total) by mouth at bedtime as needed for anxiety or sleep. (Patient not taking: Reported on 11/12/2018)  . dextromethorphan (DELSYM) 30 MG/5ML liquid Take 30 mg by mouth at bedtime as needed for cough.   . fluticasone (FLONASE) 50 MCG/ACT nasal spray Place 2 sprays into both nostrils daily.  Marland Kitchen HYDROcodone-homatropine (HYCODAN) 5-1.5 MG/5ML syrup Take 5 mLs by mouth every 6 (six) hours as needed for cough.  Marland Kitchen ipratropium-albuterol (DUONEB) 0.5-2.5 (3) MG/3ML SOLN Inhale 3 mL by nebulization every six (6) hours as needed.  . traZODone (DESYREL) 100 MG tablet Take 100 mg by mouth at bedtime.   . [DISCONTINUED] atorvastatin (LIPITOR) 20 MG tablet Take 20 mg by mouth daily.    . [DISCONTINUED] furosemide (LASIX) 40 MG tablet Take 40 mg by mouth. Take 60mg  daily  . [DISCONTINUED] levofloxacin (LEVAQUIN) 750 MG tablet Take 1 tablet (750 mg total) by mouth daily.  . [DISCONTINUED] predniSONE (DELTASONE) 10 MG tablet Take 4 tablets for 3 days, 3 tablets for 3 days, 2 tablets for 3 days, then decrease to 1 tablet daily and stay there   No facility-administered encounter medications on file as of 11/12/2018.     PHYSICAL EXAM:   General: NAD, frail appearing Cardiovascular: regular rate and rhythm Pulmonary: clear ant fields Abdomen: soft, nontender, + bowel sounds GU: no suprapubic tenderness Extremities: 2+ pitting pedal edema, no joint deformities Skin: no rashes Neurological: Weakness but otherwise nonfocal  Ezekiel Slocumb, NP

## 2018-11-13 ENCOUNTER — Telehealth: Payer: Self-pay | Admitting: Student

## 2018-11-13 NOTE — Telephone Encounter (Signed)
Palliative NP called to speak with niece Melissa regarding conversation on hospice criteria, trilogy usage after speaking with Hospice Medical director. She is made aware of recommendations of patient doing 1-2 night trial without trilogy machine to see how she feels and just use oxygen continuously to see if she is actually benefiting from machine since she is only using at night. Low dose opioid such as morphine 5mg  BID is also recommended. Melissa would like to speak with Dr. Mancel Bale to discuss trial, prior to implementing. She expresses appreciation for follow up. She denies any other needs/questions at this time. She is encouraged to call as needs arise.LR

## 2018-11-19 ENCOUNTER — Telehealth: Payer: Self-pay | Admitting: Nurse Practitioner

## 2018-11-19 NOTE — Telephone Encounter (Signed)
I called and scheduled f/u in--home PC visit for March 31 Tuesday at 2 pm

## 2018-12-10 ENCOUNTER — Other Ambulatory Visit: Payer: Self-pay | Admitting: Emergency Medicine

## 2018-12-15 ENCOUNTER — Telehealth: Payer: Self-pay | Admitting: Nurse Practitioner

## 2018-12-15 ENCOUNTER — Telehealth: Payer: Medicare Other | Admitting: Nurse Practitioner

## 2018-12-15 DIAGNOSIS — Z515 Encounter for palliative care: Secondary | ICD-10-CM

## 2018-12-15 NOTE — Telephone Encounter (Signed)
A user error has taken place: encounter opened in error, closed for administrative reasons.

## 2018-12-15 NOTE — Telephone Encounter (Signed)
Braxton Weisbecker [birth year]  She had a positive screening per caregiver with +cough/+congestion/+aches. I attempted to contact niece, [name], the first # unable to leave a message, second # was disconnected.  [place] No email for ZOOM; Actual Visit [phone] Su Hoff niece Chauncey [place] [phone] disconnected [phone] unable to leave a message  Will continue to try to contact Melissa to reschedule appointment

## 2018-12-15 NOTE — Telephone Encounter (Signed)
Call to screen [name] prior to in-home palliative care visit. She does per Mariann Laster caregiver have a cough, chronically low worsening congestion, back aching with recent visit to the emergency department and worsening shortness of breath. She is very limited with her Mobility. With symptoms and risk, screening positive in the setting of covid-19 discuss with Mariann Laster to reschedule. I attempted to contact [place] of attorney niece throughout the morning in order to reschedule visit for teleconference, unable to reach her and voicemail did not allow me to leave a message.

## 2018-12-15 NOTE — Progress Notes (Signed)
[employer] Consult Note Telephone: [phone]  Fax: [phone]  PATIENT NAME: [name] DOB: November 08, 1932 MRN: [id number]  PRIMARY CARE PROVIDER:   [name], MD  REFERRING PROVIDER:  [name], MD [place], [place] [address]  RESPONSIBLE PARTY:  niece Su Hoff   Due to the COVID-19 crisis, this visit was done via telemedicine from my office and it was initiated and consent by this patient and or family. Telemedicine appointment set, attempted but Internet connection would not support video zoom visit resulting in telephone assessment scheduled.   RECOMMENDATIONS and PLAN:   1.Palliative care encounter Z51.5; Palliative medicine team will continue to support patient, patient's family, and medical team. Visit consisted of counseling and education dealing with the complex and emotionally intense issues of symptom management and palliative care in the setting of serious and potentially life-threatening illness  ASSESSMENT:     When I was able to get in contact with [name] discussed the last time she was independent at home, past medical history and progression of cancer. We talked about [name]'s overall concern about decline functionally and cognitively as she's been more confused. [name] endorses she has attempted to place a call to the oncologist as well as primary provider for further discussion of treatment options for cancer. We talked about what [name] has been through and her current clinical condition from what [name] describes. [name] endorses that she would like to speak with the oncologist for further discussion of possible more radiation. We talked about medical goals of care and [name] endorses she wishes for [name] to be comfortable. [name] endorses that she is currently not at the house and her cell phone does not have good signal and Telehealth may not be possible. [name] and I did  talk about continuing to attempt to contact oncology with primary care since they are familiar with [name] with her clinical condition. At this point in time will schedule palliative care televisit after [name] speaks with oncology and primary care if the decision is made not to do hospice which is what the recommendation is.  [name] and scheduled a teleconference palliative care visit for 2 p.m. today. I called [name] to do the teleconference and asked if she was able to get in contact with oncology or primary care provider. She verbalize that she was not able to. [name] and I attempted to zoom teleconference although it kept  dropping the  video and disconnecting from internet. We continued on with a telephone discussion. I discuss with [name] that I was able to talk with Dr Burr Medico oncologist at [facility]. Dr. Burr Medico that she was treating her for her anemia and cancer was being treated by oncologist at [facility] Dr Burr Medico did endorse that productive mentation at [facility] it was recommended that she seek Hospice Services once she was discharged home from this past hospitalization. [name] and Sydell Axon says she felt like that recommendation which was shared with her was from her COPD not her cancer. We talked at length about her cancer, COPD with exacerbation, severe requiring Trilogy at night which [name] does not like to wear. We talked about chronic disease progression in the setting of natural aging. We talked about multiple diagnosis which would meet eligibility for Hospice Services if that was what their wishes were. We talked about what Hospice Services would provide. I talked to [name] and asked if she understood  the cancer and COPD diagnosis. She endorses that she did understand. She shared that she will do what she's asked to do. I shared with her oncology recommended Hospice Services with discharged home. [name] reacted to that as acceptance of hospice as her  sister was on hospice at home and passed.  [name] endorses that she is familiar with hospice services and if that's what is recommended that's what she will do. If there was treatment options that were recommended she may consider it. We talked about role of palliative care and plan of care. She is a DNR. [name] and I talked at length about quality of life versus quantity of days. We talked about increasing Victoria Puryear's ability to comprehend cognitively and understand what truly is happening and decision-making is poor.  [name] is concerned that she doesn't understand the full realm of her health and clinical decline. [name] endorses that she feels like all the decision-making falls on her as she is her health care power of attorney. She doesn't want [name] to feel like she is losing hope for her. We talked about ope and healing and that what that looks like in the setting of comfort care. We talked about where [name] would like for [name] to spend her in days either to be in the hospital getting aggressive treatment or at home for saving Hospice Services what that would look like. [name] endorses that she would like for [name] to be at home where she likes to be, comfortable and die with family and hospice around her. We talked about symptom management.  Therapeutic listening and emotional support provided. [name] endorses that she does want to continue to speak with oncology for confirmation that there is nothing else to be done as far as treatment options and this is the progression of cancer. If the oncologist and [name] decide they will pursue a order for hospice. If not will continue to follow with palliative care. Questions answered satisfaction. Contact information provided.  I spent 75 minutes providing this consultation,  From 2:00pm to 3:15pm. More than 50% of the time in this consultation was spent coordinating communication.   HISTORY OF PRESENT ILLNESS:  [name] is a 85  y.o. year old female with multiple medical problems including Coronary artery disease, bicuspid aortic valve with severe aortic valve stenosis s/p TAVR, severe hypertrophic cardiomyopathy s/p alchol septal ablation 11/4194, COPD, diastolic congestive heart failure, urothelial carcinoma of bladder, breast cancer s/p lumpectomy/ radiation, Late-onset CVA 8 / 8 / 2013, pulmonary hypertension, peripheral vascular disease, O2 dependents, chronic respiratory failure with hypoxia and hypercapnia, interstitial lung disease, hypothyroidism s/p thyroidectomy, hypertension, heart murmur,  chronic edema, stenosis of carotid artery s/p left CEA 3 / 2011 with right 60 to 79% and left 40 to 59%, anemia, GI bleed, GERD,  iron deficiency anemia secondary to Chronic blood loss, , allergies, arthritis, asthma, osteoporosis, cataract extraction with intraocular lens implant, appendectomy. 6 / 3 / 2019 office visit for follow up s/p TAVR with concern for bilateral lower extremity edema mostly involving feet. She is followed by Dr Burr Medico at [facility] for her anemia and iron infusions with last being 11 / 25 / 2019. Last night's from [facility] for hospitalization 2/7 / 2020 to 10/27/2018 for COPD exacerbation and chronic respiratory failure with hypercapnia and hypoxia. She is on Trilogy at home. She was placed on BiPAP and supplemental O2. COPD exacerbation treated with steroids and course of Doxycycline with  inhalation therapy to continue Trilogy. Chronic diastolic congestive heart failure with history of HOCM s/p correction procedure continued home diuretics and metoprolol. T e was completed 2 / 8 / 2020 showing EF greater 55% with moderate LVH severe dilated left atrium. It was recommended that she discharged home with hospice.  Per documentation [name] was born and raised in [place] and still lives in the area. She never married and never had children but raised her niece Su Hoff until she was 89  years old. She work for [employer] and retired around 2011. She was living with her siblings in her current home until they died a few years ago. She's had a functional decline, worsening shortness of breath with ambulating and incontinence of stool and urine which at the time she was receiving treatment for bladder cancer. That has stopped. Per ACP note from 2/10 / 2020 summary was severe COPD and heart disease requiring supplemental oxygen and shortness of breath with minimal exertion with desaturation at home requiring 4 to 5 Leaders with ambulation. Chronic hypercarbic respiratory failure requiring what was BiPAP transition to Trilogy at night. She was a high risk of not being able to be extubated safely in the setting of resuscitation and high risk for overall functional decline. She clearly stated for documentation DNR, do not intubate is consistent with her goals and avoid rehospitalizations. Hospice was introduced at this time. She was discharged home. Since she has been at home she continues to decline. She had one ER admission for blood found in the bed and thought to be possibly worsening of cancer. Palliative Care was asked to help address goals of care.   CODE STATUS: DNR  HOSPICE ELIGIBILITY/DIAGNOSIS: appears <6 months life expectancy  PAST MEDICAL HISTORY:  Past Medical History:  Diagnosis Date   Allergic rhinitis    Anemia 04/23/2012   "severe @ times; think caused by colonic AVMs"   Anginal pain ([place])    "pressure"   Arthritis    "probably"   Asthma    Breast cancer ([place])    CHF (congestive heart failure) (HCC)    COPD (chronic obstructive pulmonary disease) (H[facility])    Coronary atherosclerosis of native coronary artery    Cath 1/11: RCA 50-60 ostial o/w normal. EF 70%.    Dyspnea 04/23/2012   "all the time"   Edema    Emphysema    Emphysema    Gastric AVM    H/O hiatal hernia    Heart murmur    History of blood transfusion    "many times"    Hypertension    Hypertr obst cardiomyop    Echo 10/10 EF 65-70% SW 1.6 PW 1.4 mild SAM no LVOT gradient at rest. Valsalva not performed Mild MR. No hyperenhance ment on MRI EF 69%.   Hypothyroidism    Occlusion and stenosis of carotid artery    s/p L CEA u/s 3/11. R 60-79%; L 40-59%. no change since 3/10   Oxygen dependent 04/23/2012   "concentrated at night; liquid during day"   Peripheral vascular disease, unspecified ([place])    Pneumonia    Pulmonary hypertension ([place])    Cath 1/11 RA 8, RV 43/6/14, PA 42/17 (29) PCWP 15 Fick  5.0/2.6. PVR 2.8     Stroke [facility]) ~ 2004   denies residual on 04/23/2012    SOCIAL HX:  Social History   Tobacco Use   Smoking status: Former Smoker    Packs/day: 1.00    Years: 30.00  Pack years: 30.00    Types: Cigarettes    Last attempt to quit: 09/16/1997    Years since quitting: 21.2   Smokeless tobacco: Never Used  Substance Use Topics   Alcohol use: No    ALLERGIES:  Allergies  Allergen Reactions   Latex Hives and Itching     PERTINENT MEDICATIONS:  Outpatient Encounter Medications as of 12/15/2018  Medication Sig   albuterol (PROVENTIL) (2.5 MG/3ML) 0.083% nebulizer solution USE ONE VIAL IN NEBULIZER THREE TIMES DAILY AS NEEDED.   ALPRAZolam (XANAX) 0.5 MG tablet Take 1 tablet (0.5 mg total) by mouth at bedtime as needed for anxiety or sleep. (Patient not taking: Reported on 11/12/2018)   aspirin EC 81 MG tablet Take 81 mg by mouth daily.   cholecalciferol (VITAMIN D) 1000 units tablet Take 1,000 Units by mouth daily.   dextromethorphan (DELSYM) 30 MG/5ML liquid Take 30 mg by mouth at bedtime as needed for cough.    escitalopram (LEXAPRO) 20 MG tablet Take 20 mg by mouth daily.    ferrous sulfate 325 (65 FE) MG tablet Take 325 mg by mouth daily with breakfast.   fluticasone (FLONASE) 50 MCG/ACT nasal spray Place 2 sprays into both nostrils daily.   HYDROcodone-homatropine (HYCODAN) 5-1.5 MG/5ML syrup Take 5 mLs by mouth  every 6 (six) hours as needed for cough.   INCRUSE ELLIPTA 62.5 MCG/INH AEPB INHALE 1 PUFF BY MOUTH ONCE DAILY AT BEDTIME.   ipratropium-albuterol (DUONEB) 0.5-2.5 (3) MG/3ML SOLN Inhale 3 mL by nebulization every six (6) hours as needed.   levothyroxine (SYNTHROID, LEVOTHROID) 150 MCG tablet Take 150 mcg by mouth daily before breakfast.    loratadine (CLARITIN) 10 MG tablet Take 10 mg by mouth daily.   Melatonin 3 MG TABS Take 3 mg by mouth.   metoprolol tartrate (LOPRESSOR) 25 MG tablet Take 12.5 mg by mouth 2 (two) times daily.    Multiple Vitamin (MULTIVITAMIN WITH MINERALS) TABS tablet Take 1 tablet by mouth daily.   pantoprazole (PROTONIX) 40 MG tablet Take 1 tablet (40 mg total) by mouth 2 (two) times daily.   polyethylene glycol (MIRALAX / GLYCOLAX) packet Take 17 g by mouth daily as needed for mild constipation.   potassium chloride SA (K-DUR,KLOR-CON) 20 MEQ tablet Take 20 mEq by mouth 2 (two) times daily.   predniSONE (DELTASONE) 5 MG tablet Take 5 mg by mouth daily with breakfast.   senna (SENOKOT) 8.6 MG TABS tablet Take 1 tablet by mouth at bedtime.   SYMBICORT 160-4.5 MCG/ACT inhaler INHALE 2 PUFFS BY MOUTH TWICE DAILY   tolterodine (DETROL LA) 2 MG 24 hr capsule Take 2 mg by mouth 2 (two) times daily.    torsemide (DEMADEX) 20 MG tablet Take 20 mg by mouth 2 (two) times daily.   traZODone (DESYREL) 100 MG tablet Take 100 mg by mouth at bedtime.    VENTOLIN HFA 108 (90 Base) MCG/ACT inhaler INHALE 2 PUFFS BY MOUTH EVERY 6 HOURS FOR SHORTNESS OF BREATH OR WHEEZING.   No facility-administered encounter medications on file as of 12/15/2018.     PHYSICAL EXAM:   Telephone visit as Telehealth connection was not successful Merilyn Pagan Ihor Gully, NP

## 2018-12-16 ENCOUNTER — Encounter: Payer: Self-pay | Admitting: Nurse Practitioner

## 2018-12-16 ENCOUNTER — Other Ambulatory Visit: Payer: Self-pay

## 2018-12-17 ENCOUNTER — Encounter: Payer: Self-pay | Admitting: Nurse Practitioner

## 2019-03-16 ENCOUNTER — Other Ambulatory Visit: Payer: Self-pay | Admitting: Emergency Medicine

## 2019-03-16 DIAGNOSIS — J441 Chronic obstructive pulmonary disease with (acute) exacerbation: Secondary | ICD-10-CM

## 2019-04-17 DEATH — deceased

## 2019-06-10 ENCOUNTER — Inpatient Hospital Stay: Payer: Medicare Other | Admitting: Hematology

## 2019-06-10 ENCOUNTER — Inpatient Hospital Stay: Payer: Medicare Other
